# Patient Record
Sex: Male | Born: 1940 | Race: White | Hispanic: No | Marital: Married | Smoking: Former smoker
Health system: Southern US, Community
[De-identification: ages and names within clinical notes are randomized; demographics above are authoritative.]

## PROBLEM LIST (undated history)

## (undated) DIAGNOSIS — K219 Gastro-esophageal reflux disease without esophagitis: Secondary | ICD-10-CM

## (undated) DIAGNOSIS — R0989 Other specified symptoms and signs involving the circulatory and respiratory systems: Secondary | ICD-10-CM

## (undated) DIAGNOSIS — I2510 Atherosclerotic heart disease of native coronary artery without angina pectoris: Principal | ICD-10-CM

## (undated) DIAGNOSIS — Z9289 Personal history of other medical treatment: Secondary | ICD-10-CM

## (undated) DIAGNOSIS — I252 Old myocardial infarction: Secondary | ICD-10-CM

## (undated) DIAGNOSIS — H269 Unspecified cataract: Secondary | ICD-10-CM

## (undated) DIAGNOSIS — E785 Hyperlipidemia, unspecified: Secondary | ICD-10-CM

## (undated) DIAGNOSIS — M1990 Unspecified osteoarthritis, unspecified site: Secondary | ICD-10-CM

## (undated) DIAGNOSIS — C801 Malignant (primary) neoplasm, unspecified: Secondary | ICD-10-CM

## (undated) DIAGNOSIS — K635 Polyp of colon: Secondary | ICD-10-CM

## (undated) DIAGNOSIS — J45909 Unspecified asthma, uncomplicated: Secondary | ICD-10-CM

## (undated) DIAGNOSIS — K222 Esophageal obstruction: Secondary | ICD-10-CM

## (undated) DIAGNOSIS — K649 Unspecified hemorrhoids: Secondary | ICD-10-CM

## (undated) DIAGNOSIS — I10 Essential (primary) hypertension: Secondary | ICD-10-CM

## (undated) DIAGNOSIS — I219 Acute myocardial infarction, unspecified: Secondary | ICD-10-CM

## (undated) HISTORY — DX: Unspecified asthma, uncomplicated: J45.909

## (undated) HISTORY — PX: LUNG SURGERY: SHX703

## (undated) HISTORY — PX: POLYPECTOMY: SHX149

## (undated) HISTORY — DX: Acute myocardial infarction, unspecified: I21.9

## (undated) HISTORY — DX: Atherosclerotic heart disease of native coronary artery without angina pectoris: I25.10

## (undated) HISTORY — DX: Old myocardial infarction: I25.2

## (undated) HISTORY — DX: Esophageal obstruction: K22.2

## (undated) HISTORY — DX: Unspecified hemorrhoids: K64.9

## (undated) HISTORY — DX: Unspecified cataract: H26.9

## (undated) HISTORY — DX: Polyp of colon: K63.5

## (undated) HISTORY — PX: ANGIOPLASTY: SHX39

## (undated) HISTORY — DX: Hyperlipidemia, unspecified: E78.5

## (undated) HISTORY — PX: COLONOSCOPY: SHX174

## (undated) HISTORY — DX: Other specified symptoms and signs involving the circulatory and respiratory systems: R09.89

## (undated) HISTORY — DX: Gastro-esophageal reflux disease without esophagitis: K21.9

## (undated) HISTORY — DX: Personal history of other medical treatment: Z92.89

---

## 1997-07-07 DIAGNOSIS — I2510 Atherosclerotic heart disease of native coronary artery without angina pectoris: Secondary | ICD-10-CM

## 1997-07-07 HISTORY — DX: Atherosclerotic heart disease of native coronary artery without angina pectoris: I25.10

## 1998-02-18 ENCOUNTER — Inpatient Hospital Stay (HOSPITAL_COMMUNITY): Admission: EM | Admit: 1998-02-18 | Discharge: 1998-02-26 | Payer: Self-pay | Admitting: *Deleted

## 1998-02-18 DIAGNOSIS — I219 Acute myocardial infarction, unspecified: Secondary | ICD-10-CM

## 1998-02-18 DIAGNOSIS — I252 Old myocardial infarction: Secondary | ICD-10-CM

## 1998-02-18 HISTORY — PX: CORONARY ANGIOPLASTY WITH STENT PLACEMENT: SHX49

## 1998-02-18 HISTORY — DX: Acute myocardial infarction, unspecified: I21.9

## 1998-02-18 HISTORY — DX: Old myocardial infarction: I25.2

## 2004-05-01 ENCOUNTER — Encounter: Payer: Self-pay | Admitting: Internal Medicine

## 2005-06-18 ENCOUNTER — Ambulatory Visit: Payer: Self-pay | Admitting: Internal Medicine

## 2006-07-07 DIAGNOSIS — Z9289 Personal history of other medical treatment: Secondary | ICD-10-CM

## 2006-07-07 HISTORY — DX: Personal history of other medical treatment: Z92.89

## 2006-07-27 ENCOUNTER — Ambulatory Visit: Payer: Self-pay | Admitting: Internal Medicine

## 2007-03-16 ENCOUNTER — Emergency Department (HOSPITAL_COMMUNITY): Admission: EM | Admit: 2007-03-16 | Discharge: 2007-03-16 | Payer: Self-pay | Admitting: Emergency Medicine

## 2007-09-22 ENCOUNTER — Ambulatory Visit: Payer: Self-pay | Admitting: Internal Medicine

## 2008-08-04 ENCOUNTER — Encounter: Payer: Self-pay | Admitting: Pulmonary Disease

## 2008-09-04 DIAGNOSIS — K219 Gastro-esophageal reflux disease without esophagitis: Secondary | ICD-10-CM

## 2008-09-04 DIAGNOSIS — K222 Esophageal obstruction: Secondary | ICD-10-CM

## 2008-09-04 DIAGNOSIS — D126 Benign neoplasm of colon, unspecified: Secondary | ICD-10-CM

## 2008-09-04 DIAGNOSIS — K648 Other hemorrhoids: Secondary | ICD-10-CM | POA: Insufficient documentation

## 2008-09-05 ENCOUNTER — Ambulatory Visit: Payer: Self-pay | Admitting: Internal Medicine

## 2008-09-05 DIAGNOSIS — Z860100 Personal history of colon polyps, unspecified: Secondary | ICD-10-CM | POA: Insufficient documentation

## 2008-09-13 ENCOUNTER — Encounter: Payer: Self-pay | Admitting: Pulmonary Disease

## 2008-09-21 ENCOUNTER — Ambulatory Visit: Payer: Self-pay | Admitting: Internal Medicine

## 2008-09-21 ENCOUNTER — Encounter: Payer: Self-pay | Admitting: Internal Medicine

## 2008-09-25 ENCOUNTER — Encounter: Payer: Self-pay | Admitting: Internal Medicine

## 2008-10-10 ENCOUNTER — Ambulatory Visit: Payer: Self-pay | Admitting: Pulmonary Disease

## 2008-10-10 DIAGNOSIS — I219 Acute myocardial infarction, unspecified: Secondary | ICD-10-CM

## 2008-10-10 DIAGNOSIS — I10 Essential (primary) hypertension: Secondary | ICD-10-CM

## 2008-10-10 DIAGNOSIS — E785 Hyperlipidemia, unspecified: Secondary | ICD-10-CM | POA: Insufficient documentation

## 2008-10-16 ENCOUNTER — Ambulatory Visit (HOSPITAL_COMMUNITY): Admission: RE | Admit: 2008-10-16 | Discharge: 2008-10-16 | Payer: Self-pay | Admitting: Pulmonary Disease

## 2008-10-23 ENCOUNTER — Ambulatory Visit: Payer: Self-pay | Admitting: Pulmonary Disease

## 2008-10-23 ENCOUNTER — Telehealth: Payer: Self-pay | Admitting: Gastroenterology

## 2008-10-27 ENCOUNTER — Telehealth (INDEPENDENT_AMBULATORY_CARE_PROVIDER_SITE_OTHER): Payer: Self-pay | Admitting: *Deleted

## 2008-10-27 ENCOUNTER — Encounter: Payer: Self-pay | Admitting: Gastroenterology

## 2008-10-27 ENCOUNTER — Ambulatory Visit: Payer: Self-pay | Admitting: Pulmonary Disease

## 2008-10-27 DIAGNOSIS — J4489 Other specified chronic obstructive pulmonary disease: Secondary | ICD-10-CM | POA: Insufficient documentation

## 2008-10-27 DIAGNOSIS — J449 Chronic obstructive pulmonary disease, unspecified: Secondary | ICD-10-CM | POA: Insufficient documentation

## 2008-11-13 ENCOUNTER — Ambulatory Visit: Payer: Self-pay | Admitting: Gastroenterology

## 2008-11-16 ENCOUNTER — Ambulatory Visit (HOSPITAL_COMMUNITY): Admission: RE | Admit: 2008-11-16 | Discharge: 2008-11-16 | Payer: Self-pay | Admitting: Gastroenterology

## 2008-11-16 ENCOUNTER — Encounter: Payer: Self-pay | Admitting: Gastroenterology

## 2008-11-16 ENCOUNTER — Ambulatory Visit: Payer: Self-pay | Admitting: Gastroenterology

## 2008-11-20 ENCOUNTER — Ambulatory Visit: Payer: Self-pay | Admitting: Internal Medicine

## 2008-11-23 ENCOUNTER — Ambulatory Visit: Payer: Self-pay | Admitting: Internal Medicine

## 2008-11-23 ENCOUNTER — Encounter: Payer: Self-pay | Admitting: Pulmonary Disease

## 2008-11-24 LAB — COMPREHENSIVE METABOLIC PANEL
ALT: 18 U/L (ref 0–53)
CO2: 18 mEq/L — ABNORMAL LOW (ref 19–32)
Calcium: 9.2 mg/dL (ref 8.4–10.5)
Chloride: 107 mEq/L (ref 96–112)
Potassium: 3.5 mEq/L (ref 3.5–5.3)
Sodium: 140 mEq/L (ref 135–145)
Total Protein: 7.5 g/dL (ref 6.0–8.3)

## 2008-11-24 LAB — CBC WITH DIFFERENTIAL/PLATELET
BASO%: 0.1 % (ref 0.0–2.0)
HCT: 40.5 % (ref 38.4–49.9)
MCHC: 34.3 g/dL (ref 32.0–36.0)
MONO#: 0.4 10*3/uL (ref 0.1–0.9)
RBC: 4.75 10*6/uL (ref 4.20–5.82)
WBC: 4.2 10*3/uL (ref 4.0–10.3)
lymph#: 1.5 10*3/uL (ref 0.9–3.3)

## 2008-11-26 ENCOUNTER — Ambulatory Visit (HOSPITAL_COMMUNITY): Admission: RE | Admit: 2008-11-26 | Discharge: 2008-11-26 | Payer: Self-pay | Admitting: Internal Medicine

## 2008-12-05 ENCOUNTER — Ambulatory Visit: Payer: Self-pay | Admitting: Pulmonary Disease

## 2008-12-05 DIAGNOSIS — C342 Malignant neoplasm of middle lobe, bronchus or lung: Secondary | ICD-10-CM | POA: Insufficient documentation

## 2008-12-06 LAB — COMPREHENSIVE METABOLIC PANEL
Albumin: 4 g/dL (ref 3.5–5.2)
CO2: 25 mEq/L (ref 19–32)
Glucose, Bld: 107 mg/dL — ABNORMAL HIGH (ref 70–99)
Sodium: 135 mEq/L (ref 135–145)
Total Bilirubin: 0.9 mg/dL (ref 0.3–1.2)
Total Protein: 7 g/dL (ref 6.0–8.3)

## 2008-12-06 LAB — CBC WITH DIFFERENTIAL/PLATELET
Basophils Absolute: 0 10*3/uL (ref 0.0–0.1)
Eosinophils Absolute: 0.1 10*3/uL (ref 0.0–0.5)
HCT: 37.3 % — ABNORMAL LOW (ref 38.4–49.9)
HGB: 12.9 g/dL — ABNORMAL LOW (ref 13.0–17.1)
LYMPH%: 35.7 % (ref 14.0–49.0)
MONO#: 0.3 10*3/uL (ref 0.1–0.9)
NEUT#: 2.3 10*3/uL (ref 1.5–6.5)
Platelets: 169 10*3/uL (ref 140–400)
RBC: 4.42 10*6/uL (ref 4.20–5.82)
WBC: 4.4 10*3/uL (ref 4.0–10.3)

## 2008-12-13 LAB — CBC WITH DIFFERENTIAL/PLATELET
Eosinophils Absolute: 0 10*3/uL (ref 0.0–0.5)
MONO#: 0.4 10*3/uL (ref 0.1–0.9)
NEUT#: 0.9 10*3/uL — ABNORMAL LOW (ref 1.5–6.5)
RBC: 4.39 10*6/uL (ref 4.20–5.82)
RDW: 13.5 % (ref 11.0–14.6)
WBC: 2.2 10*3/uL — ABNORMAL LOW (ref 4.0–10.3)

## 2008-12-13 LAB — COMPREHENSIVE METABOLIC PANEL
Albumin: 4.2 g/dL (ref 3.5–5.2)
CO2: 27 mEq/L (ref 19–32)
Glucose, Bld: 108 mg/dL — ABNORMAL HIGH (ref 70–99)
Potassium: 4.4 mEq/L (ref 3.5–5.3)
Sodium: 140 mEq/L (ref 135–145)
Total Protein: 7.3 g/dL (ref 6.0–8.3)

## 2008-12-20 ENCOUNTER — Encounter: Payer: Self-pay | Admitting: Pulmonary Disease

## 2008-12-20 LAB — CBC WITH DIFFERENTIAL/PLATELET
BASO%: 0.1 % (ref 0.0–2.0)
Eosinophils Absolute: 0 10*3/uL (ref 0.0–0.5)
MCV: 83.8 fL (ref 79.3–98.0)
MONO#: 0.5 10*3/uL (ref 0.1–0.9)
MONO%: 6.3 % (ref 0.0–14.0)
NEUT#: 6.3 10*3/uL (ref 1.5–6.5)
RBC: 4.21 10*6/uL (ref 4.20–5.82)
RDW: 14.4 % (ref 11.0–14.6)
WBC: 7.9 10*3/uL (ref 4.0–10.3)

## 2008-12-20 LAB — COMPREHENSIVE METABOLIC PANEL
ALT: 19 U/L (ref 0–53)
AST: 15 U/L (ref 0–37)
CO2: 19 mEq/L (ref 19–32)
Chloride: 107 mEq/L (ref 96–112)
Creatinine, Ser: 0.89 mg/dL (ref 0.40–1.50)
Sodium: 138 mEq/L (ref 135–145)
Total Bilirubin: 0.4 mg/dL (ref 0.3–1.2)
Total Protein: 7.1 g/dL (ref 6.0–8.3)

## 2008-12-20 LAB — TECHNOLOGIST REVIEW

## 2008-12-27 LAB — COMPREHENSIVE METABOLIC PANEL
ALT: 26 U/L (ref 0–53)
AST: 30 U/L (ref 0–37)
Alkaline Phosphatase: 74 U/L (ref 39–117)
CO2: 19 mEq/L (ref 19–32)
Creatinine, Ser: 2.33 mg/dL — ABNORMAL HIGH (ref 0.40–1.50)
Sodium: 136 mEq/L (ref 135–145)
Total Bilirubin: 1.1 mg/dL (ref 0.3–1.2)
Total Protein: 7.2 g/dL (ref 6.0–8.3)

## 2008-12-27 LAB — CBC WITH DIFFERENTIAL/PLATELET
BASO%: 0.8 % (ref 0.0–2.0)
EOS%: 0.7 % (ref 0.0–7.0)
HCT: 34.1 % — ABNORMAL LOW (ref 38.4–49.9)
LYMPH%: 46.5 % (ref 14.0–49.0)
MCH: 30.8 pg (ref 27.2–33.4)
MCHC: 36.6 g/dL — ABNORMAL HIGH (ref 32.0–36.0)
MCV: 84.2 fL (ref 79.3–98.0)
MONO%: 6.2 % (ref 0.0–14.0)
NEUT%: 45.8 % (ref 39.0–75.0)
Platelets: 140 10*3/uL (ref 140–400)
RBC: 4.05 10*6/uL — ABNORMAL LOW (ref 4.20–5.82)
WBC: 2.9 10*3/uL — ABNORMAL LOW (ref 4.0–10.3)

## 2008-12-28 ENCOUNTER — Encounter: Payer: Self-pay | Admitting: Pulmonary Disease

## 2008-12-29 LAB — COMPREHENSIVE METABOLIC PANEL
BUN: 22 mg/dL (ref 6–23)
CO2: 23 mEq/L (ref 19–32)
Glucose, Bld: 121 mg/dL — ABNORMAL HIGH (ref 70–99)
Sodium: 137 mEq/L (ref 135–145)
Total Bilirubin: 1.1 mg/dL (ref 0.3–1.2)
Total Protein: 6.4 g/dL (ref 6.0–8.3)

## 2009-01-03 ENCOUNTER — Ambulatory Visit: Payer: Self-pay | Admitting: Internal Medicine

## 2009-01-03 LAB — CBC WITH DIFFERENTIAL/PLATELET
Basophils Absolute: 0 10*3/uL (ref 0.0–0.1)
Eosinophils Absolute: 0.1 10*3/uL (ref 0.0–0.5)
HGB: 10.5 g/dL — ABNORMAL LOW (ref 13.0–17.1)
LYMPH%: 60.2 % — ABNORMAL HIGH (ref 14.0–49.0)
MONO#: 0.3 10*3/uL (ref 0.1–0.9)
NEUT#: 0.4 10*3/uL — CL (ref 1.5–6.5)
Platelets: 79 10*3/uL — ABNORMAL LOW (ref 140–400)
RBC: 3.63 10*6/uL — ABNORMAL LOW (ref 4.20–5.82)
RDW: 14.3 % (ref 11.0–14.6)
WBC: 1.8 10*3/uL — ABNORMAL LOW (ref 4.0–10.3)
nRBC: 0 % (ref 0–0)

## 2009-01-03 LAB — COMPREHENSIVE METABOLIC PANEL
ALT: 27 U/L (ref 0–53)
CO2: 24 mEq/L (ref 19–32)
Calcium: 8.5 mg/dL (ref 8.4–10.5)
Chloride: 104 mEq/L (ref 96–112)
Creatinine, Ser: 1.04 mg/dL (ref 0.40–1.50)
Total Protein: 6.3 g/dL (ref 6.0–8.3)

## 2009-01-10 LAB — COMPREHENSIVE METABOLIC PANEL
ALT: 21 U/L (ref 0–53)
AST: 17 U/L (ref 0–37)
Albumin: 4.4 g/dL (ref 3.5–5.2)
Calcium: 9.5 mg/dL (ref 8.4–10.5)
Chloride: 105 mEq/L (ref 96–112)
Potassium: 3.9 mEq/L (ref 3.5–5.3)

## 2009-01-10 LAB — CBC WITH DIFFERENTIAL/PLATELET
BASO%: 0.1 % (ref 0.0–2.0)
MCHC: 34.5 g/dL (ref 32.0–36.0)
MONO#: 0.8 10*3/uL (ref 0.1–0.9)
RBC: 3.75 10*6/uL — ABNORMAL LOW (ref 4.20–5.82)
RDW: 15.5 % — ABNORMAL HIGH (ref 11.0–14.6)
WBC: 7.3 10*3/uL (ref 4.0–10.3)
lymph#: 0.9 10*3/uL (ref 0.9–3.3)

## 2009-01-10 LAB — TECHNOLOGIST REVIEW

## 2009-01-17 LAB — COMPREHENSIVE METABOLIC PANEL
ALT: 17 U/L (ref 0–53)
AST: 17 U/L (ref 0–37)
Alkaline Phosphatase: 73 U/L (ref 39–117)
Calcium: 9.5 mg/dL (ref 8.4–10.5)
Chloride: 101 mEq/L (ref 96–112)
Creatinine, Ser: 1.72 mg/dL — ABNORMAL HIGH (ref 0.40–1.50)
Total Bilirubin: 1 mg/dL (ref 0.3–1.2)

## 2009-01-17 LAB — CBC WITH DIFFERENTIAL/PLATELET
BASO%: 0.8 % (ref 0.0–2.0)
EOS%: 0.4 % (ref 0.0–7.0)
HCT: 32.5 % — ABNORMAL LOW (ref 38.4–49.9)
MCH: 30.4 pg (ref 27.2–33.4)
MCHC: 35.6 g/dL (ref 32.0–36.0)
MCV: 85.6 fL (ref 79.3–98.0)
MONO%: 6.6 % (ref 0.0–14.0)
NEUT%: 46.7 % (ref 39.0–75.0)
lymph#: 1.2 10*3/uL (ref 0.9–3.3)

## 2009-01-24 LAB — COMPREHENSIVE METABOLIC PANEL
ALT: 16 U/L (ref 0–53)
AST: 16 U/L (ref 0–37)
BUN: 17 mg/dL (ref 6–23)
Calcium: 9.1 mg/dL (ref 8.4–10.5)
Creatinine, Ser: 1.31 mg/dL (ref 0.40–1.50)
Total Bilirubin: 0.6 mg/dL (ref 0.3–1.2)

## 2009-01-24 LAB — CBC WITH DIFFERENTIAL/PLATELET
BASO%: 0.3 % (ref 0.0–2.0)
Basophils Absolute: 0 10*3/uL (ref 0.0–0.1)
EOS%: 1.7 % (ref 0.0–7.0)
HCT: 29.6 % — ABNORMAL LOW (ref 38.4–49.9)
HGB: 10.4 g/dL — ABNORMAL LOW (ref 13.0–17.1)
LYMPH%: 58.1 % — ABNORMAL HIGH (ref 14.0–49.0)
MCH: 30.7 pg (ref 27.2–33.4)
MCHC: 35.3 g/dL (ref 32.0–36.0)
MCV: 86.9 fL (ref 79.3–98.0)
MONO%: 10.4 % (ref 0.0–14.0)
NEUT%: 29.5 % — ABNORMAL LOW (ref 39.0–75.0)
Platelets: 66 10*3/uL — ABNORMAL LOW (ref 140–400)

## 2009-01-26 ENCOUNTER — Ambulatory Visit (HOSPITAL_COMMUNITY): Admission: RE | Admit: 2009-01-26 | Discharge: 2009-01-26 | Payer: Self-pay | Admitting: Internal Medicine

## 2009-01-26 ENCOUNTER — Ambulatory Visit: Payer: Self-pay | Admitting: Internal Medicine

## 2009-01-30 ENCOUNTER — Encounter: Payer: Self-pay | Admitting: Pulmonary Disease

## 2009-01-30 LAB — CBC WITH DIFFERENTIAL/PLATELET
BASO%: 0.5 % (ref 0.0–2.0)
Basophils Absolute: 0 10*3/uL (ref 0.0–0.1)
EOS%: 1 % (ref 0.0–7.0)
HCT: 30.4 % — ABNORMAL LOW (ref 38.4–49.9)
HGB: 10.6 g/dL — ABNORMAL LOW (ref 13.0–17.1)
LYMPH%: 28.9 % (ref 14.0–49.0)
MCH: 30.6 pg (ref 27.2–33.4)
MCHC: 34.8 g/dL (ref 32.0–36.0)
MONO#: 0.2 10*3/uL (ref 0.1–0.9)
NEUT%: 58.5 % (ref 39.0–75.0)
Platelets: 176 10*3/uL (ref 140–400)
lymph#: 0.6 10*3/uL — ABNORMAL LOW (ref 0.9–3.3)

## 2009-01-30 LAB — COMPREHENSIVE METABOLIC PANEL
BUN: 18 mg/dL (ref 6–23)
CO2: 22 mEq/L (ref 19–32)
Calcium: 9.5 mg/dL (ref 8.4–10.5)
Chloride: 105 mEq/L (ref 96–112)
Creatinine, Ser: 1.13 mg/dL (ref 0.40–1.50)
Total Bilirubin: 0.5 mg/dL (ref 0.3–1.2)

## 2009-02-02 ENCOUNTER — Ambulatory Visit (HOSPITAL_COMMUNITY): Admission: RE | Admit: 2009-02-02 | Discharge: 2009-02-02 | Payer: Self-pay | Admitting: Internal Medicine

## 2009-02-07 ENCOUNTER — Encounter: Payer: Self-pay | Admitting: Pulmonary Disease

## 2009-02-07 LAB — CBC WITH DIFFERENTIAL/PLATELET
BASO%: 0.7 % (ref 0.0–2.0)
HCT: 31.2 % — ABNORMAL LOW (ref 38.4–49.9)
MCHC: 34.9 g/dL (ref 32.0–36.0)
MONO#: 0.7 10*3/uL (ref 0.1–0.9)
NEUT%: 53.9 % (ref 39.0–75.0)
WBC: 4.1 10*3/uL (ref 4.0–10.3)
lymph#: 1.2 10*3/uL (ref 0.9–3.3)

## 2009-02-07 LAB — COMPREHENSIVE METABOLIC PANEL
ALT: 13 U/L (ref 0–53)
Albumin: 4.1 g/dL (ref 3.5–5.2)
CO2: 21 mEq/L (ref 19–32)
Calcium: 9.2 mg/dL (ref 8.4–10.5)
Chloride: 106 mEq/L (ref 96–112)
Creatinine, Ser: 1.11 mg/dL (ref 0.40–1.50)
Sodium: 138 mEq/L (ref 135–145)
Total Protein: 6.9 g/dL (ref 6.0–8.3)

## 2009-02-13 ENCOUNTER — Ambulatory Visit: Payer: Self-pay | Admitting: Thoracic Surgery

## 2009-03-05 ENCOUNTER — Encounter: Payer: Self-pay | Admitting: Thoracic Surgery

## 2009-03-05 ENCOUNTER — Inpatient Hospital Stay (HOSPITAL_COMMUNITY): Admission: RE | Admit: 2009-03-05 | Discharge: 2009-03-09 | Payer: Self-pay | Admitting: Thoracic Surgery

## 2009-03-05 ENCOUNTER — Ambulatory Visit: Payer: Self-pay | Admitting: Thoracic Surgery

## 2009-03-14 ENCOUNTER — Ambulatory Visit: Payer: Self-pay | Admitting: Thoracic Surgery

## 2009-03-14 ENCOUNTER — Encounter: Admission: RE | Admit: 2009-03-14 | Discharge: 2009-03-14 | Payer: Self-pay | Admitting: Thoracic Surgery

## 2009-04-02 ENCOUNTER — Ambulatory Visit: Payer: Self-pay | Admitting: Internal Medicine

## 2009-04-04 ENCOUNTER — Ambulatory Visit: Payer: Self-pay | Admitting: Thoracic Surgery

## 2009-04-04 ENCOUNTER — Encounter: Payer: Self-pay | Admitting: Pulmonary Disease

## 2009-04-04 ENCOUNTER — Encounter: Admission: RE | Admit: 2009-04-04 | Discharge: 2009-04-04 | Payer: Self-pay | Admitting: Thoracic Surgery

## 2009-04-04 LAB — COMPREHENSIVE METABOLIC PANEL
ALT: 11 U/L (ref 0–53)
BUN: 15 mg/dL (ref 6–23)
CO2: 27 mEq/L (ref 19–32)
Calcium: 9.4 mg/dL (ref 8.4–10.5)
Chloride: 103 mEq/L (ref 96–112)
Creatinine, Ser: 1.06 mg/dL (ref 0.40–1.50)
Glucose, Bld: 103 mg/dL — ABNORMAL HIGH (ref 70–99)

## 2009-04-04 LAB — CBC WITH DIFFERENTIAL/PLATELET
BASO%: 0.8 % (ref 0.0–2.0)
Basophils Absolute: 0 10e3/uL (ref 0.0–0.1)
EOS%: 5.4 % (ref 0.0–7.0)
Eosinophils Absolute: 0.2 10e3/uL (ref 0.0–0.5)
HCT: 31.9 % — ABNORMAL LOW (ref 38.4–49.9)
HGB: 10.9 g/dL — ABNORMAL LOW (ref 13.0–17.1)
LYMPH%: 31.1 % (ref 14.0–49.0)
MCH: 30.6 pg (ref 27.2–33.4)
MCHC: 34 g/dL (ref 32.0–36.0)
MCV: 89.9 fL (ref 79.3–98.0)
MONO#: 0.5 10e3/uL (ref 0.1–0.9)
MONO%: 15.1 % — ABNORMAL HIGH (ref 0.0–14.0)
NEUT#: 1.5 10e3/uL (ref 1.5–6.5)
NEUT%: 47.6 % (ref 39.0–75.0)
Platelets: 180 10e3/uL (ref 140–400)
RBC: 3.55 10e6/uL — ABNORMAL LOW (ref 4.20–5.82)
RDW: 15.2 % — ABNORMAL HIGH (ref 11.0–14.6)
WBC: 3.2 10e3/uL — ABNORMAL LOW (ref 4.0–10.3)
lymph#: 1 10e3/uL (ref 0.9–3.3)

## 2009-05-16 ENCOUNTER — Ambulatory Visit: Payer: Self-pay | Admitting: Thoracic Surgery

## 2009-05-16 ENCOUNTER — Encounter: Admission: RE | Admit: 2009-05-16 | Discharge: 2009-05-16 | Payer: Self-pay | Admitting: Thoracic Surgery

## 2009-06-27 ENCOUNTER — Ambulatory Visit: Payer: Self-pay | Admitting: Internal Medicine

## 2009-07-02 ENCOUNTER — Ambulatory Visit (HOSPITAL_COMMUNITY): Admission: RE | Admit: 2009-07-02 | Discharge: 2009-07-02 | Payer: Self-pay | Admitting: Internal Medicine

## 2009-07-02 LAB — CBC WITH DIFFERENTIAL/PLATELET
Basophils Absolute: 0 10*3/uL (ref 0.0–0.1)
EOS%: 3.7 % (ref 0.0–7.0)
HCT: 39.8 % (ref 38.4–49.9)
HGB: 13.4 g/dL (ref 13.0–17.1)
LYMPH%: 33.6 % (ref 14.0–49.0)
MCH: 28.6 pg (ref 27.2–33.4)
MONO#: 0.5 10*3/uL (ref 0.1–0.9)
NEUT%: 50.3 % (ref 39.0–75.0)
Platelets: 176 10*3/uL (ref 140–400)
lymph#: 1.4 10*3/uL (ref 0.9–3.3)

## 2009-07-02 LAB — COMPREHENSIVE METABOLIC PANEL
BUN: 14 mg/dL (ref 6–23)
CO2: 26 mEq/L (ref 19–32)
Calcium: 9.5 mg/dL (ref 8.4–10.5)
Chloride: 102 mEq/L (ref 96–112)
Creatinine, Ser: 1.19 mg/dL (ref 0.40–1.50)
Total Bilirubin: 0.6 mg/dL (ref 0.3–1.2)

## 2009-07-04 ENCOUNTER — Encounter: Payer: Self-pay | Admitting: Pulmonary Disease

## 2009-08-15 ENCOUNTER — Encounter: Admission: RE | Admit: 2009-08-15 | Discharge: 2009-08-15 | Payer: Self-pay | Admitting: Thoracic Surgery

## 2009-08-15 ENCOUNTER — Ambulatory Visit: Payer: Self-pay | Admitting: Thoracic Surgery

## 2009-09-05 ENCOUNTER — Ambulatory Visit: Payer: Self-pay | Admitting: Internal Medicine

## 2009-09-26 ENCOUNTER — Ambulatory Visit: Payer: Self-pay | Admitting: Internal Medicine

## 2009-09-28 ENCOUNTER — Ambulatory Visit (HOSPITAL_COMMUNITY): Admission: RE | Admit: 2009-09-28 | Discharge: 2009-09-28 | Payer: Self-pay | Admitting: Internal Medicine

## 2009-09-28 LAB — CBC WITH DIFFERENTIAL/PLATELET
BASO%: 0.9 % (ref 0.0–2.0)
EOS%: 3.5 % (ref 0.0–7.0)
MCH: 30.4 pg (ref 27.2–33.4)
MCHC: 34.7 g/dL (ref 32.0–36.0)
NEUT%: 53.6 % (ref 39.0–75.0)
RBC: 4.53 10*6/uL (ref 4.20–5.82)
RDW: 13.6 % (ref 11.0–14.6)
WBC: 4.4 10*3/uL (ref 4.0–10.3)
lymph#: 1.3 10*3/uL (ref 0.9–3.3)

## 2009-09-28 LAB — COMPREHENSIVE METABOLIC PANEL
ALT: 26 U/L (ref 0–53)
AST: 26 U/L (ref 0–37)
Calcium: 9.5 mg/dL (ref 8.4–10.5)
Chloride: 105 mEq/L (ref 96–112)
Creatinine, Ser: 1.05 mg/dL (ref 0.40–1.50)
Sodium: 140 mEq/L (ref 135–145)
Total Protein: 7.9 g/dL (ref 6.0–8.3)

## 2009-10-01 ENCOUNTER — Encounter: Payer: Self-pay | Admitting: Pulmonary Disease

## 2009-12-26 ENCOUNTER — Ambulatory Visit: Payer: Self-pay | Admitting: Internal Medicine

## 2009-12-28 ENCOUNTER — Ambulatory Visit (HOSPITAL_COMMUNITY): Admission: RE | Admit: 2009-12-28 | Discharge: 2009-12-28 | Payer: Self-pay | Admitting: Internal Medicine

## 2009-12-28 LAB — COMPREHENSIVE METABOLIC PANEL
ALT: 20 U/L (ref 0–53)
AST: 24 U/L (ref 0–37)
Albumin: 4.1 g/dL (ref 3.5–5.2)
CO2: 25 mEq/L (ref 19–32)
Calcium: 9.2 mg/dL (ref 8.4–10.5)
Chloride: 106 mEq/L (ref 96–112)
Potassium: 3.8 mEq/L (ref 3.5–5.3)
Total Protein: 7.7 g/dL (ref 6.0–8.3)

## 2009-12-28 LAB — CBC WITH DIFFERENTIAL/PLATELET
BASO%: 0.7 % (ref 0.0–2.0)
EOS%: 3.9 % (ref 0.0–7.0)
HCT: 37.9 % — ABNORMAL LOW (ref 38.4–49.9)
HGB: 13.1 g/dL (ref 13.0–17.1)
MCH: 29.8 pg (ref 27.2–33.4)
MCHC: 34.5 g/dL (ref 32.0–36.0)
MONO#: 0.5 10*3/uL (ref 0.1–0.9)
RDW: 13.9 % (ref 11.0–14.6)
WBC: 4.5 10*3/uL (ref 4.0–10.3)
lymph#: 1.3 10*3/uL (ref 0.9–3.3)

## 2010-01-15 ENCOUNTER — Ambulatory Visit: Payer: Self-pay | Admitting: Thoracic Surgery

## 2010-04-26 ENCOUNTER — Ambulatory Visit: Payer: Self-pay | Admitting: Internal Medicine

## 2010-04-30 ENCOUNTER — Ambulatory Visit (HOSPITAL_COMMUNITY): Admission: RE | Admit: 2010-04-30 | Discharge: 2010-04-30 | Payer: Self-pay | Admitting: Internal Medicine

## 2010-04-30 LAB — CBC WITH DIFFERENTIAL/PLATELET
Basophils Absolute: 0 10*3/uL (ref 0.0–0.1)
EOS%: 2.9 % (ref 0.0–7.0)
HCT: 42.3 % (ref 38.4–49.9)
HGB: 14.5 g/dL (ref 13.0–17.1)
MCH: 29.7 pg (ref 27.2–33.4)
MONO#: 0.6 10*3/uL (ref 0.1–0.9)
NEUT#: 2.7 10*3/uL (ref 1.5–6.5)
NEUT%: 51.9 % (ref 39.0–75.0)
RDW: 13.6 % (ref 11.0–14.6)
WBC: 5.3 10*3/uL (ref 4.0–10.3)
lymph#: 1.8 10*3/uL (ref 0.9–3.3)

## 2010-04-30 LAB — COMPREHENSIVE METABOLIC PANEL
ALT: 23 U/L (ref 0–53)
AST: 25 U/L (ref 0–37)
Albumin: 4.2 g/dL (ref 3.5–5.2)
BUN: 17 mg/dL (ref 6–23)
CO2: 27 mEq/L (ref 19–32)
Calcium: 9.5 mg/dL (ref 8.4–10.5)
Chloride: 104 mEq/L (ref 96–112)
Creatinine, Ser: 1.16 mg/dL (ref 0.40–1.50)
Potassium: 4.5 mEq/L (ref 3.5–5.3)

## 2010-07-26 ENCOUNTER — Other Ambulatory Visit: Payer: Self-pay | Admitting: Internal Medicine

## 2010-07-26 DIAGNOSIS — C3490 Malignant neoplasm of unspecified part of unspecified bronchus or lung: Secondary | ICD-10-CM

## 2010-07-28 ENCOUNTER — Encounter: Payer: Self-pay | Admitting: Internal Medicine

## 2010-08-06 NOTE — Letter (Signed)
[facility]   Imported By: Sherian Rein 10/23/2009 15:11:37  _____________________________________________________________________  External Attachment:    Type:   Image     Comment:   External Document

## 2010-08-06 NOTE — Assessment & Plan Note (Signed)
Summary: 1 yr. f/u--ch.    History of Present Illness Visit Type: Follow-up Visit Primary GI MD: Yancey Flemings MD Primary Provider: Rodrigo Ran, MD Requesting Provider: n/a Chief Complaint: Yearly f/u for GERD. Pt states that he is fine and denies any GI complaints  History of Present Illness:   70 year old white male with a history of hypertension, hyperlipidemia, myocardial infarction, GERD with peptic stricture, adenomatous colon polyps, and most recently, non-small cell lung cancer. The patient presents today at 4 annual followup regarding management of his GERD. He was last seen in the office March 2010. For his GERD he is maintained on Nexium 40 mg daily. On medication symptoms control. Off medication significant symptoms. No recurrent dysphagia. In terms of his lung cancer, he was diagnosed this past May. He was treated with chemotherapy followed by VATS procedure. At this point he is felt to be disease free. His last colonoscopy was in March of 2010. Diminutive hyperplastic polyp and internal hemorrhoids noted. Followup in 5 years recommended.   GI Review of Systems    Reports acid reflux.      Denies abdominal pain, belching, bloating, chest pain, dysphagia with liquids, dysphagia with solids, heartburn, loss of appetite, nausea, vomiting, vomiting blood, weight loss, and  weight gain.        Denies anal fissure, black tarry stools, change in bowel habit, constipation, diarrhea, diverticulosis, fecal incontinence, heme positive stool, hemorrhoids, irritable bowel syndrome, jaundice, light color stool, liver problems, rectal bleeding, and  rectal pain.    Current Medications (verified): 1)  Aspirin 325 Mg Tabs (Aspirin) .Marland Kitchen.. 1 Tablet By Mouth Once Daily 2)  Welchol 625 Mg Tabs (Colesevelam Hcl) .... Take 1 Tablet By Mouth Once Daily 3)  Zocor 80 Mg Tabs (Simvastatin) .Marland Kitchen.. 1 Tablet By Mouth Once Daily 4)  Nexium 40 Mg Cpdr (Esomeprazole Magnesium) .Marland Kitchen.. 1 Capsule By Mouth Once Daily 5)   Carvedilol 6.25 Mg Tabs (Carvedilol) .Marland Kitchen.. 1 Tablet By Mouth Two Times A Day 6)  Nitrostat 0.4 Mg Subl (Nitroglycerin) .... Use As Needed 7)  Sudafed Pe Maximum Strength 10 Mg Tabs (Phenylephrine Hcl) .... As Needed For Cold and Sinus  Allergies (verified): 1)  ! Lipitor (Atorvastatin)  Past History:  Past Medical History: Reviewed history from 10/10/2008 and no changes required. 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 No FH of Colon Cancer:  Social History: Reviewed history from 10/10/2008 and no changes required. Patient is a former smoker.   Quit smoking in 1999.  Smoked x 51yrs upto 2ppd.   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    Review of Systems       The patient complains of allergy/sinus.  The patient denies anemia, anxiety-new, arthritis/joint pain, back pain, blood in urine, breast changes/lumps, change in vision, confusion, cough, coughing up blood, depression-new, fainting, fatigue, fever, headaches-new, hearing problems, heart murmur, heart rhythm changes, itching, muscle pains/cramps, night sweats, nosebleeds, shortness of breath, skin rash, sleeping problems, sore throat, swelling of feet/legs, swollen lymph glands, thirst - excessive, urination - excessive, urination changes/pain, urine leakage, vision changes, and voice change.    Vital Signs:  Patient profile:   70 year old male Height:  70 inches Weight:      187 pounds BMI:     26.93 BSA:     2.03 Pulse rate:   64 / minute Pulse rhythm:   regular BP sitting:   122 / 60  (left arm) Cuff  size:   regular  Vitals Entered By: Ok Anis CMA (September 05, 2009 8:44 AM)  Physical Exam  General:  Well developed, well nourished, no acute distress. Head:  Normocephalic and atraumatic. Eyes:  PERRLA, no icterus. Nose:  No deformity, discharge,  or lesions. Mouth:  No deformity or lesions, dentition normal. Lungs:  Clear throughout to auscultation. Heart:  Regular rate and rhythm; no murmurs, rubs,  or bruits. Abdomen:  Soft, nontender and nondistended. No masses, hepatosplenomegaly or hernias noted. Normal bowel sounds. Msk:  Symmetrical with no gross deformities. Normal posture. Pulses:  Normal pulses noted. Extremities:  No clubbing, cyanosis, edema or deformities noted. Neurologic:  Alert and  oriented x4;   Skin:  Intact without significant lesions or rashes. Psych:  Alert and cooperative. Normal mood and affect.   Impression & Recommendations:  Problem # 1:  GERD (ICD-530.81) GERD complicated by peptic stricture. Patient's symptoms remained well controlled on medical therapy.  Plan: #1. Continue Nexium 40 mg daily. A prescription with multiple refills has been submitted #2. Reflux precautions #3. Upper endoscopy p.r.n. recurrent dysphagia #4. Routine office followup in one year  Problem # 2:  PERSONAL HX COLONIC POLYPS (ICD-V12.72) history of adenomatous colon polyps. Colonoscopy last year with hyperplastic polyp only. Followup 2015 as planned.  Patient Instructions: 1)  Nexium 40 mg 1 by mouth once daily #30 x 11 RFs. sent to pharmacy. 2)  The medication list was reviewed and reconciled.  All changed / newly prescribed medications were explained.  A complete medication list was provided to the patient / caregiver. 3)  Copy: Dr. Rodrigo Ran Prescriptions: NEXIUM 40 MG CPDR (ESOMEPRAZOLE MAGNESIUM) 1 capsule by mouth once daily  #30 x 11   Entered by:   Milford Cage NCMA   Authorized by:   Hilarie Fredrickson MD   Signed by:   Milford Cage NCMA on 09/05/2009   Method  used:   Electronically to        [facility]  [address] [facility] [address] [id number]       [address]       [place], [place]  [address]       Ph: [phone]       Fax: [phone]   RxID:   [id number]

## 2010-08-06 NOTE — Letter (Signed)
[facility]   Imported By: Lester  07/24/2009 07:20:21  _____________________________________________________________________  External Attachment:    Type:   Image     Comment:   External Document

## 2010-08-29 ENCOUNTER — Other Ambulatory Visit: Payer: Self-pay | Admitting: Internal Medicine

## 2010-08-29 ENCOUNTER — Ambulatory Visit (HOSPITAL_COMMUNITY)
Admission: RE | Admit: 2010-08-29 | Discharge: 2010-08-29 | Disposition: A | Discharge status: Home / Self Care | Payer: MEDICARE | Source: Ambulatory Visit | Attending: Internal Medicine | Admitting: Internal Medicine

## 2010-08-29 ENCOUNTER — Encounter (HOSPITAL_BASED_OUTPATIENT_CLINIC_OR_DEPARTMENT_OTHER): Payer: MEDICARE | Admitting: Internal Medicine

## 2010-08-29 DIAGNOSIS — C3490 Malignant neoplasm of unspecified part of unspecified bronchus or lung: Secondary | ICD-10-CM | POA: Insufficient documentation

## 2010-08-29 DIAGNOSIS — C342 Malignant neoplasm of middle lobe, bronchus or lung: Secondary | ICD-10-CM

## 2010-08-29 DIAGNOSIS — Z09 Encounter for follow-up examination after completed treatment for conditions other than malignant neoplasm: Secondary | ICD-10-CM | POA: Insufficient documentation

## 2010-08-29 DIAGNOSIS — I2510 Atherosclerotic heart disease of native coronary artery without angina pectoris: Secondary | ICD-10-CM | POA: Insufficient documentation

## 2010-08-29 LAB — CMP (CANCER CENTER ONLY)
ALT(SGPT): 26 U/L (ref 10–47)
AST: 22 U/L (ref 11–38)
Alkaline Phosphatase: 96 U/L — ABNORMAL HIGH (ref 26–84)
CO2: 29 mEq/L (ref 18–33)
Creat: 1.3 mg/dl — ABNORMAL HIGH (ref 0.6–1.2)
Sodium: 142 mEq/L (ref 128–145)
Total Bilirubin: 0.9 mg/dl (ref 0.20–1.60)
Total Protein: 7.6 g/dL (ref 6.4–8.1)

## 2010-08-29 LAB — CBC WITH DIFFERENTIAL/PLATELET
BASO%: 0.8 % (ref 0.0–2.0)
EOS%: 2.9 % (ref 0.0–7.0)
HCT: 41 % (ref 38.4–49.9)
LYMPH%: 31.4 % (ref 14.0–49.0)
MCH: 29.5 pg (ref 27.2–33.4)
MCHC: 34.4 g/dL (ref 32.0–36.0)
NEUT%: 54.6 % (ref 39.0–75.0)
Platelets: 179 10*3/uL (ref 140–400)
lymph#: 1.5 10*3/uL (ref 0.9–3.3)

## 2010-08-29 MED ORDER — IOHEXOL 300 MG/ML  SOLN
80.0000 mL | Freq: Once | INTRAMUSCULAR | Status: AC | PRN
  Administered 2010-08-29: 80 mL via INTRAVENOUS

## 2010-09-03 ENCOUNTER — Other Ambulatory Visit: Payer: Self-pay | Admitting: Internal Medicine

## 2010-09-03 ENCOUNTER — Encounter (HOSPITAL_BASED_OUTPATIENT_CLINIC_OR_DEPARTMENT_OTHER): Payer: MEDICARE | Admitting: Internal Medicine

## 2010-09-03 DIAGNOSIS — C342 Malignant neoplasm of middle lobe, bronchus or lung: Secondary | ICD-10-CM

## 2010-09-03 DIAGNOSIS — C3490 Malignant neoplasm of unspecified part of unspecified bronchus or lung: Secondary | ICD-10-CM

## 2010-10-11 LAB — COMPREHENSIVE METABOLIC PANEL
Albumin: 3.2 g/dL — ABNORMAL LOW (ref 3.5–5.2)
Alkaline Phosphatase: 60 U/L (ref 39–117)
BUN: 12 mg/dL (ref 6–23)
BUN: 17 mg/dL (ref 6–23)
Calcium: 8.8 mg/dL (ref 8.4–10.5)
Chloride: 100 mEq/L (ref 96–112)
Chloride: 103 mEq/L (ref 96–112)
Creatinine, Ser: 1.01 mg/dL (ref 0.4–1.5)
Creatinine, Ser: 1.05 mg/dL (ref 0.4–1.5)
GFR calc Af Amer: >60 mL/min (ref >60)
GFR calc non Af Amer: >60 mL/min (ref >60)
GFR calc non Af Amer: >60 mL/min (ref >60)
Glucose, Bld: 106 mg/dL — ABNORMAL HIGH (ref 70–99)
Potassium: 3.5 mEq/L (ref 3.5–5.1)
Total Bilirubin: 1.5 mg/dL — ABNORMAL HIGH (ref 0.3–1.2)
Total Bilirubin: 1.7 mg/dL — ABNORMAL HIGH (ref 0.3–1.2)

## 2010-10-11 LAB — CBC
HCT: 27.7 % — ABNORMAL LOW (ref 39.0–52.0)
MCHC: 34.4 g/dL (ref 30.0–36.0)
MCV: 93.9 fL (ref 78.0–100.0)
Platelets: 136 10*3/uL — ABNORMAL LOW (ref 150–400)
WBC: 8.7 10*3/uL (ref 4.0–10.5)

## 2010-10-11 LAB — GLUCOSE, CAPILLARY
Glucose-Capillary: 102 mg/dL — ABNORMAL HIGH (ref 70–99)
Glucose-Capillary: 105 mg/dL — ABNORMAL HIGH (ref 70–99)
Glucose-Capillary: 132 mg/dL — ABNORMAL HIGH (ref 70–99)

## 2010-10-12 LAB — CBC
HCT: 30.5 % — ABNORMAL LOW (ref 39.0–52.0)
Hemoglobin: 10.7 g/dL — ABNORMAL LOW (ref 13.0–17.0)
Hemoglobin: 9.4 g/dL — ABNORMAL LOW (ref 13.0–17.0)
MCHC: 35.2 g/dL (ref 30.0–36.0)
MCHC: 35.6 g/dL (ref 30.0–36.0)
MCV: 91.1 fL (ref 78.0–100.0)
MCV: 92.5 fL (ref 78.0–100.0)
RBC: 2.86 MIL/uL — ABNORMAL LOW (ref 4.22–5.81)
RDW: 17.5 % — ABNORMAL HIGH (ref 11.5–15.5)

## 2010-10-12 LAB — POCT I-STAT 3, ART BLOOD GAS (G3+)
O2 Saturation: 93 %
TCO2: 25 mmol/L (ref 0–100)
pCO2 arterial: 36 mmHg (ref 35.0–45.0)

## 2010-10-12 LAB — COMPREHENSIVE METABOLIC PANEL
Alkaline Phosphatase: 72 U/L (ref 39–117)
BUN: 16 mg/dL (ref 6–23)
Glucose, Bld: 98 mg/dL (ref 70–99)
Potassium: 4.1 mEq/L (ref 3.5–5.1)
Total Protein: 7.3 g/dL (ref 6.0–8.3)

## 2010-10-12 LAB — URINALYSIS, ROUTINE W REFLEX MICROSCOPIC
Bilirubin Urine: NEGATIVE
Hgb urine dipstick: NEGATIVE
Protein, ur: NEGATIVE mg/dL
Urobilinogen, UA: 0.2 mg/dL (ref 0.0–1.0)

## 2010-10-12 LAB — TYPE AND SCREEN: ABO/RH(D): O POS

## 2010-10-12 LAB — BASIC METABOLIC PANEL
CO2: 24 mEq/L (ref 19–32)
Chloride: 100 mEq/L (ref 96–112)
GFR calc Af Amer: >60 mL/min (ref >60)
Sodium: 131 mEq/L — ABNORMAL LOW (ref 135–145)

## 2010-10-12 LAB — BLOOD GAS, ARTERIAL
Acid-base deficit: 0.5 mmol/L (ref 0.0–2.0)
Bicarbonate: 23.2 mEq/L (ref 20.0–24.0)
O2 Saturation: 98.5 %
Patient temperature: 98.6
TCO2: 24.3 mmol/L (ref 0–100)

## 2010-10-12 LAB — GLUCOSE, CAPILLARY: Glucose-Capillary: 97 mg/dL (ref 70–99)

## 2010-10-12 LAB — PROTIME-INR
INR: 1 (ref 0.00–1.49)
Prothrombin Time: 13.3 seconds (ref 11.6–15.2)

## 2010-10-16 LAB — GLUCOSE, CAPILLARY: Glucose-Capillary: 119 mg/dL — ABNORMAL HIGH (ref 70–99)

## 2010-11-05 ENCOUNTER — Other Ambulatory Visit: Payer: Self-pay | Admitting: Internal Medicine

## 2010-11-19 NOTE — Discharge Summary (Signed)
Mathew Hall, Mathew Hall NO.:  [id number]   MEDICAL RECORD NO.:  [id number]          PATIENT TYPE:  INP   LOCATION:  2037                         FACILITY:  [facility]   PHYSICIAN:  Mathew Hall, M.D. DATE OF BIRTH:  10/11/1940   DATE OF ADMISSION:  03/05/2009  DATE OF DISCHARGE:                               DISCHARGE SUMMARY   HISTORY:  The patient is a 70 year old ex-smoker referred to Dr. Edwyna Shell  in thoracic surgical consultation.  He has a history of left lower lobe  lesion and right middle lobe lung lesion with possible mediastinal  adenopathy in the subcarinal area.  Diagnosis was made by endoscopy by  Dr. 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  He has subsequently undergone chemotherapy and radiation with  complete resolution of the findings on the left lower lobe as well as  resolution of his subcarinal adenopathy.  He continued to have a  residual lesion in the right middle lobe and was admitted this  hospitalization for right middle lobe lobectomy.   MEDICATIONS:  On admission included the following:  1. Aspirin 325 mg daily.  2. Benicar 20 mg daily.  3. Carvedilol 6.25 mg 1/2 tablet twice daily.  4. Docusate 100 mg daily.  5. Nexium 40 mg daily.  6. Nitroglycerin sublingual p.r.n.  7. WelChol 625 mg twice daily.  8. Zocor 80 mg daily.   ALLERGIES:  He has an intolerance to LIPITOR which causes muscle aches.   PAST MEDICAL HISTORY:  Coronary artery disease, hypertension,  dyslipidemia.   FAMILY HISTORY:  Noncontributory.   SOCIAL HISTORY:  Quit smoking in 1999, pack years not listed per the H  and P.  He does not drink alcohol on a regular basis.   Review of systems and physical exam, please see the history and physical  done at the time of admission.   HOSPITAL COURSE:  The patient was admitted electively and on March 05, 2009 underwent the following procedure:  1. Fiberoptic bronchoscopy.  2.  Mediastinoscopy.  3. Right middle lobe lobectomy.   The patient tolerated the procedures well, was taken to the Post-  Anesthesia Care Unit in stable condition.   POSTOPERATIVE HOSPITAL COURSE:  The patient has progressed nicely.  He  has remained hemodynamically stable.  All routine lines, monitors,  drainage devices have been discontinued in the standard fashion.  He is  tolerating a gradual increase in activity using standard protocols.  The  final pathology report is pending.  The laboratory values revealed  normal BUN and creatinine, 17 and 1.05 on March 08, 2009.  Most  recent hematocrit on postoperative day #1 is listed as 26%.  The patient  has been weaned from oxygen, maintained good saturations on room air.  He is afebrile.  Incision is healing well without evidence of infection.  He is having some difficulties with constipation but this is slowly  improving.  Overall, his status is tentatively felt stable for discharge  on the morning of March 09, 2009 pending morning round reevaluation.   INSTRUCTIONS:  The patient received  written instructions regarding  medications, activity, diet, wound care, and followup.  Followup will  include Dr. Edwyna Shell on March 14, 2009 of 3:15 with a repeat chest x-  ray.   MEDICATIONS:  On discharge are as follows:  1. For pain oxycodone/APAP 5/325 mg 1-2 every 4-6 hours as needed.  He is to continue with his following other medications:  1. Aspirin 325 mg daily.  2. Benicar 20 mg daily.  3. Carvedilol 6.25 mg 1/2 tablet twice daily.  4. Docusate 100 mg daily.  5. Nexium 40 mg daily.  6. Nitroglycerin p.r.n.  7. Welchol 625 mg twice daily.  8. Zocor 80 mg daily.   INSTRUCTIONS:  The patient received written instructions in regard to  medications, activity, diet, wound care, and followup.   CONDITION ON DISCHARGE:  Stable, improving.   FINAL DIAGNOSES:  1. Status post right middle lobe lobectomy, final pathology pending.  2.  History of adenocarcinoma status post previous chemo/radiation      treatments.  3. Other diagnoses include coronary artery disease, hypertension,      dyslipidemia, intolerance to Lipitor, postoperative acute blood      loss anemia.      Rowe Clack, P.A.-C.      Mathew Hall, M.D.  Electronically Signed    WEG/MEDQ  D:  03/08/2009  T:  03/09/2009  Job:  [id number]

## 2010-11-19 NOTE — Assessment & Plan Note (Signed)
[facility]                         GASTROENTEROLOGY OFFICE NOTE   NAME:Mathew Hall, Mathew Hall                      MRN:          [id number]  DATE:09/22/2007                            DOB:          09/02/1940    HISTORY:  [name] presents today for followup.  He is a 70 year old  with a history of coronary artery disease, hyperlipidemia,  gastroesophageal reflux disease, and adenomatous colon polyps.  He was  last evaluated in the office July 27, 2006.  See that dictation for  details.  For his reflux disease, he continues on Nexium 40 mg daily.  On Nexium, he has no problems with indigestion, heartburn or dysphagia.  He is tolerating the medication well, without side effects.  He denies  lower GI complaints, such as bleeding or change in bowel habits.  He  reports that his recent physical examination with Dr. Eloise Hall was  unremarkable.   CURRENT MEDICATIONS INCLUDE:  1. Aspirin 325 mg daily.  2. Welchol 6 tablets daily.  3. Zocor 80 mg daily.  4. Nexium 40 mg daily.  5. Carvedilol 6.25 mg b.i.d.  6. Benicar 20 mg daily.   PHYSICAL EXAM:  Finds a well-appearing male, in no acute distress.  He  is alert and oriented.  Blood pressure is 130/76, heart rate is 72 and regular, weight is 193.8  pounds (no change).  HEENT:  Sclerae are anicteric, conjunctivae are pink.  LUNGS:  Clear.  HEART:  Regular.  ABDOMEN:  Soft without tenderness, mass or hernia.   IMPRESSION:  1. Gastroesophageal reflux disease.  The patient remains asymptomatic      on Nexium 40 mg daily.  2. History of adenomatous colon polyps.  Due for followup in October      of 2010.  3. Multiple general medical problems, under the care of Dr. Jarome Hall.   RECOMMENDATIONS:  1. Continue Nexium 40 mg daily.  A prescription with multiple refills      has been electronically submitted.  2. Surveillance colonoscopy in October 2010 is planned.  3. Ongoing general medical  care with Dr. Eloise Hall.    Wilhemina Bonito. Marina Goodell, MD  Electronically Signed   JNP/MedQ  DD: 09/22/2007  DT: 09/22/2007  Job #: [id number]   cc:   Mathew Hall. Mathew Hall, M.D.

## 2010-11-19 NOTE — Letter (Signed)
January 15, 2010   Lajuana Matte, MD  [phone] N. [address]  [place], [place] [address]   Re:  Mathew Hall, Mathew Hall               DOB:  [birth year]   Dear Dr. Arbutus Ped:   I saw the patient back today and reviewed his CT scan.  He has no  evidence of recurrence.  His next one scheduled in October.  He is doing  well overall.  His blood pressure is 142/84, pulse 60, respirations were  16, saturations were 98%.  I will plan to see him back again.  I will  let you follow him from now on.  I will be happy to see him again if he  develops any future problems.   Ines Bloomer, M.D.  Electronically Signed   DPB/MEDQ  D:  01/15/2010  T:  01/16/2010  Job:  [id number]

## 2010-11-19 NOTE — H&P (Signed)
Mathew Hall, Mathew Hall               ACCOUNT NO.:  [id number]   MEDICAL RECORD NO.:  [id number]          PATIENT TYPE:  INP   LOCATION:  NA                           FACILITY:  [facility]   PHYSICIAN:  Ines Bloomer, M.D. DATE OF BIRTH:  Nov 01, 1940   DATE OF ADMISSION:  DATE OF DISCHARGE:                              HISTORY & PHYSICAL   CHIEF COMPLAINT:  Lung mass.   HISTORY OF PRESENT ILLNESS:  This 70 year old ex-smoker was found to  have a left lower lobe lesion and right middle lobe lesion, question  mediastinal adenopathy in the subcarinal area.  Diagnosis was made of  adenocarcinoma by endoscopy by Dr. Christella Hartigan of subcarinal area.  He  underwent chemotherapy and radiation chemotherapy with complete  resolution of a left lower lobe lesion and resolution of his subcarinal  adenopathy, which turned from positive to negative.  He still has a  residual lesion in his right middle lobe.  He has had no hemoptysis,  fever, chills, blood-tinged sputum.  He was treated with carboplatin and  Alimta.  He now is being admitted for a right middle lobectomy.   MEDICATIONS:  1. Aspirin.  2. WelChol 625 mg a day.  3. Zocor 80 mg a day.  4. Nexium 40 mg a day.  5. Carvedilol 6.25 mg b.i.d.  6. Pravachol 20 mg a day.  7. Nitrostat.  8. Amlodipine 5 mg daily.   ALLERGIES:  Lipitor cause him to have muscle aches.   MEDICAL HISTORY:  Positive for cardiac disease, hypertension, and  dyslipidemia.   FAMILY HISTORY:  Noncontributory.   SOCIAL HISTORY:  He is married, has 2 children.  Quit smoking in 1999.  Does not drink alcohol on a regular basis.   REVIEW OF SYSTEMS:  GENERAL:  He is 190 pounds.  He is 5 feet 7 inches.  CARDIAC:  No recent angina or atrial fibrillation.  PULMONARY:  No  hemoptysis. See history of present illness.  GASTROINTESTINAL:  Esophageal stricture with reflux esophagitis.  Did have also adenomas,  colonic polyps, GERD.  GENITOURINARY:  No kidney disease, dysuria,  frequent urination.  VASCULAR:  No claudication, DVT, TIAs.  NEUROLOGIC:  No dizziness, headaches, blackout, or seizure.  MUSCULOSKELETAL:  No  arthritis.  PSYCHIATRIC:  No depression or nervous.  EYE AND ENT:  No  changes in eyesight or hearing.  HEMATOLOGIC:  No problems with  bleeding, clotting disorders, or anemia.  It is mentioned that he did  have in March 2010, he had an angioplasty with 2 stents have replaced.   PHYSICAL EXAMINATION:  VITAL SIGNS:  Blood pressure is 100/62, pulse 78,  respirations 18, and sats are 95%.  GENERAL:  He is a well-developed Caucasian male, in no acute distress.  HEENT:  Head is atraumatic.  Eyes, pupils equal and reactive to light  and accommodation.  Extraocular movements are normal.  Ears: Tympanic  membranes are intact.  Nose:  There is no septal deviation.  Throat  without lesion.  NECK:  Supple without thyromegaly.  There is no supraclavicular or  axillary adenopathy.  CHEST:  Clear to  auscultation and percussion.  HEART:  Regular sinus rhythm.  No murmurs.  ABDOMEN:  Soft.  There is no hepatosplenomegaly.  EXTREMITIES:  Pulses 2+.  There is no clubbing or edema.  NEUROLOGIC:  He is oriented x3.  SKIN:  Without lesion.   IMPRESSION:  1. 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  2. Gastroesophageal reflux disease.  3. Esophageal stricture.  4. History of coronary artery disease.  5. Hypertension.  6. Dyslipidemia.   PLAN:  VATS, right middle lobectomy.      Ines Bloomer, M.D.  Electronically Signed     DPB/MEDQ  D:  03/01/2009  T:  03/02/2009  Job:  [id number]

## 2010-11-19 NOTE — Letter (Signed)
February 13, 2009   Lajuana Matte, MD  [phone] N. [address]  [place], [place] [address]   Re:  Mathew Hall, Mathew Hall               DOB:  [birth year]   Dear Arbutus Ped,   I saw the patient back today and we discussed his options.  I told him  that I was being aggressive to go with surgery after starting with three  lesions, but since there is only one, the residual one now is in the  right middle lobe, I will do a right middle lobectomy on him.  His  pulmonary function tests were more than adequate to tolerate that and  then after that I will also do a subcarinal and node dissection as well  as probably a mediastinal node dissection.  His blood pressure 100/62,  pulse 78, respirations 18, and saturations were 95%.  We will plan to do  this on March 05, 2009, at [facility].   Ines Bloomer, M.D.  Electronically Signed   DPB/MEDQ  D:  02/13/2009  T:  02/14/2009  Job:  [id number]

## 2010-11-19 NOTE — Op Note (Signed)
Mathew Hall, Mathew Hall NO.:  [id number]   MEDICAL RECORD NO.:  [id number]          PATIENT TYPE:  INP   LOCATION:  2310                         FACILITY:  [facility]   PHYSICIAN:  Ines Bloomer, M.D. DATE OF BIRTH:  Jun 15, 1941   DATE OF PROCEDURE:  DATE OF DISCHARGE:                               OPERATIVE REPORT   PREOPERATIVE DIAGNOSIS:  Right middle lobe mass status post  chemotherapy.   POSTOPERATIVE DIAGNOSIS:  Adenocarcinoma of the right middle lobe.   OPERATION:  Right video-assisted thoracoscopic surgery, right  thoracotomy, right middle lobectomy with node dissection.   SURGEON:  Ines Bloomer, MD   FIRST ASSISTANT:  Maxie Better, RNFA   ANESTHESIA:  General anesthesia.   DESCRIPTION OF PROCEDURE:  After percutaneous insertion of all  monitoring lines, the patient underwent general anesthesia was prepped  and draped in the usual sterile manner.  He was turned to the right  lateral thoracotomy position.  Two trocar sites were made in the  anterior and posterior axillary line at the seventh intercostal spaces.  A 0-degree scope was inserted.  The lesion was seen in the lateral  segment of the right middle lobe.  Because the patient had had a left  lower lobe lesion, as well as subcarinal adenopathy which had responded  to chemotherapy and completely resolved it was thought to do a complete  node dissection on the right side, as well as the right middle lobe.  A  posterolateral thoracotomy was made over the fifth intercostal space and  latissimus was partially divided, the serratus reflected anterior, the  fifth intercostal space was entered and a t-PA was placed in the  intercostal space.  The inferior pulmonary leg vein was taken down and  dissected out the 9R node then dissected superiorly, we dissected out 3  out of 7 nodes, one of which appeared to be possibly having cancer and  doing a complete dissection of the subcarinal space.  All  bleeding was  electrocoagulated, then we went superiorly and dissected out the 4R  nodes.  Attention was then turned to the right middle lobe and in the  fissure.  We dissected out both the 11R and 2R nodes, dividing the  inferior portion of the major fissure with a Covidien 45 degree 3.2  stapler.  This exposed the right middle lobe vein drainage which was  divided with the 3.2 stapler and then using also a 3.2 30-degree stapler  we divided the bronchus.  There was just one artery to the right middle  lobe and that was divided with Auto Suture 2.0 grade stapler.  Finally,  minor fissure was divided with three applications of the Covidien purple  stapler 60 mm in length.  The right middle lobe was removed.  Bronchial  margin was negative.  The cancer was a non-small cell lung cancer.  All  nodes were removed.  CoSeal was applied to the staple line.  Two chest  tubes were placed to the trocar sites and tied into place with 0 silk.  An intercostal nerve block was done in the usual  fashion and an On-Q catheter was inserted in the usual fashion.  Chest  was closed with four pericostal drilled through the sixth rib and passed  around the fifth rib, #1 Vicryl in the muscle layer, 2-0 Vicryl in the  subcutaneous tissue and Dermabond for the skin.  The patient was turned  to the recovery room in stable condition.      Ines Bloomer, M.D.  Electronically Signed     DPB/MEDQ  D:  03/05/2009  T:  03/06/2009  Job:  [id number]

## 2010-11-19 NOTE — Assessment & Plan Note (Signed)
OFFICE VISIT   ATHEN, Mathew Hall  DOB:  [birth year]                                        May 16, 2009  CHART #:  [id number]   The patient came today.  He is doing well.  He has some minimal post  thoracotomy pain.  His blood pressure is 142/84, pulse 70, respirations  16, sats were 97%.  Lungs clear to auscultation and percussion.  Incision is well healed.  We plan to see him back again in 3 months with  a chest x-ray.  He will get a CT scan from [facility] in about 2 months.   Ines Bloomer, M.D.  Electronically Signed   DPB/MEDQ  D:  05/16/2009  T:  05/17/2009  Job:  [id number]

## 2010-11-19 NOTE — Letter (Signed)
March 14, 2009   Lajuana Matte, MD  [phone] [address]  [place], [place] [address]   Re:  LEDFORD, GOODSON               DOB:  10-23-1940   Dear Arbutus Ped:   I saw the patient in the office today.  He has done well after his  surgery.  His incision is well healed.  We removed his chest tube  sutures.  His blood pressure was 105/68, pulse 82, respirations 18, and  sats were 95%.  I have told him I will see him back again in 3 weeks  with a chest x-ray.  I told him to gradually increase his   DICTATION ENDED AT THIS POINT   Ines Bloomer, M.D.  Electronically Signed   DPB/MEDQ  D:  03/14/2009  T:  03/14/2009  Job:  [id number]

## 2010-11-19 NOTE — Assessment & Plan Note (Signed)
OFFICE VISIT   Mathew Hall, Mathew Hall  DOB:  1940/09/30                                        April 04, 2009  CHART #:  [id number]   The patient returns a day.  His incisions are well healed.  His chest x-  ray looks good.  He saw Dr. Arbutus Ped today and put on a schedule.  His  blood pressure is 129/81, pulse 80, respirations 18, sats were 96%.  He  is doing well overall, and I will see me him again in 6 months with a  chest x-ray.   Ines Bloomer, M.D.  Electronically Signed   DPB/MEDQ  D:  04/04/2009  T:  04/05/2009  Job:  [id number]

## 2010-11-19 NOTE — Assessment & Plan Note (Signed)
OFFICE VISIT   Mathew Hall, Mathew Hall  DOB:  [birth year]                                        August 15, 2009  CHART #:  [id number]   The patient came today.  His chest x-ray is stable.  A CT scan done 2  months ago, which showed no evidence of recurrence of his cancer.  He  has another one scheduled in March.  His blood pressure was 147/84,  pulse 65, respirations 18, and sats were 95%.  I plan to see him back  again in 4 months after he gets his next 2 CT scans.   Ines Bloomer, M.D.  Electronically Signed   DPB/MEDQ  D:  08/15/2009  T:  08/16/2009  Job:  [id number]

## 2010-11-22 NOTE — Assessment & Plan Note (Signed)
[facility]                         GASTROENTEROLOGY OFFICE NOTE   NAME:Yo, NAVI ERBER                      MRN:          [id number]  DATE:07/27/2006                            DOB:          [birth year]    HISTORY:  Mr. Kimball presents today for followup.  He has a history of  coronary artery disease with prior myocardial infarction,  hyperlipidemia, reflux disease, and colon polyps.  He underwent  colonoscopy and upper endoscopy in October 2005.  He was last seen in  the office June 18, 2005; see that dictation for details.  Since  that time he has continued on Nexium 40 mg daily.  On medication, he has  no symptoms of heartburn, indigestion, regurgitation, or dysphagia.  No  other GI complaints.  He does inquire regarding possible cost-effective  alternatives to his Nexium, as he is anticipating moving to Medicare  upon turning 66 later this year.   CURRENT MEDICATIONS:  1. Aspirin 325 mg daily.  2. Welchol six tablets daily.  3. Zocor 80 mg daily.  4. Nexium 40 mg daily.  5. Coreg 6.25 mg b.i.d.  6. Maxzide 37.5/25 mg daily.  7. Norvasc 5 mg daily.  8. Sublingual nitroglycerin p.r.n.   PHYSICAL EXAMINATION:  GENERAL:  Well-appearing male in no acute  distress.  VITAL SIGNS:  Blood pressure is 138/80, heart rate 74 and regular,  weight is 193.6 pounds.  HEENT:  Sclerae are anicteric.  LUNGS:  Clear.  HEART:  Regular.  ABDOMEN:  Soft without tenderness, mass or hernia.  Good bowel sounds  heard.   IMPRESSION:  1. Gastroesophageal reflux disease.  The patient is asymptomatic on      Nexium 40 mg daily.  2. History of adenomatous colon polyps.  Due for followup in October      2010.  3. Multiple general medical problems under the care of Dr. Jarome Matin.   RECOMMENDATIONS:  1. Continue Nexium 40 mg daily.  A prescription with multiple refills      has been provided.  2. The patient could consider Prilosec OTC 20 mg or 40  mg daily      (lowest dose to control symptoms) if he finds this more cost      effective.  3. Surveillance colonoscopy October 2010.  4. Ongoing general medical care with Dr. Eloise Harman.     Wilhemina Bonito. Marina Goodell, MD  Electronically Signed    JNP/MedQ  DD: 07/27/2006  DT: 07/27/2006  Job #: [id number]   cc:   Barry Dienes. Eloise Harman, M.D.

## 2010-12-30 ENCOUNTER — Encounter (HOSPITAL_COMMUNITY): Payer: Self-pay

## 2010-12-30 ENCOUNTER — Other Ambulatory Visit: Payer: Self-pay | Admitting: Internal Medicine

## 2010-12-30 ENCOUNTER — Ambulatory Visit (HOSPITAL_COMMUNITY)
Admission: RE | Admit: 2010-12-30 | Discharge: 2010-12-30 | Disposition: A | Discharge status: Home / Self Care | Payer: Medicare Other | Source: Ambulatory Visit | Attending: Internal Medicine | Admitting: Internal Medicine

## 2010-12-30 ENCOUNTER — Encounter (HOSPITAL_BASED_OUTPATIENT_CLINIC_OR_DEPARTMENT_OTHER): Payer: Medicare Other | Admitting: Internal Medicine

## 2010-12-30 DIAGNOSIS — C342 Malignant neoplasm of middle lobe, bronchus or lung: Secondary | ICD-10-CM

## 2010-12-30 DIAGNOSIS — C3490 Malignant neoplasm of unspecified part of unspecified bronchus or lung: Secondary | ICD-10-CM

## 2010-12-30 DIAGNOSIS — M4000 Postural kyphosis, site unspecified: Secondary | ICD-10-CM | POA: Insufficient documentation

## 2010-12-30 DIAGNOSIS — J438 Other emphysema: Secondary | ICD-10-CM | POA: Insufficient documentation

## 2010-12-30 DIAGNOSIS — E041 Nontoxic single thyroid nodule: Secondary | ICD-10-CM | POA: Insufficient documentation

## 2010-12-30 DIAGNOSIS — M47814 Spondylosis without myelopathy or radiculopathy, thoracic region: Secondary | ICD-10-CM | POA: Insufficient documentation

## 2010-12-30 HISTORY — DX: Essential (primary) hypertension: I10

## 2010-12-30 HISTORY — DX: Malignant (primary) neoplasm, unspecified: C80.1

## 2010-12-30 LAB — CBC WITH DIFFERENTIAL/PLATELET
BASO%: 0.5 % (ref 0.0–2.0)
Basophils Absolute: 0 10*3/uL (ref 0.0–0.1)
EOS%: 2.3 % (ref 0.0–7.0)
HCT: 38.7 % (ref 38.4–49.9)
HGB: 13.3 g/dL (ref 13.0–17.1)
LYMPH%: 33 % (ref 14.0–49.0)
MCH: 30 pg (ref 27.2–33.4)
MCHC: 34.4 g/dL (ref 32.0–36.0)
MCV: 87.1 fL (ref 79.3–98.0)
MONO%: 11.7 % (ref 0.0–14.0)
NEUT%: 52.5 % (ref 39.0–75.0)
Platelets: 169 10*3/uL (ref 140–400)
lymph#: 1.5 10*3/uL (ref 0.9–3.3)

## 2010-12-30 LAB — CMP (CANCER CENTER ONLY)
ALT(SGPT): 25 U/L (ref 10–47)
AST: 24 U/L (ref 11–38)
BUN, Bld: 15 mg/dL (ref 7–22)
Calcium: 9.1 mg/dL (ref 8.0–10.3)
Chloride: 101 mEq/L (ref 98–108)
Creat: 1 mg/dl (ref 0.6–1.2)
Total Bilirubin: 0.7 mg/dl (ref 0.20–1.60)

## 2010-12-30 MED ORDER — IOHEXOL 300 MG/ML  SOLN
80.0000 mL | Freq: Once | INTRAMUSCULAR | Status: AC | PRN
  Administered 2010-12-30: 80 mL via INTRAVENOUS

## 2011-01-01 ENCOUNTER — Other Ambulatory Visit: Payer: Self-pay | Admitting: Internal Medicine

## 2011-01-01 ENCOUNTER — Encounter (HOSPITAL_BASED_OUTPATIENT_CLINIC_OR_DEPARTMENT_OTHER): Payer: Medicare Other | Admitting: Internal Medicine

## 2011-01-01 DIAGNOSIS — C3490 Malignant neoplasm of unspecified part of unspecified bronchus or lung: Secondary | ICD-10-CM

## 2011-01-01 DIAGNOSIS — C342 Malignant neoplasm of middle lobe, bronchus or lung: Secondary | ICD-10-CM

## 2011-04-18 LAB — POCT CARDIAC MARKERS
CKMB, poc: 11.1
CKMB, poc: 13.4
CKMB, poc: 13.6
Myoglobin, poc: 236
Myoglobin, poc: 239
Myoglobin, poc: 268
Operator id: 272551
Troponin i, poc: <0.05
Troponin i, poc: <0.05

## 2011-04-18 LAB — I-STAT 8, (EC8 V) (CONVERTED LAB)
Acid-base deficit: 3 — ABNORMAL HIGH
BUN: 19
Bicarbonate: 23.8
HCT: 41
Operator id: 272551
pCO2, Ven: 46.4

## 2011-04-18 LAB — POCT I-STAT CREATININE: Creatinine, Ser: 1.6 — ABNORMAL HIGH

## 2011-06-26 ENCOUNTER — Other Ambulatory Visit: Payer: Medicare Other | Admitting: Lab

## 2011-06-26 ENCOUNTER — Ambulatory Visit (HOSPITAL_COMMUNITY)
Admission: RE | Admit: 2011-06-26 | Discharge: 2011-06-26 | Disposition: A | Discharge status: Home / Self Care | Payer: Medicare Other | Source: Ambulatory Visit | Attending: Internal Medicine | Admitting: Internal Medicine

## 2011-06-26 ENCOUNTER — Other Ambulatory Visit: Payer: Self-pay | Admitting: Internal Medicine

## 2011-06-26 DIAGNOSIS — K449 Diaphragmatic hernia without obstruction or gangrene: Secondary | ICD-10-CM | POA: Insufficient documentation

## 2011-06-26 DIAGNOSIS — C3490 Malignant neoplasm of unspecified part of unspecified bronchus or lung: Secondary | ICD-10-CM | POA: Insufficient documentation

## 2011-06-26 DIAGNOSIS — K7689 Other specified diseases of liver: Secondary | ICD-10-CM | POA: Insufficient documentation

## 2011-06-26 LAB — CBC WITH DIFFERENTIAL/PLATELET
BASO%: 0.6 % (ref 0.0–2.0)
EOS%: 2.7 % (ref 0.0–7.0)
MCH: 29.6 pg (ref 27.2–33.4)
MCV: 85.9 fL (ref 79.3–98.0)
MONO%: 13.6 % (ref 0.0–14.0)
NEUT#: 1.3 10*3/uL — ABNORMAL LOW (ref 1.5–6.5)
RBC: 4.84 10*6/uL (ref 4.20–5.82)
RDW: 13.9 % (ref 11.0–14.6)

## 2011-06-26 LAB — CMP (CANCER CENTER ONLY)
ALT(SGPT): 19 U/L (ref 10–47)
AST: 30 U/L (ref 11–38)
Albumin: 3.7 g/dL (ref 3.3–5.5)
Alkaline Phosphatase: 98 U/L — ABNORMAL HIGH (ref 26–84)
Potassium: 3.9 mEq/L (ref 3.3–4.7)
Sodium: 142 mEq/L (ref 128–145)
Total Protein: 8.5 g/dL — ABNORMAL HIGH (ref 6.4–8.1)

## 2011-06-26 MED ORDER — IOHEXOL 300 MG/ML  SOLN
100.0000 mL | Freq: Once | INTRAMUSCULAR | Status: AC | PRN
  Administered 2011-06-26: 100 mL via INTRAVENOUS

## 2011-07-03 ENCOUNTER — Telehealth: Payer: Self-pay | Admitting: Internal Medicine

## 2011-07-03 ENCOUNTER — Ambulatory Visit (HOSPITAL_BASED_OUTPATIENT_CLINIC_OR_DEPARTMENT_OTHER): Payer: Medicare Other | Admitting: Internal Medicine

## 2011-07-03 VITALS — BP 117/66 | HR 59 | Temp 97.0°F | Ht 70.0 in | Wt 188.5 lb

## 2011-07-03 DIAGNOSIS — C3490 Malignant neoplasm of unspecified part of unspecified bronchus or lung: Secondary | ICD-10-CM

## 2011-07-03 NOTE — Telephone Encounter (Signed)
appts made and printed for lab ct and md for 12/2011    aom

## 2011-07-03 NOTE — Progress Notes (Signed)
[facility] OFFICE PROGRESS NOTE   DIAGNOSIS: Metastatic nonsmall cell lung cancer, adenocarcinoma, diagnosed in May 2010.  PRIOR THERAPY: :   1. Status post 3 cycles of neoadjuvant chemotherapy with carboplatin and Alimta; last dose was given January 10, 2009, with complete resolution of the left upper lobe mass as well as the subcarinal lymphadenopathy and partial response in the right middle lobe lung lesion. 2. Status post right middle lobectomy with lymph node dissection under the care of Dr. 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  CURRENT THERAPY: Observation.  INTERVAL HISTORY: Mathew Hall 70 y.o. male returns to the clinic today for six-month followup visit. The patient is feeling fine today and he denied having any significant complaints. He has no weight loss or night sweats, no chest pain or shortness of breath, no cough or hemoptysis. He has repeat CT scan of the chest performed recently and he is here today for evaluation and discussion of his scan results.  MEDICAL HISTORY: Past Medical History  Diagnosis Date  . lung ca dx'd 10/2008    chemo comp 01/2009  . Hypertension     ALLERGIES:  is allergic to atorvastatin and lipitor.  MEDICATIONS:  Current Outpatient Prescriptions  Medication Sig Dispense Refill  . aspirin 325 MG EC tablet Take 325 mg by mouth daily.        . colesevelam (WELCHOL) 625 MG tablet Take 625 mg by mouth 2 (two) times daily with a meal.        . metoprolol succinate (TOPROL-XL) 25 MG 24 hr tablet Take 25 mg by mouth daily.        . simvastatin (ZOCOR) 80 MG tablet Take 80 mg by mouth at bedtime.        Marland Kitchen NEXIUM 40 MG capsule TAKE 1 CAPSULE EVERY DAY  30 capsule  11  . nitroGLYCERIN (NITROSTAT) 0.4 MG SL tablet Place 0.4 mg under the tongue every 5 (five) minutes as needed.          REVIEW OF  SYSTEMS:  A comprehensive review of systems was negative.   PHYSICAL EXAMINATION: General appearance: alert, cooperative and no distress Head: Normocephalic, without obvious abnormality, atraumatic Neck: no adenopathy Lymph nodes: Cervical, supraclavicular, and axillary nodes normal. Resp: clear to auscultation bilaterally Cardio: regular rate and rhythm, S1, S2 normal, no murmur, click, rub or gallop GI: soft, non-tender; bowel sounds normal; no masses,  no organomegaly Extremities: extremities normal, atraumatic, no cyanosis or edema Neurologic: Alert and oriented X 3, normal strength and tone. Normal symmetric reflexes. Normal coordination and gait  ECOG PERFORMANCE STATUS: 0 - Asymptomatic  Blood pressure 117/66, pulse 59, temperature 97 F (36.1 C), temperature source Oral, height 5\' 10"  (1.778 m), weight 188 lb 8 oz (85.503 kg).  LABORATORY DATA: Lab Results  Component Value Date   WBC 2.9* 06/26/2011   HGB 14.3 06/26/2011   HCT 41.6 06/26/2011   MCV 85.9 06/26/2011   PLT 154 06/26/2011      Chemistry      Component Value Date/Time   NA 142 06/26/2011 0817   NA 138 04/30/2010 1122   K 3.9 06/26/2011 0817   K 4.5 04/30/2010 1122   CL 99 06/26/2011 0817   CL 104 04/30/2010 1122   CO2 30 06/26/2011 0817   CO2 27 04/30/2010 1122   BUN 16 06/26/2011 0817   BUN 17 04/30/2010 1122  CREATININE 1.2 06/26/2011 0817   CREATININE 1.16 04/30/2010 1122      Component Value Date/Time   CALCIUM 8.5 06/26/2011 0817   CALCIUM 9.5 04/30/2010 1122   ALKPHOS 98* 06/26/2011 0817   ALKPHOS 90 04/30/2010 1122   AST 30 06/26/2011 0817   AST 25 04/30/2010 1122   ALT 23 04/30/2010 1122   BILITOT 0.70 06/26/2011 0817   BILITOT 0.8 04/30/2010 1122       RADIOGRAPHIC STUDIES: Ct Chest W Contrast  06/26/2011  *RADIOLOGY REPORT*  Clinical Data: Follow-up lung carcinoma.  Recently complete chemotherapy.  CT CHEST WITH CONTRAST  Technique:  Multidetector CT imaging of the chest was  performed following the standard protocol during bolus administration of intravenous contrast.  Contrast: OMNIPAQUE IOHEXOL 300 MG/ML IV SOLN  Comparison: 12/30/2010  Findings: Post-treatment changes in the right lung are stable.  A tiny calcified granuloma in the peripheral right upper lobe is also unchanged.  No suspicious pulmonary nodules or masses are identified.  There is no evidence of pulmonary infiltrate.  No central endobronchial lesion identified.  There is no evidence of pleural or pericardial effusion.  No evidence of hilar or mediastinal masses.  No adenopathy seen elsewhere within the thorax.  A tiny hiatal hernia is again noted. Both adrenal glands remain normal in appearance.  Mild hepatic steatosis again noted.  No suspicious bone lesions are identified.  IMPRESSION:  1.  No evidence of recurrent or metastatic disease within the thorax. 2.  Stable tiny hiatal hernia and hepatic steatosis.  Original Report Authenticated By: Danae Orleans, M.D.    ASSESSMENT: This is a very pleasant 70 years old white male with metastatic non-small cell lung cancer diagnosed in May of 2010, status post neoadjuvant chemotherapy followed by a right middle lobectomy with lymph node dissection. The patient is doing fine today and he has no evidence for disease progression on his recent scan. I discussed the scan results with the patient.  PLAN: I recommend for him continuous observation for now. I would see him back for followup visit in 6 months with repeat CT scan of the chest.   All questions were answered. The patient knows to call the clinic with any problems, questions or concerns. We can certainly see the patient much sooner if necessary.

## 2011-12-24 ENCOUNTER — Telehealth: Payer: Self-pay | Admitting: Internal Medicine

## 2011-12-24 NOTE — Telephone Encounter (Signed)
l/m that appt was changed from 6/27 to 7/9   aom

## 2011-12-29 ENCOUNTER — Other Ambulatory Visit (HOSPITAL_BASED_OUTPATIENT_CLINIC_OR_DEPARTMENT_OTHER): Payer: Medicare Other | Admitting: Lab

## 2011-12-29 ENCOUNTER — Ambulatory Visit (HOSPITAL_COMMUNITY)
Admission: RE | Admit: 2011-12-29 | Discharge: 2011-12-29 | Disposition: A | Discharge status: Home / Self Care | Payer: Medicare Other | Source: Ambulatory Visit | Attending: Internal Medicine | Admitting: Internal Medicine

## 2011-12-29 DIAGNOSIS — N289 Disorder of kidney and ureter, unspecified: Secondary | ICD-10-CM

## 2011-12-29 DIAGNOSIS — C3490 Malignant neoplasm of unspecified part of unspecified bronchus or lung: Secondary | ICD-10-CM | POA: Insufficient documentation

## 2011-12-29 DIAGNOSIS — J438 Other emphysema: Secondary | ICD-10-CM | POA: Insufficient documentation

## 2011-12-29 LAB — CMP (CANCER CENTER ONLY)
AST: 25 U/L (ref 11–38)
Albumin: 3.7 g/dL (ref 3.3–5.5)
Alkaline Phosphatase: 87 U/L — ABNORMAL HIGH (ref 26–84)
BUN, Bld: 14 mg/dL (ref 7–22)
Potassium: 3.9 mEq/L (ref 3.3–4.7)

## 2011-12-29 LAB — CBC WITH DIFFERENTIAL/PLATELET
Basophils Absolute: 0.1 10*3/uL (ref 0.0–0.1)
EOS%: 2.7 % (ref 0.0–7.0)
HGB: 13.1 g/dL (ref 13.0–17.1)
MCH: 29 pg (ref 27.2–33.4)
MCV: 86.1 fL (ref 79.3–98.0)
MONO%: 12.7 % (ref 0.0–14.0)
RBC: 4.5 10*6/uL (ref 4.20–5.82)
RDW: 13.8 % (ref 11.0–14.6)

## 2011-12-29 MED ORDER — IOHEXOL 300 MG/ML  SOLN
80.0000 mL | Freq: Once | INTRAMUSCULAR | Status: AC | PRN
  Administered 2011-12-29: 80 mL via INTRAVENOUS

## 2011-12-30 ENCOUNTER — Other Ambulatory Visit: Payer: Medicare Other | Admitting: Lab

## 2012-01-01 ENCOUNTER — Ambulatory Visit: Payer: Medicare Other | Admitting: Internal Medicine

## 2012-01-13 ENCOUNTER — Ambulatory Visit (HOSPITAL_BASED_OUTPATIENT_CLINIC_OR_DEPARTMENT_OTHER): Payer: Medicare Other | Admitting: Internal Medicine

## 2012-01-13 ENCOUNTER — Telehealth: Payer: Self-pay | Admitting: Internal Medicine

## 2012-01-13 VITALS — BP 139/76 | HR 56 | Temp 97.4°F | Ht 70.0 in | Wt 190.8 lb

## 2012-01-13 DIAGNOSIS — C342 Malignant neoplasm of middle lobe, bronchus or lung: Secondary | ICD-10-CM

## 2012-01-13 DIAGNOSIS — C3490 Malignant neoplasm of unspecified part of unspecified bronchus or lung: Secondary | ICD-10-CM

## 2012-01-13 NOTE — Telephone Encounter (Signed)
appts made and printed for pt aom °

## 2012-01-13 NOTE — Progress Notes (Signed)
[facility] [phone]   Fax:(336) [phone]  OFFICE PROGRESS NOTE  DIAGNOSIS: Metastatic nonsmall cell lung cancer, adenocarcinoma, diagnosed in May 2010.   PRIOR THERAPY:  1. Status post 3 cycles of neoadjuvant chemotherapy with carboplatin and Alimta; last dose was given January 10, 2009, with complete resolution of the left upper lobe mass as well as the subcarinal lymphadenopathy and partial response in the right middle lobe lung lesion. 2. Status post right middle lobectomy with lymph node dissection under the care of Dr. 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  CURRENT THERAPY: Observation.   INTERVAL HISTORY: Mathew Hall 71 y.o. male returns to the clinic today for routine six-month followup visit. The patient has no complaints today. He denied having any significant chest pain or shortness breath, no cough or hemoptysis. He has no weight loss or night sweats. He has repeat CT scan of the chest with contrast performed recently and he is here today for evaluation and discussion of his scan results.  MEDICAL HISTORY: Past Medical History  Diagnosis Date  . lung ca dx'd 10/2008    chemo comp 01/2009  . Hypertension     ALLERGIES:  is allergic to atorvastatin and lipitor.  MEDICATIONS:  Current Outpatient Prescriptions  Medication Sig Dispense Refill  . aspirin 325 MG EC tablet Take 325 mg by mouth daily.        . colesevelam (WELCHOL) 625 MG tablet Take 625 mg by mouth 2 (two) times daily with a meal.        . metoprolol succinate (TOPROL-XL) 25 MG 24 hr tablet Take 25 mg by mouth daily.        Marland Kitchen NEXIUM 40 MG capsule TAKE 1 CAPSULE EVERY DAY  30 capsule  11  . simvastatin (ZOCOR) 80 MG tablet Take 80 mg by mouth at bedtime.        . nitroGLYCERIN (NITROSTAT) 0.4 MG SL tablet Place 0.4 mg under the tongue  every 5 (five) minutes as needed.          REVIEW OF SYSTEMS:  A comprehensive review of systems was negative.   PHYSICAL EXAMINATION: General appearance: alert, cooperative and no distress Neck: no adenopathy Lymph nodes: Cervical, supraclavicular, and axillary nodes normal. Resp: clear to auscultation bilaterally Cardio: regular rate and rhythm, S1, S2 normal, no murmur, click, rub or gallop GI: soft, non-tender; bowel sounds normal; no masses,  no organomegaly Extremities: extremities normal, atraumatic, no cyanosis or edema Neurologic: Alert and oriented X 3, normal strength and tone. Normal symmetric reflexes. Normal coordination and gait  ECOG PERFORMANCE STATUS: 0 - Asymptomatic  Blood pressure 139/76, pulse 56, temperature 97.4 F (36.3 C), temperature source Oral, height 5\' 10"  (1.778 m), weight 190 lb 12.8 oz (86.546 kg).  LABORATORY DATA: Lab Results  Component Value Date   WBC 4.6 12/29/2011   HGB 13.1 12/29/2011   HCT 38.7 12/29/2011   MCV 86.1 12/29/2011   PLT 180 12/29/2011      Chemistry      Component Value Date/Time   NA 138 12/29/2011 0802   NA 138 04/30/2010 1122   K 3.9 12/29/2011 0802   K 4.5 04/30/2010 1122   CL 99 12/29/2011 0802   CL 104 04/30/2010 1122   CO2 28 12/29/2011 0802   CO2 27 04/30/2010 1122   BUN 14 12/29/2011 0802   BUN  17 04/30/2010 1122   CREATININE 1.1 12/29/2011 0802   CREATININE 1.16 04/30/2010 1122      Component Value Date/Time   CALCIUM 8.8 12/29/2011 0802   CALCIUM 9.5 04/30/2010 1122   ALKPHOS 87* 12/29/2011 0802   ALKPHOS 90 04/30/2010 1122   AST 25 12/29/2011 0802   AST 25 04/30/2010 1122   ALT 23 04/30/2010 1122   BILITOT 1.00 12/29/2011 0802   BILITOT 0.8 04/30/2010 1122       RADIOGRAPHIC STUDIES: Ct Chest W Contrast  12/29/2011  *RADIOLOGY REPORT*  Clinical Data: Restaging lung cancer  CT CHEST WITH CONTRAST  Technique:  Multidetector CT imaging of the chest was performed following the standard protocol during bolus  administration of intravenous contrast.  Contrast: 80mL OMNIPAQUE IOHEXOL 300 MG/ML  SOLN  Comparison: 06/26/2011  Findings:  No axillary or supraclavicular adenopathy.  Thyroid gland appears normal.  No mediastinal or hilar adenopathy.  No pericardial or pleural effusion.  Mild emphysema.  Postoperative change in volume loss within the right midlung is again identified.  Calcified granuloma within the right upper lobe is stable from previous exam.  There is no suspicious pulmonary parenchymal nodule or mass identified.  Review of the visualized osseous structures is significant for mild multilevel degenerative disc disease.  No aggressive lytic or sclerotic bone lesions identified.  Limited imaging through the upper abdomen shows normal appearing adrenal glands.  IMPRESSION:  1.  No acute findings and no evidence for recurrent disease within the chest.  Original Report Authenticated By: Rosealee Albee, M.D.    ASSESSMENT: This is a very pleasant 71 years old white male with metastatic non-small cell lung cancer diagnosed in May of 2000 and status post neoadjuvant chemotherapy followed by right middle lobectomy with lymph node dissection and he has been observation since August of 2010 with no evidence for disease recurrence.  PLAN: I discussed the scan results with the patient and recommended for him continuous observation for now with repeat CT scan of the chest in 6 months. He was advised to call me immediately if he has any concerning symptoms in the interval.  All questions were answered. The patient knows to call the clinic with any problems, questions or concerns. We can certainly see the patient much sooner if necessary.

## 2012-03-17 ENCOUNTER — Encounter: Payer: Self-pay | Admitting: Internal Medicine

## 2012-04-01 DIAGNOSIS — R0989 Other specified symptoms and signs involving the circulatory and respiratory systems: Secondary | ICD-10-CM

## 2012-04-01 HISTORY — DX: Other specified symptoms and signs involving the circulatory and respiratory systems: R09.89

## 2012-04-16 ENCOUNTER — Ambulatory Visit (INDEPENDENT_AMBULATORY_CARE_PROVIDER_SITE_OTHER): Payer: Medicare Other | Admitting: Internal Medicine

## 2012-04-16 ENCOUNTER — Encounter: Payer: Self-pay | Admitting: Internal Medicine

## 2012-04-16 ENCOUNTER — Other Ambulatory Visit: Payer: Self-pay | Admitting: *Deleted

## 2012-04-16 VITALS — BP 134/72 | HR 64 | Ht 68.25 in | Wt 193.1 lb

## 2012-04-16 DIAGNOSIS — Z860100 Personal history of colon polyps, unspecified: Secondary | ICD-10-CM

## 2012-04-16 DIAGNOSIS — K222 Esophageal obstruction: Secondary | ICD-10-CM

## 2012-04-16 DIAGNOSIS — K219 Gastro-esophageal reflux disease without esophagitis: Secondary | ICD-10-CM

## 2012-04-16 DIAGNOSIS — C3490 Malignant neoplasm of unspecified part of unspecified bronchus or lung: Secondary | ICD-10-CM

## 2012-04-16 MED ORDER — ESOMEPRAZOLE MAGNESIUM 40 MG PO CPDR: 40.0000 mg | DELAYED_RELEASE_CAPSULE | Freq: Every day | ORAL | Status: DC

## 2012-04-16 NOTE — Progress Notes (Signed)
HISTORY OF PRESENT ILLNESS:  Mathew Hall is a 71 y.o. male with the below listed medical history who is followed in this office for GERD and adenomatous colon polyps. The patient has not been seen since March of 2011. He presents today for routine followup. He is overdue. For his GERD he takes Nexium 40 mg daily. On medication, no reflux symptoms unless he eats peanut butter prior to going to bed. He denies dysphagia. His last upper endoscopy in October 2005 revealed esophagitis and distal esophageal stricture. His GI review of systems is negative. His last colonoscopy was performed March of 2010. Diminutive adenoma removed. Followup in 5 years recommended. He tells me that his chronic medical problems are stable. Review of outside laboratories from June 2013 reveal normal comprehensive metabolic panel and CBC with differential. He is accompanied by his wife  REVIEW OF SYSTEMS:  All non-GI ROS negative   Past Medical History  Diagnosis Date  . lung ca dx'd 10/2008    chemo comp 01/2009  . Hypertension   . GERD (gastroesophageal reflux disease)   . Hyperlipemia   . Heart attack   . Peptic stricture of esophagus   . Colon polyps   . Hemorrhoids   . Asthma     Past Surgical History  Procedure Date  . Angioplasty     with 2 stent placement  . Lung surgery     right    Social History Mathew Hall  reports that he quit smoking about 14 years ago. He has never used smokeless tobacco. He reports that he drinks alcohol. He reports that he does not use illicit drugs.  family history is not on file.  Allergies  Allergen Reactions  . Atorvastatin   . Lipitor (Atorvastatin Calcium)        PHYSICAL EXAMINATION: Vital signs: BP 134/72  Pulse 64  Ht 5' 8.25" (1.734 m)  Wt 193 lb 2 oz (87.601 kg)  BMI 29.15 kg/m2 General: Well-developed, well-nourished, no acute distress HEENT: Sclerae are anicteric, conjunctiva pink. Oral mucosa intact Lungs: Clear Heart: Regular Abdomen:  soft, nontender, nondistended, no obvious ascites, no peritoneal signs, normal bowel sounds. No organomegaly. Extremities: No edema Psychiatric: alert and oriented x3. Cooperative    ASSESSMENT:  #1. GERD. Prior endoscopy revealing esophagitis and stricture. Currently asymptomatic on PPI #2. History of adenomatous colon polyps. Last colonoscopy March 2010   PLAN:  #1. Reflux precautions #2. Refill Nexium prescription 40 mg daily #3. Surveillance colonoscopy around March 2015. Interval GI followup as needed

## 2012-04-16 NOTE — Patient Instructions (Addendum)
We have sent the following medications to your pharmacy for you to pick up at your convenience:  Nexium  

## 2012-07-13 ENCOUNTER — Ambulatory Visit (HOSPITAL_COMMUNITY): Payer: Medicare Other

## 2012-07-13 ENCOUNTER — Ambulatory Visit (HOSPITAL_COMMUNITY)
Admission: RE | Admit: 2012-07-13 | Discharge: 2012-07-13 | Disposition: A | Discharge status: Home / Self Care | Payer: Medicare Other | Source: Ambulatory Visit | Attending: Internal Medicine | Admitting: Internal Medicine

## 2012-07-13 ENCOUNTER — Other Ambulatory Visit (HOSPITAL_BASED_OUTPATIENT_CLINIC_OR_DEPARTMENT_OTHER): Payer: Medicare Other | Admitting: Lab

## 2012-07-13 DIAGNOSIS — C342 Malignant neoplasm of middle lobe, bronchus or lung: Secondary | ICD-10-CM

## 2012-07-13 DIAGNOSIS — C3490 Malignant neoplasm of unspecified part of unspecified bronchus or lung: Secondary | ICD-10-CM

## 2012-07-13 DIAGNOSIS — Z9221 Personal history of antineoplastic chemotherapy: Secondary | ICD-10-CM | POA: Insufficient documentation

## 2012-07-13 DIAGNOSIS — E041 Nontoxic single thyroid nodule: Secondary | ICD-10-CM | POA: Insufficient documentation

## 2012-07-13 DIAGNOSIS — J984 Other disorders of lung: Secondary | ICD-10-CM | POA: Insufficient documentation

## 2012-07-13 LAB — CBC WITH DIFFERENTIAL/PLATELET
Basophils Absolute: 0.1 10*3/uL (ref 0.0–0.1)
HCT: 42.6 % (ref 38.4–49.9)
HGB: 14.3 g/dL (ref 13.0–17.1)
MONO#: 0.4 10*3/uL (ref 0.1–0.9)
NEUT#: 2.8 10*3/uL (ref 1.5–6.5)
NEUT%: 59.5 % (ref 39.0–75.0)
WBC: 4.7 10*3/uL (ref 4.0–10.3)
lymph#: 1.4 10*3/uL (ref 0.9–3.3)

## 2012-07-13 LAB — COMPREHENSIVE METABOLIC PANEL (CC13)
AST: 20 U/L (ref 5–34)
BUN: 13 mg/dL (ref 7.0–26.0)
CO2: 26 mEq/L (ref 22–29)
Calcium: 9.8 mg/dL (ref 8.4–10.4)
Chloride: 105 mEq/L (ref 98–107)
Creatinine: 1.2 mg/dL (ref 0.7–1.3)
Glucose: 133 mg/dl — ABNORMAL HIGH (ref 70–99)

## 2012-07-13 MED ORDER — IOHEXOL 300 MG/ML  SOLN
80.0000 mL | Freq: Once | INTRAMUSCULAR | Status: AC | PRN
  Administered 2012-07-13: 80 mL via INTRAVENOUS

## 2012-07-15 ENCOUNTER — Encounter: Payer: Self-pay | Admitting: Internal Medicine

## 2012-07-15 ENCOUNTER — Telehealth: Payer: Self-pay | Admitting: Internal Medicine

## 2012-07-15 ENCOUNTER — Ambulatory Visit (HOSPITAL_BASED_OUTPATIENT_CLINIC_OR_DEPARTMENT_OTHER): Payer: Medicare Other | Admitting: Internal Medicine

## 2012-07-15 VITALS — BP 158/86 | HR 58 | Temp 98.0°F | Resp 22 | Ht 68.25 in | Wt 192.1 lb

## 2012-07-15 DIAGNOSIS — C342 Malignant neoplasm of middle lobe, bronchus or lung: Secondary | ICD-10-CM

## 2012-07-15 DIAGNOSIS — C3490 Malignant neoplasm of unspecified part of unspecified bronchus or lung: Secondary | ICD-10-CM

## 2012-07-15 NOTE — Progress Notes (Signed)
[facility] [phone]   Fax:(336) [phone]  OFFICE PROGRESS NOTE  Garlan Fillers, MD [address] [facility], [place]. [place] [address]  DIAGNOSIS: Metastatic nonsmall cell lung cancer, adenocarcinoma, diagnosed in May 2010.   PRIOR THERAPY:  1. Status post 3 cycles of neoadjuvant chemotherapy with carboplatin and Alimta; last dose was given January 10, 2009, with complete resolution of the left upper lobe mass as well as the subcarinal lymphadenopathy and partial response in the right middle lobe lung lesion. 2. Status post right middle lobectomy with lymph node dissection under the care of Dr. 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  CURRENT THERAPY: Observation.  INTERVAL HISTORY: Mathew Hall 72 y.o. male returns to the clinic today for routine six-month followup visit. The patient is feeling fine today with no specific complaints. He denied having any significant weight loss or night sweats. He denied having any chest pain, shortness breath, cough or hemoptysis. The patient has no nausea or vomiting, no fatigue or weakness. He had repeat CT scan of the chest performed recently and he is here today for evaluation and discussion of his scan results.  MEDICAL HISTORY: Past Medical History  Diagnosis Date  . lung ca dx'd 10/2008    chemo comp 01/2009  . Hypertension   . GERD (gastroesophageal reflux disease)   . Hyperlipemia   . Heart attack   . Peptic stricture of esophagus   . Colon polyps   . Hemorrhoids   . Asthma     ALLERGIES:  is allergic to atorvastatin and lipitor.  MEDICATIONS:  Current Outpatient Prescriptions  Medication Sig Dispense Refill  . aspirin 325 MG EC tablet Take 325 mg by mouth daily.        . colesevelam (WELCHOL) 625 MG tablet Take 625 mg by mouth 2 (two)  times daily with a meal.        . esomeprazole (NEXIUM) 40 MG capsule Take 1 capsule (40 mg total) by mouth daily before breakfast.  30 capsule  11  . NEXIUM 40 MG capsule TAKE 1 CAPSULE EVERY DAY  30 capsule  11  . simvastatin (ZOCOR) 40 MG tablet Take 40 mg by mouth every evening.      . nitroGLYCERIN (NITROSTAT) 0.4 MG SL tablet Place 0.4 mg under the tongue every 5 (five) minutes as needed.          SURGICAL HISTORY:  Past Surgical History  Procedure Date  . Angioplasty     with 2 stent placement  . Lung surgery     right    REVIEW OF SYSTEMS:  A comprehensive review of systems was negative.   PHYSICAL EXAMINATION: General appearance: alert, cooperative and no distress Head: Normocephalic, without obvious abnormality, atraumatic Neck: no adenopathy Lymph nodes: Cervical, supraclavicular, and axillary nodes normal. Resp: clear to auscultation bilaterally Cardio: regular rate and rhythm, S1, S2 normal, no murmur, click, rub or gallop GI: soft, non-tender; bowel sounds normal; no masses,  no organomegaly Extremities: extremities normal, atraumatic, no cyanosis or edema  ECOG PERFORMANCE STATUS: 0 - Asymptomatic  Blood pressure 158/86, pulse 58, temperature 98 F (36.7 C), temperature source Oral, resp. rate 22, height 5' 8.25" (1.734 m), weight 192 lb 1.6 oz (87.136 kg).  LABORATORY DATA: Lab Results  Component Value Date   WBC 4.7 07/13/2012   HGB 14.3 07/13/2012  HCT 42.6 07/13/2012   MCV 86.4 07/13/2012   PLT 185 07/13/2012      Chemistry      Component Value Date/Time   NA 139 07/13/2012 0848   NA 138 12/29/2011 0802   NA 138 04/30/2010 1122   K 3.6 07/13/2012 0848   K 3.9 12/29/2011 0802   K 4.5 04/30/2010 1122   CL 105 07/13/2012 0848   CL 99 12/29/2011 0802   CL 104 04/30/2010 1122   CO2 26 07/13/2012 0848   CO2 28 12/29/2011 0802   CO2 27 04/30/2010 1122   BUN 13.0 07/13/2012 0848   BUN 14 12/29/2011 0802   BUN 17 04/30/2010 1122   CREATININE 1.2 07/13/2012 0848    CREATININE 1.1 12/29/2011 0802   CREATININE 1.16 04/30/2010 1122      Component Value Date/Time   CALCIUM 9.8 07/13/2012 0848   CALCIUM 8.8 12/29/2011 0802   CALCIUM 9.5 04/30/2010 1122   ALKPHOS 111 07/13/2012 0848   ALKPHOS 87* 12/29/2011 0802   ALKPHOS 90 04/30/2010 1122   AST 20 07/13/2012 0848   AST 25 12/29/2011 0802   AST 25 04/30/2010 1122   ALT 19 07/13/2012 0848   ALT 23 04/30/2010 1122   BILITOT 0.90 07/13/2012 0848   BILITOT 1.00 12/29/2011 0802   BILITOT 0.8 04/30/2010 1122       RADIOGRAPHIC STUDIES: Ct Chest W Contrast  07/13/2012  *RADIOLOGY REPORT*  Clinical Data: Lung cancer diagnosed in April 2010 status post wedge resection and completion of chemotherapy.  No chest complaints.  CT CHEST WITH CONTRAST  Technique:  Multidetector CT imaging of the chest was performed following the standard protocol during bolus administration of intravenous contrast.  Contrast: 80mL OMNIPAQUE IOHEXOL 300 MG/ML  SOLN  Comparison: Prior examinations 12/29/2011 and 06/26/2011.  Findings: There is a stable enhancing left thyroid nodule on image #7 (visible on prior studies dating back to 01/26/2009).  No enlarged mediastinal or hilar lymph nodes are present.  There is stable atherosclerosis of the aorta, great vessels and coronary arteries.  There is no pleural or pericardial effusion.  There are stable postsurgical changes status post right middle lobe resection. Linear scarring in the right mid lung and a small calcified right upper lobe granuloma are stable.  There is no suspicious pulmonary nodule or endobronchial finding.  The visualized upper abdomen appears unchanged.  There are no worrisome osseous findings.  Degenerative changes in the spine are stable.  IMPRESSION:  1.  Stable chest CT status post right middle lobe resection. 2.  No evidence of local recurrence or metastatic disease. 3.  Stable left thyroid nodule.   Original Report Authenticated By: Carey Bullocks, M.D.     ASSESSMENT: This is a  very pleasant 72 years old white male with metastatic non-small cell lung cancer, adenocarcinoma status post neoadjuvant chemotherapy followed by right middle lobectomy with lymph node dissection. The patient has been observation since August of 2010 with no evidence for disease recurrence.  PLAN: I discussed the scan results with the patient today. I recommended for him to continue on observation with repeat CT scan of the chest in 6 months.  He was advised to call me immediately if he has any concerning symptoms in the interval.  All questions were answered. The patient knows to call the clinic with any problems, questions or concerns. We can certainly see the patient much sooner if necessary.

## 2012-07-15 NOTE — Telephone Encounter (Signed)
appts made and printed for pt Pt aware that he will get a call/etter with the scan appt     anne

## 2012-07-15 NOTE — Patient Instructions (Signed)
No evidence for disease recurrence on the recent scan. Followup in 6 months with repeat CT scan of the chest.  

## 2012-11-03 ENCOUNTER — Other Ambulatory Visit: Payer: Self-pay | Admitting: Dermatology

## 2013-01-12 ENCOUNTER — Encounter (HOSPITAL_COMMUNITY): Payer: Self-pay

## 2013-01-12 ENCOUNTER — Ambulatory Visit (HOSPITAL_COMMUNITY)
Admission: RE | Admit: 2013-01-12 | Discharge: 2013-01-12 | Disposition: A | Discharge status: Home / Self Care | Payer: Medicare Other | Source: Ambulatory Visit | Attending: Internal Medicine | Admitting: Internal Medicine

## 2013-01-12 ENCOUNTER — Other Ambulatory Visit (HOSPITAL_BASED_OUTPATIENT_CLINIC_OR_DEPARTMENT_OTHER): Payer: Medicare Other | Admitting: Lab

## 2013-01-12 DIAGNOSIS — C3490 Malignant neoplasm of unspecified part of unspecified bronchus or lung: Secondary | ICD-10-CM

## 2013-01-12 LAB — CBC WITH DIFFERENTIAL/PLATELET
Basophils Absolute: 0.1 10*3/uL (ref 0.0–0.1)
EOS%: 2.3 % (ref 0.0–7.0)
HGB: 13.6 g/dL (ref 13.0–17.1)
MCH: 28.9 pg (ref 27.2–33.4)
MONO%: 10.3 % (ref 0.0–14.0)
NEUT#: 2.9 10*3/uL (ref 1.5–6.5)
RBC: 4.71 10*6/uL (ref 4.20–5.82)
RDW: 14.3 % (ref 11.0–14.6)
lymph#: 1.5 10*3/uL (ref 0.9–3.3)

## 2013-01-12 LAB — COMPREHENSIVE METABOLIC PANEL (CC13)
AST: 14 U/L (ref 5–34)
Albumin: 3.6 g/dL (ref 3.5–5.0)
BUN: 15.8 mg/dL (ref 7.0–26.0)
Calcium: 9.5 mg/dL (ref 8.4–10.4)
Chloride: 105 mEq/L (ref 98–109)
Glucose: 123 mg/dl (ref 70–140)
Potassium: 3.8 mEq/L (ref 3.5–5.1)
Sodium: 139 mEq/L (ref 136–145)
Total Protein: 7.5 g/dL (ref 6.4–8.3)

## 2013-01-12 MED ORDER — IOHEXOL 300 MG/ML  SOLN
80.0000 mL | Freq: Once | INTRAMUSCULAR | Status: AC | PRN
  Administered 2013-01-12: 80 mL via INTRAVENOUS

## 2013-01-13 ENCOUNTER — Telehealth: Payer: Self-pay | Admitting: Internal Medicine

## 2013-01-13 ENCOUNTER — Ambulatory Visit (HOSPITAL_BASED_OUTPATIENT_CLINIC_OR_DEPARTMENT_OTHER): Payer: Medicare Other | Admitting: Internal Medicine

## 2013-01-13 ENCOUNTER — Encounter: Payer: Self-pay | Admitting: Internal Medicine

## 2013-01-13 VITALS — BP 143/79 | HR 52 | Temp 97.0°F | Resp 18 | Ht 68.0 in | Wt 194.1 lb

## 2013-01-13 DIAGNOSIS — C3490 Malignant neoplasm of unspecified part of unspecified bronchus or lung: Secondary | ICD-10-CM

## 2013-01-13 NOTE — Patient Instructions (Addendum)
No evidence for disease recurrence on the recent scan.  Followup visit in 6 months with repeat CT scan of the chest. 

## 2013-01-13 NOTE — Progress Notes (Signed)
[facility] [phone]   Fax:([phone]  OFFICE PROGRESS NOTE  Garlan Fillers, MD [address] [facility], [place]. [place] [address]  DIAGNOSIS: Metastatic nonsmall cell lung cancer, adenocarcinoma, diagnosed in May 2010.   PRIOR THERAPY:  1. Status post 3 cycles of neoadjuvant chemotherapy with carboplatin and Alimta; last dose was given January 10, 2009, with complete resolution of the left upper lobe mass as well as the subcarinal lymphadenopathy and partial response in the right middle lobe lung lesion. 2. Status post right middle lobectomy with lymph node dissection under the care of Dr. 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  CURRENT THERAPY: Observation.  INTERVAL HISTORY: Mathew Hall 72 y.o. male returns to the clinic today for routine six-month followup visit accompanied by his wife. The patient is feeling fine today with no specific complaints. He denied having any significant chest pain, shortness of breath, cough or hemoptysis. He has no weight loss or night sweats. The patient had repeat CT scan of the chest performed recently and he is here for evaluation and discussion of his scan results.  MEDICAL HISTORY: Past Medical History  Diagnosis Date  . lung ca dx'd 10/2008    chemo comp 01/2009  . Hypertension   . GERD (gastroesophageal reflux disease)   . Hyperlipemia   . Heart attack   . Peptic stricture of esophagus   . Colon polyps   . Hemorrhoids   . Asthma     ALLERGIES:  is allergic to atorvastatin and lipitor.  MEDICATIONS:  Current Outpatient Prescriptions  Medication Sig Dispense Refill  . aspirin 325 MG EC tablet Take 325 mg by mouth daily.        . colesevelam (WELCHOL) 625 MG tablet Take 625 mg by mouth 2 (two) times daily with a meal.        . doxazosin  (CARDURA) 2 MG tablet Take 2 mg by mouth at bedtime.      . metoprolol tartrate (LOPRESSOR) 25 MG tablet Take 25 mg by mouth 2 (two) times daily.      Marland Kitchen NEXIUM 40 MG capsule TAKE 1 CAPSULE EVERY DAY  30 capsule  11  . simvastatin (ZOCOR) 40 MG tablet Take 40 mg by mouth every evening.      . nitroGLYCERIN (NITROSTAT) 0.4 MG SL tablet Place 0.4 mg under the tongue every 5 (five) minutes as needed.         No current facility-administered medications for this visit.    SURGICAL HISTORY:  Past Surgical History  Procedure Laterality Date  . Angioplasty      with 2 stent placement  . Lung surgery      right    REVIEW OF SYSTEMS:  A comprehensive review of systems was negative.   PHYSICAL EXAMINATION: General appearance: alert, cooperative and no distress Head: Normocephalic, without obvious abnormality, atraumatic Neck: no adenopathy Lymph nodes: Cervical, supraclavicular, and axillary nodes normal. Resp: clear to auscultation bilaterally Cardio: regular rate and rhythm, S1, S2 normal, no murmur, click, rub or gallop GI: soft, non-tender; bowel sounds normal; no masses,  no organomegaly Extremities: extremities normal, atraumatic, no cyanosis or edema  ECOG PERFORMANCE STATUS: 0 - Asymptomatic  Blood pressure 143/79, pulse 52, temperature 97 F (36.1 C), temperature source Oral, resp. rate 18, height 5\' 8"  (1.727 m), weight 194 lb 1.6 oz (88.043 kg).  LABORATORY DATA: Lab Results  Component Value Date   WBC 5.1 01/12/2013   HGB 13.6 01/12/2013   HCT 40.3 01/12/2013   MCV 85.6 01/12/2013   PLT 151 01/12/2013      Chemistry      Component Value Date/Time   NA 139 01/12/2013 0800   NA 138 12/29/2011 0802   NA 138 04/30/2010 1122   K 3.8 01/12/2013 0800   K 3.9 12/29/2011 0802   K 4.5 04/30/2010 1122   CL 105 07/13/2012 0848   CL 99 12/29/2011 0802   CL 104 04/30/2010 1122   CO2 26 01/12/2013 0800   CO2 28 12/29/2011 0802   CO2 27 04/30/2010 1122   BUN 15.8 01/12/2013 0800   BUN 14  12/29/2011 0802   BUN 17 04/30/2010 1122   CREATININE 1.1 01/12/2013 0800   CREATININE 1.1 12/29/2011 0802   CREATININE 1.16 04/30/2010 1122      Component Value Date/Time   CALCIUM 9.5 01/12/2013 0800   CALCIUM 8.8 12/29/2011 0802   CALCIUM 9.5 04/30/2010 1122   ALKPHOS 93 01/12/2013 0800   ALKPHOS 87* 12/29/2011 0802   ALKPHOS 90 04/30/2010 1122   AST 14 01/12/2013 0800   AST 25 12/29/2011 0802   AST 25 04/30/2010 1122   ALT 15 01/12/2013 0800   ALT 23 04/30/2010 1122   BILITOT 0.55 01/12/2013 0800   BILITOT 1.00 12/29/2011 0802   BILITOT 0.8 04/30/2010 1122       RADIOGRAPHIC STUDIES: Ct Chest W Contrast  01/12/2013   *RADIOLOGY REPORT*  Clinical Data: Staging lung cancer  CT CHEST WITH CONTRAST  Technique:  Multidetector CT imaging of the chest was performed following the standard protocol during bolus administration of intravenous contrast.  Contrast: 80mL OMNIPAQUE IOHEXOL 300 MG/ML  SOLN  Comparison: 07/13/2012  Findings: There is no pleural effusion.  No airspace consolidation identified.  Mild changes of centrilobular emphysema noted.  Postoperative changes within the right middle lobe identified.  No specific features identified to suggest residual or recurrent local tumor.  Heart size is normal.  No pericardial effusion.  There is no enlarged mediastinal or hilar adenopathy.  No axillary or supraclavicular adenopathy.  The visualized osseous structures are remarkable for a thoracic spondylosis.  There is no aggressive lytic or sclerotic bone lesion noted.  Limited imaging through the upper abdomen shows no acute findings. The adrenal glands appear within normal limits.  IMPRESSION:  1.  Stable exam.  There are no specific features identified to suggest residual or recurrence of tumor.   Original Report Authenticated By: Signa Kell, M.D.    ASSESSMENT AND PLAN: This is a very pleasant 73 years old white male with history of metastatic non-small cell lung cancer status post neoadjuvant  chemotherapy followed by right middle lobectomy with lymph node dissection has been observation since August of 2010 with no evidence for disease recurrence. I discussed the scan results with the patient and his wife. I recommended for him to continue on observation with repeat CT scan of the chest in 6 months. He was advised to call immediately if he has any concerning symptoms in the interval.  All questions were answered. The patient knows to call the clinic with any problems, questions or concerns. We can certainly see the patient much sooner if necessary.

## 2013-01-13 NOTE — Telephone Encounter (Signed)
gv and printed appt sched and avs forl pt. °

## 2013-02-23 ENCOUNTER — Encounter: Payer: Self-pay | Admitting: Cardiology

## 2013-02-23 DIAGNOSIS — I2510 Atherosclerotic heart disease of native coronary artery without angina pectoris: Secondary | ICD-10-CM

## 2013-02-24 ENCOUNTER — Encounter: Payer: Self-pay | Admitting: Cardiovascular Disease

## 2013-02-25 ENCOUNTER — Ambulatory Visit: Payer: Medicare Other | Admitting: Cardiovascular Disease

## 2013-03-15 ENCOUNTER — Telehealth: Payer: Self-pay | Admitting: Cardiovascular Disease

## 2013-03-15 NOTE — Telephone Encounter (Signed)
Had episode of vomiting,diarrhea,clammy and BP went real low-called EMS and they told him to contact Dr Allyson Sabal to see if he needs to be seen today.

## 2013-03-15 NOTE — Telephone Encounter (Signed)
Returned call to Bentley, pt's wife.  Stated pt does this about once per month and pt did this about 4 nights ago.  Stated pt gets cold and shakes and usually gets better.  Stated pt went to the restroom and wasn't able to make it back to the room.  Stated he made it to the couch, laid his head back and his eyes started rolling in the back of his head.  Stated he was fine by the time EMT got there and has been fine since.  Stated they just told them that pt's BP was low and they needed to call Dr. Allyson Sabal to see if he needs to be seen today.  Informed Dr. Allyson Sabal doesn't have any appts prior to his scheduled appt on Thursday and NP will be notified to review chart and advise further.  Also asked wife to try to locate BP cuff as she was unable to locate it during call.  Agreed w/ plan  Message forwarded to L. Annie Paras, NP for further instructions.

## 2013-03-15 NOTE — Telephone Encounter (Signed)
Returned call and informed pt per instructions by NP.  Pt also given orthostatic precautions.  Pt verbalized understanding and agreed w/ plan.  Message forwarded to Dr. Allyson Sabal [facility]).

## 2013-03-15 NOTE — Telephone Encounter (Signed)
Hold the doxazosin until he sees Dr. Allyson Sabal.  If this happens again this week go to ER.  Increase fluids today even with gatorade.  Why vomiting and diarrhea?  Is this new?  For diarrhea, vomiting see primary MD.  He may have had vaso-vagel symptoms after GI issues.  Hold the doxazosin for now though.

## 2013-03-17 ENCOUNTER — Encounter: Payer: Self-pay | Admitting: Cardiovascular Disease

## 2013-03-17 ENCOUNTER — Ambulatory Visit (INDEPENDENT_AMBULATORY_CARE_PROVIDER_SITE_OTHER): Payer: Medicare Other | Admitting: Cardiovascular Disease

## 2013-03-17 VITALS — BP 150/86 | HR 65 | Ht 70.0 in | Wt 192.0 lb

## 2013-03-17 DIAGNOSIS — I10 Essential (primary) hypertension: Secondary | ICD-10-CM

## 2013-03-17 DIAGNOSIS — C3490 Malignant neoplasm of unspecified part of unspecified bronchus or lung: Secondary | ICD-10-CM

## 2013-03-17 DIAGNOSIS — I219 Acute myocardial infarction, unspecified: Secondary | ICD-10-CM

## 2013-03-17 DIAGNOSIS — E785 Hyperlipidemia, unspecified: Secondary | ICD-10-CM

## 2013-03-17 MED ORDER — SIMVASTATIN 40 MG PO TABS: 40.0000 mg | ORAL_TABLET | Freq: Every evening | ORAL | Status: DC

## 2013-03-17 MED ORDER — NITROGLYCERIN 0.4 MG SL SUBL: 0.4000 mg | SUBLINGUAL_TABLET | SUBLINGUAL | Status: DC | PRN

## 2013-03-17 NOTE — Assessment & Plan Note (Signed)
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 He did have a 40% ostial left main stenosis with an EF of 40% at that time with inferior akinesia. A followup echo performed in 2008 showed normal LV systolic function and a Myoview stress test performed 11/21/09 was normal as well. He denies chest pain or shortness of breath.

## 2013-03-17 NOTE — Assessment & Plan Note (Signed)
Under good control on current medications 

## 2013-03-17 NOTE — Patient Instructions (Addendum)
Your physician wants you to follow-up in: 1 year with Dr Berry. You will receive a reminder letter in the mail two months in advance. If you don't receive a letter, please call our office to schedule the follow-up appointment.  

## 2013-03-17 NOTE — Assessment & Plan Note (Signed)
On statin therapy followed by his PCP 

## 2013-03-17 NOTE — Progress Notes (Signed)
03/17/2013 Mathew Hall   May 16, 1941  [id number]  Primary Physician Garlan Fillers, MD Primary Cardiologist: Runell Gess MD Mathew Hall   HPI:  The patient is a 72 year old mildly overweight married Caucasian male father of 2, grandfather of 6 grandchildren who I saw 15 months ago. He has a history of CAD status post acute inferior wall myocardial infarction treated percutaneously with stenting February 18, 1998 with intervention of his ostium and mid dominant RCA. He did have a 40% ostial left main stenosis and an EF of 40%, inferior akinesis at that time. He had a normal abdominal aorta, renal arteries and iliac arteries. Followup echo performed in 2008 showed normal LV systolic function, and his most recent Myoview performed Nov 21, 2009 was normal as well. He denies chest pain or shortness of breath. His other problems include hypertension, hyperlipidemia and GERD. Dr. Eloise Harman follows his lipid profile.     Current Outpatient Prescriptions  Medication Sig Dispense Refill  . aspirin 325 MG EC tablet Take 325 mg by mouth daily.        . colesevelam (WELCHOL) 625 MG tablet Take 625 mg by mouth. 2 tablets twice a day      . doxazosin (CARDURA) 2 MG tablet Take 2 mg by mouth at bedtime.      . metoprolol tartrate (LOPRESSOR) 25 MG tablet Take 25 mg by mouth 2 (two) times daily.      Marland Kitchen NEXIUM 40 MG capsule TAKE 1 CAPSULE EVERY DAY  30 capsule  11  . nitroGLYCERIN (NITROSTAT) 0.4 MG SL tablet Place 0.4 mg under the tongue every 5 (five) minutes as needed.        . simvastatin (ZOCOR) 40 MG tablet Take 40 mg by mouth every evening.       No current facility-administered medications for this visit.    Allergies  Allergen Reactions  . Atorvastatin   . Lipitor [Atorvastatin Calcium]     History   Social History  . Marital Status: Married    Spouse Name: N/A    Number of Children: 2  . Years of Education: N/A   Occupational History  . retired    Social  History Main Topics  . Smoking status: Former Smoker    Quit date: 07/16/1997  . Smokeless tobacco: Never Used  . Alcohol Use: Yes     Comment: occasionally  . Drug Use: No  . Sexual Activity: Not on file   Other Topics Concern  . Not on file   Social History Narrative  . No narrative on file     Review of Systems: General: negative for chills, fever, night sweats or weight changes.  Cardiovascular: negative for chest pain, dyspnea on exertion, edema, orthopnea, palpitations, paroxysmal nocturnal dyspnea or shortness of breath Dermatological: negative for rash Respiratory: negative for cough or wheezing Urologic: negative for hematuria Abdominal: negative for nausea, vomiting, diarrhea, bright red blood per rectum, melena, or hematemesis Neurologic: negative for visual changes, syncope, or dizziness All other systems reviewed and are otherwise negative except as noted above.    Blood pressure 150/86, pulse 65, height 5\' 10"  (1.778 m), weight 192 lb (87.091 kg).  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 He did have a 40% ostial left main stenosis with an EF of 40% at that time with inferior akinesia. A followup echo performed in 2008 showed normal LV systolic function and a Myoview stress test performed 11/21/09 was normal as well. He denies chest pain or shortness of breath.  HYPERTENSION Under good control on current  medications  HYPERLIPIDEMIA On statin therapy followed by his PCP      Runell Gess MD [facility], [facility] 03/17/2013 9:55 AM

## 2013-04-05 ENCOUNTER — Other Ambulatory Visit: Payer: Self-pay | Admitting: Cardiovascular Disease

## 2013-07-13 ENCOUNTER — Other Ambulatory Visit (HOSPITAL_BASED_OUTPATIENT_CLINIC_OR_DEPARTMENT_OTHER): Payer: Medicare Other

## 2013-07-13 ENCOUNTER — Encounter (HOSPITAL_COMMUNITY): Payer: Self-pay

## 2013-07-13 ENCOUNTER — Ambulatory Visit (HOSPITAL_COMMUNITY)
Admission: RE | Admit: 2013-07-13 | Discharge: 2013-07-13 | Disposition: A | Discharge status: Home / Self Care | Payer: Medicare Other | Source: Ambulatory Visit | Attending: Internal Medicine | Admitting: Internal Medicine

## 2013-07-13 DIAGNOSIS — J438 Other emphysema: Secondary | ICD-10-CM | POA: Insufficient documentation

## 2013-07-13 DIAGNOSIS — C3490 Malignant neoplasm of unspecified part of unspecified bronchus or lung: Secondary | ICD-10-CM | POA: Insufficient documentation

## 2013-07-13 DIAGNOSIS — C342 Malignant neoplasm of middle lobe, bronchus or lung: Secondary | ICD-10-CM

## 2013-07-13 DIAGNOSIS — K449 Diaphragmatic hernia without obstruction or gangrene: Secondary | ICD-10-CM | POA: Insufficient documentation

## 2013-07-13 LAB — COMPREHENSIVE METABOLIC PANEL (CC13)
ALBUMIN: 4.1 g/dL (ref 3.5–5.0)
ALT: 16 U/L (ref 0–55)
ANION GAP: 9 meq/L (ref 3–11)
AST: 17 U/L (ref 5–34)
Alkaline Phosphatase: 103 U/L (ref 40–150)
BUN: 16.7 mg/dL (ref 7.0–26.0)
CALCIUM: 9.9 mg/dL (ref 8.4–10.4)
CHLORIDE: 104 meq/L (ref 98–109)
CO2: 26 meq/L (ref 22–29)
Creatinine: 1.1 mg/dL (ref 0.7–1.3)
Glucose: 115 mg/dl (ref 70–140)
POTASSIUM: 4 meq/L (ref 3.5–5.1)
SODIUM: 140 meq/L (ref 136–145)
TOTAL PROTEIN: 8.3 g/dL (ref 6.4–8.3)
Total Bilirubin: 0.79 mg/dL (ref 0.20–1.20)

## 2013-07-13 LAB — CBC WITH DIFFERENTIAL/PLATELET
BASO%: 1.4 % (ref 0.0–2.0)
Basophils Absolute: 0.1 10*3/uL (ref 0.0–0.1)
EOS%: 2.1 % (ref 0.0–7.0)
Eosinophils Absolute: 0.1 10*3/uL (ref 0.0–0.5)
HEMATOCRIT: 43.1 % (ref 38.4–49.9)
HGB: 14.7 g/dL (ref 13.0–17.1)
LYMPH#: 1.8 10*3/uL (ref 0.9–3.3)
LYMPH%: 30.1 % (ref 14.0–49.0)
MCH: 29.1 pg (ref 27.2–33.4)
MCHC: 34 g/dL (ref 32.0–36.0)
MCV: 85.6 fL (ref 79.3–98.0)
MONO#: 0.5 10*3/uL (ref 0.1–0.9)
MONO%: 9 % (ref 0.0–14.0)
NEUT#: 3.4 10*3/uL (ref 1.5–6.5)
NEUT%: 57.4 % (ref 39.0–75.0)
Platelets: 190 10*3/uL (ref 140–400)
RBC: 5.04 10*6/uL (ref 4.20–5.82)
RDW: 13.9 % (ref 11.0–14.6)
WBC: 5.8 10*3/uL (ref 4.0–10.3)

## 2013-07-13 MED ORDER — IOHEXOL 300 MG/ML  SOLN
80.0000 mL | Freq: Once | INTRAMUSCULAR | Status: AC | PRN
  Administered 2013-07-13: 80 mL via INTRAVENOUS

## 2013-07-14 ENCOUNTER — Ambulatory Visit (HOSPITAL_BASED_OUTPATIENT_CLINIC_OR_DEPARTMENT_OTHER): Payer: Medicare Other | Admitting: Internal Medicine

## 2013-07-14 ENCOUNTER — Encounter (INDEPENDENT_AMBULATORY_CARE_PROVIDER_SITE_OTHER): Payer: Self-pay

## 2013-07-14 ENCOUNTER — Encounter: Payer: Self-pay | Admitting: Internal Medicine

## 2013-07-14 VITALS — BP 149/81 | HR 52 | Temp 97.8°F | Resp 18 | Ht 70.0 in | Wt 189.3 lb

## 2013-07-14 DIAGNOSIS — C3490 Malignant neoplasm of unspecified part of unspecified bronchus or lung: Secondary | ICD-10-CM

## 2013-07-14 DIAGNOSIS — I10 Essential (primary) hypertension: Secondary | ICD-10-CM

## 2013-07-14 DIAGNOSIS — C342 Malignant neoplasm of middle lobe, bronchus or lung: Secondary | ICD-10-CM

## 2013-07-14 NOTE — Patient Instructions (Signed)
Followup visit in 6 months with repeat CT scan of the chest.

## 2013-07-14 NOTE — Progress Notes (Signed)
[place] [phone]   Fax:([phone]) [phone]  OFFICE PROGRESS NOTE  Donnajean Lopes, MD [place] [address]  DIAGNOSIS: Metastatic nonsmall cell lung cancer, adenocarcinoma, diagnosed in May 2010.   PRIOR THERAPY:  1. Status post 3 cycles of neoadjuvant chemotherapy with carboplatin and Alimta; last dose was given January 10, 2009, with complete resolution of the left upper lobe mass as well as the subcarinal lymphadenopathy and partial response in the right middle lobe lung lesion. 2. Status post right middle lobectomy with lymph node dissection under the care of Dr. 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  CURRENT THERAPY: Observation.  INTERVAL HISTORY: Mathew Hall 73 y.o. male returns to the clinic today for routine six-month followup visit accompanied by his wife. The patient is feeling fine today with no specific complaints. He denied having any significant chest pain, but has some shortness of breath with exertion, no cough or hemoptysis. He has no weight loss or night sweats. The patient had repeat CT scan of the chest performed recently and he is here for evaluation and discussion of his scan results.  MEDICAL HISTORY: Past Medical History  Diagnosis Date  . lung ca dx'd 10/2008    chemo comp 01/2009  . Hypertension   . GERD (gastroesophageal reflux disease)   . Hyperlipemia   . Peptic stricture of esophagus   . Colon polyps   . Hemorrhoids   . Asthma   . 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 CAD (coronary artery disease) 1999    stents to RCA, residual 40% Lt main, decreased EF40%   . H/O cardiovascular stress test 1999/2005/2008/2011    last with inf scar/infarct, EF 54%  . H/O echocardiogram 2008    EF 50-55%, Mild MR, Mild TR,    . Right carotid bruit  04/01/12    minimal carotid stenosis on doppler    ALLERGIES:  is allergic to atorvastatin and lipitor.  MEDICATIONS:  Current Outpatient Prescriptions  Medication Sig Dispense Refill  . aspirin 325 MG EC tablet Take 325 mg by mouth daily.        . colesevelam (WELCHOL) 625 MG tablet Take 625 mg by mouth. 2 tablets twice a day      . metoprolol tartrate (LOPRESSOR) 25 MG tablet Take 25 mg by mouth 2 (two) times daily.      Marland Kitchen NEXIUM 40 MG capsule TAKE 1 CAPSULE EVERY DAY  30 capsule  11  . nitroGLYCERIN (NITROSTAT) 0.4 MG SL tablet Place 1 tablet (0.4 mg total) under the tongue every 5 (five) minutes as needed.  25 tablet  6  . simvastatin (ZOCOR) 40 MG tablet Take 1 tablet (40 mg total) by mouth every evening.  30 tablet  11   No current facility-administered medications for this visit.    SURGICAL HISTORY:  Past Surgical History  Procedure Laterality Date  . Angioplasty      with 2 stent placement  . Lung surgery      right  . Coronary angioplasty with stent placement  02/18/1998    PCI/stents to ostial and mid RCA, emergently, residual Lt main disease    REVIEW OF SYSTEMS:  A comprehensive review of systems was negative.   PHYSICAL EXAMINATION: General appearance: alert, cooperative and no distress Head: Normocephalic, without obvious abnormality, atraumatic Neck: no adenopathy Lymph  nodes: Cervical, supraclavicular, and axillary nodes normal. Resp: clear to auscultation bilaterally Cardio: regular rate and rhythm, S1, S2 normal, no murmur, click, rub or gallop GI: soft, non-tender; bowel sounds normal; no masses,  no organomegaly Extremities: extremities normal, atraumatic, no cyanosis or edema  ECOG PERFORMANCE STATUS: 0 - Asymptomatic  Blood pressure 149/81, pulse 52, temperature 97.8 F (36.6 C), temperature source Oral, resp. rate 18, height 5\' 10"  (1.778 m), weight 189 lb 4.8 oz (85.866 kg), SpO2 99.00%.  LABORATORY DATA: Lab Results  Component Value Date   WBC  5.8 07/13/2013   HGB 14.7 07/13/2013   HCT 43.1 07/13/2013   MCV 85.6 07/13/2013   PLT 190 07/13/2013      Chemistry      Component Value Date/Time   NA 140 07/13/2013 0752   NA 138 12/29/2011 0802   NA 138 04/30/2010 1122   K 4.0 07/13/2013 0752   K 3.9 12/29/2011 0802   K 4.5 04/30/2010 1122   CL 105 07/13/2012 0848   CL 99 12/29/2011 0802   CL 104 04/30/2010 1122   CO2 26 07/13/2013 0752   CO2 28 12/29/2011 0802   CO2 27 04/30/2010 1122   BUN 16.7 07/13/2013 0752   BUN 14 12/29/2011 0802   BUN 17 04/30/2010 1122   CREATININE 1.1 07/13/2013 0752   CREATININE 1.1 12/29/2011 0802   CREATININE 1.16 04/30/2010 1122      Component Value Date/Time   CALCIUM 9.9 07/13/2013 0752   CALCIUM 8.8 12/29/2011 0802   CALCIUM 9.5 04/30/2010 1122   ALKPHOS 103 07/13/2013 0752   ALKPHOS 87* 12/29/2011 0802   ALKPHOS 90 04/30/2010 1122   AST 17 07/13/2013 0752   AST 25 12/29/2011 0802   AST 25 04/30/2010 1122   ALT 16 07/13/2013 0752   ALT 21 12/29/2011 0802   ALT 23 04/30/2010 1122   BILITOT 0.79 07/13/2013 0752   BILITOT 1.00 12/29/2011 0802   BILITOT 0.8 04/30/2010 1122       RADIOGRAPHIC STUDIES:  Ct Chest W Contrast  07/13/2013   CLINICAL DATA:  History of lung cancer, 2010.  EXAM: CT CHEST WITH CONTRAST  TECHNIQUE: Multidetector CT imaging of the chest was performed during intravenous contrast administration.  CONTRAST:  17mL OMNIPAQUE IOHEXOL 300 MG/ML  SOLN  COMPARISON:  Chest CT 01/12/2013; 12/29/2011.  FINDINGS: Visualized thyroid is unremarkable. No enlarged axillary, mediastinal or hilar lymphadenopathy. Normal heart size. No pericardial effusion. Normal caliber aorta and main pulmonary artery.  Stable postoperative changes involving the right middle lobe. No CT evidence for recurrent disease. Centrilobular emphysema.  Visualization of the upper abdomen demonstrates normal bilateral adrenal glands. Small hiatal hernia. No aggressive or acute appearing osseous lesions.  IMPRESSION: Stable examination.  No CT  evidence for recurrent disease.   Electronically Signed   By: Lovey Newcomer M.D.   On: 07/13/2013 10:49   ASSESSMENT AND PLAN: This is a very pleasant 72 years old white male with history of metastatic non-small cell lung cancer status post neoadjuvant chemotherapy followed by right middle lobectomy with lymph node dissection has been observation since August of 2010 with no evidence for disease recurrence. I discussed the scan results with the patient and his wife. I recommended for him to continue on observation with repeat CT scan of the chest in 6 months. He was advised to call immediately if he has any concerning symptoms in the interval.  All questions were answered. The patient knows to call the clinic with any problems,  questions or concerns. We can certainly see the patient much sooner if necessary.

## 2013-08-15 ENCOUNTER — Encounter: Payer: Self-pay | Admitting: Internal Medicine

## 2013-10-10 ENCOUNTER — Other Ambulatory Visit: Payer: Self-pay | Admitting: *Deleted

## 2013-10-10 DIAGNOSIS — C3490 Malignant neoplasm of unspecified part of unspecified bronchus or lung: Secondary | ICD-10-CM

## 2013-10-10 MED ORDER — COLESEVELAM HCL 625 MG PO TABS: 1250.0000 mg | ORAL_TABLET | Freq: Two times a day (BID) | ORAL | Status: DC

## 2013-10-10 NOTE — Telephone Encounter (Signed)
Rx refill sent to patients pharmacy  

## 2013-10-26 ENCOUNTER — Other Ambulatory Visit: Payer: Self-pay

## 2013-10-26 DIAGNOSIS — C3490 Malignant neoplasm of unspecified part of unspecified bronchus or lung: Secondary | ICD-10-CM

## 2013-10-26 MED ORDER — COLESEVELAM HCL 625 MG PO TABS: 1250.0000 mg | ORAL_TABLET | Freq: Two times a day (BID) | ORAL | Status: DC

## 2013-10-26 NOTE — Telephone Encounter (Signed)
Rx was sent to pharmacy electronically. 

## 2014-01-03 ENCOUNTER — Telehealth: Payer: Self-pay | Admitting: Internal Medicine

## 2014-01-03 NOTE — Telephone Encounter (Signed)
RETUNRED PT CALL AND SCHED PT APPT FOR LAB AND EST....NO POF SENT...PT OK AND AWARE OF APPTS

## 2014-01-11 ENCOUNTER — Ambulatory Visit (HOSPITAL_COMMUNITY)
Admission: RE | Admit: 2014-01-11 | Discharge: 2014-01-11 | Disposition: A | Discharge status: Home / Self Care | Payer: Medicare Other | Source: Ambulatory Visit | Attending: Internal Medicine | Admitting: Internal Medicine

## 2014-01-11 ENCOUNTER — Other Ambulatory Visit (HOSPITAL_BASED_OUTPATIENT_CLINIC_OR_DEPARTMENT_OTHER): Payer: Medicare Other

## 2014-01-11 DIAGNOSIS — C3490 Malignant neoplasm of unspecified part of unspecified bronchus or lung: Secondary | ICD-10-CM

## 2014-01-11 DIAGNOSIS — C342 Malignant neoplasm of middle lobe, bronchus or lung: Secondary | ICD-10-CM

## 2014-01-11 DIAGNOSIS — Z902 Acquired absence of lung [part of]: Secondary | ICD-10-CM | POA: Insufficient documentation

## 2014-01-11 DIAGNOSIS — I2510 Atherosclerotic heart disease of native coronary artery without angina pectoris: Secondary | ICD-10-CM | POA: Insufficient documentation

## 2014-01-11 DIAGNOSIS — I700 Atherosclerosis of aorta: Secondary | ICD-10-CM | POA: Insufficient documentation

## 2014-01-11 DIAGNOSIS — Z9221 Personal history of antineoplastic chemotherapy: Secondary | ICD-10-CM | POA: Insufficient documentation

## 2014-01-11 DIAGNOSIS — J438 Other emphysema: Secondary | ICD-10-CM | POA: Insufficient documentation

## 2014-01-11 DIAGNOSIS — K449 Diaphragmatic hernia without obstruction or gangrene: Secondary | ICD-10-CM | POA: Insufficient documentation

## 2014-01-11 LAB — COMPREHENSIVE METABOLIC PANEL (CC13)
ALBUMIN: 4 g/dL (ref 3.5–5.0)
ALT: 18 U/L (ref 0–55)
ANION GAP: 8 meq/L (ref 3–11)
AST: 17 U/L (ref 5–34)
Alkaline Phosphatase: 89 U/L (ref 40–150)
BUN: 15.6 mg/dL (ref 7.0–26.0)
CHLORIDE: 104 meq/L (ref 98–109)
CO2: 25 meq/L (ref 22–29)
Calcium: 9.9 mg/dL (ref 8.4–10.4)
Creatinine: 1.3 mg/dL (ref 0.7–1.3)
GLUCOSE: 119 mg/dL (ref 70–140)
POTASSIUM: 4.1 meq/L (ref 3.5–5.1)
Sodium: 138 mEq/L (ref 136–145)
Total Bilirubin: 0.91 mg/dL (ref 0.20–1.20)
Total Protein: 7.8 g/dL (ref 6.4–8.3)

## 2014-01-11 LAB — CBC WITH DIFFERENTIAL/PLATELET
BASO%: 1.1 % (ref 0.0–2.0)
BASOS ABS: 0.1 10*3/uL (ref 0.0–0.1)
EOS%: 2.4 % (ref 0.0–7.0)
Eosinophils Absolute: 0.1 10*3/uL (ref 0.0–0.5)
HCT: 42.7 % (ref 38.4–49.9)
HEMOGLOBIN: 14.1 g/dL (ref 13.0–17.1)
LYMPH#: 1.7 10*3/uL (ref 0.9–3.3)
LYMPH%: 31.7 % (ref 14.0–49.0)
MCH: 28.7 pg (ref 27.2–33.4)
MCHC: 33 g/dL (ref 32.0–36.0)
MCV: 87.1 fL (ref 79.3–98.0)
MONO#: 0.6 10*3/uL (ref 0.1–0.9)
MONO%: 11.4 % (ref 0.0–14.0)
NEUT#: 2.9 10*3/uL (ref 1.5–6.5)
NEUT%: 53.4 % (ref 39.0–75.0)
PLATELETS: 178 10*3/uL (ref 140–400)
RBC: 4.9 10*6/uL (ref 4.20–5.82)
RDW: 13.8 % (ref 11.0–14.6)
WBC: 5.4 10*3/uL (ref 4.0–10.3)

## 2014-01-16 ENCOUNTER — Ambulatory Visit (HOSPITAL_BASED_OUTPATIENT_CLINIC_OR_DEPARTMENT_OTHER): Payer: Medicare Other | Admitting: Physician Assistant

## 2014-01-16 VITALS — BP 145/76 | HR 53 | Temp 97.5°F | Resp 19 | Ht 70.0 in | Wt 188.1 lb

## 2014-01-16 DIAGNOSIS — C342 Malignant neoplasm of middle lobe, bronchus or lung: Secondary | ICD-10-CM

## 2014-01-16 DIAGNOSIS — C3490 Malignant neoplasm of unspecified part of unspecified bronchus or lung: Secondary | ICD-10-CM

## 2014-01-17 ENCOUNTER — Encounter: Payer: Self-pay | Admitting: Physician Assistant

## 2014-01-17 NOTE — Patient Instructions (Signed)
Your CT scan revealed stable disease without any evidence of disease recurrence or disease progression He will remain on observation and return in one year with a restaging CT scan of your chest to reevaluate your disease

## 2014-01-17 NOTE — Progress Notes (Addendum)
[facility] [phone]   Fax:([phone]) [phone]  SHARED VISIT PROGRESS NOTE  Donnajean Lopes, MD [place] [address]  DIAGNOSIS: Metastatic nonsmall cell lung cancer, adenocarcinoma, diagnosed in May 2010.   PRIOR THERAPY:  1. Status post 3 cycles of neoadjuvant chemotherapy with carboplatin and Alimta; last dose was given January 10, 2009, with complete resolution of the left upper lobe mass as well as the subcarinal lymphadenopathy and partial response in the right middle lobe lung lesion. 2. Status post right middle lobectomy with lymph node dissection under the care of Dr. 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  CURRENT THERAPY: Observation.  INTERVAL HISTORY: Mathew Hall 73 y.o. male returns to the clinic today for routine six-month followup visit accompanied by his wife. The patient is feeling fine today with no specific complaints. He denied having any significant chest pain, but continues to have some shortness of breath with exertion. He is able to perform his activities of daily living including keep grams mood without difficulty. He does not to take breaks but is able to do this by himself. He denies cough or hemoptysis. He has no weight loss or night sweats. The patient had repeat CT scan of the chest performed recently and he is here for evaluation and discussion of his scan results.  MEDICAL HISTORY: Past Medical History  Diagnosis Date  . lung ca dx'd 10/2008    chemo comp 01/2009  . Hypertension   . GERD (gastroesophageal reflux disease)   . Hyperlipemia   . Peptic stricture of esophagus   . Colon polyps   . Hemorrhoids   . Asthma   . 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 CAD (coronary artery disease) 1999    stents to RCA, residual 40% Lt main, decreased  EF40%   . H/O cardiovascular stress test 1999/2005/2008/2011    last with inf scar/infarct, EF 54%  . H/O echocardiogram 2008    EF 50-55%, Mild MR, Mild TR,    . Right carotid bruit 04/01/12    minimal carotid stenosis on doppler    ALLERGIES:  is allergic to atorvastatin and lipitor.  MEDICATIONS:  Current Outpatient Prescriptions  Medication Sig Dispense Refill  . aspirin 325 MG EC tablet Take 325 mg by mouth daily.        . colesevelam (WELCHOL) 625 MG tablet Take 2 tablets (1,250 mg total) by mouth 2 (two) times daily with a meal.  120 tablet  6  . metoprolol tartrate (LOPRESSOR) 25 MG tablet Take 25 mg by mouth 2 (two) times daily.      Marland Kitchen NEXIUM 40 MG capsule TAKE 1 CAPSULE EVERY DAY  30 capsule  11  . simvastatin (ZOCOR) 40 MG tablet Take 1 tablet (40 mg total) by mouth every evening.  30 tablet  11  . nitroGLYCERIN (NITROSTAT) 0.4 MG SL tablet Place 1 tablet (0.4 mg total) under the tongue every 5 (five) minutes as needed.  25 tablet  6   No current facility-administered medications for this visit.    SURGICAL HISTORY:  Past Surgical History  Procedure Laterality Date  . Angioplasty      with 2 stent placement  . Lung surgery      right  . Coronary angioplasty with stent placement  02/18/1998    PCI/stents to ostial and mid RCA,  emergently, residual Lt main disease    REVIEW OF SYSTEMS:  A comprehensive review of systems was negative.   PHYSICAL EXAMINATION: General appearance: alert, cooperative and no distress Head: Normocephalic, without obvious abnormality, atraumatic Neck: no adenopathy Lymph nodes: Cervical, supraclavicular, and axillary nodes normal. Resp: clear to auscultation bilaterally Cardio: regular rate and rhythm, S1, S2 normal, no murmur, click, rub or gallop GI: soft, non-tender; bowel sounds normal; no masses,  no organomegaly Extremities: extremities normal, atraumatic, no cyanosis or edema  ECOG PERFORMANCE STATUS: 0 - Asymptomatic  Blood  pressure 145/76, pulse 53, temperature 97.5 F (36.4 C), temperature source Oral, resp. rate 19, height 5' 10"  (1.778 m), weight 188 lb 1.6 oz (85.322 kg), SpO2 100.00%.  LABORATORY DATA: Lab Results  Component Value Date   WBC 5.4 01/11/2014   HGB 14.1 01/11/2014   HCT 42.7 01/11/2014   MCV 87.1 01/11/2014   PLT 178 01/11/2014      Chemistry      Component Value Date/Time   NA 138 01/11/2014 0752   NA 138 12/29/2011 0802   NA 138 04/30/2010 1122   K 4.1 01/11/2014 0752   K 3.9 12/29/2011 0802   K 4.5 04/30/2010 1122   CL 105 07/13/2012 0848   CL 99 12/29/2011 0802   CL 104 04/30/2010 1122   CO2 25 01/11/2014 0752   CO2 28 12/29/2011 0802   CO2 27 04/30/2010 1122   BUN 15.6 01/11/2014 0752   BUN 14 12/29/2011 0802   BUN 17 04/30/2010 1122   CREATININE 1.3 01/11/2014 0752   CREATININE 1.1 12/29/2011 0802   CREATININE 1.16 04/30/2010 1122      Component Value Date/Time   CALCIUM 9.9 01/11/2014 0752   CALCIUM 8.8 12/29/2011 0802   CALCIUM 9.5 04/30/2010 1122   ALKPHOS 89 01/11/2014 0752   ALKPHOS 87* 12/29/2011 0802   ALKPHOS 90 04/30/2010 1122   AST 17 01/11/2014 0752   AST 25 12/29/2011 0802   AST 25 04/30/2010 1122   ALT 18 01/11/2014 0752   ALT 21 12/29/2011 0802   ALT 23 04/30/2010 1122   BILITOT 0.91 01/11/2014 0752   BILITOT 1.00 12/29/2011 0802   BILITOT 0.8 04/30/2010 1122       RADIOGRAPHIC STUDIES: Ct Chest Wo Contrast  01/11/2014   CLINICAL DATA:  Restaging of lung cancer, diagnosed 2010, chemotherapy complete, status post right lung surgery  EXAM: CT CHEST WITHOUT CONTRAST  TECHNIQUE: Multidetector CT imaging of the chest was performed following the standard protocol without IV contrast.  COMPARISON:  07/13/2013  FINDINGS: Status post right middle lobectomy.  No suspicious pulmonary nodules. Mild emphysematous changes. No pleural effusion or pneumothorax.  Visualized left thyroid is mildly nodular.  The heart is normal in size. No pericardial effusion. Coronary atherosclerosis. Atherosclerotic  calcifications of the aortic arch.  No suspicious mediastinal or axillary lymphadenopathy.  Visualized upper abdomen is notable for a small hiatal hernia and vascular calcifications.  Degenerative changes of the visualized thoracolumbar spine.  IMPRESSION: Status post right middle lobectomy.  No evidence of recurrent or metastatic disease.   Electronically Signed   By: Julian Hy M.D.   On: 01/11/2014 09:22    ASSESSMENT AND PLAN: This is a very pleasant 73 years old white male with history of metastatic non-small cell lung cancer status post neoadjuvant chemotherapy followed by right middle lobectomy with lymph node dissection has been observation since August of 2010 with no evidence for disease recurrence. The patient was discussed with and also  seen by Dr. Julien Nordmann. His restaging CT scan revealed stable disease without evidence of recurrence or progression. He is now a little over 5 years out from diagnosis. We will plan to see him in one year with repeat CBC differential C. met and CT of the chest without contrast to reevaluate his disease.   He was advised to call immediately if he has any concerning symptoms in the interval.  All questions were answered. The patient knows to call the clinic with any problems, questions or concerns. We can certainly see the patient much sooner if necessary.   Carlton Adam PA-C  ADDENDUM: Hematology/Oncology Attending: I had a face to face encounter with the patient. I recommended his care plan. This is a very pleasant 73 years old white male with history of metastatic non-small cell lung cancer, adenocarcinoma status post 3 cycles of neoadjuvant chemotherapy followed by surgical resection and has been observation since August of 2010. His recent scan showed no evidence for disease recurrence. I discussed the scan results with the patient and his wife. I recommended for him to continue on annual observation with repeat CT scan of the chest. He was  advised to call immediately if he has any concerning symptoms in the interval.  Disclaimer: This note was dictated with voice recognition software. Similar sounding words can inadvertently be transcribed and may not be corrected upon review. Eilleen Kempf., MD 01/21/2014

## 2014-01-18 ENCOUNTER — Telehealth: Payer: Self-pay | Admitting: Internal Medicine

## 2014-01-18 NOTE — Telephone Encounter (Signed)
Called to give pt appts ordered for 1 year. Gave appts for lab on 01/12/15 @ 8am and advised pt radiology would call him with inctructions for ct scan same day, and gave pt aoot for md on 01/15/15 @ 8.45am. Pt verbalized understanding. Also mailed pt appt calendar.

## 2014-03-08 ENCOUNTER — Encounter: Payer: Self-pay | Admitting: Internal Medicine

## 2014-03-09 ENCOUNTER — Other Ambulatory Visit (HOSPITAL_COMMUNITY): Payer: Self-pay | Admitting: Cardiovascular Disease

## 2014-03-09 ENCOUNTER — Telehealth (HOSPITAL_COMMUNITY): Payer: Self-pay | Admitting: *Deleted

## 2014-03-09 DIAGNOSIS — R0989 Other specified symptoms and signs involving the circulatory and respiratory systems: Secondary | ICD-10-CM

## 2014-03-10 ENCOUNTER — Encounter: Payer: Self-pay | Admitting: Internal Medicine

## 2014-04-06 ENCOUNTER — Other Ambulatory Visit: Payer: Self-pay | Admitting: Cardiovascular Disease

## 2014-04-07 NOTE — Telephone Encounter (Signed)
Rx was sent to pharmacy electronically. 

## 2014-04-11 ENCOUNTER — Ambulatory Visit (HOSPITAL_COMMUNITY)
Admission: RE | Admit: 2014-04-11 | Discharge: 2014-04-11 | Disposition: A | Discharge status: Home / Self Care | Payer: Medicare Other | Source: Ambulatory Visit | Attending: Cardiovascular Disease | Admitting: Cardiovascular Disease

## 2014-04-11 ENCOUNTER — Encounter: Payer: Self-pay | Admitting: Cardiovascular Disease

## 2014-04-11 ENCOUNTER — Ambulatory Visit (INDEPENDENT_AMBULATORY_CARE_PROVIDER_SITE_OTHER): Payer: Medicare Other | Admitting: Cardiovascular Disease

## 2014-04-11 VITALS — BP 142/88 | HR 51 | Ht 70.0 in | Wt 186.0 lb

## 2014-04-11 DIAGNOSIS — I2510 Atherosclerotic heart disease of native coronary artery without angina pectoris: Secondary | ICD-10-CM

## 2014-04-11 DIAGNOSIS — R0989 Other specified symptoms and signs involving the circulatory and respiratory systems: Secondary | ICD-10-CM | POA: Diagnosis not present

## 2014-04-11 DIAGNOSIS — I10 Essential (primary) hypertension: Secondary | ICD-10-CM

## 2014-04-11 MED ORDER — NITROGLYCERIN 0.4 MG SL SUBL: 0.4000 mg | SUBLINGUAL_TABLET | SUBLINGUAL | Status: DC | PRN

## 2014-04-11 MED ORDER — SIMVASTATIN 40 MG PO TABS: 40.0000 mg | ORAL_TABLET | Freq: Every evening | ORAL | Status: DC

## 2014-04-11 NOTE — Progress Notes (Signed)
Carotid Duplex Completed. Vaibhav Fogleman, BS, RDMS, RVT  

## 2014-04-11 NOTE — Assessment & Plan Note (Signed)
On statin therapy followed by his PCP 

## 2014-04-11 NOTE — Patient Instructions (Signed)
Your physician wants you to follow-up in: 1 year with Dr Berry. You will receive a reminder letter in the mail two months in advance. If you don't receive a letter, please call our office to schedule the follow-up appointment.  

## 2014-04-11 NOTE — Progress Notes (Signed)
04/11/2014 Fayne Mediate   11-07-1940  [id number]  Primary Physician Donnajean Lopes, MD Primary Cardiologist: Lorretta Harp MD [name]   HPI:  The patient is a 73 year old mildly overweight married Caucasian male father of 2, grandfather of 6 grandchildren who I saw 13 months ago. He has a history of CAD status post acute inferior wall myocardial infarction treated percutaneously with stenting February 18, 1998 with intervention of his ostium and mid dominant RCA. He did have a 40% ostial left main stenosis and an EF of 40%, inferior akinesis at that time. He had a normal abdominal aorta, renal arteries and iliac arteries. Followup echo performed in 2008 showed normal LV systolic function, and his most recent Myoview performed Nov 21, 2009 was normal as well. He denies chest pain or shortness of breath. His other problems include hypertension, hyperlipidemia and GERD. Dr. Philip Aspen follows his lipid profile.   Current Outpatient Prescriptions  Medication Sig Dispense Refill  . aspirin 325 MG EC tablet Take 325 mg by mouth daily.        . colesevelam (WELCHOL) 625 MG tablet Take 2 tablets (1,250 mg total) by mouth 2 (two) times daily with a meal.  120 tablet  6  . metoprolol tartrate (LOPRESSOR) 25 MG tablet Take 25 mg by mouth 2 (two) times daily.      Marland Kitchen NEXIUM 40 MG capsule TAKE 1 CAPSULE EVERY DAY  30 capsule  11  . simvastatin (ZOCOR) 40 MG tablet Take 1 tablet (40 mg total) by mouth every evening.  30 tablet  11  . nitroGLYCERIN (NITROSTAT) 0.4 MG SL tablet Place 1 tablet (0.4 mg total) under the tongue every 5 (five) minutes as needed for chest pain.  90 tablet  3   No current facility-administered medications for this visit.    Allergies  Allergen Reactions  . Atorvastatin   . Lipitor [Atorvastatin Calcium]     History   Social History  . Marital Status: Married    Spouse Name: N/A    Number of Children: 2  . Years of Education: N/A   Occupational  History  . retired    Social History Main Topics  . Smoking status: Former Smoker    Quit date: 07/16/1997  . Smokeless tobacco: Never Used  . Alcohol Use: Yes     Comment: occasionally  . Drug Use: No  . Sexual Activity: Not on file   Other Topics Concern  . Not on file   Social History Narrative  . No narrative on file     Review of Systems: General: negative for chills, fever, night sweats or weight changes.  Cardiovascular: negative for chest pain, dyspnea on exertion, edema, orthopnea, palpitations, paroxysmal nocturnal dyspnea or shortness of breath Dermatological: negative for rash Respiratory: negative for cough or wheezing Urologic: negative for hematuria Abdominal: negative for nausea, vomiting, diarrhea, bright red blood per rectum, melena, or hematemesis Neurologic: negative for visual changes, syncope, or dizziness All other systems reviewed and are otherwise negative except as noted above.    Blood pressure 142/88, pulse 51, height 5\' 10"  (1.778 m), weight 186 lb (84.369 kg).  General appearance: alert and no distress Neck: no adenopathy, no carotid bruit, no JVD, supple, symmetrical, trachea midline and thyroid not enlarged, symmetric, no tenderness/mass/nodules Lungs: clear to auscultation bilaterally Heart: regular rate and rhythm, S1, S2 normal, no murmur, click, rub or gallop Extremities: extremities normal, atraumatic, no cyanosis or edema  EKG sinus bradycardia at 51 with  no ST or T wave changes  ASSESSMENT AND PLAN:   HYPERLIPIDEMIA On statin therapy followed by his PCP  Essential hypertension Controlled on current medications  CAD (coronary artery disease), emergent stent to ostial RCA and mid RCA with an acute inferior MI in 1999, residual left main disease 40% at that time, last week's study 2011 with inferior scar no isc History of CAD status post inferior STEMI with PCI and stenting 02/18/98 with intervention on the ostium and midportion of  his dominant RCA. He did have 40% ostial left main stenosis with an EF of 40% and inferior akinesia. Subsequent echo performed in 2008 showed normal LV systolic function and mildly performed 11/21/09 was normal as well. He denies chest pain or shortness of breath.      Lorretta Harp MD FACP,FACC,FAHA, [facility] 04/11/2014 7:54 AM

## 2014-04-11 NOTE — Assessment & Plan Note (Signed)
Controlled on current medications 

## 2014-04-11 NOTE — Assessment & Plan Note (Signed)
History of CAD status post inferior STEMI with PCI and stenting 02/18/98 with intervention on the ostium and midportion of his dominant RCA. He did have 40% ostial left main stenosis with an EF of 40% and inferior akinesia. Subsequent echo performed in 2008 showed normal LV systolic function and mildly performed 11/21/09 was normal as well. He denies chest pain or shortness of breath.

## 2014-04-14 ENCOUNTER — Encounter: Payer: Self-pay | Admitting: *Deleted

## 2014-04-27 ENCOUNTER — Ambulatory Visit (AMBULATORY_SURGERY_CENTER): Payer: Self-pay | Admitting: *Deleted

## 2014-04-27 VITALS — Ht 70.0 in | Wt 188.6 lb

## 2014-04-27 DIAGNOSIS — Z86010 Personal history of colonic polyps: Secondary | ICD-10-CM

## 2014-04-27 MED ORDER — MOVIPREP 100 G PO SOLR: 1.0000 | Freq: Once | ORAL | Status: DC

## 2014-04-27 NOTE — Progress Notes (Signed)
No egg or soy allergy. ewm No home 02 use. ewm No diet pills. ewm No blood thinners. ewm No e mail. ewm No problems or issues with past sedation. ewm Pt states no new issues cardiac wise, just saw cardio with good report. ewm

## 2014-05-03 ENCOUNTER — Encounter: Payer: Self-pay | Admitting: Internal Medicine

## 2014-05-10 ENCOUNTER — Encounter: Payer: Self-pay | Admitting: Internal Medicine

## 2014-05-10 ENCOUNTER — Ambulatory Visit (AMBULATORY_SURGERY_CENTER): Payer: Medicare Other | Admitting: Internal Medicine

## 2014-05-10 VITALS — BP 117/67 | HR 57 | Temp 97.8°F | Resp 19 | Ht 70.0 in | Wt 188.0 lb

## 2014-05-10 DIAGNOSIS — D122 Benign neoplasm of ascending colon: Secondary | ICD-10-CM

## 2014-05-10 DIAGNOSIS — Z86010 Personal history of colonic polyps: Secondary | ICD-10-CM

## 2014-05-10 DIAGNOSIS — D123 Benign neoplasm of transverse colon: Secondary | ICD-10-CM

## 2014-05-10 MED ORDER — SODIUM CHLORIDE 0.9 % IV SOLN: 500.0000 mL | INTRAVENOUS | Status: DC

## 2014-05-10 NOTE — Progress Notes (Signed)
Patient awakening,vss,report to rn 

## 2014-05-10 NOTE — Patient Instructions (Signed)
YOU HAD AN ENDOSCOPIC PROCEDURE TODAY AT THE [facility]: Refer to the procedure report that was given to you for any specific questions about what was found during the examination.  If the procedure report does not answer your questions, please call your gastroenterologist to clarify.  If you requested that your care partner not be given the details of your procedure findings, then the procedure report has been included in a sealed envelope for you to review at your convenience later.  YOU SHOULD EXPECT: Some feelings of bloating in the abdomen. Passage of more gas than usual.  Walking can help get rid of the air that was put into your GI tract during the procedure and reduce the bloating. If you had a lower endoscopy (such as a colonoscopy or flexible sigmoidoscopy) you may notice spotting of blood in your stool or on the toilet paper. If you underwent a bowel prep for your procedure, then you may not have a normal bowel movement for a few days.  DIET: Your first meal following the procedure should be a light meal and then it is ok to progress to your normal diet.  A half-sandwich or bowl of soup is an example of a good first meal.  Heavy or fried foods are harder to digest and may make you feel nauseous or bloated.  Likewise meals heavy in dairy and vegetables can cause extra gas to form and this can also increase the bloating.  Drink plenty of fluids but you should avoid alcoholic beverages for 24 hours.  ACTIVITY: Your care partner should take you home directly after the procedure.  You should plan to take it easy, moving slowly for the rest of the day.  You can resume normal activity the day after the procedure however you should NOT DRIVE or use heavy machinery for 24 hours (because of the sedation medicines used during the test).    SYMPTOMS TO REPORT IMMEDIATELY: A gastroenterologist can be reached at any hour.  During normal business hours, 8:30 AM to 5:00 PM Monday through Friday,  call [phone].  After hours and on weekends, please call the GI answering service at [phone] who will take a message and have the physician on call contact you.   Following lower endoscopy (colonoscopy or flexible sigmoidoscopy):  Excessive amounts of blood in the stool  Significant tenderness or worsening of abdominal pains  Swelling of the abdomen that is new, acute  Fever of 100F or higher  FOLLOW UP: If any biopsies were taken you will be contacted by phone or by letter within the next 1-3 weeks.  Call your gastroenterologist if you have not heard about the biopsies in 3 weeks.  Our staff will call the home number listed on your records the next business day following your procedure to check on you and address any questions or concerns that you may have at that time regarding the information given to you following your procedure. This is a courtesy call and so if there is no answer at the home number and we have not heard from you through the emergency physician on call, we will assume that you have returned to your regular daily activities without incident.  SIGNATURES/CONFIDENTIALITY: You and/or your care partner have signed paperwork which will be entered into your electronic medical record.  These signatures attest to the fact that that the information above on your After Visit Summary has been reviewed and is understood.  Full responsibility of the confidentiality of this   discharge information lies with you and/or your care-partner.  Resume medications. Information given on polyps with discharge instructions.

## 2014-05-10 NOTE — Progress Notes (Signed)
Called to room to assist during endoscopic procedure.  Patient ID and intended procedure confirmed with present staff. Received instructions for my participation in the procedure from the performing physician.  

## 2014-05-10 NOTE — Op Note (Signed)
[place]  [address] [place], [address]   COLONOSCOPY PROCEDURE REPORT  PATIENT: Mathew, Hall  MR#: [id number] BIRTHDATE: 05-04-1941 , 73  yrs. old GENDER: male ENDOSCOPIST: Eustace Quail, MD REFERRED [id number] Program Recall PROCEDURE DATE:  05/10/2014 PROCEDURE:   Colonoscopy with snare polypectomy x 4 First Screening Colonoscopy - Avg.  risk and is 50 yrs.  old or older - No.  Prior Negative Screening - Now for repeat screening. N/A  History of Adenoma - Now for follow-up colonoscopy & has been > or = to 3 yrs.  Yes hx of adenoma.  Has been 3 or more years since last colonoscopy.  Polyps Removed Today? Yes. ASA CLASS:   Class II INDICATIONS:surveillance colonoscopy based on a history of adenomatous colonic polyp(s).Index 2005 (62mm TA); 2010 (hp) MEDICATIONS: Monitored anesthesia care and Propofol 250 mg IV DESCRIPTION OF PROCEDURE:   After the risks benefits and alternatives of the procedure were thoroughly explained, informed consent was obtained.  The digital rectal exam revealed no abnormalities of the rectum.   The LB [id number]  endoscope was introduced through the anus and advanced to the cecum, which was identified by both the appendix and ileocecal valve. No adverse events experienced.   The quality of the prep was excellent, using MoviPrep  The instrument was then slowly withdrawn as the colon was fully examined.  COLON FINDINGS: Four polyps were found; 3 in the transverse colon (2-39mm) and ascending colon (80mm)   A polypectomy was performed with a cold snare.  The resection was complete, the polyp tissue was completely retrieved and sent to histology.   The examination was otherwise normal.  Retroflexed views revealed internal hemorrhoids. The time to cecum=2 minutes 14 seconds.  Withdrawal time=14 minutes 34 seconds.  The scope was withdrawn and the procedure completed. COMPLICATIONS: There were no immediate  complications.  ENDOSCOPIC IMPRESSION: 1.   Four polyps were found in the transverse colon and ascending colon; polypectomy was performed with a cold snare 2.   The examination was otherwise normal  RECOMMENDATIONS: 1. Repeat Colonoscopy in 3 years.  eSigned:  Eustace Quail, MD 05/10/2014 10:24 AM   cc: [name], MD and The Patient   PATIENT NAME:  Mathew, Hall MR#: [id number]

## 2014-05-11 ENCOUNTER — Telehealth: Payer: Self-pay | Admitting: *Deleted

## 2014-05-11 NOTE — Telephone Encounter (Signed)
  Follow up Call-  Call back number 05/10/2014  Post procedure Call Back phone  # [phone]  Permission to leave phone message Yes     Patient questions:  Do you have a fever, pain , or abdominal swelling? No. Pain Score  0 *  Have you tolerated food without any problems? Yes.    Have you been able to return to your normal activities? Yes.    Do you have any questions about your discharge instructions: Diet   No. Medications  No. Follow up visit  No.  Do you have questions or concerns about your Care? No.  Actions: * If pain score is 4 or above: No action needed, pain <4.

## 2014-05-16 ENCOUNTER — Encounter: Payer: Self-pay | Admitting: Internal Medicine

## 2014-12-22 ENCOUNTER — Encounter: Payer: Self-pay | Admitting: Internal Medicine

## 2015-01-12 ENCOUNTER — Other Ambulatory Visit (HOSPITAL_BASED_OUTPATIENT_CLINIC_OR_DEPARTMENT_OTHER): Payer: Medicare Other

## 2015-01-12 ENCOUNTER — Ambulatory Visit (HOSPITAL_COMMUNITY)
Admission: RE | Admit: 2015-01-12 | Discharge: 2015-01-12 | Disposition: A | Discharge status: Home / Self Care | Payer: Medicare Other | Source: Ambulatory Visit | Attending: Physician Assistant | Admitting: Physician Assistant

## 2015-01-12 ENCOUNTER — Encounter (HOSPITAL_COMMUNITY): Payer: Self-pay

## 2015-01-12 DIAGNOSIS — C3490 Malignant neoplasm of unspecified part of unspecified bronchus or lung: Secondary | ICD-10-CM

## 2015-01-12 DIAGNOSIS — R911 Solitary pulmonary nodule: Secondary | ICD-10-CM | POA: Insufficient documentation

## 2015-01-12 DIAGNOSIS — Z85118 Personal history of other malignant neoplasm of bronchus and lung: Secondary | ICD-10-CM | POA: Diagnosis not present

## 2015-01-12 DIAGNOSIS — C3491 Malignant neoplasm of unspecified part of right bronchus or lung: Secondary | ICD-10-CM | POA: Diagnosis present

## 2015-01-12 LAB — COMPREHENSIVE METABOLIC PANEL (CC13)
ALK PHOS: 86 U/L (ref 40–150)
ALT: 13 U/L (ref 0–55)
AST: 16 U/L (ref 5–34)
Albumin: 3.8 g/dL (ref 3.5–5.0)
Anion Gap: 8 mEq/L (ref 3–11)
BILIRUBIN TOTAL: 0.98 mg/dL (ref 0.20–1.20)
BUN: 16.7 mg/dL (ref 7.0–26.0)
CO2: 25 mEq/L (ref 22–29)
CREATININE: 1.2 mg/dL (ref 0.7–1.3)
Calcium: 9.8 mg/dL (ref 8.4–10.4)
Chloride: 107 mEq/L (ref 98–109)
EGFR: 60 mL/min/{1.73_m2} — ABNORMAL LOW (ref >90)
GLUCOSE: 102 mg/dL (ref 70–140)
Potassium: 4 mEq/L (ref 3.5–5.1)
Sodium: 140 mEq/L (ref 136–145)
Total Protein: 7.3 g/dL (ref 6.4–8.3)

## 2015-01-12 LAB — CBC WITH DIFFERENTIAL/PLATELET
BASO%: 0.8 % (ref 0.0–2.0)
BASOS ABS: 0 10*3/uL (ref 0.0–0.1)
EOS ABS: 0.1 10*3/uL (ref 0.0–0.5)
EOS%: 3 % (ref 0.0–7.0)
HCT: 40 % (ref 38.4–49.9)
HGB: 13.5 g/dL (ref 13.0–17.1)
LYMPH#: 1.5 10*3/uL (ref 0.9–3.3)
LYMPH%: 31.7 % (ref 14.0–49.0)
MCH: 29.5 pg (ref 27.2–33.4)
MCHC: 33.8 g/dL (ref 32.0–36.0)
MCV: 87.5 fL (ref 79.3–98.0)
MONO#: 0.6 10*3/uL (ref 0.1–0.9)
MONO%: 12.9 % (ref 0.0–14.0)
NEUT%: 51.6 % (ref 39.0–75.0)
NEUTROS ABS: 2.4 10*3/uL (ref 1.5–6.5)
PLATELETS: 161 10*3/uL (ref 140–400)
RBC: 4.57 10*6/uL (ref 4.20–5.82)
RDW: 13.5 % (ref 11.0–14.6)
WBC: 4.7 10*3/uL (ref 4.0–10.3)

## 2015-01-12 MED ORDER — IOHEXOL 300 MG/ML  SOLN
100.0000 mL | Freq: Once | INTRAMUSCULAR | Status: AC | PRN
  Administered 2015-01-12: 80 mL via INTRAVENOUS

## 2015-01-15 ENCOUNTER — Encounter: Payer: Self-pay | Admitting: Internal Medicine

## 2015-01-15 ENCOUNTER — Ambulatory Visit (HOSPITAL_BASED_OUTPATIENT_CLINIC_OR_DEPARTMENT_OTHER): Payer: Medicare Other | Admitting: Internal Medicine

## 2015-01-15 VITALS — BP 141/74 | HR 46 | Temp 97.8°F | Resp 18 | Ht 70.0 in | Wt 182.0 lb

## 2015-01-15 DIAGNOSIS — R911 Solitary pulmonary nodule: Secondary | ICD-10-CM | POA: Diagnosis not present

## 2015-01-15 DIAGNOSIS — Z85118 Personal history of other malignant neoplasm of bronchus and lung: Secondary | ICD-10-CM

## 2015-01-15 DIAGNOSIS — C342 Malignant neoplasm of middle lobe, bronchus or lung: Secondary | ICD-10-CM

## 2015-01-15 NOTE — Progress Notes (Signed)
[place] [phone]   Fax:(336) [phone]  OFFICE PROGRESS NOTE  Donnajean Lopes, MD [place] [address]  DIAGNOSIS: Metastatic nonsmall cell lung cancer, adenocarcinoma, diagnosed in May 2010.   PRIOR THERAPY:  1. Status post 3 cycles of neoadjuvant chemotherapy with carboplatin and Alimta; last dose was given January 10, 2009, with complete resolution of the left upper lobe mass as well as the subcarinal lymphadenopathy and partial response in the right middle lobe lung lesion. 2. Status post right middle lobectomy with lymph node dissection under the care of Dr. 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  CURRENT THERAPY: Observation.  INTERVAL HISTORY: Mathew Hall 74 y.o. male returns to the clinic today for routine annual followup visit. The patient is feeling fine today with no specific complaints. He denied having any significant chest pain, no shortness of breath with exertion, no cough or hemoptysis. He has no weight loss or night sweats. The patient had repeat CT scan of the chest performed recently and he is here for evaluation and discussion of his scan results.  MEDICAL HISTORY: Past Medical History  Diagnosis Date  . Hypertension   . GERD (gastroesophageal reflux disease)   . Hyperlipemia   . Peptic stricture of esophagus   . Colon polyps   . Hemorrhoids   . Asthma   . 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 CAD (coronary artery disease) 1999    stents to RCA, residual 40% Lt main, decreased EF40%   . H/O cardiovascular stress test 1999/2005/2008/2011    last with inf scar/infarct, EF 54%  . H/O echocardiogram 2008    EF 50-55%, Mild MR, Mild TR,    . Right carotid bruit 04/01/12    minimal carotid stenosis on doppler  . lung ca dx'd 10/2008    chemo  comp 01/2009-stage 4 lung cancer  . Myocardial infarction 02-18-1998  . Cataract     bilateral-beginning stages    ALLERGIES:  is allergic to lipitor and atorvastatin.  MEDICATIONS:  Current Outpatient Prescriptions  Medication Sig Dispense Refill  . aspirin 325 MG EC tablet Take 325 mg by mouth daily.      . colesevelam (WELCHOL) 625 MG tablet Take 2 tablets (1,250 mg total) by mouth 2 (two) times daily with a meal. 120 tablet 6  . metoprolol tartrate (LOPRESSOR) 25 MG tablet Take 25 mg by mouth 2 (two) times daily.    Marland Kitchen NEXIUM 40 MG capsule TAKE 1 CAPSULE EVERY DAY 30 capsule 11  . nitroGLYCERIN (NITROSTAT) 0.4 MG SL tablet Place 1 tablet (0.4 mg total) under the tongue every 5 (five) minutes as needed for chest pain. 90 tablet 3  . oxybutynin (DITROPAN) 5 MG tablet TAKE 1 TABLET BY MOUTH EVERY 8 HOURS AS NEEDED FOR BLADDER SPASMS  2  . simvastatin (ZOCOR) 40 MG tablet Take 1 tablet (40 mg total) by mouth every evening. 30 tablet 11   No current facility-administered medications for this visit.    SURGICAL HISTORY:  Past Surgical History  Procedure Laterality Date  . Angioplasty      with 2 stent placement  . Lung surgery      right  . Coronary angioplasty with stent placement  02/18/1998    PCI/stents to ostial and mid RCA, emergently, residual Lt main disease  . Colonoscopy    .  Polypectomy      HPP 09-21-08    REVIEW OF SYSTEMS:  A comprehensive review of systems was negative.   PHYSICAL EXAMINATION: General appearance: alert, cooperative and no distress Head: Normocephalic, without obvious abnormality, atraumatic Neck: no adenopathy Lymph nodes: Cervical, supraclavicular, and axillary nodes normal. Resp: clear to auscultation bilaterally Cardio: regular rate and rhythm, S1, S2 normal, no murmur, click, rub or gallop GI: soft, non-tender; bowel sounds normal; no masses,  no organomegaly Extremities: extremities normal, atraumatic, no cyanosis or edema  ECOG PERFORMANCE  STATUS: 0 - Asymptomatic  Blood pressure 141/74, pulse 46, temperature 97.8 F (36.6 C), temperature source Oral, resp. rate 18, height '5\' 10"'$  (1.778 m), weight 182 lb (82.555 kg), SpO2 99 %.  LABORATORY DATA: Lab Results  Component Value Date   WBC 4.7 01/12/2015   HGB 13.5 01/12/2015   HCT 40.0 01/12/2015   MCV 87.5 01/12/2015   PLT 161 01/12/2015      Chemistry      Component Value Date/Time   NA 140 01/12/2015 0822   NA 138 12/29/2011 0802   NA 138 04/30/2010 1122   K 4.0 01/12/2015 0822   K 3.9 12/29/2011 0802   K 4.5 04/30/2010 1122   CL 105 07/13/2012 0848   CL 99 12/29/2011 0802   CL 104 04/30/2010 1122   CO2 25 01/12/2015 0822   CO2 28 12/29/2011 0802   CO2 27 04/30/2010 1122   BUN 16.7 01/12/2015 0822   BUN 14 12/29/2011 0802   BUN 17 04/30/2010 1122   CREATININE 1.2 01/12/2015 0822   CREATININE 1.1 12/29/2011 0802   CREATININE 1.16 04/30/2010 1122      Component Value Date/Time   CALCIUM 9.8 01/12/2015 0822   CALCIUM 8.8 12/29/2011 0802   CALCIUM 9.5 04/30/2010 1122   ALKPHOS 86 01/12/2015 0822   ALKPHOS 87* 12/29/2011 0802   ALKPHOS 90 04/30/2010 1122   AST 16 01/12/2015 0822   AST 25 12/29/2011 0802   AST 25 04/30/2010 1122   ALT 13 01/12/2015 0822   ALT 21 12/29/2011 0802   ALT 23 04/30/2010 1122   BILITOT 0.98 01/12/2015 0822   BILITOT 1.00 12/29/2011 0802   BILITOT 0.8 04/30/2010 1122       RADIOGRAPHIC STUDIES: Ct Chest W Contrast  01/12/2015   CLINICAL DATA:  Right lung cancer.  EXAM: CT CHEST WITH CONTRAST  TECHNIQUE: Multidetector CT imaging of the chest was performed during intravenous contrast administration.  CONTRAST:  73m OMNIPAQUE IOHEXOL 300 MG/ML  SOLN  COMPARISON:  01/11/2014  FINDINGS: Mediastinum: Normal heart size. No pericardial effusion. Calcified atherosclerotic disease involves the thoracic aorta. Calcifications within the RCA Coronary artery noted. There is no mediastinal or hilar adenopathy noted. The trachea is patent  and appears midline. Normal appearance of the esophagus.  Lungs/Pleura: No pleural effusion identified. Mild changes of centrilobular emphysema. Postoperative change within the right lung again noted. There is a small nodular density in the right lower lobe which appears new from previous exam measuring 4 mm, image 30 of series 5. The appearance is nonspecific but warrants attention on follow-up imaging.  Upper Abdomen: No suspicious liver abnormality. Small stone within the dependent portion of the gallbladder. The adrenal glands are unremarkable. The visualized portions of the spleen are normal.  Musculoskeletal: Spondylosis noted within the thoracic spine. There are no aggressive lytic or sclerotic bone lesions identified.  IMPRESSION: 1. No specific features identified to suggest residual or recurrence of tumor within the chest. No evidence  for metastatic disease. 2. Nonspecific pulmonary nodule in the right lung measures 4 mm. Attention on followup imaging is recommended.   Electronically Signed   By: Kerby Moors M.D.   On: 01/12/2015 09:21   ASSESSMENT AND PLAN: This is a very pleasant 74 years old white male with history of metastatic non-small cell lung cancer status post neoadjuvant chemotherapy followed by right middle lobectomy with lymph node dissection has been observation since August of 2010 with no evidence for disease recurrence. We will continue to monitor the nonspecific 4 mm nodule closely on the upcoming scan. I discussed the scan results with the patient and his wife. I recommended for him to continue on observation with repeat CT scan of the chest in one year. He was advised to call immediately if he has any concerning symptoms in the interval.  All questions were answered. The patient knows to call the clinic with any problems, questions or concerns. We can certainly see the patient much sooner if necessary.  Disclaimer: This note was dictated with voice recognition software.  Similar sounding words can inadvertently be transcribed and may be missed upon review.

## 2015-04-17 ENCOUNTER — Telehealth: Payer: Self-pay | Admitting: Internal Medicine

## 2015-04-17 NOTE — Telephone Encounter (Signed)
returned call and s.w. pt and gv all appts for JULY 2017.Marland KitchenMarland KitchenMarland Kitchenpt ok and aware

## 2015-05-04 ENCOUNTER — Other Ambulatory Visit: Payer: Self-pay | Admitting: Cardiovascular Disease

## 2015-06-02 ENCOUNTER — Other Ambulatory Visit: Payer: Self-pay | Admitting: Cardiovascular Disease

## 2015-06-05 ENCOUNTER — Other Ambulatory Visit: Payer: Self-pay | Admitting: Cardiovascular Disease

## 2015-06-05 NOTE — Telephone Encounter (Signed)
°*  STAT* If patient is at the pharmacy, call can be transferred to refill team.   1. Which medications need to be refilled? (please list name of each medication and dose if known) Simvastatin '40mg'$   2. Which pharmacy/location (including street and city if local pharmacy) is medication to be sent to? [facility] on [address]  3. Do they need a 30 day or 90 day supply? [place]

## 2015-06-06 MED ORDER — SIMVASTATIN 40 MG PO TABS: 40.0000 mg | ORAL_TABLET | Freq: Every evening | ORAL | Status: DC

## 2015-06-06 NOTE — Telephone Encounter (Signed)
Rx(s) sent to pharmacy electronically.  

## 2015-07-13 ENCOUNTER — Encounter: Payer: Self-pay | Admitting: Cardiovascular Disease

## 2015-07-13 ENCOUNTER — Ambulatory Visit (INDEPENDENT_AMBULATORY_CARE_PROVIDER_SITE_OTHER): Payer: Medicare Other | Admitting: Cardiovascular Disease

## 2015-07-13 ENCOUNTER — Ambulatory Visit: Payer: Medicare Other | Admitting: Cardiovascular Disease

## 2015-07-13 VITALS — BP 118/70 | HR 53 | Ht 70.0 in | Wt 184.5 lb

## 2015-07-13 DIAGNOSIS — I10 Essential (primary) hypertension: Secondary | ICD-10-CM

## 2015-07-13 DIAGNOSIS — I2510 Atherosclerotic heart disease of native coronary artery without angina pectoris: Secondary | ICD-10-CM | POA: Diagnosis not present

## 2015-07-13 DIAGNOSIS — I158 Other secondary hypertension: Secondary | ICD-10-CM | POA: Diagnosis not present

## 2015-07-13 MED ORDER — NITROGLYCERIN 0.4 MG SL SUBL: 0.4000 mg | SUBLINGUAL_TABLET | SUBLINGUAL | Status: DC | PRN

## 2015-07-13 MED ORDER — SIMVASTATIN 40 MG PO TABS: 40.0000 mg | ORAL_TABLET | Freq: Every evening | ORAL | Status: DC

## 2015-07-13 MED ORDER — COLESEVELAM HCL 625 MG PO TABS: 1250.0000 mg | ORAL_TABLET | Freq: Two times a day (BID) | ORAL | Status: DC

## 2015-07-13 NOTE — Progress Notes (Signed)
07/13/2015 Fayne Mediate   04-05-1941  [id number]  Primary Physician Donnajean Lopes, MD Primary Cardiologist: Lorretta Harp MD [name]   HPI:   The patient is a 75 year old mildly overweight married Caucasian male father of 2, grandfather of 6 grandchildren who I saw 04/11/14. He has a history of CAD status post acute inferior wall myocardial infarction treated percutaneously with stenting February 18, 1998 with intervention of his ostium and mid dominant RCA. He did have a 40% ostial left main stenosis and an EF of 40%, inferior akinesis at that time. He had a normal abdominal aorta, renal arteries and iliac arteries. Followup echo performed in 2008 showed normal LV systolic function, and his most recent Myoview performed Nov 21, 2009 was normal as well. He denies chest pain or shortness of breath. His other problems include hypertension, hyperlipidemia and GERD. Dr. Philip Aspen follows his lipid profile.   Current Outpatient Prescriptions  Medication Sig Dispense Refill  . aspirin 325 MG EC tablet Take 325 mg by mouth daily.      . colesevelam (WELCHOL) 625 MG tablet Take 2 tablets (1,250 mg total) by mouth 2 (two) times daily with a meal. 120 tablet 6  . esomeprazole (NEXIUM) 20 MG capsule Take 20 mg by mouth daily at 12 noon.    . metoprolol tartrate (LOPRESSOR) 25 MG tablet Take 25 mg by mouth 2 (two) times daily.    . nitroGLYCERIN (NITROSTAT) 0.4 MG SL tablet Place 1 tablet (0.4 mg total) under the tongue every 5 (five) minutes as needed for chest pain. 90 tablet 3  . oxybutynin (DITROPAN) 5 MG tablet TAKE 1 TABLET BY MOUTH EVERY 8 HOURS AS NEEDED FOR BLADDER SPASMS  2  . simvastatin (ZOCOR) 40 MG tablet Take 1 tablet (40 mg total) by mouth every evening. 90 tablet 3   No current facility-administered medications for this visit.    Allergies  Allergen Reactions  . Lipitor [Atorvastatin Calcium]     Knee pain   . Atorvastatin Other (See Comments)   Joint/knee pain    Social History   Social History  . Marital Status: Married    Spouse Name: N/A  . Number of Children: 2  . Years of Education: N/A   Occupational History  . retired    Social History Main Topics  . Smoking status: Former Smoker    Quit date: 07/16/1997  . Smokeless tobacco: Never Used  . Alcohol Use: Yes     Comment: occasionally  . Drug Use: No  . Sexual Activity: Not on file   Other Topics Concern  . Not on file   Social History Narrative     Review of Systems: General: negative for chills, fever, night sweats or weight changes.  Cardiovascular: negative for chest pain, dyspnea on exertion, edema, orthopnea, palpitations, paroxysmal nocturnal dyspnea or shortness of breath Dermatological: negative for rash Respiratory: negative for cough or wheezing Urologic: negative for hematuria Abdominal: negative for nausea, vomiting, diarrhea, bright red blood per rectum, melena, or hematemesis Neurologic: negative for visual changes, syncope, or dizziness All other systems reviewed and are otherwise negative except as noted above.    Blood pressure 118/70, pulse 53, height '5\' 10"'$  (1.778 m), weight 184 lb 8 oz (83.689 kg).  General appearance: alert and no distress Neck: no adenopathy, no carotid bruit, no JVD, supple, symmetrical, trachea midline and thyroid not enlarged, symmetric, no tenderness/mass/nodules Lungs: clear to auscultation bilaterally Heart: regular rate and rhythm, S1, S2 normal, no  murmur, click, rub or gallop Extremities: extremities normal, atraumatic, no cyanosis or edema  EKG sinus bradycardia 53 without ST or T-wave changes. I personally reviewed this EKG  ASSESSMENT AND PLAN:   Acute myocardial infarction [facility]) History of CAD status post acute inferior wall myocardial infarction treated percutaneously stenting 02/18/98  With intervention of the ostium and mid dominant RCA. He did have 40% ostial left main stenosis with an EF of 40%  and inferior akinesia at that time. Follow-up echo performed 2008 revealed normal LV systolic function and his most recent Myoview performed 11/21/09 was normal as well. He denies chest pain or shortness of breath.  HYPERLIPIDEMIA History of hyperlipidemia on statin therapy followed by his PCP  Essential hypertension History of hypertension blood pressure measures 118/70. He is on metoprolol. Continue current meds at current dosing      Lorretta Harp MD [facility], [facility] 07/13/2015 2:22 PM

## 2015-07-13 NOTE — Assessment & Plan Note (Signed)
History of hypertension blood pressure measures 118/70. He is on metoprolol. Continue current meds at current dosing

## 2015-07-13 NOTE — Assessment & Plan Note (Signed)
History of hyperlipidemia on statin therapy followed by his PCP 

## 2015-07-13 NOTE — Assessment & Plan Note (Signed)
History of CAD status post acute inferior wall myocardial infarction treated percutaneously stenting 02/18/98  With intervention of the ostium and mid dominant RCA. He did have 40% ostial left main stenosis with an EF of 40% and inferior akinesia at that time. Follow-up echo performed 2008 revealed normal LV systolic function and his most recent Myoview performed 11/21/09 was normal as well. He denies chest pain or shortness of breath.

## 2015-07-13 NOTE — Patient Instructions (Signed)
Medication Instructions:  Your physician recommends that you continue on your current medications as directed. Please refer to the Current Medication list given to you today.   Labwork: I will get your lab work from your Primary Care Physician.   Testing/Procedures: none  Follow-Up: Your physician wants you to follow-up in: 12 months with Dr. Gwenlyn Found. You will receive a reminder letter in the mail two months in advance. If you don't receive a letter, please call our office to schedule the follow-up appointment.   Any Other Special Instructions Will Be Listed Below (If Applicable).     If you need a refill on your cardiac medications before your next appointment, please call your pharmacy.

## 2016-01-17 ENCOUNTER — Other Ambulatory Visit: Payer: Self-pay | Admitting: Medical Oncology

## 2016-01-17 DIAGNOSIS — C342 Malignant neoplasm of middle lobe, bronchus or lung: Secondary | ICD-10-CM

## 2016-01-18 ENCOUNTER — Other Ambulatory Visit (HOSPITAL_BASED_OUTPATIENT_CLINIC_OR_DEPARTMENT_OTHER): Payer: Medicare Other

## 2016-01-18 ENCOUNTER — Encounter (HOSPITAL_COMMUNITY): Payer: Self-pay

## 2016-01-18 ENCOUNTER — Ambulatory Visit (HOSPITAL_COMMUNITY)
Admission: RE | Admit: 2016-01-18 | Discharge: 2016-01-18 | Disposition: A | Discharge status: Home / Self Care | Payer: Medicare Other | Source: Ambulatory Visit | Attending: Internal Medicine | Admitting: Internal Medicine

## 2016-01-18 DIAGNOSIS — Z902 Acquired absence of lung [part of]: Secondary | ICD-10-CM | POA: Insufficient documentation

## 2016-01-18 DIAGNOSIS — C342 Malignant neoplasm of middle lobe, bronchus or lung: Secondary | ICD-10-CM | POA: Diagnosis present

## 2016-01-18 LAB — COMPREHENSIVE METABOLIC PANEL
ALT: 12 U/L (ref 0–55)
ANION GAP: 11 meq/L (ref 3–11)
AST: 14 U/L (ref 5–34)
Albumin: 4 g/dL (ref 3.5–5.0)
Alkaline Phosphatase: 89 U/L (ref 40–150)
BILIRUBIN TOTAL: 0.66 mg/dL (ref 0.20–1.20)
BUN: 21.1 mg/dL (ref 7.0–26.0)
CALCIUM: 9.8 mg/dL (ref 8.4–10.4)
CO2: 25 meq/L (ref 22–29)
CREATININE: 1.1 mg/dL (ref 0.7–1.3)
Chloride: 104 mEq/L (ref 98–109)
EGFR: 67 mL/min/{1.73_m2} — ABNORMAL LOW (ref >90)
Glucose: 98 mg/dl (ref 70–140)
Potassium: 4.2 mEq/L (ref 3.5–5.1)
Sodium: 139 mEq/L (ref 136–145)
TOTAL PROTEIN: 8 g/dL (ref 6.4–8.3)

## 2016-01-18 LAB — CBC WITH DIFFERENTIAL/PLATELET
BASO%: 0.5 % (ref 0.0–2.0)
Basophils Absolute: 0 10*3/uL (ref 0.0–0.1)
EOS%: 0.6 % (ref 0.0–7.0)
Eosinophils Absolute: 0.1 10*3/uL (ref 0.0–0.5)
HEMATOCRIT: 41.1 % (ref 38.4–49.9)
HGB: 13.9 g/dL (ref 13.0–17.1)
LYMPH#: 2.5 10*3/uL (ref 0.9–3.3)
LYMPH%: 29.5 % (ref 14.0–49.0)
MCH: 29.3 pg (ref 27.2–33.4)
MCHC: 33.8 g/dL (ref 32.0–36.0)
MCV: 86.5 fL (ref 79.3–98.0)
MONO#: 0.8 10*3/uL (ref 0.1–0.9)
MONO%: 9.7 % (ref 0.0–14.0)
NEUT%: 59.7 % (ref 39.0–75.0)
NEUTROS ABS: 5 10*3/uL (ref 1.5–6.5)
PLATELETS: 230 10*3/uL (ref 140–400)
RBC: 4.75 10*6/uL (ref 4.20–5.82)
RDW: 13.8 % (ref 11.0–14.6)
WBC: 8.4 10*3/uL (ref 4.0–10.3)

## 2016-01-18 MED ORDER — IOPAMIDOL (ISOVUE-300) INJECTION 61%
75.0000 mL | Freq: Once | INTRAVENOUS | Status: AC | PRN
  Administered 2016-01-18: 75 mL via INTRAVENOUS

## 2016-01-22 ENCOUNTER — Telehealth: Payer: Self-pay | Admitting: Internal Medicine

## 2016-01-22 ENCOUNTER — Ambulatory Visit (HOSPITAL_BASED_OUTPATIENT_CLINIC_OR_DEPARTMENT_OTHER): Payer: Medicare Other | Admitting: Internal Medicine

## 2016-01-22 ENCOUNTER — Encounter: Payer: Self-pay | Admitting: Internal Medicine

## 2016-01-22 VITALS — BP 137/82 | HR 55 | Temp 97.8°F | Resp 18 | Ht 70.0 in | Wt 178.7 lb

## 2016-01-22 DIAGNOSIS — Z85118 Personal history of other malignant neoplasm of bronchus and lung: Secondary | ICD-10-CM | POA: Diagnosis not present

## 2016-01-22 DIAGNOSIS — C342 Malignant neoplasm of middle lobe, bronchus or lung: Secondary | ICD-10-CM

## 2016-01-22 NOTE — Telephone Encounter (Signed)
Gave pt cal & avs °

## 2016-01-22 NOTE — Progress Notes (Signed)
[name] Telephone:(336) [phone]   Fax:(336) [phone]  OFFICE PROGRESS NOTE  Donnajean Lopes, MD [place] [address]  DIAGNOSIS: Metastatic nonsmall cell lung cancer, adenocarcinoma, diagnosed in May 2010.   PRIOR THERAPY:  1. Status post 3 cycles of neoadjuvant chemotherapy with carboplatin and Alimta; last dose was given January 10, 2009, with complete resolution of the left upper lobe mass as well as the subcarinal lymphadenopathy and partial response in the right middle lobe lung lesion. 2. Status post right middle lobectomy with lymph node dissection under the care of Dr. 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  CURRENT THERAPY: Observation.  INTERVAL HISTORY: Mathew Hall 75 y.o. male returns to the clinic today for routine annual followup visit. The patient is feeling fine today with no specific complaints. He fell recently after his left leg gave out. He is using a cane today. He denied having any significant chest pain, no shortness of breath with exertion, no cough or hemoptysis. He has no weight loss or night sweats. The patient had repeat CT scan of the chest performed recently and he is here for evaluation and discussion of his scan results.  MEDICAL HISTORY: Past Medical History  Diagnosis Date  . Hypertension   . GERD (gastroesophageal reflux disease)   . Hyperlipemia   . Peptic stricture of esophagus   . Colon polyps   . Hemorrhoids   . Asthma   . 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 CAD (coronary artery disease) 1999    stents to RCA, residual 40% Lt main, decreased EF40%   . H/O cardiovascular stress test 1999/2005/2008/2011    last with inf scar/infarct, EF 54%  . H/O echocardiogram 2008    EF 50-55%, Mild MR, Mild TR,    . Right carotid bruit 04/01/12   minimal carotid stenosis on doppler  . lung ca dx'd 10/2008    chemo comp 01/2009-stage 4 lung cancer  . Myocardial infarction ([place]) 02-18-1998  . Cataract     bilateral-beginning stages    ALLERGIES:  is allergic to lipitor and atorvastatin.  MEDICATIONS:  Current Outpatient Prescriptions  Medication Sig Dispense Refill  . aspirin 325 MG EC tablet Take 325 mg by mouth daily.      . colesevelam (WELCHOL) 625 MG tablet Take 2 tablets (1,250 mg total) by mouth 2 (two) times daily with a meal. 120 tablet 6  . esomeprazole (NEXIUM) 20 MG capsule Take 20 mg by mouth daily at 12 noon.    . metoprolol tartrate (LOPRESSOR) 25 MG tablet Take 25 mg by mouth 2 (two) times daily.    Marland Kitchen oxybutynin (DITROPAN) 5 MG tablet TAKE 1 TABLET BY MOUTH EVERY 8 HOURS AS NEEDED FOR BLADDER SPASMS  2  . simvastatin (ZOCOR) 40 MG tablet Take 1 tablet (40 mg total) by mouth every evening. 90 tablet 3  . nitroGLYCERIN (NITROSTAT) 0.4 MG SL tablet Place 1 tablet (0.4 mg total) under the tongue every 5 (five) minutes as needed for chest pain. (Patient not taking: Reported on 01/22/2016) 90 tablet 3   No current facility-administered medications for this visit.    SURGICAL HISTORY:  Past Surgical History  Procedure Laterality Date  . Angioplasty      with 2 stent placement  . Lung surgery      right  . Coronary angioplasty  with stent placement  02/18/1998    PCI/stents to ostial and mid RCA, emergently, residual Lt main disease  . Colonoscopy    . Polypectomy      HPP 09-21-08    REVIEW OF SYSTEMS:  A comprehensive review of systems was negative except for: Musculoskeletal: positive for arthralgias   PHYSICAL EXAMINATION: General appearance: alert, cooperative and no distress Head: Normocephalic, without obvious abnormality, atraumatic Neck: no adenopathy Lymph nodes: Cervical, supraclavicular, and axillary nodes normal. Resp: clear to auscultation bilaterally Cardio: regular rate and rhythm, S1, S2 normal, no  murmur, click, rub or gallop GI: soft, non-tender; bowel sounds normal; no masses,  no organomegaly Extremities: extremities normal, atraumatic, no cyanosis or edema  ECOG PERFORMANCE STATUS: 0 - Asymptomatic  Blood pressure 137/82, pulse 55, temperature 97.8 F (36.6 C), temperature source Oral, resp. rate 18, height '5\' 10"'$  (1.778 m), weight 178 lb 11.2 oz (81.058 kg), SpO2 99 %.  LABORATORY DATA: Lab Results  Component Value Date   WBC 8.4 01/18/2016   HGB 13.9 01/18/2016   HCT 41.1 01/18/2016   MCV 86.5 01/18/2016   PLT 230 01/18/2016      Chemistry      Component Value Date/Time   NA 139 01/18/2016 0836   NA 138 12/29/2011 0802   NA 138 04/30/2010 1122   K 4.2 01/18/2016 0836   K 3.9 12/29/2011 0802   K 4.5 04/30/2010 1122   CL 105 07/13/2012 0848   CL 99 12/29/2011 0802   CL 104 04/30/2010 1122   CO2 25 01/18/2016 0836   CO2 28 12/29/2011 0802   CO2 27 04/30/2010 1122   BUN 21.1 01/18/2016 0836   BUN 14 12/29/2011 0802   BUN 17 04/30/2010 1122   CREATININE 1.1 01/18/2016 0836   CREATININE 1.1 12/29/2011 0802   CREATININE 1.16 04/30/2010 1122      Component Value Date/Time   CALCIUM 9.8 01/18/2016 0836   CALCIUM 8.8 12/29/2011 0802   CALCIUM 9.5 04/30/2010 1122   ALKPHOS 89 01/18/2016 0836   ALKPHOS 87* 12/29/2011 0802   ALKPHOS 90 04/30/2010 1122   AST 14 01/18/2016 0836   AST 25 12/29/2011 0802   AST 25 04/30/2010 1122   ALT 12 01/18/2016 0836   ALT 21 12/29/2011 0802   ALT 23 04/30/2010 1122   BILITOT 0.66 01/18/2016 0836   BILITOT 1.00 12/29/2011 0802   BILITOT 0.8 04/30/2010 1122       RADIOGRAPHIC STUDIES: Ct Chest W Contrast  01/18/2016  CLINICAL DATA:  Follow-up lung cancer, status post resection, chemotherapy complete EXAM: CT CHEST WITH CONTRAST TECHNIQUE: Multidetector CT imaging of the chest was performed during intravenous contrast administration. CONTRAST:  65m ISOVUE-300 IOPAMIDOL (ISOVUE-300) INJECTION 61% COMPARISON:  01/12/2015  FINDINGS: Cardiovascular: Heart is normal in size.  No pericardial effusion. Three vessel coronary atherosclerosis. Atherosclerotic calcifications of the aortic arch. Mediastinum/Nodes: No suspicious mediastinal lymphadenopathy. Left thyroid is notable for a 15 mm left thyroid nodule. Lungs/Pleura: Status post right middle lobectomy. No suspicious pulmonary nodules. 2 mm nodule in the right upper lobe (series 6/image 37). Underlying mild to moderate emphysematous changes. Faint ground-glass opacity in the right lung base may reflect subtle infection or aspiration (series 6/ image 118). No focal consolidation. No pleural effusion or pneumothorax. Upper Abdomen: Visualized upper abdomen is within normal limits. Musculoskeletal: Degenerative changes of the visualized thoracolumbar spine. IMPRESSION: Status post right middle lobectomy. No findings to suggest recurrent or metastatic disease. Electronically Signed   By: SHenderson NewcomerD.  On: 01/18/2016 11:03   ASSESSMENT AND PLAN: This is a very pleasant 75 years old white male with history of metastatic non-small cell lung cancer status post neoadjuvant chemotherapy followed by right middle lobectomy with lymph node dissection has been observation since August of 2010 with no evidence for disease recurrence.  I discussed the scan results with the patient. I recommended for him to continue on observation with repeat CT scan of the chest in one year. He was advised to call immediately if he has any concerning symptoms in the interval.  All questions were answered. The patient knows to call the clinic with any problems, questions or concerns. We can certainly see the patient much sooner if necessary.  Disclaimer: This note was dictated with voice recognition software. Similar sounding words can inadvertently be transcribed and may be missed upon review.

## 2016-08-02 ENCOUNTER — Other Ambulatory Visit: Payer: Self-pay | Admitting: Cardiovascular Disease

## 2016-08-13 ENCOUNTER — Ambulatory Visit (INDEPENDENT_AMBULATORY_CARE_PROVIDER_SITE_OTHER): Payer: Medicare Other | Admitting: Cardiovascular Disease

## 2016-08-13 ENCOUNTER — Encounter: Payer: Self-pay | Admitting: Cardiovascular Disease

## 2016-08-13 VITALS — BP 154/66 | HR 50 | Ht 70.0 in | Wt 175.4 lb

## 2016-08-13 DIAGNOSIS — I2510 Atherosclerotic heart disease of native coronary artery without angina pectoris: Secondary | ICD-10-CM | POA: Diagnosis not present

## 2016-08-13 DIAGNOSIS — I10 Essential (primary) hypertension: Secondary | ICD-10-CM | POA: Diagnosis not present

## 2016-08-13 NOTE — Assessment & Plan Note (Signed)
History of hypertension blood pressure measures 154/66. He is on metoprolol . Continue current meds at current dosing

## 2016-08-13 NOTE — Assessment & Plan Note (Signed)
History of hyperlipidemia on statin therapy followed by his PCP 

## 2016-08-13 NOTE — Patient Instructions (Signed)

## 2016-08-13 NOTE — Progress Notes (Signed)
08/13/2016 Mathew Hall   [birth year]  [id number]  Primary Physician Mathew Lopes, MD Primary Cardiologist: Mathew Harp MD Mathew Hall  HPI:  The patient is a 76 year old mildly overweight married Caucasian male father of 2, grandfather of 6 grandchildren who I saw 07/13/15. He has a history of CAD status post acute inferior wall myocardial infarction treated percutaneously with stenting February 18, 1998 with intervention of his ostium and mid dominant RCA. He did have a 40% ostial left main stenosis and an EF of 40%, inferior akinesis at that time. He had a normal abdominal aorta, renal arteries and iliac arteries. Followup echo performed in 2008 showed normal LV systolic function, and his most recent Myoview performed Nov 21, 2009 was normal as well. He denies chest pain or shortness of breath. His other problems include hypertension, hyperlipidemia and GERD. Dr. Philip Hall follows his lipid profile.   Current Outpatient Prescriptions  Medication Sig Dispense Refill  . aspirin 325 MG EC tablet Take 325 mg by mouth daily.      . colesevelam (WELCHOL) 625 MG tablet Take 2 tablets (1,250 mg total) by mouth 2 (two) times daily with a meal. 120 tablet 6  . esomeprazole (NEXIUM) 20 MG capsule Take 20 mg by mouth daily at 12 noon.    . metoprolol tartrate (LOPRESSOR) 25 MG tablet Take 25 mg by mouth 2 (two) times daily.    . nitroGLYCERIN (NITROSTAT) 0.4 MG SL tablet Place 1 tablet (0.4 mg total) under the tongue every 5 (five) minutes as needed for chest pain. 90 tablet 3  . oxybutynin (DITROPAN) 5 MG tablet TAKE 1 TABLET BY MOUTH EVERY 8 HOURS AS NEEDED FOR BLADDER SPASMS  2  . simvastatin (ZOCOR) 40 MG tablet TAKE 1 TABLET (40 MG TOTAL) BY MOUTH EVERY EVENING. 90 tablet 3   No current facility-administered medications for this visit.     Allergies  Allergen Reactions  . Lipitor [Atorvastatin Calcium]     Knee pain   . Atorvastatin Other (See Comments)    Joint/knee  pain    Social History   Social History  . Marital status: Married    Spouse name: N/A  . Number of children: 2  . Years of education: N/A   Occupational History  . retired    Social History Main Topics  . Smoking status: Former Smoker    Quit date: 07/16/1997  . Smokeless tobacco: Never Used  . Alcohol use Yes     Comment: occasionally  . Drug use: No  . Sexual activity: Not on file   Other Topics Concern  . Not on file   Social History Narrative  . No narrative on file     Review of Systems: General: negative for chills, fever, night sweats or weight changes.  Cardiovascular: negative for chest pain, dyspnea on exertion, edema, orthopnea, palpitations, paroxysmal nocturnal dyspnea or shortness of breath Dermatological: negative for rash Respiratory: negative for cough or wheezing Urologic: negative for hematuria Abdominal: negative for nausea, vomiting, diarrhea, bright red blood per rectum, melena, or hematemesis Neurologic: negative for visual changes, syncope, or dizziness All other systems reviewed and are otherwise negative except as noted above.    Blood pressure (!) 154/66, pulse (!) 50, height '5\' 10"'$  (1.778 m), weight 175 lb 6.4 oz (79.6 kg).  General appearance: alert and no distress Neck: no adenopathy, no carotid bruit, no JVD, supple, symmetrical, trachea midline and thyroid not enlarged, symmetric, no tenderness/mass/nodules Lungs: clear to  auscultation bilaterally Heart: regular rate and rhythm, S1, S2 normal, no murmur, click, rub or gallop Extremities: extremities normal, atraumatic, no cyanosis or edema  EKG sinus bradycardia at 55 bundle-branch block. I personally reviewed this EKG.  ASSESSMENT AND PLAN:   HYPERLIPIDEMIA History of hyperlipidemia on statin therapy followed by his PCP  Essential hypertension History of hypertension blood pressure measures 154/66. He is on metoprolol . Continue current meds at current dosing  CAD (coronary  artery disease), emergent stent to ostial RCA and mid RCA with an acute inferior MI in 1999, residual left main disease 40% at that time, last week's study 2011 with inferior scar no isc History of CAD status post inferior wall myocardial infarction treated percutaneously and stenting 02/18/98 with the dimensions of the ostium of this dominant RCA as well as mid RCA. He did have 40% ostial left main with an EF of 40%. Akinesia at that time. Follow up echo performed 2008 showed normal LV systolic function and mildly performed 11/21/09 was normal. He denies chest pain or shortness of breath.      Mathew Harp MD FACP,FACC,FAHA, [facility] 08/13/2016 10:19 AM

## 2016-08-13 NOTE — Assessment & Plan Note (Signed)
History of CAD status post inferior wall myocardial infarction treated percutaneously and stenting 02/18/98 with the dimensions of the ostium of this dominant RCA as well as mid RCA. He did have 40% ostial left main with an EF of 40%. Akinesia at that time. Follow up echo performed 2008 showed normal LV systolic function and mildly performed 11/21/09 was normal. He denies chest pain or shortness of breath.

## 2016-08-15 DIAGNOSIS — E785 Hyperlipidemia, unspecified: Secondary | ICD-10-CM

## 2016-08-29 NOTE — Addendum Note (Signed)
Addended by: Waylan Rocher on: 08/29/2016 10:35 AM   Modules accepted: Orders

## 2017-01-08 ENCOUNTER — Telehealth: Payer: Self-pay | Admitting: Internal Medicine

## 2017-01-08 NOTE — Telephone Encounter (Signed)
Confirmed appointment change with patient

## 2017-01-16 ENCOUNTER — Other Ambulatory Visit (HOSPITAL_BASED_OUTPATIENT_CLINIC_OR_DEPARTMENT_OTHER): Payer: Medicare Other

## 2017-01-16 ENCOUNTER — Ambulatory Visit (HOSPITAL_COMMUNITY)
Admission: RE | Admit: 2017-01-16 | Discharge: 2017-01-16 | Disposition: A | Discharge status: Home / Self Care | Payer: Medicare Other | Source: Ambulatory Visit | Attending: Internal Medicine | Admitting: Internal Medicine

## 2017-01-16 DIAGNOSIS — I700 Atherosclerosis of aorta: Secondary | ICD-10-CM | POA: Insufficient documentation

## 2017-01-16 DIAGNOSIS — E041 Nontoxic single thyroid nodule: Secondary | ICD-10-CM | POA: Diagnosis not present

## 2017-01-16 DIAGNOSIS — Z85118 Personal history of other malignant neoplasm of bronchus and lung: Secondary | ICD-10-CM | POA: Diagnosis not present

## 2017-01-16 DIAGNOSIS — C342 Malignant neoplasm of middle lobe, bronchus or lung: Secondary | ICD-10-CM | POA: Diagnosis present

## 2017-01-16 DIAGNOSIS — J439 Emphysema, unspecified: Secondary | ICD-10-CM | POA: Insufficient documentation

## 2017-01-16 DIAGNOSIS — Z902 Acquired absence of lung [part of]: Secondary | ICD-10-CM | POA: Diagnosis not present

## 2017-01-16 DIAGNOSIS — K8020 Calculus of gallbladder without cholecystitis without obstruction: Secondary | ICD-10-CM | POA: Diagnosis not present

## 2017-01-16 LAB — CBC WITH DIFFERENTIAL/PLATELET
BASO%: 0.7 % (ref 0.0–2.0)
BASOS ABS: 0 10*3/uL (ref 0.0–0.1)
EOS ABS: 0.1 10*3/uL (ref 0.0–0.5)
EOS%: 1.7 % (ref 0.0–7.0)
HEMATOCRIT: 42.7 % (ref 38.4–49.9)
HGB: 14.1 g/dL (ref 13.0–17.1)
LYMPH#: 1.5 10*3/uL (ref 0.9–3.3)
LYMPH%: 25.3 % (ref 14.0–49.0)
MCH: 29 pg (ref 27.2–33.4)
MCHC: 33 g/dL (ref 32.0–36.0)
MCV: 87.9 fL (ref 79.3–98.0)
MONO#: 0.7 10*3/uL (ref 0.1–0.9)
MONO%: 11.7 % (ref 0.0–14.0)
NEUT#: 3.7 10*3/uL (ref 1.5–6.5)
NEUT%: 60.6 % (ref 39.0–75.0)
PLATELETS: 168 10*3/uL (ref 140–400)
RBC: 4.86 10*6/uL (ref 4.20–5.82)
RDW: 13.7 % (ref 11.0–14.6)
WBC: 6.1 10*3/uL (ref 4.0–10.3)

## 2017-01-16 LAB — COMPREHENSIVE METABOLIC PANEL
ALT: 11 U/L (ref 0–55)
AST: 15 U/L (ref 5–34)
Albumin: 4 g/dL (ref 3.5–5.0)
Alkaline Phosphatase: 95 U/L (ref 40–150)
Anion Gap: 10 mEq/L (ref 3–11)
BILIRUBIN TOTAL: 0.75 mg/dL (ref 0.20–1.20)
BUN: 16.6 mg/dL (ref 7.0–26.0)
CALCIUM: 9.9 mg/dL (ref 8.4–10.4)
CHLORIDE: 105 meq/L (ref 98–109)
CO2: 27 meq/L (ref 22–29)
CREATININE: 1.1 mg/dL (ref 0.7–1.3)
EGFR: 65 mL/min/{1.73_m2} — AB (ref >90)
Glucose: 110 mg/dl (ref 70–140)
Potassium: 4.2 mEq/L (ref 3.5–5.1)
Sodium: 141 mEq/L (ref 136–145)
Total Protein: 7.9 g/dL (ref 6.4–8.3)

## 2017-01-20 ENCOUNTER — Ambulatory Visit: Payer: Medicare Other | Admitting: Internal Medicine

## 2017-01-27 ENCOUNTER — Telehealth: Payer: Self-pay | Admitting: Internal Medicine

## 2017-01-27 ENCOUNTER — Encounter: Payer: Self-pay | Admitting: Internal Medicine

## 2017-01-27 ENCOUNTER — Ambulatory Visit (HOSPITAL_BASED_OUTPATIENT_CLINIC_OR_DEPARTMENT_OTHER): Payer: Medicare Other | Admitting: Internal Medicine

## 2017-01-27 VITALS — BP 136/70 | HR 49 | Temp 97.8°F | Resp 18 | Ht 70.0 in | Wt 174.8 lb

## 2017-01-27 DIAGNOSIS — I10 Essential (primary) hypertension: Secondary | ICD-10-CM

## 2017-01-27 DIAGNOSIS — C342 Malignant neoplasm of middle lobe, bronchus or lung: Secondary | ICD-10-CM

## 2017-01-27 DIAGNOSIS — Z85118 Personal history of other malignant neoplasm of bronchus and lung: Secondary | ICD-10-CM | POA: Diagnosis not present

## 2017-01-27 NOTE — Progress Notes (Signed)
[facility] [phone]   [phone]  OFFICE PROGRESS NOTE  [name], MD [place] [address]  DIAGNOSIS: Metastatic nonsmall cell lung cancer, adenocarcinoma, diagnosed in May 2010.   PRIOR THERAPY:  1. Status post 3 cycles of neoadjuvant chemotherapy with carboplatin and Alimta; last dose was given January 10, 2009, with complete resolution of the left upper lobe mass as well as the subcarinal lymphadenopathy and partial response in the right middle lobe lung lesion. 2. Status post right middle lobectomy with lymph node dissection under the care of Dr. 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  CURRENT THERAPY: Observation.  INTERVAL HISTORY: Mathew Hall 76 y.o. male returns to the clinic today for annual follow-up visit. The patient has no complaints today. He has been observation for the last 8 years and feeling well. He denied having any chest pain, shortness of breath, cough or hemoptysis. He denied having any fever or chills. He has no nausea, vomiting, diarrhea or constipation. He denied having any significant weight loss or night sweats. He had repeat CT scan of the chest performed recently and he is here for evaluation and discussion of his scan results.   MEDICAL HISTORY: Past Medical History:  Diagnosis Date  . Asthma   . CAD (coronary artery disease) 1999   stents to RCA, residual 40% Lt main, decreased EF40%   . Cataract    bilateral-beginning stages  . Colon polyps   . GERD (gastroesophageal reflux disease)   . H/O cardiovascular stress test 1999/2005/2008/2011   last with inf scar/infarct, EF 54%  . H/O echocardiogram 2008   EF 50-55%, Mild MR, Mild TR,    . Hemorrhoids   . 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 Hyperlipemia   .  Hypertension   . lung ca dx'd 10/2008   chemo comp 01/2009-stage 4 lung cancer  . Myocardial infarction 02-18-1998  . Peptic stricture of esophagus   . Right carotid bruit 04/01/12   minimal carotid stenosis on doppler    ALLERGIES:  is allergic to lipitor [atorvastatin calcium] and atorvastatin.  MEDICATIONS:  Current Outpatient Prescriptions  Medication Sig Dispense Refill  . aspirin 325 MG EC tablet Take 325 mg by mouth daily.      . colesevelam (WELCHOL) 625 MG tablet Take 2 tablets (1,250 mg total) by mouth 2 (two) times daily with a meal. 120 tablet 6  . esomeprazole (NEXIUM) 20 MG capsule Take 20 mg by mouth daily at 12 noon.    . metoprolol tartrate (LOPRESSOR) 25 MG tablet Take 25 mg by mouth 2 (two) times daily.    . nitroGLYCERIN (NITROSTAT) 0.4 MG SL tablet Place 1 tablet (0.4 mg total) under the tongue every 5 (five) minutes as needed for chest pain. 90 tablet 3  . oxybutynin (DITROPAN) 5 MG tablet TAKE 1 TABLET BY MOUTH EVERY 8 HOURS AS NEEDED FOR BLADDER SPASMS  2  . simvastatin (ZOCOR) 40 MG tablet TAKE 1 TABLET (40 MG TOTAL) BY MOUTH EVERY EVENING. 90 tablet 3   No current facility-administered medications for this visit.     SURGICAL HISTORY:  Past Surgical History:  Procedure Laterality Date  . ANGIOPLASTY     with 2 stent placement  . COLONOSCOPY    . CORONARY ANGIOPLASTY WITH STENT PLACEMENT  02/18/1998   PCI/stents to ostial  and mid RCA, emergently, residual Lt main disease  . LUNG SURGERY     right  . POLYPECTOMY     HPP 09-21-08    REVIEW OF SYSTEMS:  A comprehensive review of systems was negative.   PHYSICAL EXAMINATION: General appearance: alert, cooperative and no distress Head: Normocephalic, without obvious abnormality, atraumatic Neck: no adenopathy Lymph nodes: Cervical, supraclavicular, and axillary nodes normal. Resp: clear to auscultation bilaterally Back: symmetric, no curvature. ROM normal. No CVA tenderness. Cardio: regular rate and  rhythm, S1, S2 normal, no murmur, click, rub or gallop GI: soft, non-tender; bowel sounds normal; no masses,  no organomegaly Extremities: extremities normal, atraumatic, no cyanosis or edema  ECOG PERFORMANCE STATUS: 0 - Asymptomatic  Blood pressure 136/70, pulse (!) 49, temperature 97.8 F (36.6 C), temperature source Oral, resp. rate 18, height 5\' 10"  (1.778 m), weight 174 lb 12.8 oz (79.3 kg), SpO2 100 %.  LABORATORY DATA: Lab Results  Component Value Date   WBC 6.1 01/16/2017   HGB 14.1 01/16/2017   HCT 42.7 01/16/2017   MCV 87.9 01/16/2017   PLT 168 01/16/2017      Chemistry      Component Value Date/Time   NA 141 01/16/2017 0753   K 4.2 01/16/2017 0753   CL 105 07/13/2012 0848   CO2 27 01/16/2017 0753   BUN 16.6 01/16/2017 0753   CREATININE 1.1 01/16/2017 0753      Component Value Date/Time   CALCIUM 9.9 01/16/2017 0753   ALKPHOS 95 01/16/2017 0753   AST 15 01/16/2017 0753   ALT 11 01/16/2017 0753   BILITOT 0.75 01/16/2017 0753       RADIOGRAPHIC STUDIES: Ct Chest Wo Contrast  Result Date: 01/16/2017 CLINICAL DATA:  Lung cancer restaging. History of primary malignancy right middle lobe. EXAM: CT CHEST WITHOUT CONTRAST TECHNIQUE: Multidetector CT imaging of the chest was performed following the standard protocol without IV contrast. COMPARISON:  01/18/2016 FINDINGS: Cardiovascular: The heart size is normal. No pericardial effusion. Coronary artery calcification is noted. Atherosclerotic calcification is noted in the wall of the thoracic aorta. Mediastinum/Nodes: 14 mm left thyroid nodule is stable. No mediastinal lymphadenopathy. No evidence for gross hilar lymphadenopathy although assessment is limited by the lack of intravenous contrast on today's study. The esophagus has normal imaging features. There is no axillary lymphadenopathy. Lungs/Pleura: Centrilobular and paraseptal emphysema again noted. Surgical changes right mid lung are similar to prior. Tiny peripheral  nodule in the right upper lung (image 40 series 4) is unchanged. Areas of bronchial wall thickening noted in the right lower lobe. No focal airspace consolidation. No pulmonary edema or pleural effusion. Upper Abdomen: 5 mm calcified gallstone. Upper abdomen otherwise unremarkable. Musculoskeletal: Bone windows reveal no worrisome lytic or sclerotic osseous lesions. IMPRESSION: 1. Stable exam. Postsurgical changes right lung compatible with reported clinical history of right middle lobectomy. No new or progressive findings on today's study to raise concern for recurrent or metastatic disease. 2.  Emphysema. (ICD10-J43.9) 3.  Aortic Atherosclerois (ICD10-170.0) 4. Stable small left thyroid nodule. 5. Cholelithiasis. Electronically Signed   By: Misty Stanley M.D.   On: 01/16/2017 10:22   ASSESSMENT AND PLAN: This is a very pleasant 76 years old white male with history of metastatic non-small cell lung cancer status post neoadjuvant chemotherapy followed by right middle lobectomy with lymph node dissection has been observation since August of 2010 with no evidence for disease recurrence.  He has a recent CT scan of the chest that showed no evidence for disease  recurrence. I discussed the scan results with the patient today and recommended for him to continue on observation with repeat CT scan of the chest in one year. The patient was advised to call immediately if he has any concerning symptoms in the interval. All questions were answered. The patient knows to call the clinic with any problems, questions or concerns. We can certainly see the patient much sooner if necessary.  Disclaimer: This note was dictated with voice recognition software. Similar sounding words can inadvertently be transcribed and may be missed upon review.

## 2017-01-27 NOTE — Telephone Encounter (Signed)
Scheduled appt per 7/24 los - Gave patient AVS and calender per [facility]iology to contact patient with ct

## 2017-07-27 ENCOUNTER — Other Ambulatory Visit: Payer: Self-pay

## 2017-07-27 MED ORDER — SIMVASTATIN 40 MG PO TABS: 40.0000 mg | ORAL_TABLET | Freq: Every evening | ORAL | 3 refills | Status: DC

## 2017-09-01 ENCOUNTER — Ambulatory Visit: Payer: Medicare Other | Admitting: Cardiovascular Disease

## 2017-09-01 ENCOUNTER — Encounter: Payer: Self-pay | Admitting: Cardiovascular Disease

## 2017-09-01 VITALS — BP 154/76 | HR 50 | Ht 70.0 in | Wt 176.0 lb

## 2017-09-01 DIAGNOSIS — I2101 ST elevation (STEMI) myocardial infarction involving left main coronary artery: Secondary | ICD-10-CM | POA: Diagnosis not present

## 2017-09-01 DIAGNOSIS — I10 Essential (primary) hypertension: Secondary | ICD-10-CM

## 2017-09-01 MED ORDER — NITROGLYCERIN 0.4 MG SL SUBL: 0.4000 mg | SUBLINGUAL_TABLET | SUBLINGUAL | 3 refills | Status: DC | PRN

## 2017-09-01 NOTE — Assessment & Plan Note (Signed)
History of essential hypertension blood pressure measured 154/76. He is on amlodipine, metoprolol. Continue current meds. Doesn't

## 2017-09-01 NOTE — Assessment & Plan Note (Signed)
History of CAD status post acute inferior wall myocardial infarction treated with PCI stenting 02/18/98 with intervention of the ostium and midportion of his dominant RCA. He did have a 40% ostial left main stenosis with EF of 40% and inferior akinesia at that time. Follow-up echo performed in 2008 showed normal LV systolic function and Myoview performed 11/21/09 was normal as well. He denies chest pain or shortness of breath.

## 2017-09-01 NOTE — Progress Notes (Signed)
09/01/2017 Mathew Hall   24-Jan-1941  [id number]  Primary Physician [name], MD Primary Cardiologist: Lorretta Harp MD FACP, [place], [place], [place]  HPI:  Mathew Hall is a 77 y.o.  mildly overweight married Caucasian male father of 2, grandfather of 6 grandchildren who I saw  08/13/16. He has a history of CAD status post acute inferior wall myocardial infarction treated percutaneously with stenting February 18, 1998 with intervention of his ostium and mid dominant RCA. He did have a 40% ostial left main stenosis and an EF of 40%, inferior akinesis at that time. He had a normal abdominal aorta, renal arteries and iliac arteries. Followup echo performed in 2008 showed normal LV systolic function, and his most recent Myoview performed Nov 21, 2009 was normal as well. He denies chest pain or shortness of breath. His other problems include hypertension, hyperlipidemia and GERD. Dr. Philip Aspen follows his lipid profile which most recently performed 08/21/17 revealed a total cholesterol 128, LDL 83 and HDL of 63.     Current Meds  Medication Sig  . amLODipine (NORVASC) 2.5 MG tablet Take 2.5 mg by mouth daily.  Marland Kitchen aspirin 325 MG EC tablet Take 325 mg by mouth daily.    . colesevelam (WELCHOL) 625 MG tablet Take 2 tablets (1,250 mg total) by mouth 2 (two) times daily with a meal.  . esomeprazole (NEXIUM) 20 MG capsule Take 20 mg by mouth daily at 12 noon.  . metoprolol tartrate (LOPRESSOR) 25 MG tablet Take 25 mg by mouth 2 (two) times daily.  . nitroGLYCERIN (NITROSTAT) 0.4 MG SL tablet Place 1 tablet (0.4 mg total) under the tongue every 5 (five) minutes as needed for chest pain.  Marland Kitchen oxybutynin (DITROPAN) 5 MG tablet TAKE 1 TABLET BY MOUTH EVERY 8 HOURS AS NEEDED FOR BLADDER SPASMS  . simvastatin (ZOCOR) 40 MG tablet Take 1 tablet (40 mg total) by mouth every evening.  . Vitamin D, Ergocalciferol, (DRISDOL) 50000 units CAPS capsule Take 1 capsule by mouth once a week.  . [DISCONTINUED]  nitroGLYCERIN (NITROSTAT) 0.4 MG SL tablet Place 1 tablet (0.4 mg total) under the tongue every 5 (five) minutes as needed for chest pain.     Allergies  Allergen Reactions  . Lipitor [Atorvastatin Calcium]     Knee pain   . Atorvastatin Other (See Comments)    Joint/knee pain    Social History   Socioeconomic History  . Marital status: Married    Spouse name: Not on file  . Number of children: 2  . Years of education: Not on file  . Highest education level: Not on file  Social Needs  . Financial resource strain: Not on file  . Food insecurity - worry: Not on file  . Food insecurity - inability: Not on file  . Transportation needs - medical: Not on file  . Transportation needs - non-medical: Not on file  Occupational History  . Occupation: retired  Tobacco Use  . Smoking status: Former Smoker    Last attempt to quit: 07/16/1997    Years since quitting: 20.1  . Smokeless tobacco: Never Used  Substance and Sexual Activity  . Alcohol use: Yes    Comment: occasionally  . Drug use: No  . Sexual activity: Not on file  Other Topics Concern  . Not on file  Social History Narrative  . Not on file     Review of Systems: General: negative for chills, fever, night sweats or weight changes.  Cardiovascular:  negative for chest pain, dyspnea on exertion, edema, orthopnea, palpitations, paroxysmal nocturnal dyspnea or shortness of breath Dermatological: negative for rash Respiratory: negative for cough or wheezing Urologic: negative for hematuria Abdominal: negative for nausea, vomiting, diarrhea, bright red blood per rectum, melena, or hematemesis Neurologic: negative for visual changes, syncope, or dizziness All other systems reviewed and are otherwise negative except as noted above.    Blood pressure (!) 154/76, pulse (!) 50, height 5\' 10"  (1.778 m), weight 176 lb (79.8 kg).  General appearance: alert and no distress Neck: no adenopathy, no carotid bruit, no JVD, supple,  symmetrical, trachea midline and thyroid not enlarged, symmetric, no tenderness/mass/nodules Lungs: clear to auscultation bilaterally Heart: regular rate and rhythm, S1, S2 normal, no murmur, click, rub or gallop Extremities: extremities normal, atraumatic, no cyanosis or edema Pulses: 2+ and symmetric Skin: Skin color, texture, turgor normal. No rashes or lesions Neurologic: Alert and oriented X 3, normal strength and tone. Normal symmetric reflexes. Normal coordination and gait  EKG sinus bradycardia 50 with right bundle branch block. He did have inferior Q waves as well. I personally reviewed this EKG.  ASSESSMENT AND PLAN:   HYPERLIPIDEMIA History of hyperlipidemia on statin therapy with recent lipid profile performed by his PCP 08/21/17 revealed a total cholesterol 128, LDL 83 and HDL of 63.  Essential hypertension History of essential hypertension blood pressure measured 154/76. He is on amlodipine, metoprolol. Continue current meds. Doesn't  Acute myocardial infarction History of CAD status post acute inferior wall myocardial infarction treated with PCI stenting 02/18/98 with intervention of the ostium and midportion of his dominant RCA. He did have a 40% ostial left main stenosis with EF of 40% and inferior akinesia at that time. Follow-up echo performed in 2008 showed normal LV systolic function and Myoview performed 11/21/09 was normal as well. He denies chest pain or shortness of breath.      Lorretta Harp MD FACP,FACC,FAHA, [facility] 09/01/2017 10:44 AM

## 2017-09-01 NOTE — Assessment & Plan Note (Signed)
History of hyperlipidemia on statin therapy with recent lipid profile performed by his PCP 08/21/17 revealed a total cholesterol 128, LDL 83 and HDL of 63.

## 2017-09-01 NOTE — Patient Instructions (Signed)

## 2017-11-11 ENCOUNTER — Telehealth: Payer: Self-pay | Admitting: Internal Medicine

## 2017-11-11 NOTE — Telephone Encounter (Signed)
Pt states he can drink something and it feels like it is getting stuck and then he throws up. Discussed with pt that he might try some mylanta or maalox to see if that helped. Pt wanted to know if he could try ice cream, let him know that was fine. Discussed with him that he should try to drink liquids to keep from becoming dehydrated. Pt verbalized understanding.

## 2017-11-13 ENCOUNTER — Encounter: Payer: Self-pay | Admitting: Internal Medicine

## 2017-11-13 ENCOUNTER — Ambulatory Visit: Payer: Medicare Other | Admitting: Internal Medicine

## 2017-11-13 ENCOUNTER — Encounter (INDEPENDENT_AMBULATORY_CARE_PROVIDER_SITE_OTHER): Payer: Self-pay

## 2017-11-13 VITALS — BP 130/82 | HR 52 | Ht 70.0 in | Wt 173.2 lb

## 2017-11-13 DIAGNOSIS — Z86010 Personal history of colonic polyps: Secondary | ICD-10-CM

## 2017-11-13 DIAGNOSIS — K219 Gastro-esophageal reflux disease without esophagitis: Secondary | ICD-10-CM | POA: Diagnosis not present

## 2017-11-13 DIAGNOSIS — R1310 Dysphagia, unspecified: Secondary | ICD-10-CM | POA: Diagnosis not present

## 2017-11-13 MED ORDER — NA SULFATE-K SULFATE-MG SULF 17.5-3.13-1.6 GM/177ML PO SOLN: 1.0000 | Freq: Once | ORAL | 0 refills | Status: AC

## 2017-11-13 NOTE — Patient Instructions (Signed)

## 2017-11-13 NOTE — Progress Notes (Signed)
HISTORY OF PRESENT ILLNESS:  Mathew Hall is a 77 y.o. male with past medical history as listed below who presents today with new complaints of significant solid food dysphagia with transient food impaction. He has a history of GERD for which she has been maintained on Nexium over-the-counter. He tells me that he ran out of his Nexium for approximately 5 days and then experienced acute food impaction with steak which lasted about one day. He has since resumed Nexium. He is accompanied today by [name]. Patient also has a history of adenomatous colon polyps. His last examination was performed November 2015 at which time he was found to have 4 polyps 3 of which were adenomatous. Follow-up in 3 years recommended. He has not scheduled that examination. GI review of systems is otherwise negative.  REVIEW OF SYSTEMS:  All non-GI ROS negative unless otherwise stated in the history of present illness  Past Medical History:  Diagnosis Date  . Asthma   . CAD (coronary artery disease) 1999   stents to RCA, residual 40% Lt main, decreased EF40%   . Cataract    bilateral-beginning stages  . Colon polyps   . GERD (gastroesophageal reflux disease)   . H/O cardiovascular stress test 1999/2005/2008/2011   last with inf scar/infarct, EF 54%  . H/O echocardiogram 2008   EF 50-55%, Mild MR, Mild TR,    . Hemorrhoids   . 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 Hyperlipemia   . Hypertension   . lung ca dx'd 10/2008   chemo comp 01/2009-stage 4 lung cancer  . Myocardial infarction ([place]) 02-18-1998  . Peptic stricture of esophagus   . Right carotid bruit 04/01/12   minimal carotid stenosis on doppler    Past Surgical History:  Procedure Laterality Date  . ANGIOPLASTY     with 2 stent placement  . COLONOSCOPY    . CORONARY ANGIOPLASTY WITH STENT PLACEMENT  02/18/1998   PCI/stents to ostial and mid RCA, emergently, residual Lt main disease  . LUNG SURGERY     right  .  POLYPECTOMY     HPP 09-21-08    Social History [name]  reports that he quit smoking about 20 years ago. He has never used smokeless tobacco. He reports that he drinks alcohol. He reports that he does not use drugs.  family history includes Healthy in his sister and sister; Stroke in his mother.  Allergies  Allergen Reactions  . Lipitor [Atorvastatin Calcium]     Knee pain   . Atorvastatin Other (See Comments)    Joint/knee pain       PHYSICAL EXAMINATION: Vital signs: BP 130/82   Pulse (!) 52   Ht 5\' 10"  (1.778 m)   Wt 173 lb 3.2 oz (78.6 kg)   SpO2 98%   BMI 24.85 kg/m   Constitutional: generally well-appearing, no acute distress Psychiatric: alert and oriented x3, cooperative Eyes: extraocular movements intact, anicteric, conjunctiva pink Mouth: oral pharynx moist, no lesions Neck: supple no lymphadenopathy Cardiovascular: heart regular rate and rhythm, no murmur Lungs: clear to auscultation bilaterally Abdomen: soft, nontender, nondistended, no obvious ascites, no peritoneal signs, normal bowel sounds, no organomegaly Rectal:deferred until colonoscopy Extremities: no clubbing, cyanosis, or lower extremity edema bilaterally Skin: no lesions on visible extremities Neuro: No focal deficits. Cranial nerves intact  ASSESSMENT:  #1. Solid food dysphagia with transient food impaction likely secondary to peptic stricture. #2. Chronic GERD. Requiring PPI daily for symptom relief #3. History  of multiple adenomatous colon polyps. Overdue for follow-up   PLAN:  #1. Reflux precautions #2. Continue Nexium OTC 20 mg daily #3. Schedule upper endoscopy with esophageal dilation.The nature of the procedure, as well as the risks, benefits, and alternatives were carefully and thoroughly reviewed with the patient. Ample time for discussion and questions allowed. The patient understood, was satisfied, and agreed to proceed. #4. Schedule surveillance colonoscopy with  polypectomy if necessary.The nature of the procedure, as well as the risks, benefits, and alternatives were carefully and thoroughly reviewed with the patient. Ample time for discussion and questions allowed. The patient understood, was satisfied, and agreed to proceed.

## 2017-12-23 ENCOUNTER — Encounter: Payer: Self-pay | Admitting: Internal Medicine

## 2018-01-06 ENCOUNTER — Encounter: Payer: Self-pay | Admitting: Internal Medicine

## 2018-01-06 ENCOUNTER — Ambulatory Visit (AMBULATORY_SURGERY_CENTER): Payer: Medicare Other | Admitting: Internal Medicine

## 2018-01-06 ENCOUNTER — Other Ambulatory Visit: Payer: Self-pay

## 2018-01-06 VITALS — BP 125/54 | HR 46 | Temp 97.1°F | Resp 24 | Ht 70.0 in | Wt 173.0 lb

## 2018-01-06 DIAGNOSIS — Z860100 Personal history of colon polyps, unspecified: Secondary | ICD-10-CM

## 2018-01-06 DIAGNOSIS — K222 Esophageal obstruction: Secondary | ICD-10-CM

## 2018-01-06 DIAGNOSIS — D122 Benign neoplasm of ascending colon: Secondary | ICD-10-CM | POA: Diagnosis not present

## 2018-01-06 DIAGNOSIS — R1319 Other dysphagia: Secondary | ICD-10-CM

## 2018-01-06 DIAGNOSIS — D123 Benign neoplasm of transverse colon: Secondary | ICD-10-CM

## 2018-01-06 DIAGNOSIS — R1310 Dysphagia, unspecified: Secondary | ICD-10-CM | POA: Diagnosis not present

## 2018-01-06 DIAGNOSIS — K219 Gastro-esophageal reflux disease without esophagitis: Secondary | ICD-10-CM | POA: Diagnosis not present

## 2018-01-06 MED ORDER — SODIUM CHLORIDE 0.9 % IV SOLN: 500.0000 mL | Freq: Once | INTRAVENOUS | Status: DC

## 2018-01-06 NOTE — Patient Instructions (Addendum)
*polyp and post dlitation diet handout given*  YOU HAD AN ENDOSCOPIC PROCEDURE TODAY AT [facility]:   Refer to the procedure report that was given to you for any specific questions about what was found during the examination.  If the procedure report does not answer your questions, please call your gastroenterologist to clarify.  If you requested that your care partner not be given the details of your procedure findings, then the procedure report has been included in a sealed envelope for you to review at your convenience later.  YOU SHOULD EXPECT: Some feelings of bloating in the abdomen. Passage of more gas than usual.  Walking can help get rid of the air that was put into your GI tract during the procedure and reduce the bloating. If you had a lower endoscopy (such as a colonoscopy or flexible sigmoidoscopy) you may notice spotting of blood in your stool or on the toilet paper. If you underwent a bowel prep for your procedure, you may not have a normal bowel movement for a few days.  Please Note:  You might notice some irritation and congestion in your nose or some drainage.  This is from the oxygen used during your procedure.  There is no need for concern and it should clear up in a day or so.  SYMPTOMS TO REPORT IMMEDIATELY:   Following lower endoscopy (colonoscopy or flexible sigmoidoscopy):  Excessive amounts of blood in the stool  Significant tenderness or worsening of abdominal pains  Swelling of the abdomen that is new, acute  Fever of 100F or higher   Following upper endoscopy (EGD)  Vomiting of blood or coffee ground material  New chest pain or pain under the shoulder blades  Painful or persistently difficult swallowing  New shortness of breath  Fever of 100F or higher  Black, tarry-looking stools  For urgent or emergent issues, a gastroenterologist can be reached at any hour by calling [phone].   DIET:  Nothing to eat or drink until 3:20, clear  liquids for the next hour, soft foods for the rest of the day.  Tommorrow  you may proceed to your regular diet.  Drink plenty of fluids but you should avoid alcoholic beverages for 24 hours.  ACTIVITY:  You should plan to take it easy for the rest of today and you should NOT DRIVE or use heavy machinery until tomorrow (because of the sedation medicines used during the test).    FOLLOW UP: Our staff will call the number listed on your records the next business day following your procedure to check on you and address any questions or concerns that you may have regarding the information given to you following your procedure. If we do not reach you, we will leave a message.  However, if you are feeling well and you are not experiencing any problems, there is no need to return our call.  We will assume that you have returned to your regular daily activities without incident.  If any biopsies were taken you will be contacted by phone or by letter within the next 1-3 weeks.  Please call us at [phone] if you have not heard about the biopsies in 3 weeks.    SIGNATURES/CONFIDENTIALITY: You and/or your care partner have signed paperwork which will be entered into your electronic medical record.  These signatures attest to the fact that that the information above on your After Visit Summary has been reviewed and is understood.  Full responsibility of the confidentiality  of this discharge information lies with you and/or your care-partner.

## 2018-01-06 NOTE — Progress Notes (Signed)
A/ox3 pleased with MAC, report to RN 

## 2018-01-06 NOTE — Op Note (Signed)
Mathew Hall: Mathew Hall Procedure Date: 01/06/2018 1:22 PM MRN: [id number] Endoscopist: Docia Chuck. Henrene Pastor , MD Age: 77 Referring MD:  Date of Birth: Jan 10, 1941 Gender: Male Account #: [id number] Procedure:                Upper GI endoscopy, With [facility] dilation of the                            esophagus-67f Indications:              Dysphagia, Esophageal reflux Medicines:                Monitored Anesthesia Care Procedure:                Pre-Anesthesia Assessment:                           - Prior to the procedure, a History and Physical                            was performed, and patient medications and                            allergies were reviewed. The patient's tolerance of                            previous anesthesia was also reviewed. The risks                            and benefits of the procedure and the sedation                            options and risks were discussed with the patient.                            All questions were answered, and informed consent                            was obtained. Prior Anticoagulants: The patient has                            taken no previous anticoagulant or antiplatelet                            agents. ASA Grade Assessment: II - A patient with                            mild systemic disease. After reviewing the risks                            and benefits, the patient was deemed in                            satisfactory condition to undergo the procedure.  After obtaining informed consent, the endoscope was                            passed under direct vision. Throughout the                            procedure, the patient's blood pressure, pulse, and                            oxygen saturations were monitored continuously. The                            Endoscope was introduced through the mouth, and                            advanced to the second part of  duodenum. The upper                            GI endoscopy was accomplished without difficulty.                            The patient tolerated the procedure well. Scope In: Scope Out: Findings:                 One benign-appearing, intrinsic mild stenosis was                            found 37 cm from the incisors. This stenosis                            measured 1.5 cm (inner diameter). The scope was                            withdrawn. Dilation was performed with a Maloney                            dilator with no resistance at 67 Fr.                           The exam of the esophagus was otherwise normal.                           The stomach Revealed multiple fundic gland type                            polyps but was otherwise normal. Small sliding                            hiatal hernia present.                           The examined duodenum was normal.                           The cardia and  gastric fundus were normal on                            retroflexion. Complications:            No immediate complications. Estimated Blood Loss:     Estimated blood loss: none. Impression:               1. GERD                           2. Benign esophageal stricture status post dilation                           3. Incidental benign fundic gland polyps and small                            hiatal hernia. Otherwise normal exam. Recommendation:           - Patient has a contact number available for                            emergencies. The signs and symptoms of potential                            delayed complications were discussed with the                            patient. Return to normal activities tomorrow.                            Written discharge instructions were provided to the                            patient.                           - Post dilation diet.                           - Continue present medications. Continue Nexium                             daily.                           - Routine GI follow-up with Dr. Henrene Pastor in 1 year.                            Please contact the office in the interim if you                            have any questions or problems [name], MD 01/06/2018 2:21:58 PM This report has been signed electronically.

## 2018-01-06 NOTE — Progress Notes (Signed)
Called to room to assist during endoscopic procedure.  Patient ID and intended procedure confirmed with present staff. Received instructions for my participation in the procedure from the performing physician.  

## 2018-01-06 NOTE — Op Note (Signed)
[facility] Patient Name: Mathew Hall Procedure Date: 01/06/2018 1:22 PM MRN: [id number] Endoscopist: Docia Chuck. Henrene Pastor , MD Age: 77 Referring MD:  Date of Birth: October 11, 1940 Gender: Male Account #: [id number] Procedure:                Colonoscopy, With cold snare polypectomy x 4 Indications:              High risk colon cancer surveillance: Personal                            history of multiple (3 or more) adenomas Last                            examination 2015 Medicines:                Monitored Anesthesia Care Procedure:                Pre-Anesthesia Assessment:                           - Prior to the procedure, a History and Physical                            was performed, and patient medications and                            allergies were reviewed. The patient's tolerance of                            previous anesthesia was also reviewed. The risks                            and benefits of the procedure and the sedation                            options and risks were discussed with the patient.                            All questions were answered, and informed consent                            was obtained. Prior Anticoagulants: The patient has                            taken no previous anticoagulant or antiplatelet                            agents. ASA Grade Assessment: II - A patient with                            mild systemic disease. After reviewing the risks                            and benefits, the patient was deemed in  satisfactory condition to undergo the procedure.                           After obtaining informed consent, the colonoscope                            was passed under direct vision. Throughout the                            procedure, the patient's blood pressure, pulse, and                            oxygen saturations were monitored continuously. The                            Model CF-HQ190L  [id number]) scope was introduced                            through the anus and advanced to the the cecum,                            identified by appendiceal orifice and ileocecal                            valve. The ileocecal valve, appendiceal orifice,                            and rectum were photographed. The quality of the                            bowel preparation was excellent. The colonoscopy                            was performed without difficulty. The patient                            tolerated the procedure well. The bowel preparation                            used was SUPREP. Scope In: 1:47:10 PM Scope Out: 2:05:41 PM Scope Withdrawal Time: 0 hours 16 minutes 31 seconds  Total Procedure Duration: 0 hours 18 minutes 31 seconds  Findings:                 Four polyps were found in the transverse colon and                            ascending colon. The polyps were 3 to 5 mm in size.                            These polyps were removed with a cold snare.                            Resection and retrieval were complete.  A diffuse area of mild melanosis was found in the                            entire colon.                           The exam was otherwise without abnormality on                            direct and retroflexion views Internal hemorrhoids                            present. Complications:            No immediate complications. Estimated blood loss:                            None. Estimated Blood Loss:     Estimated blood loss: none. Impression:               - Four 3 to 5 mm polyps in the transverse colon and                            in the ascending colon, removed with a cold snare.                            Resected and retrieved.                           - Melanosis in the colon.                           - The examination was otherwise normal on direct                            and retroflexion  views. Recommendation:           - Repeat colonoscopy in 3 years for surveillance.                           - Patient has a contact number available for                            emergencies. The signs and symptoms of potential                            delayed complications were discussed with the                            patient. Return to normal activities tomorrow.                            Written discharge instructions were provided to the                            patient.                           -  Resume previous diet.                           - Continue present medications.                           - Await pathology results. Docia Chuck. Henrene Pastor, MD 01/06/2018 2:18:29 PM This report has been signed electronically.

## 2018-01-11 ENCOUNTER — Telehealth: Payer: Self-pay

## 2018-01-11 ENCOUNTER — Telehealth: Payer: Self-pay | Admitting: *Deleted

## 2018-01-11 ENCOUNTER — Encounter: Payer: Self-pay | Admitting: Internal Medicine

## 2018-01-11 NOTE — Telephone Encounter (Signed)
  Follow up Call-  Call back number 01/06/2018  Post procedure Call Back phone  # [phone]  Permission to leave phone message Yes  Some recent data might be hidden     Patient questions:  Do you have a fever, pain , or abdominal swelling? No. Pain Score  0 *  Have you tolerated food without any problems? Yes.    Have you been able to return to your normal activities? Yes.    Do you have any questions about your discharge instructions: Diet   No. Medications  No. Follow up visit  No.  Do you have questions or concerns about your Care? No.  Actions: * If pain score is 4 or above: No action needed, pain <4.

## 2018-01-11 NOTE — Telephone Encounter (Signed)
  Follow up Call-  Call back number 01/06/2018  Post procedure Call Back phone  # [phone]  Permission to leave phone message Yes  Some recent data might be hidden    No answer.  No ID on voicemail. Aniela Caniglia/Call-back [facility]

## 2018-01-21 ENCOUNTER — Inpatient Hospital Stay: Payer: Medicare Other | Attending: Internal Medicine

## 2018-01-21 ENCOUNTER — Ambulatory Visit (HOSPITAL_COMMUNITY)
Admission: RE | Admit: 2018-01-21 | Discharge: 2018-01-21 | Disposition: A | Discharge status: Home / Self Care | Payer: Medicare Other | Source: Ambulatory Visit | Attending: Internal Medicine | Admitting: Internal Medicine

## 2018-01-21 DIAGNOSIS — Z85118 Personal history of other malignant neoplasm of bronchus and lung: Secondary | ICD-10-CM | POA: Diagnosis present

## 2018-01-21 DIAGNOSIS — C342 Malignant neoplasm of middle lobe, bronchus or lung: Secondary | ICD-10-CM | POA: Insufficient documentation

## 2018-01-21 DIAGNOSIS — R911 Solitary pulmonary nodule: Secondary | ICD-10-CM | POA: Diagnosis not present

## 2018-01-21 LAB — COMPREHENSIVE METABOLIC PANEL
ALBUMIN: 3.9 g/dL (ref 3.5–5.0)
ALT: 14 U/L (ref 0–44)
AST: 16 U/L (ref 15–41)
Alkaline Phosphatase: 84 U/L (ref 38–126)
Anion gap: 8 (ref 5–15)
BUN: 9 mg/dL (ref 8–23)
CALCIUM: 9.7 mg/dL (ref 8.9–10.3)
CO2: 26 mmol/L (ref 22–32)
Chloride: 106 mmol/L (ref 98–111)
Creatinine, Ser: 1.11 mg/dL (ref 0.61–1.24)
GFR calc Af Amer: >60 mL/min (ref >60)
GFR calc non Af Amer: >60 mL/min (ref >60)
GLUCOSE: 102 mg/dL — AB (ref 70–99)
Potassium: 4 mmol/L (ref 3.5–5.1)
Sodium: 140 mmol/L (ref 135–145)
TOTAL PROTEIN: 7.7 g/dL (ref 6.5–8.1)
Total Bilirubin: 0.8 mg/dL (ref 0.3–1.2)

## 2018-01-21 LAB — CBC WITH DIFFERENTIAL/PLATELET
BASOS ABS: 0 10*3/uL (ref 0.0–0.1)
BASOS PCT: 1 %
EOS ABS: 0.1 10*3/uL (ref 0.0–0.5)
Eosinophils Relative: 2 %
HEMATOCRIT: 40.4 % (ref 38.4–49.9)
Hemoglobin: 13.5 g/dL (ref 13.0–17.1)
Lymphocytes Relative: 38 %
Lymphs Abs: 1.5 10*3/uL (ref 0.9–3.3)
MCH: 29.2 pg (ref 27.2–33.4)
MCHC: 33.4 g/dL (ref 32.0–36.0)
MCV: 87.3 fL (ref 79.3–98.0)
MONO ABS: 0.5 10*3/uL (ref 0.1–0.9)
MONOS PCT: 12 %
NEUTROS ABS: 1.9 10*3/uL (ref 1.5–6.5)
NEUTROS PCT: 47 %
Platelets: 186 10*3/uL (ref 140–400)
RBC: 4.63 MIL/uL (ref 4.20–5.82)
RDW: 13.8 % (ref 11.0–14.6)
WBC: 4 10*3/uL (ref 4.0–10.3)

## 2018-01-21 MED ORDER — IOHEXOL 300 MG/ML  SOLN
75.0000 mL | Freq: Once | INTRAMUSCULAR | Status: AC | PRN
  Administered 2018-01-21: 75 mL via INTRAVENOUS

## 2018-01-28 ENCOUNTER — Inpatient Hospital Stay: Payer: Medicare Other | Admitting: Internal Medicine

## 2018-01-28 ENCOUNTER — Encounter: Payer: Self-pay | Admitting: Internal Medicine

## 2018-01-28 DIAGNOSIS — Z85118 Personal history of other malignant neoplasm of bronchus and lung: Secondary | ICD-10-CM

## 2018-01-28 DIAGNOSIS — R911 Solitary pulmonary nodule: Secondary | ICD-10-CM | POA: Diagnosis not present

## 2018-01-28 DIAGNOSIS — C3490 Malignant neoplasm of unspecified part of unspecified bronchus or lung: Secondary | ICD-10-CM

## 2018-01-28 NOTE — Progress Notes (Signed)
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   PRIOR THERAPY:  1. Status post 3 cycles of neoadjuvant chemotherapy with carboplatin and Alimta; last dose was given January 10, 2009, with complete resolution of the left upper lobe mass as well as the subcarinal lymphadenopathy and partial response in the right middle lobe lung lesion. 2. Status post right middle lobectomy with lymph node dissection under the care of Dr. 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  CURRENT THERAPY: Observation.  INTERVAL HISTORY: Mathew Hall 77 y.o. male returns to the clinic today for follow-up visit.  The patient is feeling fine today with no concerning complaints.  He denied having any chest pain, shortness of breath, cough or hemoptysis.  He denied having any weight loss or night sweats.  He has no nausea, vomiting, diarrhea or constipation.  He has no fever or chills.  The patient had repeat CT scan of the chest performed recently and he is here for evaluation and discussion of his discuss results.   MEDICAL HISTORY: Past Medical History:  Diagnosis Date  . Asthma   . CAD (coronary artery disease) 1999   stents to RCA, residual 40% Lt main, decreased EF40%   . Cataract    bilateral-beginning stages  . Colon polyps   . GERD (gastroesophageal reflux disease)   . H/O cardiovascular stress test 1999/2005/2008/2011   last with inf scar/infarct, EF 54%  . H/O echocardiogram 2008   EF 50-55%, Mild MR, Mild TR,    . Hemorrhoids   . 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 Hyperlipemia   . Hypertension   . lung ca dx'd 10/2008   chemo comp  01/2009-stage 4 lung cancer  . Myocardial infarction ([place]) 02-18-1998  . Peptic stricture of esophagus   . Right carotid bruit 04/01/12   minimal carotid stenosis on doppler    ALLERGIES:  is allergic to lipitor [atorvastatin calcium] and atorvastatin.  MEDICATIONS:  Current Outpatient Medications  Medication Sig Dispense Refill  . amLODipine (NORVASC) 2.5 MG tablet Take 2.5 mg by mouth daily.  2  . aspirin 325 MG EC tablet Take 325 mg by mouth daily.      . colesevelam (WELCHOL) 625 MG tablet Take 2 tablets (1,250 mg total) by mouth 2 (two) times daily with a meal. (Patient not taking: Reported on 01/06/2018) 120 tablet 6  . esomeprazole (NEXIUM) 20 MG capsule Take 20 mg by mouth daily at 12 noon.    . metoprolol tartrate (LOPRESSOR) 25 MG tablet Take 25 mg by mouth 2 (two) times daily.    . nitroGLYCERIN (NITROSTAT) 0.4 MG SL tablet Place 1 tablet (0.4 mg total) under the tongue every 5 (five) minutes as needed for chest pain. (Patient not taking: Reported on 01/06/2018) 90 tablet 3  . oxybutynin (DITROPAN) 5 MG tablet TAKE 1 TABLET BY MOUTH EVERY 8 HOURS AS NEEDED FOR BLADDER SPASMS  2  . simvastatin (ZOCOR) 40 MG tablet Take 1 tablet (40 mg total) by mouth every evening. 90 tablet 3   Current Facility-Administered Medications  Medication Dose Route Frequency Provider Last Rate Last Dose  . 0.9 %  sodium chloride infusion  500 mL Intravenous Once Henrene Pastor,  Docia Chuck, MD        SURGICAL HISTORY:  Past Surgical History:  Procedure Laterality Date  . ANGIOPLASTY     with 2 stent placement  . COLONOSCOPY    . CORONARY ANGIOPLASTY WITH STENT PLACEMENT  02/18/1998   PCI/stents to ostial and mid RCA, emergently, residual Lt main disease  . LUNG SURGERY     right  . POLYPECTOMY     [facility] 09-21-08    REVIEW OF SYSTEMS:  A comprehensive review of systems was negative.   PHYSICAL EXAMINATION: General appearance: alert, cooperative and no distress Head: Normocephalic, without obvious abnormality,  atraumatic Neck: no adenopathy Lymph nodes: Cervical, supraclavicular, and axillary nodes normal. Resp: clear to auscultation bilaterally Back: symmetric, no curvature. ROM normal. No CVA tenderness. Cardio: regular rate and rhythm, S1, S2 normal, no murmur, click, rub or gallop GI: soft, non-tender; bowel sounds normal; no masses,  no organomegaly Extremities: extremities normal, atraumatic, no cyanosis or edema  ECOG PERFORMANCE STATUS: 0 - Asymptomatic  Blood pressure (!) 157/81, pulse (!) 51, temperature 97.7 F (36.5 C), temperature source Oral, resp. rate 17, height 5\' 10"  (1.778 m), weight 173 lb 4.8 oz (78.6 kg), SpO2 100 %.  LABORATORY DATA: Lab Results  Component Value Date   WBC 4.0 01/21/2018   HGB 13.5 01/21/2018   HCT 40.4 01/21/2018   MCV 87.3 01/21/2018   PLT 186 01/21/2018      Chemistry      Component Value Date/Time   NA 140 01/21/2018 0855   NA 141 01/16/2017 0753   K 4.0 01/21/2018 0855   K 4.2 01/16/2017 0753   CL 106 01/21/2018 0855   CL 105 07/13/2012 0848   CO2 26 01/21/2018 0855   CO2 27 01/16/2017 0753   BUN 9 01/21/2018 0855   BUN 16.6 01/16/2017 0753   CREATININE 1.11 01/21/2018 0855   CREATININE 1.1 01/16/2017 0753      Component Value Date/Time   CALCIUM 9.7 01/21/2018 0855   CALCIUM 9.9 01/16/2017 0753   ALKPHOS 84 01/21/2018 0855   ALKPHOS 95 01/16/2017 0753   AST 16 01/21/2018 0855   AST 15 01/16/2017 0753   ALT 14 01/21/2018 0855   ALT 11 01/16/2017 0753   BILITOT 0.8 01/21/2018 0855   BILITOT 0.75 01/16/2017 0753       RADIOGRAPHIC STUDIES: Ct Chest W Contrast  Result Date: 01/21/2018 CLINICAL DATA:  Chemotherapy completed 2010. lung carcinoma. Wedge resection EXAM: CT CHEST WITH CONTRAST TECHNIQUE: Multidetector CT imaging of the chest was performed during intravenous contrast administration. CONTRAST:  11mL OMNIPAQUE IOHEXOL 300 MG/ML  SOLN COMPARISON:  CT 01/16/2017 FINDINGS: Cardiovascular: Coronary artery calcification  and aortic atherosclerotic calcification. Mediastinum/Nodes: No axillary or supraclavicular adenopathy. No mediastinal hilar adenopathy. No pericardial effusion. Esophagus normal. Lungs/Pleura: 4 mm nodule in the RIGHT upper lobe is new from comparison exam (image 43/5). This nodule has thin linear extension to the pleural surface. Wedge resection margin in the RIGHT upper lobe unchanged. New branching nodular pattern in the superior segment of the RIGHT lower lobe (image 61 through 76 series 5). The LEFT lung is hyperexpanded.  No nodularity. Upper Abdomen: Limited view of the liver, kidneys, pancreas are unremarkable. Normal adrenal glands. Musculoskeletal: No aggressive osseous lesion. IMPRESSION: 1. New nodule in the RIGHT upper lobe. Favor inflammatory nodule; however recommend short-term (3-6 months) follow-up. Nodule superior to the wedge resection. 2. Branching nodular pattern in the superior segment of the RIGHT lower lobe is favored an infectious or  inflammatory process. 3. LEFT lung clear. 4. No lymphadenopathy Electronically Signed   By: Suzy Bouchard M.D.   On: 01/21/2018 14:51   ASSESSMENT AND PLAN: This is a very pleasant 77 years old white male with history of metastatic non-small cell lung cancer status post neoadjuvant chemotherapy followed by right middle lobectomy with lymph node dissection has been observation since August of 2010 with no evidence for disease recurrence.  The patient is current on observation and he is feeling fine.  Repeat CT scan of the chest showed no concerning findings for disease recurrence except for new 4 mm nodule in the right upper lobe suspicious for inflammatory nodule but short-term follow-up was recommended. I personally and independently reviewed the scan images and discussed the results with the patient today.  I recommended for him to continue in observation with repeat CT scan of the chest in 6 months for restaging of his disease. The patient was  advised to call immediately if he has any concerning symptoms in the interval. All questions were answered. The patient knows to call the clinic with any problems, questions or concerns. We can certainly see the patient much sooner if necessary.  Disclaimer: This note was dictated with voice recognition software. Similar sounding words can inadvertently be transcribed and may be missed upon review.

## 2018-07-30 ENCOUNTER — Inpatient Hospital Stay: Payer: Medicare Other | Attending: Internal Medicine

## 2018-07-30 ENCOUNTER — Ambulatory Visit (HOSPITAL_COMMUNITY)
Admission: RE | Admit: 2018-07-30 | Discharge: 2018-07-30 | Disposition: A | Discharge status: Home / Self Care | Payer: Medicare Other | Source: Ambulatory Visit | Attending: Internal Medicine | Admitting: Internal Medicine

## 2018-07-30 DIAGNOSIS — C3490 Malignant neoplasm of unspecified part of unspecified bronchus or lung: Secondary | ICD-10-CM

## 2018-07-30 DIAGNOSIS — Z85118 Personal history of other malignant neoplasm of bronchus and lung: Secondary | ICD-10-CM | POA: Insufficient documentation

## 2018-07-30 DIAGNOSIS — R911 Solitary pulmonary nodule: Secondary | ICD-10-CM | POA: Insufficient documentation

## 2018-07-30 LAB — CMP (CANCER CENTER ONLY)
ALT: 12 U/L (ref 0–44)
AST: 14 U/L — ABNORMAL LOW (ref 15–41)
Albumin: 3.9 g/dL (ref 3.5–5.0)
Alkaline Phosphatase: 74 U/L (ref 38–126)
Anion gap: 9 (ref 5–15)
BUN: 15 mg/dL (ref 8–23)
CO2: 26 mmol/L (ref 22–32)
Calcium: 9.6 mg/dL (ref 8.9–10.3)
Chloride: 105 mmol/L (ref 98–111)
Creatinine: 1.07 mg/dL (ref 0.61–1.24)
GFR, Est AFR Am: >60 mL/min (ref >60)
GFR, Estimated: >60 mL/min (ref >60)
Glucose, Bld: 104 mg/dL — ABNORMAL HIGH (ref 70–99)
Potassium: 4 mmol/L (ref 3.5–5.1)
Sodium: 140 mmol/L (ref 135–145)
TOTAL PROTEIN: 7.8 g/dL (ref 6.5–8.1)
Total Bilirubin: 1 mg/dL (ref 0.3–1.2)

## 2018-07-30 LAB — CBC WITH DIFFERENTIAL (CANCER CENTER ONLY)
Abs Immature Granulocytes: 0.01 10*3/uL (ref 0.00–0.07)
BASOS PCT: 1 %
Basophils Absolute: 0.1 10*3/uL (ref 0.0–0.1)
Eosinophils Absolute: 0.1 10*3/uL (ref 0.0–0.5)
Eosinophils Relative: 2 %
HCT: 39.3 % (ref 39.0–52.0)
Hemoglobin: 12.9 g/dL — ABNORMAL LOW (ref 13.0–17.0)
Immature Granulocytes: 0 %
Lymphocytes Relative: 26 %
Lymphs Abs: 1.2 10*3/uL (ref 0.7–4.0)
MCH: 29.1 pg (ref 26.0–34.0)
MCHC: 32.8 g/dL (ref 30.0–36.0)
MCV: 88.7 fL (ref 80.0–100.0)
Monocytes Absolute: 0.5 10*3/uL (ref 0.1–1.0)
Monocytes Relative: 10 %
Neutro Abs: 2.9 10*3/uL (ref 1.7–7.7)
Neutrophils Relative %: 61 %
PLATELETS: 203 10*3/uL (ref 150–400)
RBC: 4.43 MIL/uL (ref 4.22–5.81)
RDW: 13.1 % (ref 11.5–15.5)
WBC Count: 4.7 10*3/uL (ref 4.0–10.5)
nRBC: 0 % (ref 0.0–0.2)

## 2018-07-30 MED ORDER — SODIUM CHLORIDE (PF) 0.9 % IJ SOLN
INTRAMUSCULAR | Status: AC
  Filled 2018-07-30: qty 50

## 2018-07-30 MED ORDER — IOHEXOL 300 MG/ML  SOLN
75.0000 mL | Freq: Once | INTRAMUSCULAR | Status: AC | PRN
  Administered 2018-07-30: 75 mL via INTRAVENOUS

## 2018-08-02 ENCOUNTER — Other Ambulatory Visit: Payer: Self-pay | Admitting: Cardiovascular Disease

## 2018-08-02 ENCOUNTER — Telehealth: Payer: Self-pay

## 2018-08-02 ENCOUNTER — Encounter: Payer: Self-pay | Admitting: Internal Medicine

## 2018-08-02 ENCOUNTER — Inpatient Hospital Stay: Payer: Medicare Other | Admitting: Internal Medicine

## 2018-08-02 VITALS — BP 153/74 | HR 50 | Temp 97.7°F | Resp 18 | Ht 70.0 in | Wt 175.8 lb

## 2018-08-02 DIAGNOSIS — C342 Malignant neoplasm of middle lobe, bronchus or lung: Secondary | ICD-10-CM

## 2018-08-02 DIAGNOSIS — R0982 Postnasal drip: Secondary | ICD-10-CM

## 2018-08-02 DIAGNOSIS — Z85118 Personal history of other malignant neoplasm of bronchus and lung: Secondary | ICD-10-CM | POA: Diagnosis not present

## 2018-08-02 DIAGNOSIS — R911 Solitary pulmonary nodule: Secondary | ICD-10-CM | POA: Diagnosis not present

## 2018-08-02 DIAGNOSIS — C3490 Malignant neoplasm of unspecified part of unspecified bronchus or lung: Secondary | ICD-10-CM

## 2018-08-02 NOTE — Telephone Encounter (Signed)
Printed avs and calender of upcoming appointment. Per 1/7 los 

## 2018-08-02 NOTE — Progress Notes (Signed)
[place] [phone]   [phone]  OFFICE PROGRESS NOTE  [name], MD [place] [address]  DIAGNOSIS: Metastatic nonsmall cell lung cancer, adenocarcinoma, diagnosed in May 2010.   PRIOR THERAPY:  1. Status post 3 cycles of neoadjuvant chemotherapy with carboplatin and Alimta; last dose was given January 10, 2009, with complete resolution of the left upper lobe mass as well as the subcarinal lymphadenopathy and partial response in the right middle lobe lung lesion. 2. Status post right middle lobectomy with lymph node dissection under the care of Dr. 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  CURRENT THERAPY: Observation.  INTERVAL HISTORY: Mathew Hall 78 y.o. male returns to the clinic today for follow up visit.  The patient is feeling fine today with no concerning complaints.  He denied having any recent chest pain, shortness of breath, cough or hemoptysis.  He continues to have some postnasal drainage.  He denied having any fever or chills.  He has no weight loss or night sweats.  He has no nausea, vomiting, diarrhea or constipation.  The patient had repeat CT scan of the chest performed recently and he is here for evaluation and discussion of his scan results.   MEDICAL HISTORY: Past Medical History:  Diagnosis Date  . Asthma   . CAD (coronary artery disease) 1999   stents to RCA, residual 40% Lt main, decreased EF40%   . Cataract    bilateral-beginning stages  . Colon polyps   . GERD (gastroesophageal reflux disease)   . H/O cardiovascular stress test 1999/2005/2008/2011   last with inf scar/infarct, EF 54%  . H/O echocardiogram 2008   EF 50-55%, Mild MR, Mild TR,    . Hemorrhoids   . 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 Hyperlipemia   .  Hypertension   . lung ca dx'd 10/2008   chemo comp 01/2009-stage 4 lung cancer  . Myocardial infarction ([place]) 02-18-1998  . Peptic stricture of esophagus   . Right carotid bruit 04/01/12   minimal carotid stenosis on doppler    ALLERGIES:  is allergic to lipitor [atorvastatin calcium] and atorvastatin.  MEDICATIONS:  Current Outpatient Medications  Medication Sig Dispense Refill  . amLODipine (NORVASC) 2.5 MG tablet Take 2.5 mg by mouth daily.  2  . aspirin 325 MG EC tablet Take 325 mg by mouth daily.      Marland Kitchen esomeprazole (NEXIUM) 20 MG capsule Take 20 mg by mouth daily at 12 noon.    . metoprolol tartrate (LOPRESSOR) 25 MG tablet Take 25 mg by mouth 2 (two) times daily.    . nitroGLYCERIN (NITROSTAT) 0.4 MG SL tablet Place 1 tablet (0.4 mg total) under the tongue every 5 (five) minutes as needed for chest pain. (Patient not taking: Reported on 01/06/2018) 90 tablet 3  . oxybutynin (DITROPAN) 5 MG tablet TAKE 1 TABLET BY MOUTH EVERY 8 HOURS AS NEEDED FOR BLADDER SPASMS  2  . simvastatin (ZOCOR) 40 MG tablet Take 1 tablet (40 mg total) by mouth every evening. 90 tablet 3   Current Facility-Administered Medications  Medication Dose Route Frequency Provider Last Rate Last Dose  . 0.9 %  sodium chloride infusion  500 mL Intravenous Once [name], MD        SURGICAL HISTORY:  Past Surgical History:  Procedure Laterality Date  .  ANGIOPLASTY     with 2 stent placement  . COLONOSCOPY    . CORONARY ANGIOPLASTY WITH STENT PLACEMENT  02/18/1998   PCI/stents to ostial and mid RCA, emergently, residual Lt main disease  . LUNG SURGERY     right  . POLYPECTOMY     HPP 09-21-08    REVIEW OF SYSTEMS:  A comprehensive review of systems was negative.   PHYSICAL EXAMINATION: General appearance: alert, cooperative and no distress Head: Normocephalic, without obvious abnormality, atraumatic Neck: no adenopathy Lymph nodes: Cervical, supraclavicular, and axillary nodes normal. Resp: clear to  auscultation bilaterally Back: symmetric, no curvature. ROM normal. No CVA tenderness. Cardio: regular rate and rhythm, S1, S2 normal, no murmur, click, rub or gallop GI: soft, non-tender; bowel sounds normal; no masses,  no organomegaly Extremities: extremities normal, atraumatic, no cyanosis or edema  ECOG PERFORMANCE STATUS: 0 - Asymptomatic  Blood pressure (!) 153/74, pulse (!) 50, temperature 97.7 F (36.5 C), temperature source Oral, resp. rate 18, height 5\' 10"  (1.778 m), weight 175 lb 12.8 oz (79.7 kg), SpO2 98 %.  LABORATORY DATA: Lab Results  Component Value Date   WBC 4.7 07/30/2018   HGB 12.9 (L) 07/30/2018   HCT 39.3 07/30/2018   MCV 88.7 07/30/2018   PLT 203 07/30/2018      Chemistry      Component Value Date/Time   NA 140 07/30/2018 0809   NA 141 01/16/2017 0753   K 4.0 07/30/2018 0809   K 4.2 01/16/2017 0753   CL 105 07/30/2018 0809   CL 105 07/13/2012 0848   CO2 26 07/30/2018 0809   CO2 27 01/16/2017 0753   BUN 15 07/30/2018 0809   BUN 16.6 01/16/2017 0753   CREATININE 1.07 07/30/2018 0809   CREATININE 1.1 01/16/2017 0753      Component Value Date/Time   CALCIUM 9.6 07/30/2018 0809   CALCIUM 9.9 01/16/2017 0753   ALKPHOS 74 07/30/2018 0809   ALKPHOS 95 01/16/2017 0753   AST 14 (L) 07/30/2018 0809   AST 15 01/16/2017 0753   ALT 12 07/30/2018 0809   ALT 11 01/16/2017 0753   BILITOT 1.0 07/30/2018 0809   BILITOT 0.75 01/16/2017 0753       RADIOGRAPHIC STUDIES: Ct Chest W Contrast  Result Date: 07/30/2018 CLINICAL DATA:  Bilateral lung cancer with metastases. Status post right middle lobe wedge resection. EXAM: CT CHEST WITH CONTRAST TECHNIQUE: Multidetector CT imaging of the chest was performed during intravenous contrast administration. CONTRAST:  76mL OMNIPAQUE IOHEXOL 300 MG/ML  SOLN COMPARISON:  01/21/2018 FINDINGS: Cardiovascular: The heart size is normal. No substantial pericardial effusion. Coronary artery calcification is evident.  Atherosclerotic calcification is noted in the wall of the thoracic aorta. Mediastinum/Nodes: No mediastinal lymphadenopathy. There is no hilar lymphadenopathy. The esophagus has normal imaging features. There is no axillary lymphadenopathy. Lungs/Pleura: The central tracheobronchial airways are patent. The new 4 mm nodule identified in the peripheral right upper lobe on the previous study is stable at 4 mm. Since the prior exam, the patient has developed additional clustered ill-defined and ground-glass nodules in the upper and posterior right upper lobe, clustered near the original nodule seen on the prior study. Tree-in-bud nodularity posterior right lower lobe (94/5 and 116/5) is also progressive in the interval. Underlying changes of centrilobular emphysema noted. No suspicious nodule or mass evident in the left lung. No pleural effusion. Stable appearance of the right lung staple line. Upper Abdomen: Unremarkable. Musculoskeletal: No worrisome lytic or sclerotic osseous abnormality. IMPRESSION: 1. Interval  development of clustered ill-defined and ground-glass nodules in the upper and posterior right upper lobe, clustered near the new nodule seen on the prior study. Imaging features are most suggestive of an infectious/inflammatory etiology. Atypical infection would be a consideration. 2. Progressive tree-in-bud nodularity in the right lower lobe, also suspicious or atypical infection. 3.  Aortic Atherosclerois (ICD10-170.0) 4.  Emphysema. (KHV74-B34.9) Electronically Signed   By: Misty Stanley M.D.   On: 07/30/2018 09:51   ASSESSMENT AND PLAN: This is a very pleasant 78 years old white male with history of metastatic non-small cell lung cancer status post neoadjuvant chemotherapy followed by right middle lobectomy with lymph node dissection has been observation since August of 2010 with no evidence for disease recurrence.  The patient has been in observation for close to 10 years now.  His recent CT scan of  the chest showed no concerning findings except for ill-defined and groundglass nodules in the upper and posterior right upper lobe suspicious for inflammatory process.  The patient is currently asymptomatic. I recommended for him to continue on observation with repeat CT scan of the chest in 1 year.  He was also advised to call immediately if he has any concerning symptoms in the interval.  He will continue his routine follow-up visit and evaluation by his primary care physician in the interval. All questions were answered. The patient knows to call the clinic with any problems, questions or concerns. We can certainly see the patient much sooner if necessary.  Disclaimer: This note was dictated with voice recognition software. Similar sounding words can inadvertently be transcribed and may be missed upon review.

## 2018-09-01 ENCOUNTER — Ambulatory Visit: Payer: Medicare Other | Admitting: Cardiovascular Disease

## 2018-09-01 ENCOUNTER — Encounter: Payer: Self-pay | Admitting: Cardiovascular Disease

## 2018-09-01 DIAGNOSIS — I2101 ST elevation (STEMI) myocardial infarction involving left main coronary artery: Secondary | ICD-10-CM | POA: Diagnosis not present

## 2018-09-01 DIAGNOSIS — I10 Essential (primary) hypertension: Secondary | ICD-10-CM

## 2018-09-01 NOTE — Progress Notes (Signed)
09/01/2018 JOSTEN WARMUTH   1941-06-28  [id number]  Primary Physician [name], MD Primary Cardiologist: Lorretta Harp MD [name], [place]  HPI:  Mathew Hall is a 78 y.o.  mildly overweight married Caucasian male father of 2, grandfather of 31 grandchildren who I saw2/26/2019 he has a history of CAD status post acute inferior wall myocardial infarction treated percutaneously with stenting February 18, 1998 with intervention of his ostium and mid dominant RCA. He did have a 40% ostial left main stenosis and an EF of 40%, inferior akinesis at that time. He had a normal abdominal aorta, renal arteries and iliac arteries. Followup echo performed in 2008 showed normal LV systolic function, and his most recent Myoview performed Nov 21, 2009 was normal as well. His other problems include hypertension, hyperlipidemia and GERD. Dr. Philip Aspen follows his lipid profile which most recently performed 08/23/2018 revealed total cholesterol 129, LDL of 80 and HDL of 34.  Since I saw him a year ago he is remained asymptomatic denying chest pain or shortness of breath.  Current Meds  Medication Sig  . amLODipine (NORVASC) 5 MG tablet Take 1 tablet by mouth daily.  Marland Kitchen aspirin 325 MG EC tablet Take 325 mg by mouth daily.    . ergocalciferol (VITAMIN D2) 1.25 MG (50000 UT) capsule Take 50,000 Units by mouth once a week.  . esomeprazole (NEXIUM) 20 MG capsule Take 20 mg by mouth daily at 12 noon.  . metoprolol tartrate (LOPRESSOR) 25 MG tablet Take 25 mg by mouth 2 (two) times daily.  . nitroGLYCERIN (NITROSTAT) 0.4 MG SL tablet Place 1 tablet (0.4 mg total) under the tongue every 5 (five) minutes as needed for chest pain.  Marland Kitchen oxybutynin (DITROPAN) 5 MG tablet TAKE 1 TABLET BY MOUTH EVERY 8 HOURS AS NEEDED FOR BLADDER SPASMS  . simvastatin (ZOCOR) 40 MG tablet TAKE 1 TABLET BY MOUTH EVERY DAY IN THE EVENING   Current Facility-Administered Medications for the 09/01/18 encounter (Office Visit)  with Lorretta Harp, MD  Medication  . 0.9 %  sodium chloride infusion     Allergies  Allergen Reactions  . Lipitor [Atorvastatin Calcium]     Knee pain   . Atorvastatin Other (See Comments)    Joint/knee pain    Social History   Socioeconomic History  . Marital status: Married    Spouse name: Not on file  . Number of children: 2  . Years of education: Not on file  . Highest education level: Not on file  Occupational History  . Occupation: retired  Scientific laboratory technician  . Financial resource strain: Not on file  . Food insecurity:    Worry: Not on file    Inability: Not on file  . Transportation needs:    Medical: Not on file    Non-medical: Not on file  Tobacco Use  . Smoking status: Former Smoker    Last attempt to quit: 07/16/1997    Years since quitting: 21.1  . Smokeless tobacco: Never Used  Substance and Sexual Activity  . Alcohol use: Yes    Comment: occasionally  . Drug use: No  . Sexual activity: Not on file  Lifestyle  . Physical activity:    Days per week: Not on file    Minutes per session: Not on file  . Stress: Not on file  Relationships  . Social connections:    Talks on phone: Not on file    Gets together: Not on file  Attends religious service: Not on file    Active member of club or organization: Not on file    Attends meetings of clubs or organizations: Not on file    Relationship status: Not on file  . Intimate partner violence:    Fear of current or ex partner: Not on file    Emotionally abused: Not on file    Physically abused: Not on file    Forced sexual activity: Not on file  Other Topics Concern  . Not on file  Social History Narrative  . Not on file     Review of Systems: General: negative for chills, fever, night sweats or weight changes.  Cardiovascular: negative for chest pain, dyspnea on exertion, edema, orthopnea, palpitations, paroxysmal nocturnal dyspnea or shortness of breath Dermatological: negative for  rash Respiratory: negative for cough or wheezing Urologic: negative for hematuria Abdominal: negative for nausea, vomiting, diarrhea, bright red blood per rectum, melena, or hematemesis Neurologic: negative for visual changes, syncope, or dizziness All other systems reviewed and are otherwise negative except as noted above.    Blood pressure (!) 166/78, pulse (!) 44, height 5\' 10"  (1.778 m), weight 174 lb 12.8 oz (79.3 kg).  General appearance: alert and no distress Neck: no adenopathy, no carotid bruit, no JVD, supple, symmetrical, trachea midline and thyroid not enlarged, symmetric, no tenderness/mass/nodules Lungs: clear to auscultation bilaterally Heart: regular rate and rhythm, S1, S2 normal, no murmur, click, rub or gallop Extremities: extremities normal, atraumatic, no cyanosis or edema Pulses: 2+ and symmetric Skin: Skin color, texture, turgor normal. No rashes or lesions Neurologic: Alert and oriented X 3, normal strength and tone. Normal symmetric reflexes. Normal coordination and gait  EKG sinus bradycardia at 44 with right bundle branch block.  I personally reviewed this EKG.  ASSESSMENT AND PLAN:   HYPERLIPIDEMIA History of hyperlipidemia on simvastatin with lipid profile performed 08/23/2018 revealing total cholesterol 129, LDL of 80 and HDL 34.  Essential hypertension History of essential hypertension blood pressure measured today 166/78.  His amlodipine was recently increased by his PCP from 2.5 to 5 mg a day.  He is on metoprolol as well.  He admits to dietary indiscretion with regards to salt.  Of asked him to keep a blood pressure log daily for a month and he will see Cyril Mourning back for review and titration.  Acute myocardial infarction History of CAD status post inferior STEMI 02/18/1998 treated with intervention of the ostium and mid dominant RCA by myself.  He did have a 40% ostial left main stenosis and an EF of 40% with inferior akinesis at that time.  His most  recent Myoview performed 11/21/2009 was nonischemic.  He denies chest pain or shortness of breath.      Lorretta Harp MD FACP,FACC,FAHA, [facility] 09/01/2018 10:58 AM

## 2018-09-01 NOTE — Patient Instructions (Addendum)
Medication Instructions:  Your physician recommends that you continue on your current medications as directed. Please refer to the Current Medication list given to you today.  If you need a refill on your cardiac medications before your next appointment, please call your pharmacy.   Lab work: NONE If you have labs (blood work) drawn today and your tests are completely normal, you will receive your results only by: Marland Kitchen MyChart Message (if you have MyChart) OR . A paper copy in the mail If you have any lab test that is abnormal or we need to change your treatment, we will call you to review the results.  Testing/Procedures: NONE  Follow-Up: At [facility], you and your health needs are our priority.  As part of our continuing mission to provide you with exceptional heart care, we have created designated Provider Care Teams.  These Care Teams include your primary Cardiologist (physician) and Advanced Practice Providers (APPs -  Physician Assistants and Nurse Practitioners) who all work together to provide you with the care you need, when you need it. . You will need a follow up appointment in 12 months.  Please call our office 2 months in advance to schedule this appointment.  You may see Dr. Gwenlyn Found or one of the following Advanced Practice Providers on your designated Care Team:   . Kerin Ransom, [place] . Almyra Deforest, PA-C . Fabian Sharp, PA-C . Jory Sims, DNP . Rosaria Ferries, PA-C . Roby Lofts, PA-C . Sande Rives, PA-C  Any Other Special Instructions Will Be Listed Below (If Applicable). PLEASE KEEP A DAILY BLOOD PRESSURE LOG FOR 30 DAYS THEN FOLLOW UP WITH A CLINICAL PHARMACIST IN THE HYPERTENSION CLINIC

## 2018-09-01 NOTE — Assessment & Plan Note (Signed)
History of hyperlipidemia on simvastatin with lipid profile performed 08/23/2018 revealing total cholesterol 129, LDL of 80 and HDL 34.

## 2018-09-01 NOTE — Assessment & Plan Note (Signed)
History of essential hypertension blood pressure measured today 166/78.  His amlodipine was recently increased by his PCP from 2.5 to 5 mg a day.  He is on metoprolol as well.  He admits to dietary indiscretion with regards to salt.  Of asked him to keep a blood pressure log daily for a month and he will see Cyril Mourning back for review and titration.

## 2018-09-01 NOTE — Assessment & Plan Note (Signed)
History of CAD status post inferior STEMI 02/18/1998 treated with intervention of the ostium and mid dominant RCA by myself.  He did have a 40% ostial left main stenosis and an EF of 40% with inferior akinesis at that time.  His most recent Myoview performed 11/21/2009 was nonischemic.  He denies chest pain or shortness of breath.

## 2018-10-01 ENCOUNTER — Ambulatory Visit: Payer: Medicare Other

## 2018-11-01 ENCOUNTER — Other Ambulatory Visit: Payer: Self-pay | Admitting: Cardiovascular Disease

## 2018-11-02 NOTE — Telephone Encounter (Signed)
Simvastatin refilled. 

## 2018-11-15 ENCOUNTER — Telehealth: Payer: Self-pay | Admitting: Pharmacist Clinician (PhC)/ Clinical Pharmacy Specialist

## 2018-11-15 NOTE — Telephone Encounter (Signed)
Patient was scheduled for PharmD follow up in late March/early April after having seen Dr. Gwenlyn Found with an elevated BP.  Appointment cancelled due to COVID-19 outbreak.  Patient reported doing well and was able to drop off home BP readings last Friday (via wife at [facility]).  Spoke with patient via phone this am.  Approximately 7 minutes.  He reports doing well, states at the time he saw Dr. Gwenlyn Found, his amlodipine had been increased only the day before, from 2.5 mg to 5 mg daily).  He had been eating more sodium than he should and some family members were causing he and his wife extra stress.    Since then, he has cut back on salt.  His wife does not cook with it and he only adds to 3-4 specific foods.  He has been very cautious about what he eats.  He also notes family stressors have decreased in the past month.  Patient notes he does no formal exercise, but has an acre of property and does all yardwork on his own.  He also helps with housework.    Home BP reading for the past 2 months (22 morning readings), show an average BP of 140/77 with a range of 134-147/66-77.  He had 3 evening readings in that time, averaging 139/72.    His BP appears quite stable and patient feels well with current medications.  He is not quite to guideline goal of 130/80, however is doing quite well with a very tight range of BP readings.  Will not make any changes to his medications at this time, but did advise that he continue with regular home checks 2-3 times per week and let us know should he have any further concerns.

## 2019-02-03 ENCOUNTER — Other Ambulatory Visit: Payer: Self-pay | Admitting: Cardiovascular Disease

## 2019-02-04 NOTE — Telephone Encounter (Signed)
Rx request sent to pharmacy.  

## 2019-02-09 ENCOUNTER — Encounter: Payer: Self-pay | Admitting: Internal Medicine

## 2019-07-29 ENCOUNTER — Ambulatory Visit (HOSPITAL_COMMUNITY)
Admission: RE | Admit: 2019-07-29 | Discharge: 2019-07-29 | Disposition: A | Discharge status: Home / Self Care | Payer: Medicare Other | Source: Ambulatory Visit | Attending: Internal Medicine | Admitting: Internal Medicine

## 2019-07-29 ENCOUNTER — Inpatient Hospital Stay: Payer: Medicare Other | Attending: Internal Medicine

## 2019-07-29 ENCOUNTER — Other Ambulatory Visit: Payer: Self-pay

## 2019-07-29 DIAGNOSIS — Z85118 Personal history of other malignant neoplasm of bronchus and lung: Secondary | ICD-10-CM | POA: Diagnosis present

## 2019-07-29 DIAGNOSIS — Z79899 Other long term (current) drug therapy: Secondary | ICD-10-CM | POA: Diagnosis not present

## 2019-07-29 DIAGNOSIS — C3490 Malignant neoplasm of unspecified part of unspecified bronchus or lung: Secondary | ICD-10-CM | POA: Insufficient documentation

## 2019-07-29 LAB — CBC WITH DIFFERENTIAL (CANCER CENTER ONLY)
Abs Immature Granulocytes: 0.03 10*3/uL (ref 0.00–0.07)
Basophils Absolute: 0.1 10*3/uL (ref 0.0–0.1)
Basophils Relative: 1 %
Eosinophils Absolute: 0.1 10*3/uL (ref 0.0–0.5)
Eosinophils Relative: 3 %
HCT: 42.4 % (ref 39.0–52.0)
Hemoglobin: 13.9 g/dL (ref 13.0–17.0)
Immature Granulocytes: 1 %
Lymphocytes Relative: 37 %
Lymphs Abs: 1.7 10*3/uL (ref 0.7–4.0)
MCH: 29 pg (ref 26.0–34.0)
MCHC: 32.8 g/dL (ref 30.0–36.0)
MCV: 88.3 fL (ref 80.0–100.0)
Monocytes Absolute: 0.5 10*3/uL (ref 0.1–1.0)
Monocytes Relative: 10 %
Neutro Abs: 2.2 10*3/uL (ref 1.7–7.7)
Neutrophils Relative %: 48 %
Platelet Count: 198 10*3/uL (ref 150–400)
RBC: 4.8 MIL/uL (ref 4.22–5.81)
RDW: 13.8 % (ref 11.5–15.5)
WBC Count: 4.6 10*3/uL (ref 4.0–10.5)
nRBC: 0 % (ref 0.0–0.2)

## 2019-07-29 LAB — CMP (CANCER CENTER ONLY)
ALT: 8 U/L (ref 0–44)
AST: 14 U/L — ABNORMAL LOW (ref 15–41)
Albumin: 4.2 g/dL (ref 3.5–5.0)
Alkaline Phosphatase: 87 U/L (ref 38–126)
Anion gap: 10 (ref 5–15)
BUN: 15 mg/dL (ref 8–23)
CO2: 26 mmol/L (ref 22–32)
Calcium: 9.5 mg/dL (ref 8.9–10.3)
Chloride: 106 mmol/L (ref 98–111)
Creatinine: 1.15 mg/dL (ref 0.61–1.24)
GFR, Est AFR Am: >60 mL/min (ref >60)
GFR, Estimated: >60 mL/min (ref >60)
Glucose, Bld: 96 mg/dL (ref 70–99)
Potassium: 4 mmol/L (ref 3.5–5.1)
Sodium: 142 mmol/L (ref 135–145)
Total Bilirubin: 0.9 mg/dL (ref 0.3–1.2)
Total Protein: 8.1 g/dL (ref 6.5–8.1)

## 2019-07-29 MED ORDER — IOHEXOL 300 MG/ML  SOLN
75.0000 mL | Freq: Once | INTRAMUSCULAR | Status: AC | PRN
  Administered 2019-07-29: 13:00:00 75 mL via INTRAVENOUS

## 2019-08-01 ENCOUNTER — Other Ambulatory Visit: Payer: Self-pay

## 2019-08-01 ENCOUNTER — Inpatient Hospital Stay: Payer: Medicare Other | Admitting: Internal Medicine

## 2019-08-01 ENCOUNTER — Encounter: Payer: Self-pay | Admitting: Internal Medicine

## 2019-08-01 VITALS — BP 152/79 | HR 51 | Temp 98.0°F | Resp 17 | Ht 70.0 in | Wt 174.7 lb

## 2019-08-01 DIAGNOSIS — Z85118 Personal history of other malignant neoplasm of bronchus and lung: Secondary | ICD-10-CM | POA: Diagnosis not present

## 2019-08-01 DIAGNOSIS — C3490 Malignant neoplasm of unspecified part of unspecified bronchus or lung: Secondary | ICD-10-CM | POA: Diagnosis not present

## 2019-08-01 DIAGNOSIS — C342 Malignant neoplasm of middle lobe, bronchus or lung: Secondary | ICD-10-CM

## 2019-08-01 NOTE — Progress Notes (Signed)
[facility] [phone]   [phone]  OFFICE PROGRESS NOTE  [name], MD [place] [address]  DIAGNOSIS: Metastatic nonsmall cell lung cancer, adenocarcinoma, diagnosed in May 2010.   PRIOR THERAPY:  1. Status post 3 cycles of neoadjuvant chemotherapy with carboplatin and Alimta; last dose was given January 10, 2009, with complete resolution of the left upper lobe mass as well as the subcarinal lymphadenopathy and partial response in the right middle lobe lung lesion. 2. Status post right middle lobectomy with lymph node dissection under the care of Dr. 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  CURRENT THERAPY: Observation.  INTERVAL HISTORY: Mathew Hall 79 y.o. male returns to the clinic today for annual follow-up visit.  The patient is feeling fine today with no concerning complaints.  He denied having any chest pain, shortness of breath, cough or hemoptysis.  He has no nausea, vomiting, diarrhea or constipation.  He has no headache or visual changes.  He has no recent weight loss or night sweats.  The patient is here today for evaluation and repeat CT scan of the chest.  MEDICAL HISTORY: Past Medical History:  Diagnosis Date  . Asthma   . CAD (coronary artery disease) 1999   stents to RCA, residual 40% Lt main, decreased EF40%   . Cataract    bilateral-beginning stages  . Colon polyps   . GERD (gastroesophageal reflux disease)   . H/O cardiovascular stress test 1999/2005/2008/2011   last with inf scar/infarct, EF 54%  . H/O echocardiogram 2008   EF 50-55%, Mild MR, Mild TR,    . Hemorrhoids   . 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 Hyperlipemia   . Hypertension   . lung ca dx'd 10/2008   chemo comp 01/2009-stage 4 lung cancer  . Myocardial  infarction ([place]) 02-18-1998  . Peptic stricture of esophagus   . Right carotid bruit 04/01/12   minimal carotid stenosis on doppler    ALLERGIES:  is allergic to lipitor [atorvastatin calcium] and atorvastatin.  MEDICATIONS:  Current Outpatient Medications  Medication Sig Dispense Refill  . amLODipine (NORVASC) 5 MG tablet Take 1 tablet by mouth daily.    Marland Kitchen aspirin 325 MG EC tablet Take 325 mg by mouth daily.      . ergocalciferol (VITAMIN D2) 1.25 MG (50000 UT) capsule Take 50,000 Units by mouth once a week.    . esomeprazole (NEXIUM) 20 MG capsule Take 20 mg by mouth daily at 12 noon.    . metoprolol tartrate (LOPRESSOR) 25 MG tablet Take 25 mg by mouth 2 (two) times daily.    . nitroGLYCERIN (NITROSTAT) 0.4 MG SL tablet Place 1 tablet (0.4 mg total) under the tongue every 5 (five) minutes as needed for chest pain. 90 tablet 3  . oxybutynin (DITROPAN) 5 MG tablet TAKE 1 TABLET BY MOUTH EVERY 8 HOURS AS NEEDED FOR BLADDER SPASMS  2  . simvastatin (ZOCOR) 40 MG tablet TAKE 1 TABLET BY MOUTH EVERY DAY IN THE EVENING 90 tablet 1   Current Facility-Administered Medications  Medication Dose Route Frequency Provider Last Rate Last Admin  . 0.9 %  sodium chloride infusion  500 mL Intravenous Once [name], MD        SURGICAL HISTORY:  Past Surgical History:  Procedure Laterality Date  . ANGIOPLASTY  with 2 stent placement  . COLONOSCOPY    . CORONARY ANGIOPLASTY WITH STENT PLACEMENT  02/18/1998   PCI/stents to ostial and mid RCA, emergently, residual Lt main disease  . LUNG SURGERY     right  . POLYPECTOMY     HPP 09-21-08    REVIEW OF SYSTEMS:  A comprehensive review of systems was negative.   PHYSICAL EXAMINATION: General appearance: alert, cooperative and no distress Head: Normocephalic, without obvious abnormality, atraumatic Neck: no adenopathy Lymph nodes: Cervical, supraclavicular, and axillary nodes normal. Resp: clear to auscultation bilaterally Back: symmetric,  no curvature. ROM normal. No CVA tenderness. Cardio: regular rate and rhythm, S1, S2 normal, no murmur, click, rub or gallop GI: soft, non-tender; bowel sounds normal; no masses,  no organomegaly Extremities: extremities normal, atraumatic, no cyanosis or edema  ECOG PERFORMANCE STATUS: 0 - Asymptomatic  Blood pressure (!) 152/79, pulse (!) 51, temperature 98 F (36.7 C), temperature source Temporal, resp. rate 17, height 5\' 10"  (1.778 m), weight 174 lb 11.2 oz (79.2 kg), SpO2 99 %.  LABORATORY DATA: Lab Results  Component Value Date   WBC 4.6 07/29/2019   HGB 13.9 07/29/2019   HCT 42.4 07/29/2019   MCV 88.3 07/29/2019   PLT 198 07/29/2019      Chemistry      Component Value Date/Time   NA 142 07/29/2019 1139   NA 141 01/16/2017 0753   K 4.0 07/29/2019 1139   K 4.2 01/16/2017 0753   CL 106 07/29/2019 1139   CL 105 07/13/2012 0848   CO2 26 07/29/2019 1139   CO2 27 01/16/2017 0753   BUN 15 07/29/2019 1139   BUN 16.6 01/16/2017 0753   CREATININE 1.15 07/29/2019 1139   CREATININE 1.1 01/16/2017 0753      Component Value Date/Time   CALCIUM 9.5 07/29/2019 1139   CALCIUM 9.9 01/16/2017 0753   ALKPHOS 87 07/29/2019 1139   ALKPHOS 95 01/16/2017 0753   AST 14 (L) 07/29/2019 1139   AST 15 01/16/2017 0753   ALT 8 07/29/2019 1139   ALT 11 01/16/2017 0753   BILITOT 0.9 07/29/2019 1139   BILITOT 0.75 01/16/2017 0753       RADIOGRAPHIC STUDIES: CT Chest W Contrast  Result Date: 07/29/2019 CLINICAL DATA:  Bilateral lung cancer. Status post right middle lobectomy EXAM: CT CHEST WITH CONTRAST TECHNIQUE: Multidetector CT imaging of the chest was performed during intravenous contrast administration. CONTRAST:  53mL OMNIPAQUE IOHEXOL 300 MG/ML  SOLN COMPARISON:  07/30/2018 FINDINGS: Cardiovascular: The heart size is normal. No substantial pericardial effusion. Coronary artery calcification is evident. Atherosclerotic calcification is noted in the wall of the thoracic aorta.  Mediastinum/Nodes: No mediastinal lymphadenopathy. There is no hilar lymphadenopathy. The esophagus has normal imaging features. Tiny hiatal hernia. There is no axillary lymphadenopathy. Lungs/Pleura: Volume loss right hemithorax compatible with reported history of right middle lobectomy. Centrilobular emphsyema noted. 4 mm right upper lobe nodule on 34/5 is stable. The ill-defined clustered ground-glass nodularity noted posterior right upper lobe on the previous study has nearly resolved. The peripheral tree-in-bud opacity seen posterior right lower lobe previously has also essentially resolved in the interval. Interstitial prominence at the right base is similar. Additional scattered very tiny calcified and noncalcified pulmonary nodules are similar to prior. No new suspicious pulmonary nodule or mass in either lung. No pleural effusion. Upper Abdomen: Small calcified gallstone again noted. Musculoskeletal: No worrisome lytic or sclerotic osseous abnormality. IMPRESSION: 1. Interval resolution of the clustered ill-defined and ground-glass nodule seen previously in  the right upper and lower lobes. 2. Stable scattered tiny bilateral pulmonary nodules without new or suspicious pulmonary nodule or mass on today's study. 3. Cholelithiasis. 4.  Emphysema (ICD10-J43.9) and Aortic Atherosclerosis (ICD10-170.0) Electronically Signed   By: Misty Stanley M.D.   On: 07/29/2019 13:49   ASSESSMENT AND PLAN: This is a very pleasant 79 years old white male with history of metastatic non-small cell lung cancer status post neoadjuvant chemotherapy followed by right middle lobectomy with lymph node dissection has been observation since August of 2010 with no evidence for disease recurrence.  The patient is currently on observation and he is feeling fine today with no concerning complaints. He had repeat CT scan of the chest performed recently.  I personally and independently reviewed the scans and discussed the results with the  patient today. His scan showed no concerning findings for disease recurrence or metastasis. I recommended for him to continue on observation with repeat CT scan of the chest in 1 year. He was advised to call immediately if he has any concerning symptoms in the interval. All questions were answered. The patient knows to call the clinic with any problems, questions or concerns. We can certainly see the patient much sooner if necessary.  Disclaimer: This note was dictated with voice recognition software. Similar sounding words can inadvertently be transcribed and may be missed upon review.

## 2019-08-07 ENCOUNTER — Other Ambulatory Visit: Payer: Self-pay | Admitting: Cardiovascular Disease

## 2019-09-02 ENCOUNTER — Ambulatory Visit: Payer: Medicare Other | Admitting: Cardiovascular Disease

## 2019-09-02 ENCOUNTER — Encounter: Payer: Self-pay | Admitting: Cardiovascular Disease

## 2019-09-02 ENCOUNTER — Other Ambulatory Visit: Payer: Self-pay

## 2019-09-02 DIAGNOSIS — I2111 ST elevation (STEMI) myocardial infarction involving right coronary artery: Secondary | ICD-10-CM | POA: Diagnosis not present

## 2019-09-02 DIAGNOSIS — I10 Essential (primary) hypertension: Secondary | ICD-10-CM | POA: Diagnosis not present

## 2019-09-02 NOTE — Assessment & Plan Note (Signed)
History of CAD status post inferior STEMI treated with PCI and stenting of the ostium the mid RCA by myself 02/18/1998.  He did have a 40% ostial left main and EF of 40% at that time.  Follow-up echo in 2008 showed normal LV systolic function and Myoview performed 11/21/2009 was nonischemic.  He denies chest pain or shortness of breath.

## 2019-09-02 NOTE — Progress Notes (Signed)
09/02/2019 Mathew Hall   1940/10/28  [id number]  Primary Physician Mathew Battles, MD Primary Cardiologist: Mathew Harp MD FACP, [place], [place], [place]  HPI:  Mathew Hall is a 79 y.o.  mildly overweight married Caucasian male father of 2, grandfather of 6 grandchildren who I 09/01/2018.  He is accompanied by [name] who is also a patient of mine.  He has a history of CAD status post acute inferior wall myocardial infarction treated percutaneously with stenting February 18, 1998 with intervention of his ostium and mid dominant RCA. He did have a 40% ostial left main stenosis and an EF of 40%, inferior akinesis at that time. He had a normal abdominal aorta, renal arteries and iliac arteries. Followup echo performed in 2008 showed normal LV systolic function, and his most recent Myoview performed Nov 21, 2009 was normal as well. His other problems include hypertension, hyperlipidemia and GERD. Dr. Philip Hall follows his lipid profilewhich most recently performed 08/23/2018 revealed a total cholesterol of 129, LDL 80 and HDL 34.  Since I saw him a year ago he continues to do well.  He is followed by Dr. Earlie Hall for his lung cancer which he has been free from for many years.  He denies chest pain or shortness of breath.    Current Meds  Medication Sig  . amLODipine (NORVASC) 5 MG tablet Take 1 tablet by mouth daily.  Marland Kitchen aspirin 325 MG EC tablet Take 325 mg by mouth daily.    . ergocalciferol (VITAMIN D2) 1.25 MG (50000 UT) capsule Take 50,000 Units by mouth once a week.  . esomeprazole (NEXIUM) 20 MG capsule Take 20 mg by mouth daily at 12 noon.  . metoprolol tartrate (LOPRESSOR) 25 MG tablet Take 25 mg by mouth 2 (two) times daily.  . nitroGLYCERIN (NITROSTAT) 0.4 MG SL tablet Place 1 tablet (0.4 mg total) under the tongue Mathew Hall 5 (five) minutes as needed for chest pain.  Marland Kitchen oxybutynin (DITROPAN) 5 MG tablet TAKE 1 TABLET BY MOUTH Mathew Hall 8 HOURS AS NEEDED FOR BLADDER SPASMS  .  simvastatin (ZOCOR) 40 MG tablet Take 1 tablet (40 mg total) by mouth daily at 6 PM. Please keep upcoming appt for refills. Thank you   Current Facility-Administered Medications for the 09/02/19 encounter (Office Visit) with Mathew Harp, MD  Medication  . 0.9 %  sodium chloride infusion     Allergies  Allergen Reactions  . Lipitor [Atorvastatin Calcium]     Knee pain   . Atorvastatin Other (See Comments)    Joint/knee pain    Social History   Socioeconomic History  . Marital status: Married    Spouse name: Not on file  . Number of children: 2  . Years of education: Not on file  . Highest education level: Not on file  Occupational History  . Occupation: retired  Tobacco Use  . Smoking status: Former Smoker    Quit date: 07/16/1997    Years since quitting: 22.1  . Smokeless tobacco: Never Used  Substance and Sexual Activity  . Alcohol use: Yes    Comment: occasionally  . Drug use: No  . Sexual activity: Not on file  Other Topics Concern  . Not on file  Social History Narrative  . Not on file   Social Determinants of Health   Financial Resource Strain:   . Difficulty of Paying Living Expenses: Not on file  Food Insecurity:   . Worried About Charity fundraiser in  the Last Year: Not on file  . Ran Out of Food in the Last Year: Not on file  Transportation Needs:   . Lack of Transportation (Medical): Not on file  . Lack of Transportation (Non-Medical): Not on file  Physical Activity:   . Days of Exercise per Week: Not on file  . Minutes of Exercise per Session: Not on file  Stress:   . Feeling of Stress : Not on file  Social Connections:   . Frequency of Communication with Friends and Family: Not on file  . Frequency of Social Gatherings with Friends and Family: Not on file  . Attends Religious Services: Not on file  . Active Member of Clubs or Organizations: Not on file  . Attends Archivist Meetings: Not on file  . Marital Status: Not on file    Intimate Partner Violence:   . Fear of Current or Ex-Partner: Not on file  . Emotionally Abused: Not on file  . Physically Abused: Not on file  . Sexually Abused: Not on file     Review of Systems: General: negative for chills, fever, night sweats or weight changes.  Cardiovascular: negative for chest pain, dyspnea on exertion, edema, orthopnea, palpitations, paroxysmal nocturnal dyspnea or shortness of breath Dermatological: negative for rash Respiratory: negative for cough or wheezing Urologic: negative for hematuria Abdominal: negative for nausea, vomiting, diarrhea, bright red blood per rectum, melena, or hematemesis Neurologic: negative for visual changes, syncope, or dizziness All other systems reviewed and are otherwise negative except as noted above.    Blood pressure (!) 148/69, pulse (!) 51, height 5\' 10"  (1.778 m), weight 174 lb (78.9 kg), SpO2 98 %.  General appearance: alert and no distress Neck: no adenopathy, no carotid bruit, no JVD, supple, symmetrical, trachea midline and thyroid not enlarged, symmetric, no tenderness/mass/nodules Lungs: clear to auscultation bilaterally Heart: regular rate and rhythm, S1, S2 normal, no murmur, click, rub or gallop Extremities: extremities normal, atraumatic, no cyanosis or edema Pulses: 2+ and symmetric Skin: Skin color, texture, turgor normal. No rashes or lesions Neurologic: Alert and oriented X 3, normal strength and tone. Normal symmetric reflexes. Normal coordination and gait  EKG sinus bradycardia 51 with right bundle branch block.  I personally reviewed this EKG.  ASSESSMENT AND PLAN:   HYPERLIPIDEMIA History of hyperlipidemia on statin therapy with lipid profile performed 08/23/2018 revealing total cholesterol 129, LDL of 80 and HDL of 34.  Essential hypertension History of essential hypertension with blood pressure measured today at 148/69.  He is on amlodipine and metoprolol.  Acute myocardial infarction History  of CAD status post inferior STEMI treated with PCI and stenting of the ostium the mid RCA by myself 02/18/1998.  He did have a 40% ostial left main and EF of 40% at that time.  Follow-up echo in 2008 showed normal LV systolic function and Myoview performed 11/21/2009 was nonischemic.  He denies chest pain or shortness of breath.      Mathew Harp MD FACP,FACC,FAHA, [facility] 09/02/2019 10:21 AM

## 2019-09-02 NOTE — Assessment & Plan Note (Signed)
History of essential hypertension with blood pressure measured today at 148/69.  He is on amlodipine and metoprolol.

## 2019-09-02 NOTE — Assessment & Plan Note (Signed)
History of hyperlipidemia on statin therapy with lipid profile performed 08/23/2018 revealing total cholesterol 129, LDL of 80 and HDL of 34.

## 2019-09-02 NOTE — Patient Instructions (Signed)
Medication Instructions:  Your physician recommends that you continue on your current medications as directed. Please refer to the Current Medication list given to you today.  If you need a refill on your cardiac medications before your next appointment, please call your pharmacy.   Lab work: NONE  Testing/Procedures: NONE  Follow-Up: At [facility], you and your health needs are our priority.  As part of our continuing mission to provide you with exceptional heart care, we have created designated Provider Care Teams.  These Care Teams include your primary Cardiologist (physician) and Advanced Practice Providers (APPs -  Physician Assistants and Nurse Practitioners) who all work together to provide you with the care you need, when you need it. You may see Dr. Berry or one of the following Advanced Practice Providers on your designated Care Team:    Luke Kilroy, PA-C  Callie Goodrich, PA-C  Jesse Cleaver, FNP  Your physician wants you to follow-up in: 1 year with Dr. Berry. You will receive a reminder letter in the mail two months in advance. If you don't receive a letter, please call our office to schedule the follow-up appointment.      

## 2019-10-30 ENCOUNTER — Other Ambulatory Visit: Payer: Self-pay | Admitting: Cardiovascular Disease

## 2019-11-01 NOTE — Telephone Encounter (Signed)
Rx(s) sent to pharmacy electronically.  

## 2020-03-21 ENCOUNTER — Emergency Department (HOSPITAL_COMMUNITY): Payer: Medicare Other

## 2020-03-21 ENCOUNTER — Inpatient Hospital Stay (HOSPITAL_COMMUNITY)
Admission: EM | Admit: 2020-03-21 | Discharge: 2020-03-31 | DRG: 177 | Disposition: A | Discharge status: Home / Self Care | Payer: Medicare Other | Attending: Internal Medicine | Admitting: Internal Medicine

## 2020-03-21 ENCOUNTER — Encounter (HOSPITAL_COMMUNITY): Payer: Self-pay

## 2020-03-21 DIAGNOSIS — Z902 Acquired absence of lung [part of]: Secondary | ICD-10-CM

## 2020-03-21 DIAGNOSIS — H269 Unspecified cataract: Secondary | ICD-10-CM | POA: Diagnosis present

## 2020-03-21 DIAGNOSIS — Z9689 Presence of other specified functional implants: Secondary | ICD-10-CM

## 2020-03-21 DIAGNOSIS — Z79899 Other long term (current) drug therapy: Secondary | ICD-10-CM

## 2020-03-21 DIAGNOSIS — Z8719 Personal history of other diseases of the digestive system: Secondary | ICD-10-CM

## 2020-03-21 DIAGNOSIS — J869 Pyothorax without fistula: Secondary | ICD-10-CM

## 2020-03-21 DIAGNOSIS — J918 Pleural effusion in other conditions classified elsewhere: Secondary | ICD-10-CM | POA: Diagnosis present

## 2020-03-21 DIAGNOSIS — Z85118 Personal history of other malignant neoplasm of bronchus and lung: Secondary | ICD-10-CM

## 2020-03-21 DIAGNOSIS — K4020 Bilateral inguinal hernia, without obstruction or gangrene, not specified as recurrent: Secondary | ICD-10-CM | POA: Diagnosis present

## 2020-03-21 DIAGNOSIS — E860 Dehydration: Secondary | ICD-10-CM | POA: Diagnosis present

## 2020-03-21 DIAGNOSIS — J90 Pleural effusion, not elsewhere classified: Secondary | ICD-10-CM

## 2020-03-21 DIAGNOSIS — Z955 Presence of coronary angioplasty implant and graft: Secondary | ICD-10-CM

## 2020-03-21 DIAGNOSIS — I10 Essential (primary) hypertension: Secondary | ICD-10-CM | POA: Diagnosis present

## 2020-03-21 DIAGNOSIS — Z823 Family history of stroke: Secondary | ICD-10-CM

## 2020-03-21 DIAGNOSIS — K219 Gastro-esophageal reflux disease without esophagitis: Secondary | ICD-10-CM | POA: Diagnosis present

## 2020-03-21 DIAGNOSIS — C3490 Malignant neoplasm of unspecified part of unspecified bronchus or lung: Secondary | ICD-10-CM

## 2020-03-21 DIAGNOSIS — Z9221 Personal history of antineoplastic chemotherapy: Secondary | ICD-10-CM

## 2020-03-21 DIAGNOSIS — R609 Edema, unspecified: Secondary | ICD-10-CM | POA: Diagnosis present

## 2020-03-21 DIAGNOSIS — E876 Hypokalemia: Secondary | ICD-10-CM | POA: Diagnosis present

## 2020-03-21 DIAGNOSIS — R000 Tachycardia, unspecified: Secondary | ICD-10-CM | POA: Diagnosis present

## 2020-03-21 DIAGNOSIS — J690 Pneumonitis due to inhalation of food and vomit: Secondary | ICD-10-CM | POA: Diagnosis present

## 2020-03-21 DIAGNOSIS — Z888 Allergy status to other drugs, medicaments and biological substances status: Secondary | ICD-10-CM

## 2020-03-21 DIAGNOSIS — Z0184 Encounter for antibody response examination: Secondary | ICD-10-CM

## 2020-03-21 DIAGNOSIS — C7801 Secondary malignant neoplasm of right lung: Secondary | ICD-10-CM | POA: Diagnosis present

## 2020-03-21 DIAGNOSIS — J432 Centrilobular emphysema: Secondary | ICD-10-CM | POA: Diagnosis present

## 2020-03-21 DIAGNOSIS — Z9889 Other specified postprocedural states: Secondary | ICD-10-CM

## 2020-03-21 DIAGNOSIS — Z8616 Personal history of COVID-19: Secondary | ICD-10-CM | POA: Diagnosis present

## 2020-03-21 DIAGNOSIS — Z87891 Personal history of nicotine dependence: Secondary | ICD-10-CM

## 2020-03-21 DIAGNOSIS — U071 COVID-19: Secondary | ICD-10-CM | POA: Diagnosis not present

## 2020-03-21 DIAGNOSIS — I2510 Atherosclerotic heart disease of native coronary artery without angina pectoris: Secondary | ICD-10-CM | POA: Diagnosis present

## 2020-03-21 DIAGNOSIS — Z7982 Long term (current) use of aspirin: Secondary | ICD-10-CM

## 2020-03-21 DIAGNOSIS — E785 Hyperlipidemia, unspecified: Secondary | ICD-10-CM | POA: Diagnosis present

## 2020-03-21 DIAGNOSIS — I252 Old myocardial infarction: Secondary | ICD-10-CM

## 2020-03-21 DIAGNOSIS — J9811 Atelectasis: Secondary | ICD-10-CM | POA: Diagnosis present

## 2020-03-21 DIAGNOSIS — M549 Dorsalgia, unspecified: Secondary | ICD-10-CM | POA: Diagnosis present

## 2020-03-21 DIAGNOSIS — E861 Hypovolemia: Secondary | ICD-10-CM | POA: Diagnosis present

## 2020-03-21 DIAGNOSIS — K649 Unspecified hemorrhoids: Secondary | ICD-10-CM | POA: Diagnosis present

## 2020-03-21 DIAGNOSIS — R05 Cough: Secondary | ICD-10-CM | POA: Diagnosis present

## 2020-03-21 DIAGNOSIS — N400 Enlarged prostate without lower urinary tract symptoms: Secondary | ICD-10-CM | POA: Diagnosis present

## 2020-03-21 DIAGNOSIS — Z23 Encounter for immunization: Secondary | ICD-10-CM

## 2020-03-21 DIAGNOSIS — B954 Other streptococcus as the cause of diseases classified elsewhere: Secondary | ICD-10-CM | POA: Diagnosis present

## 2020-03-21 LAB — COMPREHENSIVE METABOLIC PANEL
ALT: 16 U/L (ref 0–44)
AST: 19 U/L (ref 15–41)
Albumin: 3.4 g/dL — ABNORMAL LOW (ref 3.5–5.0)
Alkaline Phosphatase: 63 U/L (ref 38–126)
Anion gap: 12 (ref 5–15)
BUN: 21 mg/dL (ref 8–23)
CO2: 19 mmol/L — ABNORMAL LOW (ref 22–32)
Calcium: 9.2 mg/dL (ref 8.9–10.3)
Chloride: 103 mmol/L (ref 98–111)
Creatinine, Ser: 1.24 mg/dL (ref 0.61–1.24)
GFR calc Af Amer: >60 mL/min (ref >60)
GFR calc non Af Amer: 55 mL/min — ABNORMAL LOW (ref >60)
Glucose, Bld: 134 mg/dL — ABNORMAL HIGH (ref 70–99)
Potassium: 3 mmol/L — ABNORMAL LOW (ref 3.5–5.1)
Sodium: 134 mmol/L — ABNORMAL LOW (ref 135–145)
Total Bilirubin: 1.3 mg/dL — ABNORMAL HIGH (ref 0.3–1.2)
Total Protein: 7.9 g/dL (ref 6.5–8.1)

## 2020-03-21 LAB — CBC
HCT: 37.6 % — ABNORMAL LOW (ref 39.0–52.0)
Hemoglobin: 12 g/dL — ABNORMAL LOW (ref 13.0–17.0)
MCH: 28.1 pg (ref 26.0–34.0)
MCHC: 31.9 g/dL (ref 30.0–36.0)
MCV: 88.1 fL (ref 80.0–100.0)
Platelets: 251 10*3/uL (ref 150–400)
RBC: 4.27 MIL/uL (ref 4.22–5.81)
RDW: 13.4 % (ref 11.5–15.5)
WBC: 7.6 10*3/uL (ref 4.0–10.5)
nRBC: 0 % (ref 0.0–0.2)

## 2020-03-21 LAB — LIPASE, BLOOD: Lipase: 27 U/L (ref 11–51)

## 2020-03-21 NOTE — ED Triage Notes (Signed)
Pt comes via [facility] for lower back pain, pt reports that this happens after he mows his lawn. Pain radaites to his abdomen, denies n/v/d. PTA given tylenol with some relief of pain. Pt has also had a dry cough

## 2020-03-22 ENCOUNTER — Emergency Department (HOSPITAL_COMMUNITY): Payer: Medicare Other

## 2020-03-22 ENCOUNTER — Other Ambulatory Visit: Payer: Self-pay

## 2020-03-22 DIAGNOSIS — M549 Dorsalgia, unspecified: Secondary | ICD-10-CM | POA: Diagnosis present

## 2020-03-22 DIAGNOSIS — K649 Unspecified hemorrhoids: Secondary | ICD-10-CM | POA: Diagnosis present

## 2020-03-22 DIAGNOSIS — R000 Tachycardia, unspecified: Secondary | ICD-10-CM | POA: Diagnosis present

## 2020-03-22 DIAGNOSIS — J9811 Atelectasis: Secondary | ICD-10-CM | POA: Diagnosis present

## 2020-03-22 DIAGNOSIS — Z9689 Presence of other specified functional implants: Secondary | ICD-10-CM | POA: Diagnosis not present

## 2020-03-22 DIAGNOSIS — R05 Cough: Secondary | ICD-10-CM | POA: Diagnosis present

## 2020-03-22 DIAGNOSIS — J90 Pleural effusion, not elsewhere classified: Secondary | ICD-10-CM | POA: Diagnosis not present

## 2020-03-22 DIAGNOSIS — I252 Old myocardial infarction: Secondary | ICD-10-CM | POA: Diagnosis not present

## 2020-03-22 DIAGNOSIS — K219 Gastro-esophageal reflux disease without esophagitis: Secondary | ICD-10-CM | POA: Diagnosis present

## 2020-03-22 DIAGNOSIS — Z9221 Personal history of antineoplastic chemotherapy: Secondary | ICD-10-CM | POA: Diagnosis not present

## 2020-03-22 DIAGNOSIS — Z823 Family history of stroke: Secondary | ICD-10-CM | POA: Diagnosis not present

## 2020-03-22 DIAGNOSIS — Z23 Encounter for immunization: Secondary | ICD-10-CM | POA: Diagnosis present

## 2020-03-22 DIAGNOSIS — Z8719 Personal history of other diseases of the digestive system: Secondary | ICD-10-CM | POA: Diagnosis not present

## 2020-03-22 DIAGNOSIS — Z8616 Personal history of COVID-19: Secondary | ICD-10-CM | POA: Diagnosis present

## 2020-03-22 DIAGNOSIS — K4020 Bilateral inguinal hernia, without obstruction or gangrene, not specified as recurrent: Secondary | ICD-10-CM | POA: Diagnosis not present

## 2020-03-22 DIAGNOSIS — I10 Essential (primary) hypertension: Secondary | ICD-10-CM | POA: Diagnosis present

## 2020-03-22 DIAGNOSIS — C7801 Secondary malignant neoplasm of right lung: Secondary | ICD-10-CM | POA: Diagnosis present

## 2020-03-22 DIAGNOSIS — U071 COVID-19: Secondary | ICD-10-CM | POA: Diagnosis present

## 2020-03-22 DIAGNOSIS — Z955 Presence of coronary angioplasty implant and graft: Secondary | ICD-10-CM | POA: Diagnosis not present

## 2020-03-22 DIAGNOSIS — Z902 Acquired absence of lung [part of]: Secondary | ICD-10-CM | POA: Diagnosis not present

## 2020-03-22 DIAGNOSIS — J869 Pyothorax without fistula: Secondary | ICD-10-CM | POA: Diagnosis not present

## 2020-03-22 DIAGNOSIS — R7989 Other specified abnormal findings of blood chemistry: Secondary | ICD-10-CM | POA: Diagnosis not present

## 2020-03-22 DIAGNOSIS — I2510 Atherosclerotic heart disease of native coronary artery without angina pectoris: Secondary | ICD-10-CM | POA: Diagnosis present

## 2020-03-22 DIAGNOSIS — M7989 Other specified soft tissue disorders: Secondary | ICD-10-CM | POA: Diagnosis not present

## 2020-03-22 DIAGNOSIS — E785 Hyperlipidemia, unspecified: Secondary | ICD-10-CM | POA: Diagnosis present

## 2020-03-22 DIAGNOSIS — Z888 Allergy status to other drugs, medicaments and biological substances status: Secondary | ICD-10-CM | POA: Diagnosis not present

## 2020-03-22 DIAGNOSIS — Z85118 Personal history of other malignant neoplasm of bronchus and lung: Secondary | ICD-10-CM | POA: Diagnosis not present

## 2020-03-22 DIAGNOSIS — J690 Pneumonitis due to inhalation of food and vomit: Secondary | ICD-10-CM | POA: Diagnosis present

## 2020-03-22 DIAGNOSIS — Z7982 Long term (current) use of aspirin: Secondary | ICD-10-CM | POA: Diagnosis not present

## 2020-03-22 DIAGNOSIS — J918 Pleural effusion in other conditions classified elsewhere: Secondary | ICD-10-CM | POA: Diagnosis present

## 2020-03-22 DIAGNOSIS — Z87891 Personal history of nicotine dependence: Secondary | ICD-10-CM | POA: Diagnosis not present

## 2020-03-22 LAB — D-DIMER, QUANTITATIVE: D-Dimer, Quant: 2.76 ug/mL-FEU — ABNORMAL HIGH (ref 0.00–0.50)

## 2020-03-22 LAB — URINALYSIS, ROUTINE W REFLEX MICROSCOPIC
Bacteria, UA: NONE SEEN
Bilirubin Urine: NEGATIVE
Glucose, UA: NEGATIVE mg/dL
Ketones, ur: NEGATIVE mg/dL
Leukocytes,Ua: NEGATIVE
Nitrite: NEGATIVE
Protein, ur: 30 mg/dL — AB
Specific Gravity, Urine: >1.046 — ABNORMAL HIGH (ref 1.005–1.030)
pH: 5 (ref 5.0–8.0)

## 2020-03-22 LAB — TROPONIN I (HIGH SENSITIVITY): Troponin I (High Sensitivity): 13 ng/L (ref <18)

## 2020-03-22 LAB — SARS CORONAVIRUS 2 BY RT PCR (HOSPITAL ORDER, PERFORMED IN ~~LOC~~ HOSPITAL LAB): SARS Coronavirus 2: POSITIVE — AB

## 2020-03-22 LAB — FERRITIN: Ferritin: 293 ng/mL (ref 24–336)

## 2020-03-22 LAB — LACTIC ACID, PLASMA: Lactic Acid, Venous: 1.2 mmol/L (ref 0.5–1.9)

## 2020-03-22 MED ORDER — ENOXAPARIN SODIUM 40 MG/0.4ML ~~LOC~~ SOLN
40.0000 mg | SUBCUTANEOUS | Status: DC
  Administered 2020-03-22: 40 mg via SUBCUTANEOUS
  Filled 2020-03-22: qty 0.4

## 2020-03-22 MED ORDER — SODIUM CHLORIDE 0.9 % IV SOLN
100.0000 mg | Freq: Every day | INTRAVENOUS | Status: AC
  Administered 2020-03-23 – 2020-03-26 (×4): 100 mg via INTRAVENOUS
  Filled 2020-03-22 (×5): qty 20

## 2020-03-22 MED ORDER — PANTOPRAZOLE SODIUM 40 MG PO TBEC
40.0000 mg | DELAYED_RELEASE_TABLET | Freq: Every day | ORAL | Status: DC
  Administered 2020-03-23 – 2020-03-31 (×9): 40 mg via ORAL
  Filled 2020-03-22 (×9): qty 1

## 2020-03-22 MED ORDER — OXYBUTYNIN CHLORIDE 5 MG PO TABS
5.0000 mg | ORAL_TABLET | Freq: Three times a day (TID) | ORAL | Status: DC | PRN
  Administered 2020-03-23 – 2020-03-26 (×3): 5 mg via ORAL
  Filled 2020-03-22 (×4): qty 1

## 2020-03-22 MED ORDER — POTASSIUM CHLORIDE CRYS ER 20 MEQ PO TBCR
40.0000 meq | EXTENDED_RELEASE_TABLET | Freq: Once | ORAL | Status: AC
  Administered 2020-03-22: 40 meq via ORAL
  Filled 2020-03-22: qty 2

## 2020-03-22 MED ORDER — ONDANSETRON HCL 4 MG/2ML IJ SOLN: 4.0000 mg | Freq: Four times a day (QID) | INTRAMUSCULAR | Status: DC | PRN

## 2020-03-22 MED ORDER — IOHEXOL 350 MG/ML SOLN
100.0000 mL | Freq: Once | INTRAVENOUS | Status: AC | PRN
  Administered 2020-03-22: 100 mL via INTRAVENOUS

## 2020-03-22 MED ORDER — SIMVASTATIN 20 MG PO TABS
40.0000 mg | ORAL_TABLET | Freq: Every day | ORAL | Status: DC
  Administered 2020-03-23 – 2020-03-31 (×9): 40 mg via ORAL
  Filled 2020-03-22 (×9): qty 2

## 2020-03-22 MED ORDER — SODIUM CHLORIDE 0.9 % IV SOLN
200.0000 mg | Freq: Once | INTRAVENOUS | Status: AC
  Administered 2020-03-22: 200 mg via INTRAVENOUS
  Filled 2020-03-22: qty 40

## 2020-03-22 MED ORDER — SODIUM CHLORIDE 0.9 % IV SOLN: INTRAVENOUS | Status: AC

## 2020-03-22 MED ORDER — IPRATROPIUM BROMIDE HFA 17 MCG/ACT IN AERS
2.0000 | INHALATION_SPRAY | Freq: Four times a day (QID) | RESPIRATORY_TRACT | Status: DC
  Administered 2020-03-22 – 2020-03-31 (×31): 2 via RESPIRATORY_TRACT
  Filled 2020-03-22 (×2): qty 12.9

## 2020-03-22 MED ORDER — AMLODIPINE BESYLATE 5 MG PO TABS
5.0000 mg | ORAL_TABLET | Freq: Every day | ORAL | Status: DC
  Administered 2020-03-23 – 2020-03-31 (×9): 5 mg via ORAL
  Filled 2020-03-22 (×9): qty 1

## 2020-03-22 MED ORDER — ASPIRIN EC 325 MG PO TBEC
325.0000 mg | DELAYED_RELEASE_TABLET | Freq: Every day | ORAL | Status: DC
  Administered 2020-03-23 – 2020-03-28 (×6): 325 mg via ORAL
  Filled 2020-03-22 (×6): qty 1

## 2020-03-22 MED ORDER — GUAIFENESIN-DM 100-10 MG/5ML PO SYRP: 10.0000 mL | ORAL_SOLUTION | ORAL | Status: DC | PRN

## 2020-03-22 MED ORDER — METOPROLOL TARTRATE 25 MG PO TABS
25.0000 mg | ORAL_TABLET | Freq: Two times a day (BID) | ORAL | Status: DC
  Administered 2020-03-22: 25 mg via ORAL
  Filled 2020-03-22 (×2): qty 1

## 2020-03-22 MED ORDER — ONDANSETRON HCL 4 MG PO TABS: 4.0000 mg | ORAL_TABLET | Freq: Four times a day (QID) | ORAL | Status: DC | PRN

## 2020-03-22 MED ORDER — DEXAMETHASONE 4 MG PO TABS
6.0000 mg | ORAL_TABLET | ORAL | Status: DC
  Administered 2020-03-22: 6 mg via ORAL
  Filled 2020-03-22: qty 2

## 2020-03-22 MED ORDER — ACETAMINOPHEN 325 MG PO TABS: 650.0000 mg | ORAL_TABLET | Freq: Four times a day (QID) | ORAL | Status: DC | PRN

## 2020-03-22 NOTE — ED Notes (Signed)
Mathew Hall- [phone] would like an update

## 2020-03-22 NOTE — ED Notes (Addendum)
Pt ambulated in room on room air. Pt maintained O2 saturations above 95% but became short of breath and had some labored breathing while ambulating. RR mid 30s.

## 2020-03-22 NOTE — ED Provider Notes (Signed)
[facility] EMERGENCY DEPARTMENT Provider Note   CSN: [id number] Arrival date & time: 03/21/20  2156     History Chief Complaint  Patient presents with  . Back Pain    Mathew Hall is a 79 y.o. male.  The history is provided by medical records and the patient. No language interpreter was used.  Abdominal Pain Pain location:  R flank, RUQ and RLQ Pain quality: aching and cramping   Pain radiates to:  Groin, R flank and chest (right chest and back) Pain severity:  Moderate Onset quality:  Gradual Timing:  Intermittent Progression:  Waxing and waning Chronicity:  Recurrent Context: not trauma   Relieved by:  Nothing Worsened by:  Deep breathing Ineffective treatments:  None tried Associated symptoms: chest pain (right lateral chest wall pain) and cough   Associated symptoms: no chills, no constipation, no diarrhea, no fatigue, no fever, no nausea, no shortness of breath and no vomiting        Past Medical History:  Diagnosis Date  . Asthma   . CAD (coronary artery disease) 1999   stents to RCA, residual 40% Lt main, decreased EF40%   . Cataract    bilateral-beginning stages  . Colon polyps   . GERD (gastroesophageal reflux disease)   . H/O cardiovascular stress test 1999/2005/2008/2011   last with inf scar/infarct, EF 54%  . H/O echocardiogram 2008   EF 50-55%, Mild MR, Mild TR,    . Hemorrhoids   . 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 Hyperlipemia   . Hypertension   . lung ca dx'd 10/2008   chemo comp 01/2009-stage 4 lung cancer  . Myocardial infarction ([place]) 02-18-1998  . Peptic stricture of esophagus   . Right carotid bruit 04/01/12   minimal carotid stenosis on doppler    Patient Active Problem List   Diagnosis Date Noted  . CAD (coronary artery disease), emergent stent to ostial RCA and mid RCA with an acute inferior MI in 1999, residual left main disease 40% at that time, last week's study 2011 with inferior  scar no isc   . 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 C O P D 10/27/2008  . HYPERLIPIDEMIA 10/10/2008  . Essential hypertension 10/10/2008  . Acute myocardial infarction ([place]) 10/10/2008  . PERSONAL HX COLONIC POLYPS 09/05/2008  . 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 HEMORRHOIDS, INTERNAL 09/04/2008  . ESOPHAGEAL STRICTURE 09/04/2008  . GERD 09/04/2008    Past Surgical History:  Procedure Laterality Date  . ANGIOPLASTY     with 2 stent placement  . COLONOSCOPY    . CORONARY ANGIOPLASTY WITH STENT PLACEMENT  02/18/1998   PCI/stents to ostial and mid RCA, emergently, residual Lt main disease  . LUNG SURGERY     right  . POLYPECTOMY     HPP 09-21-08       Family History  Problem Relation Age of Onset  . Healthy Sister   . Healthy Sister   . Stroke Mother   . Colon cancer Neg Hx   . Rectal cancer Neg Hx   . Stomach cancer Neg Hx     Social History   Tobacco Use  . Smoking status: Former Smoker    Quit date: 07/16/1997    Years since quitting: 22.6  . Smokeless tobacco: Never Used  Substance Use Topics  . Alcohol use: Yes    Comment: occasionally  . Drug use: No    Home  Medications Prior to Admission medications   Medication Sig Start Date End Date Taking? Authorizing Provider  amLODipine (NORVASC) 5 MG tablet Take 1 tablet by mouth daily. 08/30/18   [provider]  aspirin 325 MG EC tablet Take 325 mg by mouth daily.      [provider]  ergocalciferol (VITAMIN D2) 1.25 MG (50000 UT) capsule Take 50,000 Units by mouth once a week.    [provider]  esomeprazole (NEXIUM) 20 MG capsule Take 20 mg by mouth daily at 12 noon.    [provider]  metoprolol tartrate (LOPRESSOR) 25 MG tablet Take 25 mg by mouth 2 (two) times daily.    [provider]  nitroGLYCERIN (NITROSTAT) 0.4 MG SL tablet Place 1 tablet (0.4 mg total) under the tongue every 5 (five) minutes as needed for chest pain. 09/01/17    Lorretta Harp, MD  oxybutynin (DITROPAN) 5 MG tablet TAKE 1 TABLET BY MOUTH EVERY 8 HOURS AS NEEDED FOR BLADDER SPASMS 01/02/15   [provider]  simvastatin (ZOCOR) 40 MG tablet Take 1 tablet (40 mg total) by mouth daily at 6 PM. 11/01/19   Lorretta Harp, MD    Allergies    Lipitor [atorvastatin calcium] and Atorvastatin  Review of Systems   Review of Systems  Constitutional: Negative for chills, diaphoresis, fatigue and fever.  HENT: Negative for congestion.   Respiratory: Positive for cough. Negative for chest tightness, shortness of breath, wheezing and stridor.   Cardiovascular: Positive for chest pain (right lateral chest wall pain). Negative for palpitations.  Gastrointestinal: Positive for abdominal pain. Negative for constipation, diarrhea, nausea and vomiting.  Genitourinary: Positive for flank pain and scrotal swelling. Negative for decreased urine volume, frequency, penile pain, penile swelling and testicular pain.  Musculoskeletal: Positive for back pain. Negative for neck pain and neck stiffness.  Skin: Negative for wound.  Neurological: Negative for headaches.  Psychiatric/Behavioral: Negative for agitation.  All other systems reviewed and are negative.   Physical Exam Updated Vital Signs BP 124/68 (BP Location: Right Arm)   Pulse 71   Temp 97.6 F (36.4 C) (Oral)   Resp 16   SpO2 99%   Physical Exam Vitals and nursing note reviewed. Exam conducted with a chaperone present.  Constitutional:      General: He is not in acute distress.    Appearance: He is well-developed. He is not ill-appearing, toxic-appearing or diaphoretic.  HENT:     Head: Normocephalic and atraumatic.     Nose: Nose normal. No congestion or rhinorrhea.     Mouth/Throat:     Mouth: Mucous membranes are moist.     Pharynx: No oropharyngeal exudate or posterior oropharyngeal erythema.  Eyes:     Conjunctiva/sclera: Conjunctivae normal.  Cardiovascular:     Rate and Rhythm:  Normal rate and regular rhythm.     Pulses: Normal pulses.     Heart sounds: No murmur heard.   Pulmonary:     Effort: Pulmonary effort is normal. No respiratory distress.     Breath sounds: Rhonchi present. No wheezing or rales.  Chest:     Chest wall: No tenderness.  Abdominal:     General: Abdomen is flat.     Palpations: Abdomen is soft.     Tenderness: There is abdominal tenderness (mild). There is no right CVA tenderness, left CVA tenderness, guarding or rebound.     Hernia: A hernia (feels like R hernia) is present. Hernia is present in the right inguinal  area. There is no hernia in the left inguinal area.       Comments: Swelling tenderness of right inguinal area concerning for possible hernia.  Also some right flank tenderness and pain in the right lateral chest.  Genitourinary:    Testes:        Right: Tenderness not present.        Left: Tenderness not present.  Musculoskeletal:        General: Tenderness present.     Cervical back: Neck supple. No tenderness.       Back:     Right lower leg: No edema.     Left lower leg: No edema.  Skin:    General: Skin is warm and dry.     Capillary Refill: Capillary refill takes less than 2 seconds.     Findings: No erythema.  Neurological:     General: No focal deficit present.     Mental Status: He is alert.  Psychiatric:        Mood and Affect: Mood normal.     ED Results / Procedures / Treatments   Labs (all labs ordered are listed, but only abnormal results are displayed) Labs Reviewed  SARS CORONAVIRUS 2 BY RT PCR (HOSPITAL ORDER, [facility]) - Abnormal; Notable for the following components:      Result Value   SARS Coronavirus 2 POSITIVE (*)    All other components within normal limits  COMPREHENSIVE METABOLIC PANEL - Abnormal; Notable for the following components:   Sodium 134 (*)    Potassium 3.0 (*)    CO2 19 (*)    Glucose, Bld 134 (*)    Albumin 3.4 (*)    Total Bilirubin 1.3  (*)    GFR calc non Af Amer 55 (*)    All other components within normal limits  CBC - Abnormal; Notable for the following components:   Hemoglobin 12.0 (*)    HCT 37.6 (*)    All other components within normal limits  URINALYSIS, ROUTINE W REFLEX MICROSCOPIC - Abnormal; Notable for the following components:   Specific Gravity, Urine >1.046 (*)    Hgb urine dipstick SMALL (*)    Protein, ur 30 (*)    All other components within normal limits  URINE CULTURE  LIPASE, BLOOD  LACTIC ACID, PLASMA  TROPONIN I (HIGH SENSITIVITY)    EKG EKG Interpretation  Date/Time:  Thursday March 22 2020 13:24:26 EDT Ventricular Rate:  90 PR Interval:    QRS Duration: 139 QT Interval:  382 QTC Calculation: 468 R Axis:   97 Text Interpretation: Sinus rhythm Ventricular bigeminy RBBB and LPFB Inferior infarct, age indeterminate RBBB/LPFB new since 2010 Confirmed by Sherwood Gambler [phone]) on 03/22/2020 2:03:42 PM   Radiology DG Chest 2 View  Result Date: 03/21/2020 CLINICAL DATA:  79 year old male with cough and back pain. Former smoker. Previous right middle lobectomy. EXAM: CHEST - 2 VIEW COMPARISON:  Chest CT 07/29/2019 and earlier. FINDINGS: Right lung volume loss and architectural distortion similar to the January CT. However, with increased streaky and indistinct right lung base opacity since that time. Left lung volume is stable. No superimposed pneumothorax. No definite pleural effusion. No pulmonary edema. Mediastinal contours are stable and within normal limits. Visualized tracheal air column is within normal limits. No acute osseous abnormality identified. Negative visible bowel gas pattern. IMPRESSION: Sequelae of prior right middle lobectomy, but with superimposed new interstitial opacity in the right lower lung. Favor acute viral/atypical respiratory  infection. No definite pleural effusion. Recommend Followup PA and lateral chest X-ray, or alternatively repeat restaging Chest CT, following  therapy to ensure resolution and exclude recurrent malignancy. Electronically Signed   By: Genevie Ann M.D.   On: 03/21/2020 22:26   CT Angio Chest PE W and/or Wo Contrast  Result Date: 03/22/2020 CLINICAL DATA:  Abdominal pain, pleuritic RIGHT chest pain, history lung cancer post resection, possible RIGHT groin hernia, prior abnormal CT chest EXAM: CT ANGIOGRAPHY CHEST CT ABDOMEN AND PELVIS WITH CONTRAST TECHNIQUE: Multidetector CT imaging of the chest was performed using the standard protocol during bolus administration of intravenous contrast. Multiplanar CT image reconstructions and MIPs were obtained to evaluate the vascular anatomy. Multidetector CT imaging of the abdomen and pelvis was performed using the standard protocol during bolus administration of intravenous contrast. CONTRAST:  159mL OMNIPAQUE IOHEXOL 350 MG/ML SOLN IV. No oral contrast. COMPARISON:  CT chest 07/29/2019 FINDINGS: CTA CHEST FINDINGS Cardiovascular: Atherosclerotic calcifications of thoracic aorta and coronary arteries. Aorta normal caliber without aneurysm or dissection. Heart unremarkable. No pericardial effusion. Pulmonary arteries adequately opacified. Scattered respiratory motion artifacts at the lower lobes. No definite pulmonary emboli identified. Mediastinum/Nodes: Esophagus unremarkable. Base of cervical region normal appearance. 11 mm precarinal node slightly increased from previous exam. Additional small normal size mediastinal lymph nodes without additional thoracic adenopathy. Lungs/Pleura: Partially loculated RIGHT pleural effusion. Compressive atelectasis of RIGHT lower lobe. Prior RIGHT middle lobe resection. Loculated fluid in RIGHT major fissure posteriorly. Underlying emphysematous changes. No acute infiltrate, pleural effusion or pneumothorax. Small focus of scarring in the RIGHT upper lobe unchanged image 29. Musculoskeletal: No acute osseous findings. Review of the MIP images confirms the above findings. CT ABDOMEN  and PELVIS FINDINGS Hepatobiliary: Calcified gallstone dependently in gallbladder measuring 5 mm diameter. Gallbladder and liver otherwise normal appearance. Pancreas: Normal appearance Spleen: Normal appearance Adrenals/Urinary Tract: Adrenal glands kidneys, ureters, and bladder normal appearance Stomach/Bowel: Short segment of appendiceal base normal appearance. Stomach and bowel loops normal appearance. Vascular/Lymphatic: Atherosclerotic calcifications of aortic and iliac arteries without aneurysm. No adenopathy. Reproductive: Prostatic enlargement gland measuring 6.5 x 6.5 x 5.5 cm (volume = 120 cm^3) Other: BILATERAL inguinal hernias containing fat larger on RIGHT. No free air or free fluid. No inflammatory process. Tiny umbilical hernia containing fat. Musculoskeletal: Bones demineralized. Degenerative disc disease changes thoracolumbar spine. Review of the MIP images confirms the above findings. IMPRESSION: No evidence of pulmonary embolism. Partially loculated RIGHT pleural effusion with compressive atelectasis of the RIGHT lower lobe. Prior RIGHT middle lobe resection. Single minimally enlarged precarinal lymph node 11 mm short axis. No acute intra-abdominal or intrapelvic abnormalities. Prostatic enlargement. BILATERAL inguinal hernias containing fat larger on RIGHT. Cholelithiasis. Aortic Atherosclerosis (ICD10-I70.0) and Emphysema (ICD10-J43.9). Electronically Signed   By: Lavonia Dana M.D.   On: 03/22/2020 15:03   CT ABDOMEN PELVIS W CONTRAST  Result Date: 03/22/2020 CLINICAL DATA:  Abdominal pain, pleuritic RIGHT chest pain, history lung cancer post resection, possible RIGHT groin hernia, prior abnormal CT chest EXAM: CT ANGIOGRAPHY CHEST CT ABDOMEN AND PELVIS WITH CONTRAST TECHNIQUE: Multidetector CT imaging of the chest was performed using the standard protocol during bolus administration of intravenous contrast. Multiplanar CT image reconstructions and MIPs were obtained to evaluate the vascular  anatomy. Multidetector CT imaging of the abdomen and pelvis was performed using the standard protocol during bolus administration of intravenous contrast. CONTRAST:  114mL OMNIPAQUE IOHEXOL 350 MG/ML SOLN IV. No oral contrast. COMPARISON:  CT chest 07/29/2019 FINDINGS: CTA CHEST FINDINGS Cardiovascular: Atherosclerotic calcifications of  thoracic aorta and coronary arteries. Aorta normal caliber without aneurysm or dissection. Heart unremarkable. No pericardial effusion. Pulmonary arteries adequately opacified. Scattered respiratory motion artifacts at the lower lobes. No definite pulmonary emboli identified. Mediastinum/Nodes: Esophagus unremarkable. Base of cervical region normal appearance. 11 mm precarinal node slightly increased from previous exam. Additional small normal size mediastinal lymph nodes without additional thoracic adenopathy. Lungs/Pleura: Partially loculated RIGHT pleural effusion. Compressive atelectasis of RIGHT lower lobe. Prior RIGHT middle lobe resection. Loculated fluid in RIGHT major fissure posteriorly. Underlying emphysematous changes. No acute infiltrate, pleural effusion or pneumothorax. Small focus of scarring in the RIGHT upper lobe unchanged image 29. Musculoskeletal: No acute osseous findings. Review of the MIP images confirms the above findings. CT ABDOMEN and PELVIS FINDINGS Hepatobiliary: Calcified gallstone dependently in gallbladder measuring 5 mm diameter. Gallbladder and liver otherwise normal appearance. Pancreas: Normal appearance Spleen: Normal appearance Adrenals/Urinary Tract: Adrenal glands kidneys, ureters, and bladder normal appearance Stomach/Bowel: Short segment of appendiceal base normal appearance. Stomach and bowel loops normal appearance. Vascular/Lymphatic: Atherosclerotic calcifications of aortic and iliac arteries without aneurysm. No adenopathy. Reproductive: Prostatic enlargement gland measuring 6.5 x 6.5 x 5.5 cm (volume = 120 cm^3) Other: BILATERAL  inguinal hernias containing fat larger on RIGHT. No free air or free fluid. No inflammatory process. Tiny umbilical hernia containing fat. Musculoskeletal: Bones demineralized. Degenerative disc disease changes thoracolumbar spine. Review of the MIP images confirms the above findings. IMPRESSION: No evidence of pulmonary embolism. Partially loculated RIGHT pleural effusion with compressive atelectasis of the RIGHT lower lobe. Prior RIGHT middle lobe resection. Single minimally enlarged precarinal lymph node 11 mm short axis. No acute intra-abdominal or intrapelvic abnormalities. Prostatic enlargement. BILATERAL inguinal hernias containing fat larger on RIGHT. Cholelithiasis. Aortic Atherosclerosis (ICD10-I70.0) and Emphysema (ICD10-J43.9). Electronically Signed   By: Lavonia Dana M.D.   On: 03/22/2020 15:03    Procedures Procedures (including critical care time)  Medications Ordered in ED Medications  iohexol (OMNIPAQUE) 350 MG/ML injection 100 mL (100 mLs Intravenous Contrast Given 03/22/20 1419)    ED Course  I have reviewed the triage vital signs and the nursing notes.  Pertinent labs & imaging results that were available during my care of the patient were reviewed by me and considered in my medical decision making (see chart for details).    MDM Rules/Calculators/A&P                          Mathew Hall is a 79 y.o. male with a past medical history significant for CAD status post MI and PCI, COPD, GERD, hyperlipidemia, hypertension, primary lung cancer status post right lower lobe resection, asthma, and carotid disease who presents for multiple complaints including cough, right-sided pleuritic chest pain, and pain in his right abdomen radiating towards his right groin with an associated bulge.  Patient says that he has been cancer free for quite some time and is managed by Dr. Earlie Server who did a CT scan in January that was reassuring.  He reports that for the last few weeks he has been  having somewhat of a cough as well as a more recent right-sided chest/back/flank pain.  He reports it is very pleuritic and he cannot take a deep breath without discomfort.  He denies fevers or chills however on arrival his temperature was 100.3.  He is vaccinated for COVID-19 already.  He denies nausea, vomiting, constipation, or diarrhea.  He denies any urinary changes.  He does say that back in July, he  noticed a bulge in his right groin and when he went to get evaluated, he decided that he wanted to see a physician instead of an APP.  He was told he needed to come back in September but he had not been to that appointment.  Patient reports that over the last few days he has been having more discomfort especially after doing yard work with the pain in the right flank, right abdomen going towards the groin, and the right pleuritic discomfort in his torso.  On exam, patient did have decreased breath sounds in the right lower lobe with some rhonchi.  Chest was nontender.  Patient had some very mild tenderness in the right flank and some mild soreness in his right groin.  A chaperone was present and he does have what feels to be a hernia.  The testicle is nontender.  Exam was otherwise unremarkable.  Good pulses in extremities.  Had a shared decision-making conversation with patient about work-up.  He had an x-ray in triage as he has been here for nearly 14 hours prior to my evaluation.  X-ray showed possible pneumonia versus other abnormality in the right lower lobe where he is hurting.  CT was recommended.  We will get a CT PE study given his cancer history and the pleuritic discomfort to rule out PE as well as further investigate for possible pneumonia developing.  We will also get a CT abdomen pelvis to look for possible hernia complications or a hernia.  We will get screening labs and check him for Covid.  Anticipate reassessment after work-up.     CT scan shows concern for loculated pleural effusion on  the right side. This is where the patient is having discomfort, I am concerned that this may be contribute to his discomfort. Patient's abdomen and pelvis CT shows a large right inguinal hernia that is likely causing the bulge and discomfort but there is no evidence of incarceration or bowel involved. Anticipate he can follow-up with general surgery as an outpatient for further management of this problem.  Patient's Covid test came back positive which is concerning given the cough, chest pain, shortness of breath. I try to ambulate the patient and his oxygen remained in the nineties but his respiratory rate went into the forties he was extremely symptomatic and his heart rate was in the one twenties. I am concerned about his respiratory status given the Covid, his known lung disease, and his loculated pleural effusion.  I discussed with the patient and I am concerned about the safety of him going home with these concerning vital signs with any exertion as well as this loculated pleural effusion.  He will be admitted for further management of COVID-19 causing chest pain, shortness of breath, concerning vital signs, and a loculated effusion. Care signed out to Dr. Isla Pence while waiting for admission.    Final Clinical Impression(s) / ED Diagnoses Final diagnoses:  COVID-19  Loculated pleural effusion  Non-recurrent bilateral inguinal hernia without obstruction or gangrene     Clinical Impression: 1. COVID-19   2. Loculated pleural effusion   3. Non-recurrent bilateral inguinal hernia without obstruction or gangrene     Disposition: Admit  This note was prepared with assistance of Dragon voice recognition software. Occasional wrong-word or sound-a-like substitutions may have occurred due to the inherent limitations of voice recognition software.     Antoni Stefan, Gwenyth Allegra, MD 03/23/20 1057

## 2020-03-22 NOTE — ED Provider Notes (Signed)
Pt signed out by Dr. Sherry Ruffing.  Pt d/w Dr. Roosevelt Locks (triad) for admission.   Isla Pence, MD 03/22/20 [phone]

## 2020-03-22 NOTE — H&P (Addendum)
History and Physical    Mathew Hall QVZ:[id number] DOB: Apr 19, 1941 DOA: 03/21/2020  PCP: [name], MD (Confirm with patient/family/NH records and if not entered, this has to be entered at [facility] point of entry) Patient coming from: Home  I have personally briefly reviewed patient's old medical records in [place]  Chief Complaint: Cough, back pain  HPI: Mathew Hall is a 79 y.o. male with medical history significant of lung CA status post partial resection, hypertension, HLD, GERD, CAD with MI remote, presented with worsening back pain and cough.  Patient did eat grass cutting 2 days ago, and he said that usually after doing grass cutting cream we will have back pain for a day or 2.  But this time the back pain did not go away after day 2, and he also developed a dry cough and exertional shortness of breath.  Denies any chest pains, no fever or chills.  No loss of taste or diarrhea. ED Course: COVID-19 positive, CT angiogram negative for PE but some loculated right sided pleural effusion.  Potassium 3.0, creatinine 1.2.  Patient was found to be very tachypneic and symptomatic when talking and minimal exertion.  Tachycardia.  Review of Systems: As per HPI otherwise 14 point review of systems negative.    Past Medical History:  Diagnosis Date  . Asthma   . CAD (coronary artery disease) 1999   stents to RCA, residual 40% Lt main, decreased EF40%   . Cataract    bilateral-beginning stages  . Colon polyps   . GERD (gastroesophageal reflux disease)   . H/O cardiovascular stress test 1999/2005/2008/2011   last with inf scar/infarct, EF 54%  . H/O echocardiogram 2008   EF 50-55%, Mild MR, Mild TR,    . Hemorrhoids   . 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 Hyperlipemia   . Hypertension   . lung ca dx'd 10/2008   chemo comp 01/2009-stage 4 lung cancer  . Myocardial infarction ([place]) 02-18-1998  . Peptic stricture of esophagus   . Right carotid  bruit 04/01/12   minimal carotid stenosis on doppler    Past Surgical History:  Procedure Laterality Date  . ANGIOPLASTY     with 2 stent placement  . COLONOSCOPY    . CORONARY ANGIOPLASTY WITH STENT PLACEMENT  02/18/1998   PCI/stents to ostial and mid RCA, emergently, residual Lt main disease  . LUNG SURGERY     right  . POLYPECTOMY     HPP 09-21-08     reports that he quit smoking about 22 years ago. He has never used smokeless tobacco. He reports current alcohol use. He reports that he does not use drugs.  Allergies  Allergen Reactions  . Lipitor [Atorvastatin Calcium]     Knee pain   . Atorvastatin Other (See Comments)    Joint/knee pain    Family History  Problem Relation Age of Onset  . Healthy Sister   . Healthy Sister   . Stroke Mother   . Colon cancer Neg Hx   . Rectal cancer Neg Hx   . Stomach cancer Neg Hx      Prior to Admission medications   Medication Sig Start Date End Date Taking? Authorizing Provider  amLODipine (NORVASC) 5 MG tablet Take 1 tablet by mouth daily. 08/30/18  Yes [provider]  aspirin 325 MG EC tablet Take 325 mg by mouth daily.     Yes [provider]  ergocalciferol (VITAMIN D2) 1.25  MG (50000 UT) capsule Take 50,000 Units by mouth once a week.   Yes [provider]  esomeprazole (NEXIUM) 20 MG capsule Take 20 mg by mouth daily at 12 noon.   Yes [provider]  metoprolol tartrate (LOPRESSOR) 25 MG tablet Take 25 mg by mouth 2 (two) times daily.   Yes [provider]  nitroGLYCERIN (NITROSTAT) 0.4 MG SL tablet Place 1 tablet (0.4 mg total) under the tongue every 5 (five) minutes as needed for chest pain. 09/01/17  Yes Lorretta Harp, MD  oxybutynin (DITROPAN) 5 MG tablet Take 5 mg by mouth every 8 (eight) hours as needed for bladder spasms.  01/02/15  Yes [provider]  simvastatin (ZOCOR) 40 MG tablet Take 1 tablet (40 mg total) by mouth daily at 6 PM. 11/01/19  Yes Lorretta Harp, MD    Physical Exam: Vitals:   03/22/20 1630 03/22/20 1700 03/22/20 1715 03/22/20 1730  BP: (!) 159/99 (!) 149/78 (!) 146/79 (!) 145/82  Pulse: (!) 109 (!) 112 (!) 106 (!) 108  Resp: (!) 32 (!) 29 (!) 25 (!) 35  Temp:      TempSrc:      SpO2: 96% 95% 94% 96%  Weight:      Height:        Constitutional: NAD, calm, comfortable Vitals:   03/22/20 1630 03/22/20 1700 03/22/20 1715 03/22/20 1730  BP: (!) 159/99 (!) 149/78 (!) 146/79 (!) 145/82  Pulse: (!) 109 (!) 112 (!) 106 (!) 108  Resp: (!) 32 (!) 29 (!) 25 (!) 35  Temp:      TempSrc:      SpO2: 96% 95% 94% 96%  Weight:      Height:       Eyes: PERRL, lids and conjunctivae normal ENMT: Mucous membranes are dry. Posterior pharynx clear of any exudate or lesions.Normal dentition.  Neck: normal, supple, no masses, no thyromegaly Respiratory: clear to auscultation bilaterally, no wheezing, no crackles.  Increased respiratory effort. No accessory muscle use.  Cardiovascular: Regular rate and rhythm, no murmurs / rubs / gallops. No extremity edema. 2+ pedal pulses. No carotid bruits.  Abdomen: no tenderness, no masses palpated. No hepatosplenomegaly. Bowel sounds positive.  Musculoskeletal: no clubbing / cyanosis. No joint deformity upper and lower extremities. Good ROM, no contractures. Normal muscle tone.  Skin: no rashes, lesions, ulcers. No induration Neurologic: CN 2-12 grossly intact. Sensation intact, DTR normal. Strength 5/5 in all 4.  Psychiatric: Normal judgment and insight. Alert and oriented x 3. Normal mood.     Labs on Admission: I have personally reviewed following labs and imaging studies  CBC: Recent Labs  Lab 03/21/20 2207  WBC 7.6  HGB 12.0*  HCT 37.6*  MCV 88.1  PLT 979   Basic Metabolic Panel: Recent Labs  Lab 03/21/20 2207  NA 134*  K 3.0*  CL 103  CO2 19*  GLUCOSE 134*  BUN 21  CREATININE 1.24  CALCIUM 9.2   GFR: Estimated Creatinine Clearance: 50.7 mL/min (by C-G formula  based on SCr of 1.24 mg/dL). Liver Function Tests: Recent Labs  Lab 03/21/20 2207  AST 19  ALT 16  ALKPHOS 63  BILITOT 1.3*  PROT 7.9  ALBUMIN 3.4*   Recent Labs  Lab 03/21/20 2207  LIPASE 27   No results for input(s): AMMONIA in the last 168 hours. Coagulation Profile: No results for input(s): INR, PROTIME in the last 168 hours. Cardiac Enzymes: No results for input(s): CKTOTAL, CKMB, CKMBINDEX, TROPONINI  in the last 168 hours. BNP (last 3 results) No results for input(s): PROBNP in the last 8760 hours. HbA1C: No results for input(s): HGBA1C in the last 72 hours. CBG: No results for input(s): GLUCAP in the last 168 hours. Lipid Profile: No results for input(s): CHOL, HDL, LDLCALC, TRIG, CHOLHDL, LDLDIRECT in the last 72 hours. Thyroid Function Tests: No results for input(s): TSH, T4TOTAL, FREET4, T3FREE, THYROIDAB in the last 72 hours. Anemia Panel: No results for input(s): VITAMINB12, FOLATE, FERRITIN, TIBC, IRON, RETICCTPCT in the last 72 hours. Urine analysis:    Component Value Date/Time   COLORURINE YELLOW 03/22/2020 1533   APPEARANCEUR CLEAR 03/22/2020 1533   LABSPEC >1.046 (H) 03/22/2020 1533   PHURINE 5.0 03/22/2020 1533   GLUCOSEU NEGATIVE 03/22/2020 1533   HGBUR SMALL (A) 03/22/2020 1533   BILIRUBINUR NEGATIVE 03/22/2020 1533   KETONESUR NEGATIVE 03/22/2020 1533   PROTEINUR 30 (A) 03/22/2020 1533   UROBILINOGEN 0.2 03/01/2009 0923   NITRITE NEGATIVE 03/22/2020 1533   LEUKOCYTESUR NEGATIVE 03/22/2020 1533    Radiological Exams on Admission: DG Chest 2 View  Result Date: 03/21/2020 CLINICAL DATA:  79 year old male with cough and back pain. Former smoker. Previous right middle lobectomy. EXAM: CHEST - 2 VIEW COMPARISON:  Chest CT 07/29/2019 and earlier. FINDINGS: Right lung volume loss and architectural distortion similar to the January CT. However, with increased streaky and indistinct right lung base opacity since that time. Left lung volume is stable.  No superimposed pneumothorax. No definite pleural effusion. No pulmonary edema. Mediastinal contours are stable and within normal limits. Visualized tracheal air column is within normal limits. No acute osseous abnormality identified. Negative visible bowel gas pattern. IMPRESSION: Sequelae of prior right middle lobectomy, but with superimposed new interstitial opacity in the right lower lung. Favor acute viral/atypical respiratory infection. No definite pleural effusion. Recommend Followup PA and lateral chest X-ray, or alternatively repeat restaging Chest CT, following therapy to ensure resolution and exclude recurrent malignancy. Electronically Signed   By: Genevie Ann M.D.   On: 03/21/2020 22:26   CT Angio Chest PE W and/or Wo Contrast  Result Date: 03/22/2020 CLINICAL DATA:  Abdominal pain, pleuritic RIGHT chest pain, history lung cancer post resection, possible RIGHT groin hernia, prior abnormal CT chest EXAM: CT ANGIOGRAPHY CHEST CT ABDOMEN AND PELVIS WITH CONTRAST TECHNIQUE: Multidetector CT imaging of the chest was performed using the standard protocol during bolus administration of intravenous contrast. Multiplanar CT image reconstructions and MIPs were obtained to evaluate the vascular anatomy. Multidetector CT imaging of the abdomen and pelvis was performed using the standard protocol during bolus administration of intravenous contrast. CONTRAST:  176mL OMNIPAQUE IOHEXOL 350 MG/ML SOLN IV. No oral contrast. COMPARISON:  CT chest 07/29/2019 FINDINGS: CTA CHEST FINDINGS Cardiovascular: Atherosclerotic calcifications of thoracic aorta and coronary arteries. Aorta normal caliber without aneurysm or dissection. Heart unremarkable. No pericardial effusion. Pulmonary arteries adequately opacified. Scattered respiratory motion artifacts at the lower lobes. No definite pulmonary emboli identified. Mediastinum/Nodes: Esophagus unremarkable. Base of cervical region normal appearance. 11 mm precarinal node slightly  increased from previous exam. Additional small normal size mediastinal lymph nodes without additional thoracic adenopathy. Lungs/Pleura: Partially loculated RIGHT pleural effusion. Compressive atelectasis of RIGHT lower lobe. Prior RIGHT middle lobe resection. Loculated fluid in RIGHT major fissure posteriorly. Underlying emphysematous changes. No acute infiltrate, pleural effusion or pneumothorax. Small focus of scarring in the RIGHT upper lobe unchanged image 29. Musculoskeletal: No acute osseous findings. Review of the MIP images confirms the above findings. CT ABDOMEN and PELVIS  FINDINGS Hepatobiliary: Calcified gallstone dependently in gallbladder measuring 5 mm diameter. Gallbladder and liver otherwise normal appearance. Pancreas: Normal appearance Spleen: Normal appearance Adrenals/Urinary Tract: Adrenal glands kidneys, ureters, and bladder normal appearance Stomach/Bowel: Short segment of appendiceal base normal appearance. Stomach and bowel loops normal appearance. Vascular/Lymphatic: Atherosclerotic calcifications of aortic and iliac arteries without aneurysm. No adenopathy. Reproductive: Prostatic enlargement gland measuring 6.5 x 6.5 x 5.5 cm (volume = 120 cm^3) Other: BILATERAL inguinal hernias containing fat larger on RIGHT. No free air or free fluid. No inflammatory process. Tiny umbilical hernia containing fat. Musculoskeletal: Bones demineralized. Degenerative disc disease changes thoracolumbar spine. Review of the MIP images confirms the above findings. IMPRESSION: No evidence of pulmonary embolism. Partially loculated RIGHT pleural effusion with compressive atelectasis of the RIGHT lower lobe. Prior RIGHT middle lobe resection. Single minimally enlarged precarinal lymph node 11 mm short axis. No acute intra-abdominal or intrapelvic abnormalities. Prostatic enlargement. BILATERAL inguinal hernias containing fat larger on RIGHT. Cholelithiasis. Aortic Atherosclerosis (ICD10-I70.0) and Emphysema  (ICD10-J43.9). Electronically Signed   By: Lavonia Dana M.D.   On: 03/22/2020 15:03   CT ABDOMEN PELVIS W CONTRAST  Result Date: 03/22/2020 CLINICAL DATA:  Abdominal pain, pleuritic RIGHT chest pain, history lung cancer post resection, possible RIGHT groin hernia, prior abnormal CT chest EXAM: CT ANGIOGRAPHY CHEST CT ABDOMEN AND PELVIS WITH CONTRAST TECHNIQUE: Multidetector CT imaging of the chest was performed using the standard protocol during bolus administration of intravenous contrast. Multiplanar CT image reconstructions and MIPs were obtained to evaluate the vascular anatomy. Multidetector CT imaging of the abdomen and pelvis was performed using the standard protocol during bolus administration of intravenous contrast. CONTRAST:  141mL OMNIPAQUE IOHEXOL 350 MG/ML SOLN IV. No oral contrast. COMPARISON:  CT chest 07/29/2019 FINDINGS: CTA CHEST FINDINGS Cardiovascular: Atherosclerotic calcifications of thoracic aorta and coronary arteries. Aorta normal caliber without aneurysm or dissection. Heart unremarkable. No pericardial effusion. Pulmonary arteries adequately opacified. Scattered respiratory motion artifacts at the lower lobes. No definite pulmonary emboli identified. Mediastinum/Nodes: Esophagus unremarkable. Base of cervical region normal appearance. 11 mm precarinal node slightly increased from previous exam. Additional small normal size mediastinal lymph nodes without additional thoracic adenopathy. Lungs/Pleura: Partially loculated RIGHT pleural effusion. Compressive atelectasis of RIGHT lower lobe. Prior RIGHT middle lobe resection. Loculated fluid in RIGHT major fissure posteriorly. Underlying emphysematous changes. No acute infiltrate, pleural effusion or pneumothorax. Small focus of scarring in the RIGHT upper lobe unchanged image 29. Musculoskeletal: No acute osseous findings. Review of the MIP images confirms the above findings. CT ABDOMEN and PELVIS FINDINGS Hepatobiliary: Calcified  gallstone dependently in gallbladder measuring 5 mm diameter. Gallbladder and liver otherwise normal appearance. Pancreas: Normal appearance Spleen: Normal appearance Adrenals/Urinary Tract: Adrenal glands kidneys, ureters, and bladder normal appearance Stomach/Bowel: Short segment of appendiceal base normal appearance. Stomach and bowel loops normal appearance. Vascular/Lymphatic: Atherosclerotic calcifications of aortic and iliac arteries without aneurysm. No adenopathy. Reproductive: Prostatic enlargement gland measuring 6.5 x 6.5 x 5.5 cm (volume = 120 cm^3) Other: BILATERAL inguinal hernias containing fat larger on RIGHT. No free air or free fluid. No inflammatory process. Tiny umbilical hernia containing fat. Musculoskeletal: Bones demineralized. Degenerative disc disease changes thoracolumbar spine. Review of the MIP images confirms the above findings. IMPRESSION: No evidence of pulmonary embolism. Partially loculated RIGHT pleural effusion with compressive atelectasis of the RIGHT lower lobe. Prior RIGHT middle lobe resection. Single minimally enlarged precarinal lymph node 11 mm short axis. No acute intra-abdominal or intrapelvic abnormalities. Prostatic enlargement. BILATERAL inguinal hernias containing fat larger on RIGHT.  Cholelithiasis. Aortic Atherosclerosis (ICD10-I70.0) and Emphysema (ICD10-J43.9). Electronically Signed   By: Lavonia Dana M.D.   On: 03/22/2020 15:03    EKG: Independently reviewed.  Chronic RBBB   Assessment/Plan Active Problems:   COVID-19  (please populate well all problems here in Problem List. (For example, if patient is on BP meds at home and you resume or decide to hold them, it is a problem that needs to be her. Same for CAD, COPD, HLD and so on)  COVID-19 infection with impending respiratory failure -Patient was vaccinated for COVID with Pfeizer vaccine x2 in Feb. -Given the sudden onset and progressive tachypnea and cough, suspect patient has fast progressive  COVID-19 infection although no obvious PNA was appreciated at this time, but given the acute onset of the symptoms, will start remdesivir and steroid -PE ruled out -Breathing treatment and other supportive care, encourage prone position  Dehydration -Clinically looks hypovolemic, and the tachycardia, will start maintenance IV fluid  Hypokalemia -PO replacement  HTN -Continue amlodipine and beta-blocker  History of CAD -Troponin negative x2, ACS ruled out.   DVT prophylaxis: Lovenox  code Status: Full Code Family Communication: Left wife message Disposition Plan: Expect 3 to 5 days hospital stay to treat COVID-19 infection  consults called: None Admission status: Telemetry admission   Lequita Halt MD [facility] Pager [phone]  03/22/2020, 5:41 PM

## 2020-03-22 NOTE — ED Notes (Signed)
Attempted to call wife Bonnita Nasuti) to give update. There was no answer. Per pt, this RN left a voicemail to be called back.

## 2020-03-23 ENCOUNTER — Inpatient Hospital Stay (HOSPITAL_COMMUNITY): Payer: Medicare Other

## 2020-03-23 DIAGNOSIS — M7989 Other specified soft tissue disorders: Secondary | ICD-10-CM

## 2020-03-23 DIAGNOSIS — U071 COVID-19: Principal | ICD-10-CM

## 2020-03-23 DIAGNOSIS — K4020 Bilateral inguinal hernia, without obstruction or gangrene, not specified as recurrent: Secondary | ICD-10-CM

## 2020-03-23 DIAGNOSIS — J90 Pleural effusion, not elsewhere classified: Secondary | ICD-10-CM | POA: Insufficient documentation

## 2020-03-23 LAB — CBC WITH DIFFERENTIAL/PLATELET
Abs Immature Granulocytes: 0.17 10*3/uL — ABNORMAL HIGH (ref 0.00–0.07)
Basophils Absolute: 0 10*3/uL (ref 0.0–0.1)
Basophils Relative: 0 %
Eosinophils Absolute: 0 10*3/uL (ref 0.0–0.5)
Eosinophils Relative: 0 %
HCT: 36.8 % — ABNORMAL LOW (ref 39.0–52.0)
Hemoglobin: 11.9 g/dL — ABNORMAL LOW (ref 13.0–17.0)
Immature Granulocytes: 1 %
Lymphocytes Relative: 3 %
Lymphs Abs: 0.4 10*3/uL — ABNORMAL LOW (ref 0.7–4.0)
MCH: 28.5 pg (ref 26.0–34.0)
MCHC: 32.3 g/dL (ref 30.0–36.0)
MCV: 88 fL (ref 80.0–100.0)
Monocytes Absolute: 0.8 10*3/uL (ref 0.1–1.0)
Monocytes Relative: 7 %
Neutro Abs: 11.4 10*3/uL — ABNORMAL HIGH (ref 1.7–7.7)
Neutrophils Relative %: 89 %
Platelets: 233 10*3/uL (ref 150–400)
RBC: 4.18 MIL/uL — ABNORMAL LOW (ref 4.22–5.81)
RDW: 13.8 % (ref 11.5–15.5)
WBC: 12.8 10*3/uL — ABNORMAL HIGH (ref 4.0–10.5)
nRBC: 0 % (ref 0.0–0.2)

## 2020-03-23 LAB — COMPREHENSIVE METABOLIC PANEL
ALT: 14 U/L (ref 0–44)
AST: 16 U/L (ref 15–41)
Albumin: 2.9 g/dL — ABNORMAL LOW (ref 3.5–5.0)
Alkaline Phosphatase: 64 U/L (ref 38–126)
Anion gap: 10 (ref 5–15)
BUN: 17 mg/dL (ref 8–23)
CO2: 22 mmol/L (ref 22–32)
Calcium: 9.1 mg/dL (ref 8.9–10.3)
Chloride: 103 mmol/L (ref 98–111)
Creatinine, Ser: 0.93 mg/dL (ref 0.61–1.24)
GFR calc Af Amer: >60 mL/min (ref >60)
GFR calc non Af Amer: >60 mL/min (ref >60)
Glucose, Bld: 138 mg/dL — ABNORMAL HIGH (ref 70–99)
Potassium: 3.8 mmol/L (ref 3.5–5.1)
Sodium: 135 mmol/L (ref 135–145)
Total Bilirubin: 1.1 mg/dL (ref 0.3–1.2)
Total Protein: 7.4 g/dL (ref 6.5–8.1)

## 2020-03-23 LAB — BODY FLUID CELL COUNT WITH DIFFERENTIAL
Eos, Fluid: 1 %
Lymphs, Fluid: 8 %
Monocyte-Macrophage-Serous Fluid: 13 % — ABNORMAL LOW (ref 50–90)
Neutrophil Count, Fluid: 78 % — ABNORMAL HIGH (ref 0–25)
Total Nucleated Cell Count, Fluid: 1510 cu mm — ABNORMAL HIGH (ref 0–1000)

## 2020-03-23 LAB — HEMOGLOBIN A1C
Hgb A1c MFr Bld: 5.6 % (ref 4.8–5.6)
Mean Plasma Glucose: 114.02 mg/dL

## 2020-03-23 LAB — PROTEIN, PLEURAL OR PERITONEAL FLUID: Total protein, fluid: 5.9 g/dL

## 2020-03-23 LAB — CBG MONITORING, ED
Glucose-Capillary: 167 mg/dL — ABNORMAL HIGH (ref 70–99)
Glucose-Capillary: 139 mg/dL — ABNORMAL HIGH (ref 70–99)
Glucose-Capillary: 220 mg/dL — ABNORMAL HIGH (ref 70–99)
Glucose-Capillary: 141 mg/dL — ABNORMAL HIGH (ref 70–99)

## 2020-03-23 LAB — LACTATE DEHYDROGENASE, PLEURAL OR PERITONEAL FLUID: LD, Fluid: 1292 U/L — ABNORMAL HIGH (ref 3–23)

## 2020-03-23 LAB — D-DIMER, QUANTITATIVE: D-Dimer, Quant: 2.86 ug/mL-FEU — ABNORMAL HIGH (ref 0.00–0.50)

## 2020-03-23 LAB — BRAIN NATRIURETIC PEPTIDE: B Natriuretic Peptide: 352.7 pg/mL — ABNORMAL HIGH (ref 0.0–100.0)

## 2020-03-23 LAB — MAGNESIUM: Magnesium: 1.7 mg/dL (ref 1.7–2.4)

## 2020-03-23 LAB — C-REACTIVE PROTEIN: CRP: 32.5 mg/dL — ABNORMAL HIGH (ref <1.0)

## 2020-03-23 LAB — FERRITIN: Ferritin: 367 ng/mL — ABNORMAL HIGH (ref 24–336)

## 2020-03-23 LAB — PHOSPHORUS: Phosphorus: 2.4 mg/dL — ABNORMAL LOW (ref 2.5–4.6)

## 2020-03-23 MED ORDER — METOPROLOL TARTRATE 50 MG PO TABS
50.0000 mg | ORAL_TABLET | Freq: Two times a day (BID) | ORAL | Status: DC
  Administered 2020-03-23 – 2020-03-26 (×7): 50 mg via ORAL
  Filled 2020-03-23: qty 1
  Filled 2020-03-23: qty 2
  Filled 2020-03-23 (×4): qty 1
  Filled 2020-03-23: qty 2
  Filled 2020-03-23: qty 1

## 2020-03-23 MED ORDER — ENOXAPARIN SODIUM 60 MG/0.6ML ~~LOC~~ SOLN
60.0000 mg | Freq: Two times a day (BID) | SUBCUTANEOUS | Status: DC
  Administered 2020-03-23 – 2020-03-28 (×11): 60 mg via SUBCUTANEOUS
  Filled 2020-03-23 (×11): qty 0.6

## 2020-03-23 MED ORDER — INSULIN ASPART 100 UNIT/ML ~~LOC~~ SOLN: 0.0000 [IU] | Freq: Every day | SUBCUTANEOUS | Status: DC

## 2020-03-23 MED ORDER — HYDRALAZINE HCL 20 MG/ML IJ SOLN: 10.0000 mg | Freq: Four times a day (QID) | INTRAMUSCULAR | Status: DC | PRN

## 2020-03-23 MED ORDER — K PHOS MONO-SOD PHOS DI & MONO 155-852-130 MG PO TABS
500.0000 mg | ORAL_TABLET | Freq: Three times a day (TID) | ORAL | Status: AC
  Administered 2020-03-23 (×2): 500 mg via ORAL
  Filled 2020-03-23 (×3): qty 2

## 2020-03-23 MED ORDER — METHYLPREDNISOLONE SODIUM SUCC 125 MG IJ SOLR
60.0000 mg | Freq: Two times a day (BID) | INTRAMUSCULAR | Status: DC
  Administered 2020-03-23 – 2020-03-24 (×3): 60 mg via INTRAVENOUS
  Filled 2020-03-23 (×3): qty 2

## 2020-03-23 MED ORDER — INSULIN ASPART 100 UNIT/ML ~~LOC~~ SOLN
0.0000 [IU] | Freq: Three times a day (TID) | SUBCUTANEOUS | Status: DC
  Administered 2020-03-23: 3 [IU] via SUBCUTANEOUS
  Administered 2020-03-23 – 2020-03-24 (×3): 1 [IU] via SUBCUTANEOUS
  Administered 2020-03-25: 2 [IU] via SUBCUTANEOUS
  Administered 2020-03-28 (×2): 1 [IU] via SUBCUTANEOUS

## 2020-03-23 NOTE — Progress Notes (Signed)
PROGRESS NOTE                                                                                                                                                                                                             Patient Demographics:    Mathew Hall, is a 79 y.o. male, DOB - 09/02/1940, TYO:[id number]  Outpatient Primary MD for the patient is [name], MD    LOS - 1  Admit date - 03/21/2020    Chief Complaint  Patient presents with  . Back Pain       Brief Narrative - Mathew Hall is a 79 y.o. male with medical history significant of lung CA status post partial resection, hypertension, HLD, GERD, CAD with MI remote, presented with worsening back pain and cough.  Patient did eat grass cutting 2 days ago, and he said that usually after doing grass cutting we will have back pain for a day or 2.  ER work-up shows incidental COVID-19 infection along with a partially loculated right-sided pleural effusion and he was admitted to the hospital.   Subjective:    [name] today has, No headache,  No abdominal pain - No Nausea, No new weakness tingling or numbness but continues to have some right-sided flank pain with mild exertional shortness of breath.   Assessment  & Plan :     1. Incidental COVID-19 Infection in a patient is fully vaccinated.  Although his CRP is elevated he has no hypoxia or oxygen demand at rest, upon admission he was placed on steroids and Remdesivir due to his inflammation and some exertional shortness of breath, will continue this for the time being.  Although chance of true breakthrough infection is less.  Encouraged the patient to sit up in chair in the daytime use I-S and flutter valve for pulmonary toiletry and then prone in bed when at night.  Will advance activity and titrate down oxygen as possible.   SpO2: 90 %  Recent Labs  Lab 03/21/20 2207 03/22/20 1219 03/22/20 1318  03/22/20 2106 03/23/20 0251  WBC 7.6  --   --   --  12.8*  PLT 251  --   --   --  233  CRP  --   --   --   --  32.5*  DDIMER  --   --   --  2.76* 2.86*  AST 19  --   --   --  16  ALT 16  --   --   --  14  ALKPHOS 63  --   --   --  64  BILITOT 1.3*  --   --   --  1.1  ALBUMIN 3.4*  --   --   --  2.9*  LATICACIDVEN  --  1.2  --   --   --   SARSCOV2NAA  --   --  POSITIVE*  --   --     2.  History of  Metastatic nonsmall cell lung cancer, adenocarcinoma Right Lung cancer in the past ( 15 yrs ago, Dr Inda Merlin)  With status post neoadjuvant chemotherapy followed by right middle lobectomy with lymph node dissection  &  now with partially loculated right-sided pleural effusion.  Could be causing his symptoms and elevated CRP, he continues to have some flank discomfort and mild exertional shortness of breath.  Have consulted pulmonary, may require thoracentesis and further evaluation of this issue.  3.  Borderline elevated D-dimer.  Due to inflammation from #1 and 2.  Intermediate dose Lovenox, check leg ultrasound.  4.  Essential hypertension.  On Norvasc, will increase beta-blocker dose for better control, as needed IV hydralazine added.  5.  History of CAD.  Chest pain-free no acute issues, on beta-blocker, aspirin and statin for secondary infection.  Continue.  6.  Dehydration.  Hydrate with IV fluids.     Condition -   Guarded  Family Communication  : Wife Meroney [phone] on 03/23/2020  Code Status :  Full  Consults  :  Pulm  Procedures  :    CT - No evidence of pulmonary embolism. Partially loculated RIGHT pleural effusion with compressive atelectasis of the RIGHT lower lobe. Prior RIGHT middle lobe resection. Single minimally enlarged precarinal lymph node 11 mm short axis. No acute intra-abdominal or intrapelvic abnormalities. Prostatic enlargement. BILATERAL inguinal hernias containing fat larger on RIGHT. Cholelithiasis. Aortic Atherosclerosis (ICD10-I70.0) and Emphysema  (ICD10-J43.9).  PUD Prophylaxis : PPI  Disposition Plan  :    Status is: Inpatient  Remains inpatient appropriate because:IV treatments appropriate due to intensity of illness or inability to take PO   Dispo: The patient is from: Home              Anticipated d/c is to: Home              Anticipated d/c date is: 3 days              Patient currently is not medically stable to d/c.   DVT Prophylaxis  :  Lovenox    Lab Results  Component Value Date   PLT 233 03/23/2020    Diet :  Diet Order            Diet Heart Room service appropriate? Yes; Fluid consistency: Thin  Diet effective now                  Inpatient Medications  Scheduled Meds: . amLODipine  5 mg Oral Daily  . aspirin  325 mg Oral Daily  . enoxaparin (LOVENOX) injection  60 mg Subcutaneous Q12H  . insulin aspart  0-5 Units Subcutaneous QHS  . insulin aspart  0-9 Units Subcutaneous TID WC  . ipratropium  2 puff Inhalation Q6H  . methylPREDNISolone (SOLU-MEDROL) injection  60 mg Intravenous Q12H  . metoprolol tartrate  25 mg Oral BID  .  pantoprazole  40 mg Oral Daily  . phosphorus  500 mg Oral TID  . simvastatin  40 mg Oral q1800   Continuous Infusions: . sodium chloride    . sodium chloride 100 mL/hr at 03/23/20 0644  . remdesivir 100 mg in NS 100 mL     PRN Meds:.acetaminophen, guaiFENesin-dextromethorphan, [DISCONTINUED] ondansetron **OR** ondansetron (ZOFRAN) IV, oxybutynin  Antibiotics  :    Anti-infectives (From admission, onward)   Start     Dose/Rate Route Frequency Ordered Stop   03/23/20 1000  remdesivir 100 mg in sodium chloride 0.9 % 100 mL IVPB       "Followed by" Linked Group Details   100 mg 200 mL/hr over 30 Minutes Intravenous Daily 03/22/20 1806 03/27/20 0959   03/22/20 1845  remdesivir 200 mg in sodium chloride 0.9% 250 mL IVPB       "Followed by" Linked Group Details   200 mg 580 mL/hr over 30 Minutes Intravenous Once 03/22/20 1806 03/22/20 2025       Time Spent in  minutes  30   Lala Lund M.D on 03/23/2020 at 9:04 AM  To page go to [email or web address] - password [facility]  [facility] -  Office  [phone]     See all Orders from today for further details    Objective:   Vitals:   03/22/20 2158 03/22/20 2200 03/23/20 0432 03/23/20 0600  BP:  139/84 (!) 145/64 (!) 159/69  Pulse:  (!) 107 83 (!) 107  Resp:  (!) 23 15 (!) 22  Temp: 98.6 F (37 C)     TempSrc: Oral     SpO2:  96% 97% 90%  Weight:      Height:        Wt Readings from Last 3 Encounters:  03/22/20 79.4 kg  09/02/19 78.9 kg  08/01/19 79.2 kg    No intake or output data in the 24 hours ending 03/23/20 0904   Physical Exam  Awake Alert, No new F.N deficits, Normal affect [place].AT,PERRAL Supple Neck,No JVD, No cervical lymphadenopathy appriciated.  Symmetrical Chest wall movement,  mildly diminished right lower breath sounds. RRR,No Gallops,Rubs or new Murmurs, No Parasternal Heave +ve B.Sounds, Abd Soft, No tenderness, No organomegaly appriciated, No rebound - guarding or rigidity. No Cyanosis, Clubbing or edema, No new Rash or bruise      Data Review:    CBC Recent Labs  Lab 03/21/20 2207 03/23/20 0251  WBC 7.6 12.8*  HGB 12.0* 11.9*  HCT 37.6* 36.8*  PLT 251 233  MCV 88.1 88.0  MCH 28.1 28.5  MCHC 31.9 32.3  RDW 13.4 13.8  LYMPHSABS  --  0.4*  MONOABS  --  0.8  EOSABS  --  0.0  BASOSABS  --  0.0    Recent Labs  Lab 03/21/20 2207 03/22/20 1219 03/22/20 2106 03/23/20 0251  NA 134*  --   --  135  K 3.0*  --   --  3.8  CL 103  --   --  103  CO2 19*  --   --  22  GLUCOSE 134*  --   --  138*  BUN 21  --   --  17  CREATININE 1.24  --   --  0.93  CALCIUM 9.2  --   --  9.1  AST 19  --   --  16  ALT 16  --   --  14  ALKPHOS 63  --   --  64  BILITOT 1.3*  --   --  1.1  ALBUMIN 3.4*  --   --  2.9*  MG  --   --   --  1.7  CRP  --   --   --  32.5*  DDIMER  --   --  2.76* 2.86*  LATICACIDVEN  --  1.2  --   --      ------------------------------------------------------------------------------------------------------------------ No results for input(s): CHOL, HDL, LDLCALC, TRIG, CHOLHDL, LDLDIRECT in the last 72 hours.  No results found for: HGBA1C ------------------------------------------------------------------------------------------------------------------ No results for input(s): TSH, T4TOTAL, T3FREE, THYROIDAB in the last 72 hours.  Invalid input(s): FREET3  Cardiac Enzymes No results for input(s): CKMB, TROPONINI, MYOGLOBIN in the last 168 hours.  Invalid input(s): CK ------------------------------------------------------------------------------------------------------------------ No results found for: BNP  Micro Results Recent Results (from the past 240 hour(s))  SARS Coronavirus 2 by RT PCR (hospital order, performed in [facility] hospital lab) Nasopharyngeal Nasopharyngeal Swab     Status: Abnormal   Collection Time: 03/22/20  1:18 PM   Specimen: Nasopharyngeal Swab  Result Value Ref Range Status   SARS Coronavirus 2 POSITIVE (A) NEGATIVE Final    Comment: RESULT CALLED TO, READ BACK BY AND VERIFIED WITHKeturah Barre RN 1528 03/22/20 A BROWNING (NOTE) SARS-CoV-2 target nucleic acids are DETECTED  SARS-CoV-2 RNA is generally detectable in upper respiratory specimens  during the acute phase of infection.  Positive results are indicative  of the presence of the identified virus, but do not rule out bacterial infection or co-infection with other pathogens not detected by the test.  Clinical correlation with patient history and  other diagnostic information is necessary to determine patient infection status.  The expected result is negative.  Fact Sheet for Patients:   StrictlyIdeas.no   Fact Sheet for Healthcare Providers:   BankingDealers.co.za    This test is not yet approved or cleared by the [place] FDA and  has been  authorized for detection and/or diagnosis of SARS-CoV-2 by FDA under an Emergency Use Authorization (EUA).  This EUA will remain in effect (meaning this  test can be used) for the duration of  the COVID-19 declaration under Section 564(b)(1) of the Act, 21 U.S.C. section 360-bbb-3(b)(1), unless the authorization is terminated or revoked sooner.  Performed at [facility], [place] [address] [place], [place] [address]     Radiology Reports DG Chest 2 View  Result Date: 03/21/2020 CLINICAL DATA:  79 year old male with cough and back pain. Former smoker. Previous right middle lobectomy. EXAM: CHEST - 2 VIEW COMPARISON:  Chest CT 07/29/2019 and earlier. FINDINGS: Right lung volume loss and architectural distortion similar to the January CT. However, with increased streaky and indistinct right lung base opacity since that time. Left lung volume is stable. No superimposed pneumothorax. No definite pleural effusion. No pulmonary edema. Mediastinal contours are stable and within normal limits. Visualized tracheal air column is within normal limits. No acute osseous abnormality identified. Negative visible bowel gas pattern. IMPRESSION: Sequelae of prior right middle lobectomy, but with superimposed new interstitial opacity in the right lower lung. Favor acute viral/atypical respiratory infection. No definite pleural effusion. Recommend Followup PA and lateral chest X-ray, or alternatively repeat restaging Chest CT, following therapy to ensure resolution and exclude recurrent malignancy. Electronically Signed   By: Genevie Ann M.D.   On: 03/21/2020 22:26   CT Angio Chest PE W and/or Wo Contrast  Result Date: 03/22/2020 CLINICAL DATA:  Abdominal pain, pleuritic RIGHT chest pain, history lung cancer post resection, possible RIGHT groin hernia, prior abnormal  CT chest EXAM: CT ANGIOGRAPHY CHEST CT ABDOMEN AND PELVIS WITH CONTRAST TECHNIQUE: Multidetector CT imaging of the chest was performed using the standard  protocol during bolus administration of intravenous contrast. Multiplanar CT image reconstructions and MIPs were obtained to evaluate the vascular anatomy. Multidetector CT imaging of the abdomen and pelvis was performed using the standard protocol during bolus administration of intravenous contrast. CONTRAST:  151mL OMNIPAQUE IOHEXOL 350 MG/ML SOLN IV. No oral contrast. COMPARISON:  CT chest 07/29/2019 FINDINGS: CTA CHEST FINDINGS Cardiovascular: Atherosclerotic calcifications of thoracic aorta and coronary arteries. Aorta normal caliber without aneurysm or dissection. Heart unremarkable. No pericardial effusion. Pulmonary arteries adequately opacified. Scattered respiratory motion artifacts at the lower lobes. No definite pulmonary emboli identified. Mediastinum/Nodes: Esophagus unremarkable. Base of cervical region normal appearance. 11 mm precarinal node slightly increased from previous exam. Additional small normal size mediastinal lymph nodes without additional thoracic adenopathy. Lungs/Pleura: Partially loculated RIGHT pleural effusion. Compressive atelectasis of RIGHT lower lobe. Prior RIGHT middle lobe resection. Loculated fluid in RIGHT major fissure posteriorly. Underlying emphysematous changes. No acute infiltrate, pleural effusion or pneumothorax. Small focus of scarring in the RIGHT upper lobe unchanged image 29. Musculoskeletal: No acute osseous findings. Review of the MIP images confirms the above findings. CT ABDOMEN and PELVIS FINDINGS Hepatobiliary: Calcified gallstone dependently in gallbladder measuring 5 mm diameter. Gallbladder and liver otherwise normal appearance. Pancreas: Normal appearance Spleen: Normal appearance Adrenals/Urinary Tract: Adrenal glands kidneys, ureters, and bladder normal appearance Stomach/Bowel: Short segment of appendiceal base normal appearance. Stomach and bowel loops normal appearance. Vascular/Lymphatic: Atherosclerotic calcifications of aortic and iliac arteries  without aneurysm. No adenopathy. Reproductive: Prostatic enlargement gland measuring 6.5 x 6.5 x 5.5 cm (volume = 120 cm^3) Other: BILATERAL inguinal hernias containing fat larger on RIGHT. No free air or free fluid. No inflammatory process. Tiny umbilical hernia containing fat. Musculoskeletal: Bones demineralized. Degenerative disc disease changes thoracolumbar spine. Review of the MIP images confirms the above findings. IMPRESSION: No evidence of pulmonary embolism. Partially loculated RIGHT pleural effusion with compressive atelectasis of the RIGHT lower lobe. Prior RIGHT middle lobe resection. Single minimally enlarged precarinal lymph node 11 mm short axis. No acute intra-abdominal or intrapelvic abnormalities. Prostatic enlargement. BILATERAL inguinal hernias containing fat larger on RIGHT. Cholelithiasis. Aortic Atherosclerosis (ICD10-I70.0) and Emphysema (ICD10-J43.9). Electronically Signed   By: Lavonia Dana M.D.   On: 03/22/2020 15:03   CT ABDOMEN PELVIS W CONTRAST  Result Date: 03/22/2020 CLINICAL DATA:  Abdominal pain, pleuritic RIGHT chest pain, history lung cancer post resection, possible RIGHT groin hernia, prior abnormal CT chest EXAM: CT ANGIOGRAPHY CHEST CT ABDOMEN AND PELVIS WITH CONTRAST TECHNIQUE: Multidetector CT imaging of the chest was performed using the standard protocol during bolus administration of intravenous contrast. Multiplanar CT image reconstructions and MIPs were obtained to evaluate the vascular anatomy. Multidetector CT imaging of the abdomen and pelvis was performed using the standard protocol during bolus administration of intravenous contrast. CONTRAST:  176mL OMNIPAQUE IOHEXOL 350 MG/ML SOLN IV. No oral contrast. COMPARISON:  CT chest 07/29/2019 FINDINGS: CTA CHEST FINDINGS Cardiovascular: Atherosclerotic calcifications of thoracic aorta and coronary arteries. Aorta normal caliber without aneurysm or dissection. Heart unremarkable. No pericardial effusion. Pulmonary  arteries adequately opacified. Scattered respiratory motion artifacts at the lower lobes. No definite pulmonary emboli identified. Mediastinum/Nodes: Esophagus unremarkable. Base of cervical region normal appearance. 11 mm precarinal node slightly increased from previous exam. Additional small normal size mediastinal lymph nodes without additional thoracic adenopathy. Lungs/Pleura: Partially loculated RIGHT pleural effusion. Compressive atelectasis of RIGHT lower lobe.  Prior RIGHT middle lobe resection. Loculated fluid in RIGHT major fissure posteriorly. Underlying emphysematous changes. No acute infiltrate, pleural effusion or pneumothorax. Small focus of scarring in the RIGHT upper lobe unchanged image 29. Musculoskeletal: No acute osseous findings. Review of the MIP images confirms the above findings. CT ABDOMEN and PELVIS FINDINGS Hepatobiliary: Calcified gallstone dependently in gallbladder measuring 5 mm diameter. Gallbladder and liver otherwise normal appearance. Pancreas: Normal appearance Spleen: Normal appearance Adrenals/Urinary Tract: Adrenal glands kidneys, ureters, and bladder normal appearance Stomach/Bowel: Short segment of appendiceal base normal appearance. Stomach and bowel loops normal appearance. Vascular/Lymphatic: Atherosclerotic calcifications of aortic and iliac arteries without aneurysm. No adenopathy. Reproductive: Prostatic enlargement gland measuring 6.5 x 6.5 x 5.5 cm (volume = 120 cm^3) Other: BILATERAL inguinal hernias containing fat larger on RIGHT. No free air or free fluid. No inflammatory process. Tiny umbilical hernia containing fat. Musculoskeletal: Bones demineralized. Degenerative disc disease changes thoracolumbar spine. Review of the MIP images confirms the above findings. IMPRESSION: No evidence of pulmonary embolism. Partially loculated RIGHT pleural effusion with compressive atelectasis of the RIGHT lower lobe. Prior RIGHT middle lobe resection. Single minimally enlarged  precarinal lymph node 11 mm short axis. No acute intra-abdominal or intrapelvic abnormalities. Prostatic enlargement. BILATERAL inguinal hernias containing fat larger on RIGHT. Cholelithiasis. Aortic Atherosclerosis (ICD10-I70.0) and Emphysema (ICD10-J43.9). Electronically Signed   By: Lavonia Dana M.D.   On: 03/22/2020 15:03

## 2020-03-23 NOTE — Progress Notes (Signed)
Bilateral lower Ext. Study completed. See CVProc for preliminary results.   Griffin Basil, RDMS, RVT

## 2020-03-23 NOTE — Consult Note (Signed)
NAME:  Mathew Hall, MRN:  [id number], DOB:  11/11/1940, LOS: 1 ADMISSION DATE:  03/21/2020, CONSULTATION DATE:  9/17  REFERRING MD:  Dr. Candiss Norse, CHIEF COMPLAINT:  Back Pain   Brief History   79 y/o M admitted 9/15 with back pain (posterior mid-back), found to be incidentally positive for COVID.  Work up consistent with right sided pleural effusion.   History of present illness   79 y/o M who presented to [facility] ER on 9/15 with reports of back pain.    The patient is independent of all ADL's at baseline, he continues to drive, mow his grass.  He reports he received his last COVID vaccine the last Saturday in February 2021.  He and his wife do not get out very often.  They no longer go to church.  Occasional trips to the grocery and have eaten out in restaurants a "couple of times". 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 Earlie Server.   He reports for the last month he has had mid back pain after mowing the grass. The pain was spasmodic and intermittent but acutely sharp.  He states after a couple of days, the pain would go away on its own. He has not had chest pain or shortness of breath.  He denies fevers, chills, n/v, diarrhea.  Denies weight loss, night sweats. Additionally, he reported an inguinal hernia. He denies difficulty with choking on foods, swallowing and no episodes of recent vomiting.    ER work up included a CTA of the chest which was negative for PE but showed a partially loculated pleural effusion. Additionally, abd / pelvis CT was evaluated with reports of hernia/inguinal pain which showed no acute intra-abdominal or intrapelvic abnormality, cholelithiasis noted and bilateral inguinal hernias R>L.  Labs - Na 135, K 3.8, BUN 17, Sr Cr 0.93, WBC 12.8, Hgb 11.9 and platelets 233.    PCCM consulted for evaluation of effusion.   Past Medical History  CAD s/p MI  HTN HLD GERD Asthma  Metastatic Non Small Cell lung  cancer, adenocarcinoma.  Diagnosed 11/2008 with RML resection in 02/2009.  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  S/P 3 cycles chemo.   Significant Hospital Events   9/15 Admit   Consults:     Procedures:     Significant Diagnostic Tests:  CT Angio Chest 9/17 >>   Micro Data:  COVID 9/16 >> positive  UC 9/16 >>  Antimicrobials:    Interim history/subjective:  As above  Afebrile  Pt denies complaints, on room air   Objective   Blood pressure 117/60, pulse 91, temperature 98.6 F (37 C), temperature source Oral, resp. rate (!) 31, height 5\' 10"  (1.778 m), weight 79.4 kg, SpO2 96 %.        Intake/Output Summary (Last 24 hours) at 03/23/2020 1203 Last data filed at 03/23/2020 1111 Gross per 24 hour  Intake 100 ml  Output --  Net 100 ml   Filed Weights   03/22/20 1323  Weight: 79.4 kg    Examination: General: elderly male lying in bed in NAD HEENT: MM pink/moist, mask in place, anicteric  Neuro: AAOx4, speech clear, MAE  CV: s1s2 RRR, no m/r/g PULM:  Non-labored on RA, lungs bilaterally clear  GI: soft, bsx4 active  Extremities: warm/dry, no edema  Skin: no rashes or lesions  Resolved Hospital Problem list      Assessment & Plan:  Pleuritic Chest Pain in setting of Right Pleural Effusion  Partially loculated right pleural effusion.  Differential dx includes post infectious parapneumonic process, CHF and recurrent lung cancer -assess right chest with [place] for thoracentesis  -follow CXR  -pulmonary hygiene -IS, mobilize  -assess pleural fluid for cell count, differential, culture, cytology, LDH, protein  -patient consented for procedure, risks/benefits explained  Hx of RML Adenocarcinoma s/p Resection / Chemo 2010 Followed by Dr. Earlie Server.  -follow cytology   Hx CAD, HTN, HLD, MI  -per primary   Best practice:  Diet: per primary  Pain/Anxiety/Delirium protocol (if indicated):  n/a  VAP protocol (if indicated): n/a  DVT prophylaxis: per primary  GI prophylaxis: per primary  Glucose control: per primary  Mobility: as tolerated  Code Status: Full Code  Family Communication: patient updated on plan of care Disposition: per primary   Labs   CBC: Recent Labs  Lab 03/21/20 2207 03/23/20 0251  WBC 7.6 12.8*  NEUTROABS  --  11.4*  HGB 12.0* 11.9*  HCT 37.6* 36.8*  MCV 88.1 88.0  PLT 251 737    Basic Metabolic Panel: Recent Labs  Lab 03/21/20 2207 03/23/20 0251  NA 134* 135  K 3.0* 3.8  CL 103 103  CO2 19* 22  GLUCOSE 134* 138*  BUN 21 17  CREATININE 1.24 0.93  CALCIUM 9.2 9.1  MG  --  1.7  PHOS  --  2.4*   GFR: Estimated Creatinine Clearance: 67.6 mL/min (by C-G formula based on SCr of 0.93 mg/dL). Recent Labs  Lab 03/21/20 2207 03/22/20 1219 03/23/20 0251  WBC 7.6  --  12.8*  LATICACIDVEN  --  1.2  --     Liver Function Tests: Recent Labs  Lab 03/21/20 2207 03/23/20 0251  AST 19 16  ALT 16 14  ALKPHOS 63 64  BILITOT 1.3* 1.1  PROT 7.9 7.4  ALBUMIN 3.4* 2.9*   Recent Labs  Lab 03/21/20 2207  LIPASE 27   No results for input(s): AMMONIA in the last 168 hours.  ABG    Component Value Date/Time   PHART 7.429 03/06/2009 0437   PCO2ART 36.0 03/06/2009 0437   PO2ART 62.0 (L) 03/06/2009 0437   HCO3 23.9 03/06/2009 0437   TCO2 25 03/06/2009 0437   ACIDBASEDEF 0.5 03/01/2009 0922   O2SAT 93.0 03/06/2009 0437     Coagulation Profile: No results for input(s): INR, PROTIME in the last 168 hours.  Cardiac Enzymes: No results for input(s): CKTOTAL, CKMB, CKMBINDEX, TROPONINI in the last 168 hours.  HbA1C: Hgb A1c MFr Bld  Date/Time Value Ref Range Status  03/23/2020 09:03 AM 5.6 4.8 - 5.6 % Final    Comment:    (NOTE) Pre diabetes:          5.7%-6.4%  Diabetes:              >6.4%  Glycemic control for   <7.0% adults with diabetes     CBG: Recent Labs  Lab 03/23/20 1008  GLUCAP 167*    Review of Systems:  Positives in [place]   Gen: Denies fever, chills, weight change, fatigue, night sweats HEENT: Denies blurred vision, double vision, hearing loss, tinnitus, sinus congestion, rhinorrhea, sore throat, neck stiffness, dysphagia PULM: Denies shortness of breath, cough, sputum production, hemoptysis, wheezing, posterior pleuritic chest pain  CV: Denies chest pain, edema, orthopnea, paroxysmal nocturnal dyspnea, palpitations GI: Denies abdominal pain, nausea, vomiting, diarrhea, hematochezia, melena, constipation, change in bowel habits GU: Denies dysuria, hematuria, polyuria, oliguria, urethral discharge Endocrine:  Denies hot or cold intolerance, polyuria, polyphagia or appetite change Derm: Denies rash, dry skin, scaling or peeling skin change Heme: Denies easy bruising, bleeding, bleeding gums Neuro: Denies headache, numbness, weakness, slurred speech, loss of memory or consciousness   Past Medical History  He,  has a past medical history of Asthma, CAD (coronary artery disease) (1999), Cataract, Colon polyps, GERD (gastroesophageal reflux disease), H/O cardiovascular stress test (1999/2005/2008/2011), H/O echocardiogram (2008), Hemorrhoids, acute myocardial infarction 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   Surgical History    Past Surgical History:  Procedure Laterality Date  . ANGIOPLASTY     with 2 stent placement  . COLONOSCOPY    . CORONARY ANGIOPLASTY WITH STENT PLACEMENT  02/18/1998   PCI/stents to ostial and mid RCA, emergently, residual Lt main disease  . LUNG SURGERY     right  . POLYPECTOMY     HPP 09-21-08     Social History   reports that he quit smoking about 22 years ago. He has never used smokeless tobacco. He reports current alcohol use. He reports that he does not use drugs.   Family History   His family history includes  Healthy in his sister and sister; Stroke in his mother. There is no history of Colon cancer, Rectal cancer, or Stomach cancer.   Allergies Allergies  Allergen Reactions  . Lipitor [Atorvastatin Calcium]     Knee pain   . Atorvastatin Other (See Comments)    Joint/knee pain     Home Medications  Prior to Admission medications   Medication Sig Start Date End Date Taking? Authorizing Provider  amLODipine (NORVASC) 5 MG tablet Take 1 tablet by mouth daily. 08/30/18  Yes [provider]  aspirin 325 MG EC tablet Take 325 mg by mouth daily.     Yes [provider]  ergocalciferol (VITAMIN D2) 1.25 MG (50000 UT) capsule Take 50,000 Units by mouth once a week.   Yes [provider]  esomeprazole (NEXIUM) 20 MG capsule Take 20 mg by mouth daily at 12 noon.   Yes [provider]  metoprolol tartrate (LOPRESSOR) 25 MG tablet Take 25 mg by mouth 2 (two) times daily.   Yes [provider]  nitroGLYCERIN (NITROSTAT) 0.4 MG SL tablet Place 1 tablet (0.4 mg total) under the tongue every 5 (five) minutes as needed for chest pain. 09/01/17  Yes Lorretta Harp, MD  oxybutynin (DITROPAN) 5 MG tablet Take 5 mg by mouth every 8 (eight) hours as needed for bladder spasms.  01/02/15  Yes [provider]  simvastatin (ZOCOR) 40 MG tablet Take 1 tablet (40 mg total) by mouth daily at 6 PM. 11/01/19  Yes Lorretta Harp, MD     Critical care time: n/a    Noe Gens, MSN, NP-C [facility] 03/23/2020, 12:03 PM   Please see [email or web address] for pager details.

## 2020-03-23 NOTE — Procedures (Signed)
Thoracentesis  Procedure Note  CLARK CUFF  [id number]  02-26-1941  Date:03/23/20  Time:2:33 PM   Provider Performing:Nuha Degner L Rust-Chester   Procedure: Thoracentesis with imaging guidance (40370)  Indication(s) Pleural Effusion  Consent Risks of the procedure as well as the alternatives and risks of each were explained to the patient and/or caregiver.  Consent for the procedure was obtained and is signed in the bedside chart  Anesthesia Topical only with 1% lidocaine    Time Out Verified patient identification, verified procedure, site/side was marked, verified correct patient position, special equipment/implants available, medications/allergies/relevant history reviewed, required imaging and test results available.   Sterile Technique Maximal sterile technique including full sterile barrier drape, hand hygiene, sterile gown, sterile gloves, mask, hair covering, sterile ultrasound probe cover (if used).  Procedure Description Ultrasound was used to identify appropriate pleural anatomy for placement and overlying skin marked.  Area of drainage cleaned and draped in sterile fashion. Lidocaine was used to anesthetize the skin and subcutaneous tissue.  220 cc's of cloudy yellow appearing fluid was drained from the right pleural space. Catheter then removed and bandaid applied to site.   Complications/Tolerance None; patient tolerated the procedure well. Chest X-ray is ordered to confirm no post-procedural complication.   EBL Minimal   Specimen(s) Pleural fluid        Domingo Pulse Rust-Chester, AGACNP-BC [facility]    Please see Amion for pager details.

## 2020-03-24 LAB — PHOSPHORUS: Phosphorus: 2.9 mg/dL (ref 2.5–4.6)

## 2020-03-24 LAB — COMPREHENSIVE METABOLIC PANEL
ALT: 14 U/L (ref 0–44)
AST: 19 U/L (ref 15–41)
Albumin: 2.5 g/dL — ABNORMAL LOW (ref 3.5–5.0)
Alkaline Phosphatase: 58 U/L (ref 38–126)
Anion gap: 11 (ref 5–15)
BUN: 25 mg/dL — ABNORMAL HIGH (ref 8–23)
CO2: 22 mmol/L (ref 22–32)
Calcium: 9 mg/dL (ref 8.9–10.3)
Chloride: 103 mmol/L (ref 98–111)
Creatinine, Ser: 0.79 mg/dL (ref 0.61–1.24)
GFR calc Af Amer: >60 mL/min (ref >60)
GFR calc non Af Amer: >60 mL/min (ref >60)
Glucose, Bld: 134 mg/dL — ABNORMAL HIGH (ref 70–99)
Potassium: 3.6 mmol/L (ref 3.5–5.1)
Sodium: 136 mmol/L (ref 135–145)
Total Bilirubin: 1 mg/dL (ref 0.3–1.2)
Total Protein: 6.7 g/dL (ref 6.5–8.1)

## 2020-03-24 LAB — GLUCOSE, CAPILLARY
Glucose-Capillary: 118 mg/dL — ABNORMAL HIGH (ref 70–99)
Glucose-Capillary: 134 mg/dL — ABNORMAL HIGH (ref 70–99)
Glucose-Capillary: 146 mg/dL — ABNORMAL HIGH (ref 70–99)
Glucose-Capillary: 145 mg/dL — ABNORMAL HIGH (ref 70–99)

## 2020-03-24 LAB — CBC WITH DIFFERENTIAL/PLATELET
Abs Immature Granulocytes: 0.15 10*3/uL — ABNORMAL HIGH (ref 0.00–0.07)
Basophils Absolute: 0 10*3/uL (ref 0.0–0.1)
Basophils Relative: 0 %
Eosinophils Absolute: 0 10*3/uL (ref 0.0–0.5)
Eosinophils Relative: 0 %
HCT: 34.9 % — ABNORMAL LOW (ref 39.0–52.0)
Hemoglobin: 11.2 g/dL — ABNORMAL LOW (ref 13.0–17.0)
Immature Granulocytes: 1 %
Lymphocytes Relative: 4 %
Lymphs Abs: 0.5 10*3/uL — ABNORMAL LOW (ref 0.7–4.0)
MCH: 28.1 pg (ref 26.0–34.0)
MCHC: 32.1 g/dL (ref 30.0–36.0)
MCV: 87.5 fL (ref 80.0–100.0)
Monocytes Absolute: 0.6 10*3/uL (ref 0.1–1.0)
Monocytes Relative: 5 %
Neutro Abs: 11.5 10*3/uL — ABNORMAL HIGH (ref 1.7–7.7)
Neutrophils Relative %: 90 %
Platelets: 235 10*3/uL (ref 150–400)
RBC: 3.99 MIL/uL — ABNORMAL LOW (ref 4.22–5.81)
RDW: 14 % (ref 11.5–15.5)
WBC: 12.8 10*3/uL — ABNORMAL HIGH (ref 4.0–10.5)
nRBC: 0 % (ref 0.0–0.2)

## 2020-03-24 LAB — MAGNESIUM: Magnesium: 1.9 mg/dL (ref 1.7–2.4)

## 2020-03-24 LAB — D-DIMER, QUANTITATIVE: D-Dimer, Quant: 2.9 ug/mL-FEU — ABNORMAL HIGH (ref 0.00–0.50)

## 2020-03-24 LAB — LACTATE DEHYDROGENASE: LDH: 119 U/L (ref 98–192)

## 2020-03-24 LAB — BRAIN NATRIURETIC PEPTIDE: B Natriuretic Peptide: 344.5 pg/mL — ABNORMAL HIGH (ref 0.0–100.0)

## 2020-03-24 LAB — C-REACTIVE PROTEIN: CRP: 26.6 mg/dL — ABNORMAL HIGH (ref <1.0)

## 2020-03-24 LAB — PROTEIN, TOTAL: Total Protein: 6.8 g/dL (ref 6.5–8.1)

## 2020-03-24 LAB — PROCALCITONIN: Procalcitonin: 5.21 ng/mL

## 2020-03-24 MED ORDER — SODIUM CHLORIDE 0.9 % IV SOLN
2.0000 g | INTRAVENOUS | Status: DC
  Administered 2020-03-24 – 2020-03-27 (×4): 2 g via INTRAVENOUS
  Filled 2020-03-24 (×4): qty 20

## 2020-03-24 MED ORDER — SODIUM CHLORIDE 0.9 % IV SOLN
500.0000 mg | INTRAVENOUS | Status: DC
  Administered 2020-03-24 – 2020-03-25 (×2): 500 mg via INTRAVENOUS
  Filled 2020-03-24 (×3): qty 500

## 2020-03-24 MED ORDER — METHYLPREDNISOLONE SODIUM SUCC 40 MG IJ SOLR: 40.0000 mg | Freq: Every day | INTRAMUSCULAR | Status: DC

## 2020-03-24 NOTE — Progress Notes (Addendum)
NAME:  EVERET FLAGG, MRN:  [id number], DOB:  09/22/1940, LOS: 2 ADMISSION DATE:  03/21/2020, CONSULTATION DATE:  9/17 REFERRING MD:  Candiss Norse, CHIEF COMPLAINT:  Back pain   Brief History   79 y/o M admitted 9/15 with back pain (posterior mid-back), found to be incidentally positive for COVID.  Work up consistent with right sided pleural effusion.   Past Medical History  CAD s/p MI  HTN HLD GERD Asthma  Metastatic Non Small Cell lung cancer, adenocarcinoma.  Diagnosed 11/2008 with RML resection in 02/2009.  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  S/P 3 cycles chemo.   Significant Hospital Events   9/15 Admit   Consults:    Procedures:  9/18 Thoracentesis> 220 cc cloudy fluid removed, right pleural space  Significant Diagnostic Tests:  CT Angio Chest 9/17 >> loculated pleural effusion in R base, very mild centrilobular emphysema in upper lobes bilaterally, Compressive atelectasis vs consolidation R base, thickening of airways in R base  Micro Data:  COVID 9/16 >> positive  UC 9/16 >>  Antimicrobials:  9/19  Interim history/subjective:  Feels "great"  Objective   Blood pressure 111/69, pulse 60, temperature 98.6 F (37 C), temperature source Oral, resp. rate 20, height 5\' 10"  (1.778 m), weight 76.7 kg, SpO2 92 %.       No intake or output data in the 24 hours ending 03/24/20 1349 Filed Weights   03/22/20 1323 03/24/20 0004  Weight: 79.4 kg 76.7 kg    Examination: General:  Resting comfortably in bed HENT: NCAT OP clear PULM: Diminished R base, normal effort CV: RRR, no mgr GI: BS+, soft, nontender MSK: normal bulk and tone Neuro: awake, alert, no distress, MAEW   Resolved Hospital Problem list     Assessment & Plan:  Pleuritic chest pain in setting of R pleural effusion> I worry that this is aspiration pneumonia given the patulous esophagus seen on his January 2021 film along with  tree-in-bud opacities which can be seen with aspiration or sometimes other conditions (bleeding, MAI, less likely sarcoid).   > start ceftriaxone and azithro > f/u pleural fluid culture  RML adenocarcinoma s/p resection/chemo 2010 > given history of lung cancer and this new finding will perform bronchoscopy on Monday for airway inspection and BAL  COVID 19  Agree with primary service management  Best practice:   Per Primary  Labs   CBC: Recent Labs  Lab 03/21/20 2207 03/23/20 0251 03/24/20 0156  WBC 7.6 12.8* 12.8*  NEUTROABS  --  11.4* 11.5*  HGB 12.0* 11.9* 11.2*  HCT 37.6* 36.8* 34.9*  MCV 88.1 88.0 87.5  PLT 251 233 527    Basic Metabolic Panel: Recent Labs  Lab 03/21/20 2207 03/23/20 0251 03/24/20 0156  NA 134* 135 136  K 3.0* 3.8 3.6  CL 103 103 103  CO2 19* 22 22  GLUCOSE 134* 138* 134*  BUN 21 17 25*  CREATININE 1.24 0.93 0.79  CALCIUM 9.2 9.1 9.0  MG  --  1.7 1.9  PHOS  --  2.4* 2.9   GFR: Estimated Creatinine Clearance: 78.6 mL/min (by C-G formula based on SCr of 0.79 mg/dL). Recent Labs  Lab 03/21/20 2207 03/22/20 1219 03/23/20 0251 03/24/20 0156  PROCALCITON  --   --   --  5.21  WBC 7.6  --  12.8* 12.8*  LATICACIDVEN  --  1.2  --   --     Liver Function Tests: Recent Labs  Lab 03/21/20 2207 03/23/20 0251 03/24/20 0156  AST 19 16 19   ALT 16 14 14   ALKPHOS 63 64 58  BILITOT 1.3* 1.1 1.0  PROT 7.9 7.4 6.7  6.8  ALBUMIN 3.4* 2.9* 2.5*   Recent Labs  Lab 03/21/20 2207  LIPASE 27   No results for input(s): AMMONIA in the last 168 hours.  ABG    Component Value Date/Time   PHART 7.429 03/06/2009 0437   PCO2ART 36.0 03/06/2009 0437   PO2ART 62.0 (L) 03/06/2009 0437   HCO3 23.9 03/06/2009 0437   TCO2 25 03/06/2009 0437   ACIDBASEDEF 0.5 03/01/2009 0922   O2SAT 93.0 03/06/2009 0437     Coagulation Profile: No results for input(s): INR, PROTIME in the last 168 hours.  Cardiac Enzymes: No results for input(s): CKTOTAL,  CKMB, CKMBINDEX, TROPONINI in the last 168 hours.  HbA1C: Hgb A1c MFr Bld  Date/Time Value Ref Range Status  03/23/2020 09:03 AM 5.6 4.8 - 5.6 % Final    Comment:    (NOTE) Pre diabetes:          5.7%-6.4%  Diabetes:              >6.4%  Glycemic control for   <7.0% adults with diabetes     CBG: Recent Labs  Lab 03/23/20 1439 03/23/20 2008 03/23/20 2243 03/24/20 0818 03/24/20 1134  GLUCAP 139* 220* 141* 118* 134*     Critical care time: n/a     Roselie Awkward, MD [facility] Pager: [phone] Cell: [phone] If no response, call [phone]

## 2020-03-24 NOTE — Evaluation (Signed)
Physical Therapy Evaluation and discharge Patient Details Name: Mathew Hall MRN: [id number] DOB: Jan 29, 1941 Today's Date: 03/24/2020   History of Present Illness  79 y.o. male with medical history significant of lung CA status post partial resection, hypertension, HLD, GERD, CAD with MI remote, presented with worsening back pain and cough. ER work-up shows incidental COVID-19 infection along with a partially loculated right-sided pleural effusion and he was admitted to the hospital. 9/17 thoracentesis for 220 cc fluid  Clinical Impression   Patient evaluated by Physical Therapy with no further acute PT needs identified. Patient able to ambulate 300 ft with no device on room air with sats 92-96%. PT is signing off. Thank you for this referral.     Follow Up Recommendations No PT follow up    Equipment Recommendations  None recommended by PT    Recommendations for Other Services       Precautions / Restrictions Precautions Precautions: Fall Precaution Comments: reports needs 2 TKR; rt knee sometimes pops and then ok      Mobility  Bed Mobility                  Transfers Overall transfer level: Independent Equipment used: None                Ambulation/Gait Ambulation/Gait assistance: Independent Gait Distance (Feet): 300 Feet Assistive device: None Gait Pattern/deviations: Step-through pattern;Decreased stride length;Antalgic;Wide base of support     General Gait Details: pt reports his baseline  Stairs            Wheelchair Mobility    Modified Rankin (Stroke Patients Only)       Balance Overall balance assessment: No apparent balance deficits (not formally assessed)                                           Pertinent Vitals/Pain Pain Assessment: No/denies pain    Home Living Family/patient expects to be discharged to:: Private residence Living Arrangements: Spouse/significant other Available Help at Discharge:  Family Type of Home: House Home Access: Stairs to enter Entrance Stairs-Rails: Right;Left;Can reach both Entrance Stairs-Number of Steps: 4 Home Layout: One level Home Equipment: Grab bars - tub/shower;Cane - single point;Walker - 4 wheels      Prior Function Level of Independence: Independent         Comments: almost never uses cane or walker for knee pain; still push mows his yard and ditches     Hand Dominance        Extremity/Trunk Assessment   Upper Extremity Assessment Upper Extremity Assessment: Overall WFL for tasks assessed    Lower Extremity Assessment Lower Extremity Assessment: Overall WFL for tasks assessed    Cervical / Trunk Assessment Cervical / Trunk Assessment: Normal  Communication   Communication: No difficulties  Cognition Arousal/Alertness: Awake/alert Behavior During Therapy: WFL for tasks assessed/performed Overall Cognitive Status: Within Functional Limits for tasks assessed                                        General Comments      Exercises     Assessment/Plan    PT Assessment Patent does not need any further PT services  PT Problem List         PT Treatment Interventions  PT Goals (Current goals can be found in the Care Plan section)  Acute Rehab PT Goals Patient Stated Goal: go home Monday after test PT Goal Formulation: All assessment and education complete, DC therapy    Frequency     Barriers to discharge        Co-evaluation               AM-PAC PT "6 Clicks" Mobility  Outcome Measure Help needed turning from your back to your side while in a flat bed without using bedrails?: None Help needed moving from lying on your back to sitting on the side of a flat bed without using bedrails?: None Help needed moving to and from a bed to a chair (including a wheelchair)?: None Help needed standing up from a chair using your arms (e.g., wheelchair or bedside chair)?: None Help needed to walk  in hospital room?: None Help needed climbing 3-5 steps with a railing? : None 6 Click Score: 24    End of Session   Activity Tolerance: Patient tolerated treatment well Patient left: in chair;with call bell/phone within reach   PT Visit Diagnosis: Difficulty in walking, not elsewhere classified (R26.2)    Time: 6440-3474 PT Time Calculation (min) (ACUTE ONLY): 24 min   Charges:   PT Evaluation $PT Eval Low Complexity: 1 Low           Arby Barrette, PT Pager [phone]   Rexanne Mano 03/24/2020, 4:34 PM

## 2020-03-24 NOTE — Progress Notes (Signed)
OT Cancellation Note  Patient Details Name: Mathew Hall MRN: [id number] DOB: 01-30-1941   Cancelled Treatment:    Reason Eval/Treat Not Completed: OT screened, no needs identified, will sign off (Per PT, pt at baseline for ADLs and mobility.) Pt walking 300 ft with PT maintaining SpO2 in 90s on RA. No balance deficits or fatigue. Thank you for referral.   Charlaine Utsey M Shamonique Battiste Melainie Krinsky MSOT, OTR/L Acute Rehab Pager: [phone] Office: [phone] 03/24/2020, 4:39 PM

## 2020-03-24 NOTE — Progress Notes (Signed)
PROGRESS NOTE                                                                                                                                                                                                             Patient Demographics:    Mathew Hall, is a 79 y.o. male, DOB - 10-13-1940, AQT:[id number]  Outpatient Primary MD for the patient is [name], MD    LOS - 2  Admit date - 03/21/2020    Chief Complaint  Patient presents with  . Back Pain       Brief Narrative - Mathew Hall is a 79 y.o. male with medical history significant of lung CA status post partial resection, hypertension, HLD, GERD, CAD with MI remote, presented with worsening back pain and cough.  Patient did eat grass cutting 2 days ago, and he said that usually after doing grass cutting we will have back pain for a day or 2.  ER work-up shows incidental COVID-19 infection along with a partially loculated right-sided pleural effusion and he was admitted to the hospital.   Subjective:   Patient in bed, appears comfortable, denies any headache, no fever, no chest pain or pressure, does have mild exertional shortness of breath , no abdominal pain. No focal weakness.    Assessment  & Plan :     1. Incidental COVID-19 Infection in a patient is fully vaccinated.  Although his CRP is elevated he has no hypoxia or oxygen demand at rest, upon admission he was placed on steroids and Remdesivir due to his inflammation and some exertional shortness of breath, will continue this for the time being.  Although chance of true breakthrough infection is less.  Encouraged the patient to sit up in chair in the daytime use I-S and flutter valve for pulmonary toiletry and then prone in bed when at night.  Will advance activity and titrate down oxygen as possible.   SpO2: 93 %  Recent Labs  Lab 03/21/20 2207 03/22/20 1219 03/22/20 1318 03/22/20 2106  03/23/20 0251 03/23/20 0913 03/24/20 0156  WBC 7.6  --   --   --  12.8*  --  12.8*  PLT 251  --   --   --  233  --  235  CRP  --   --   --   --  32.5*  --  26.6*  BNP  --   --   --   --   --  352.7* 344.5*  DDIMER  --   --   --  2.76* 2.86*  --  2.90*  PROCALCITON  --   --   --   --   --   --  5.21  AST 19  --   --   --  16  --  19  ALT 16  --   --   --  14  --  14  ALKPHOS 63  --   --   --  64  --  58  BILITOT 1.3*  --   --   --  1.1  --  1.0  ALBUMIN 3.4*  --   --   --  2.9*  --  2.5*  LATICACIDVEN  --  1.2  --   --   --   --   --   SARSCOV2NAA  --   --  POSITIVE*  --   --   --   --     2.  History of  Metastatic nonsmall cell lung cancer, adenocarcinoma Right Lung cancer in the past ( 15 yrs ago, Dr Inda Merlin)  With status post neoadjuvant chemotherapy followed by right middle lobectomy with lymph node dissection  &  now with partially loculated right-sided pleural effusion.  Could be causing his symptoms and elevated CRP, he been seen by pulmonary critical care this admission and underwent ultrasound-guided thoracentesis on the right side on 03/23/2020 showing exudative fluid, cytology is pending.  Continue to monitor.  3.  Borderline elevated D-dimer.  Due to inflammation from #1 and 2.  Intermediate dose Lovenox, negative leg ultrasound.  4.  Essential hypertension.  On Norvasc, will increase beta-blocker dose for better control, as needed IV hydralazine added.  5.  History of CAD.  Chest pain-free no acute issues, on beta-blocker, aspirin and statin for secondary infection.  Continue.  6.  Dehydration.  Has been adequately hydrated with IV fluids.     Condition -   Guarded  Family Communication  : Wife Bennett [phone] on 03/23/2020  Code Status :  Full  Consults  :  Pulm  Procedures  :     Right-sided ultrasound-guided thoracentesis on 03/23/2020.  Exudative fluid.  Cytology pending.    Lower extremity venous duplex.  No DVT.    CT - No evidence of pulmonary  embolism. Partially loculated RIGHT pleural effusion with compressive atelectasis of the RIGHT lower lobe. Prior RIGHT middle lobe resection. Single minimally enlarged precarinal lymph node 11 mm short axis. No acute intra-abdominal or intrapelvic abnormalities. Prostatic enlargement. BILATERAL inguinal hernias containing fat larger on RIGHT. Cholelithiasis. Aortic Atherosclerosis (ICD10-I70.0) and Emphysema (ICD10-J43.9).  PUD Prophylaxis : PPI  Disposition Plan  :    Status is: Inpatient  Remains inpatient appropriate because:IV treatments appropriate due to intensity of illness or inability to take PO   Dispo: The patient is from: Home              Anticipated d/c is to: Home              Anticipated d/c date is: 3 days              Patient currently is not medically stable to d/c.   DVT Prophylaxis  :  Lovenox    Lab Results  Component Value Date   PLT 235 03/24/2020    Diet :  Diet Order            Diet Heart Room service appropriate? Yes; Fluid consistency: Thin  Diet effective now                  Inpatient Medications  Scheduled Meds: . amLODipine  5 mg Oral Daily  . aspirin  325 mg Oral Daily  . enoxaparin (LOVENOX) injection  60 mg Subcutaneous Q12H  . insulin aspart  0-5 Units Subcutaneous QHS  . insulin aspart  0-9 Units Subcutaneous TID WC  . ipratropium  2 puff Inhalation Q6H  . methylPREDNISolone (SOLU-MEDROL) injection  60 mg Intravenous Q12H  . metoprolol tartrate  50 mg Oral BID  . pantoprazole  40 mg Oral Daily  . simvastatin  40 mg Oral q1800   Continuous Infusions: . remdesivir 100 mg in NS 100 mL 100 mg (03/24/20 0909)   PRN Meds:.acetaminophen, guaiFENesin-dextromethorphan, hydrALAZINE, [DISCONTINUED] ondansetron **OR** ondansetron (ZOFRAN) IV, oxybutynin  Antibiotics  :    Anti-infectives (From admission, onward)   Start     Dose/Rate Route Frequency Ordered Stop   03/23/20 1000  remdesivir 100 mg in sodium chloride 0.9 % 100 mL IVPB        "Followed by" Linked Group Details   100 mg 200 mL/hr over 30 Minutes Intravenous Daily 03/22/20 1806 03/27/20 0959   03/22/20 1845  remdesivir 200 mg in sodium chloride 0.9% 250 mL IVPB       "Followed by" Linked Group Details   200 mg 580 mL/hr over 30 Minutes Intravenous Once 03/22/20 1806 03/22/20 2025       Time Spent in minutes  30   Lala Lund M.D on 03/24/2020 at 10:48 AM  To page go to [email or web address] - password [email or web address]  [facility] -  Office  [phone]     See all Orders from today for further details    Objective:   Vitals:   03/23/20 2300 03/24/20 0004 03/24/20 0405 03/24/20 0818  BP: 115/72 (!) 155/75 (!) 146/76 (!) 143/76  Pulse: 63 66 73 75  Resp: (!) 28 (!) 22 20 20   Temp:  97.9 F (36.6 C) 97.8 F (36.6 C) 98.2 F (36.8 C)  TempSrc:  Oral Oral Oral  SpO2: 95% 92% (!) 89% 93%  Weight:  76.7 kg    Height:  5\' 10"  (1.778 m)      Wt Readings from Last 3 Encounters:  03/24/20 76.7 kg  09/02/19 78.9 kg  08/01/19 79.2 kg     Intake/Output Summary (Last 24 hours) at 03/24/2020 1048 Last data filed at 03/23/2020 1111 Gross per 24 hour  Intake 100 ml  Output --  Net 100 ml     Physical Exam  Awake Alert, No new F.N deficits, Normal affect [place].AT,PERRAL Supple Neck,No JVD, No cervical lymphadenopathy appriciated.  Symmetrical Chest wall movement, Good air movement bilaterally, CTAB RRR,No Gallops, Rubs or new Murmurs, No Parasternal Heave +ve B.Sounds, Abd Soft, No tenderness, No organomegaly appriciated, No rebound - guarding or rigidity. No Cyanosis, Clubbing or edema, No new Rash or bruise     Data Review:    CBC Recent Labs  Lab 03/21/20 2207 03/23/20 0251 03/24/20 0156  WBC 7.6 12.8* 12.8*  HGB 12.0* 11.9* 11.2*  HCT 37.6* 36.8* 34.9*  PLT 251 233 235  MCV 88.1 88.0 87.5  MCH 28.1 28.5 28.1  MCHC 31.9 32.3 32.1  RDW 13.4 13.8 14.0  LYMPHSABS  --  0.4* 0.5*  MONOABS  --  0.8  0.6  EOSABS  --  0.0 0.0  BASOSABS   --  0.0 0.0    Recent Labs  Lab 03/21/20 2207 03/22/20 1219 03/22/20 2106 03/23/20 0251 03/23/20 0903 03/23/20 0913 03/24/20 0156  NA 134*  --   --  135  --   --  136  K 3.0*  --   --  3.8  --   --  3.6  CL 103  --   --  103  --   --  103  CO2 19*  --   --  22  --   --  22  GLUCOSE 134*  --   --  138*  --   --  134*  BUN 21  --   --  17  --   --  25*  CREATININE 1.24  --   --  0.93  --   --  0.79  CALCIUM 9.2  --   --  9.1  --   --  9.0  AST 19  --   --  16  --   --  19  ALT 16  --   --  14  --   --  14  ALKPHOS 63  --   --  64  --   --  58  BILITOT 1.3*  --   --  1.1  --   --  1.0  ALBUMIN 3.4*  --   --  2.9*  --   --  2.5*  MG  --   --   --  1.7  --   --  1.9  CRP  --   --   --  32.5*  --   --  26.6*  DDIMER  --   --  2.76* 2.86*  --   --  2.90*  PROCALCITON  --   --   --   --   --   --  5.21  LATICACIDVEN  --  1.2  --   --   --   --   --   HGBA1C  --   --   --   --  5.6  --   --   BNP  --   --   --   --   --  352.7* 344.5*    ------------------------------------------------------------------------------------------------------------------ No results for input(s): CHOL, HDL, LDLCALC, TRIG, CHOLHDL, LDLDIRECT in the last 72 hours.  Lab Results  Component Value Date   HGBA1C 5.6 03/23/2020   ------------------------------------------------------------------------------------------------------------------ No results for input(s): TSH, T4TOTAL, T3FREE, THYROIDAB in the last 72 hours.  Invalid input(s): FREET3  Cardiac Enzymes No results for input(s): CKMB, TROPONINI, MYOGLOBIN in the last 168 hours.  Invalid input(s): CK ------------------------------------------------------------------------------------------------------------------    Component Value Date/Time   BNP 344.5 (H) 03/24/2020 0156    Micro Results Recent Results (from the past 240 hour(s))  SARS Coronavirus 2 by RT PCR (hospital order, performed in [facility] hospital lab) Nasopharyngeal  Nasopharyngeal Swab     Status: Abnormal   Collection Time: 03/22/20  1:18 PM   Specimen: Nasopharyngeal Swab  Result Value Ref Range Status   SARS Coronavirus 2 POSITIVE (A) NEGATIVE Final    Comment: RESULT CALLED TO, READ BACK BY AND VERIFIED WITHKeturah Barre RN 1528 03/22/20 A BROWNING (NOTE) SARS-CoV-2 target nucleic acids are DETECTED  SARS-CoV-2 RNA is generally detectable in upper respiratory specimens  during the acute phase of infection.  Positive results are indicative  of the presence of the identified  virus, but do not rule out bacterial infection or co-infection with other pathogens not detected by the test.  Clinical correlation with patient history and  other diagnostic information is necessary to determine patient infection status.  The expected result is negative.  Fact Sheet for Patients:   StrictlyIdeas.no   Fact Sheet for Healthcare Providers:   BankingDealers.co.za    This test is not yet approved or cleared by the [place] FDA and  has been authorized for detection and/or diagnosis of SARS-CoV-2 by FDA under an Emergency Use Authorization (EUA).  This EUA will remain in effect (meaning this  test can be used) for the duration of  the COVID-19 declaration under Section 564(b)(1) of the Act, 21 U.S.C. section 360-bbb-3(b)(1), unless the authorization is terminated or revoked sooner.  Performed at [facility], [place] [address], [place], [place] [address]   Body fluid culture     Status: None (Preliminary result)   Collection Time: 03/23/20  3:08 PM   Specimen: Pleura; Body Fluid  Result Value Ref Range Status   Specimen Description PLEURAL  Final   Special Requests PLEURAL R  Final   Gram Stain   Final    ABUNDANT WBC PRESENT, PREDOMINANTLY PMN NO ORGANISMS SEEN    Culture   Final    NO GROWTH < 24 HOURS Performed at [facility], [address], [place], [place] [address]    Report Status  PENDING  Incomplete    Radiology Reports DG Chest 2 View  Result Date: 03/21/2020 CLINICAL DATA:  79 year old male with cough and back pain. Former smoker. Previous right middle lobectomy. EXAM: CHEST - 2 VIEW COMPARISON:  Chest CT 07/29/2019 and earlier. FINDINGS: Right lung volume loss and architectural distortion similar to the January CT. However, with increased streaky and indistinct right lung base opacity since that time. Left lung volume is stable. No superimposed pneumothorax. No definite pleural effusion. No pulmonary edema. Mediastinal contours are stable and within normal limits. Visualized tracheal air column is within normal limits. No acute osseous abnormality identified. Negative visible bowel gas pattern. IMPRESSION: Sequelae of prior right middle lobectomy, but with superimposed new interstitial opacity in the right lower lung. Favor acute viral/atypical respiratory infection. No definite pleural effusion. Recommend Followup PA and lateral chest X-ray, or alternatively repeat restaging Chest CT, following therapy to ensure resolution and exclude recurrent malignancy. Electronically Signed   By: Genevie Ann M.D.   On: 03/21/2020 22:26   CT Angio Chest PE W and/or Wo Contrast  Result Date: 03/22/2020 CLINICAL DATA:  Abdominal pain, pleuritic RIGHT chest pain, history lung cancer post resection, possible RIGHT groin hernia, prior abnormal CT chest EXAM: CT ANGIOGRAPHY CHEST CT ABDOMEN AND PELVIS WITH CONTRAST TECHNIQUE: Multidetector CT imaging of the chest was performed using the standard protocol during bolus administration of intravenous contrast. Multiplanar CT image reconstructions and MIPs were obtained to evaluate the vascular anatomy. Multidetector CT imaging of the abdomen and pelvis was performed using the standard protocol during bolus administration of intravenous contrast. CONTRAST:  142mL OMNIPAQUE IOHEXOL 350 MG/ML SOLN IV. No oral contrast. COMPARISON:  CT chest 07/29/2019  FINDINGS: CTA CHEST FINDINGS Cardiovascular: Atherosclerotic calcifications of thoracic aorta and coronary arteries. Aorta normal caliber without aneurysm or dissection. Heart unremarkable. No pericardial effusion. Pulmonary arteries adequately opacified. Scattered respiratory motion artifacts at the lower lobes. No definite pulmonary emboli identified. Mediastinum/Nodes: Esophagus unremarkable. Base of cervical region normal appearance. 11 mm precarinal node slightly increased from previous exam. Additional  small normal size mediastinal lymph nodes without additional thoracic adenopathy. Lungs/Pleura: Partially loculated RIGHT pleural effusion. Compressive atelectasis of RIGHT lower lobe. Prior RIGHT middle lobe resection. Loculated fluid in RIGHT major fissure posteriorly. Underlying emphysematous changes. No acute infiltrate, pleural effusion or pneumothorax. Small focus of scarring in the RIGHT upper lobe unchanged image 29. Musculoskeletal: No acute osseous findings. Review of the MIP images confirms the above findings. CT ABDOMEN and PELVIS FINDINGS Hepatobiliary: Calcified gallstone dependently in gallbladder measuring 5 mm diameter. Gallbladder and liver otherwise normal appearance. Pancreas: Normal appearance Spleen: Normal appearance Adrenals/Urinary Tract: Adrenal glands kidneys, ureters, and bladder normal appearance Stomach/Bowel: Short segment of appendiceal base normal appearance. Stomach and bowel loops normal appearance. Vascular/Lymphatic: Atherosclerotic calcifications of aortic and iliac arteries without aneurysm. No adenopathy. Reproductive: Prostatic enlargement gland measuring 6.5 x 6.5 x 5.5 cm (volume = 120 cm^3) Other: BILATERAL inguinal hernias containing fat larger on RIGHT. No free air or free fluid. No inflammatory process. Tiny umbilical hernia containing fat. Musculoskeletal: Bones demineralized. Degenerative disc disease changes thoracolumbar spine. Review of the MIP images confirms  the above findings. IMPRESSION: No evidence of pulmonary embolism. Partially loculated RIGHT pleural effusion with compressive atelectasis of the RIGHT lower lobe. Prior RIGHT middle lobe resection. Single minimally enlarged precarinal lymph node 11 mm short axis. No acute intra-abdominal or intrapelvic abnormalities. Prostatic enlargement. BILATERAL inguinal hernias containing fat larger on RIGHT. Cholelithiasis. Aortic Atherosclerosis (ICD10-I70.0) and Emphysema (ICD10-J43.9). Electronically Signed   By: Lavonia Dana M.D.   On: 03/22/2020 15:03   CT ABDOMEN PELVIS W CONTRAST  Result Date: 03/22/2020 CLINICAL DATA:  Abdominal pain, pleuritic RIGHT chest pain, history lung cancer post resection, possible RIGHT groin hernia, prior abnormal CT chest EXAM: CT ANGIOGRAPHY CHEST CT ABDOMEN AND PELVIS WITH CONTRAST TECHNIQUE: Multidetector CT imaging of the chest was performed using the standard protocol during bolus administration of intravenous contrast. Multiplanar CT image reconstructions and MIPs were obtained to evaluate the vascular anatomy. Multidetector CT imaging of the abdomen and pelvis was performed using the standard protocol during bolus administration of intravenous contrast. CONTRAST:  153mL OMNIPAQUE IOHEXOL 350 MG/ML SOLN IV. No oral contrast. COMPARISON:  CT chest 07/29/2019 FINDINGS: CTA CHEST FINDINGS Cardiovascular: Atherosclerotic calcifications of thoracic aorta and coronary arteries. Aorta normal caliber without aneurysm or dissection. Heart unremarkable. No pericardial effusion. Pulmonary arteries adequately opacified. Scattered respiratory motion artifacts at the lower lobes. No definite pulmonary emboli identified. Mediastinum/Nodes: Esophagus unremarkable. Base of cervical region normal appearance. 11 mm precarinal node slightly increased from previous exam. Additional small normal size mediastinal lymph nodes without additional thoracic adenopathy. Lungs/Pleura: Partially loculated RIGHT  pleural effusion. Compressive atelectasis of RIGHT lower lobe. Prior RIGHT middle lobe resection. Loculated fluid in RIGHT major fissure posteriorly. Underlying emphysematous changes. No acute infiltrate, pleural effusion or pneumothorax. Small focus of scarring in the RIGHT upper lobe unchanged image 29. Musculoskeletal: No acute osseous findings. Review of the MIP images confirms the above findings. CT ABDOMEN and PELVIS FINDINGS Hepatobiliary: Calcified gallstone dependently in gallbladder measuring 5 mm diameter. Gallbladder and liver otherwise normal appearance. Pancreas: Normal appearance Spleen: Normal appearance Adrenals/Urinary Tract: Adrenal glands kidneys, ureters, and bladder normal appearance Stomach/Bowel: Short segment of appendiceal base normal appearance. Stomach and bowel loops normal appearance. Vascular/Lymphatic: Atherosclerotic calcifications of aortic and iliac arteries without aneurysm. No adenopathy. Reproductive: Prostatic enlargement gland measuring 6.5 x 6.5 x 5.5 cm (volume = 120 cm^3) Other: BILATERAL inguinal hernias containing fat larger on RIGHT. No free air or free fluid. No inflammatory  process. Tiny umbilical hernia containing fat. Musculoskeletal: Bones demineralized. Degenerative disc disease changes thoracolumbar spine. Review of the MIP images confirms the above findings. IMPRESSION: No evidence of pulmonary embolism. Partially loculated RIGHT pleural effusion with compressive atelectasis of the RIGHT lower lobe. Prior RIGHT middle lobe resection. Single minimally enlarged precarinal lymph node 11 mm short axis. No acute intra-abdominal or intrapelvic abnormalities. Prostatic enlargement. BILATERAL inguinal hernias containing fat larger on RIGHT. Cholelithiasis. Aortic Atherosclerosis (ICD10-I70.0) and Emphysema (ICD10-J43.9). Electronically Signed   By: Lavonia Dana M.D.   On: 03/22/2020 15:03   DG CHEST PORT 1 VIEW  Result Date: 03/23/2020 CLINICAL DATA:  Status post  thoracentesis. EXAM: PORTABLE CHEST 1 VIEW COMPARISON:  03/21/2020 and chest CT, 03/22/2020. FINDINGS: There is there is opacity at the right lung base at is similar to the previous day's CT scan, consistent with a combination of residual fluid and atelectasis. Postsurgical changes are noted with right mid to lower lung anastomosis staples in the few surgical vascular clips. No pneumothorax. Left lung is hyperexpanded but clear. IMPRESSION: 1. No pneumothorax or evidence of a complication following thoracentesis. 2. Persistent opacity at the right lung base consistent with a combination of residual pleural fluid and atelectasis. Postsurgical changes in the right lower lung are unchanged from the previous chest radiograph. Electronically Signed   By: Lajean Manes M.D.   On: 03/23/2020 14:54   VAS [place] LOWER EXTREMITY VENOUS (DVT)  Result Date: 03/23/2020  Lower Venous DVTStudy Indications: + Covid, and Swelling.  Risk Factors: None identified. Performing Technologist: Griffin Basil RCT RDMS  Examination Guidelines: A complete evaluation includes B-mode imaging, spectral Doppler, color Doppler, and power Doppler as needed of all accessible portions of each vessel. Bilateral testing is considered an integral part of a complete examination. Limited examinations for reoccurring indications may be performed as noted. The reflux portion of the exam is performed with the patient in reverse Trendelenburg.  +---------+---------------+---------+-----------+----------+--------------+ RIGHT    CompressibilityPhasicitySpontaneityPropertiesThrombus Aging +---------+---------------+---------+-----------+----------+--------------+ CFV      Full           Yes      Yes                                 +---------+---------------+---------+-----------+----------+--------------+ SFJ      Full                                                         +---------+---------------+---------+-----------+----------+--------------+ FV Prox  Full                                                        +---------+---------------+---------+-----------+----------+--------------+ FV Mid   Full                                                        +---------+---------------+---------+-----------+----------+--------------+ FV DistalFull                                                        +---------+---------------+---------+-----------+----------+--------------+  PFV      Full                                                        +---------+---------------+---------+-----------+----------+--------------+ POP      Full           Yes      Yes                                 +---------+---------------+---------+-----------+----------+--------------+ PTV      Full                                                        +---------+---------------+---------+-----------+----------+--------------+ PERO     Full                                                        +---------+---------------+---------+-----------+----------+--------------+   +---------+---------------+---------+-----------+----------+--------------+ LEFT     CompressibilityPhasicitySpontaneityPropertiesThrombus Aging +---------+---------------+---------+-----------+----------+--------------+ CFV      Full           Yes      Yes                                 +---------+---------------+---------+-----------+----------+--------------+ SFJ      Full                                                        +---------+---------------+---------+-----------+----------+--------------+ FV Prox  Full                                                        +---------+---------------+---------+-----------+----------+--------------+ FV Mid   Full                                                         +---------+---------------+---------+-----------+----------+--------------+ FV DistalFull                                                        +---------+---------------+---------+-----------+----------+--------------+ PFV      Full                                                        +---------+---------------+---------+-----------+----------+--------------+  POP      Full           Yes      Yes                                 +---------+---------------+---------+-----------+----------+--------------+ PTV      Full                                                        +---------+---------------+---------+-----------+----------+--------------+ PERO     Full                                                        +---------+---------------+---------+-----------+----------+--------------+     Summary: RIGHT: - There is no evidence of deep vein thrombosis in the lower extremity.  - No cystic structure found in the popliteal fossa.  LEFT: - There is no evidence of deep vein thrombosis in the lower extremity.  - No cystic structure found in the popliteal fossa.  *See table(s) above for measurements and observations. Electronically signed by Servando Snare MD on 03/23/2020 at 12:08:48 PM.    Final

## 2020-03-25 ENCOUNTER — Inpatient Hospital Stay (HOSPITAL_COMMUNITY): Payer: Medicare Other

## 2020-03-25 DIAGNOSIS — J869 Pyothorax without fistula: Secondary | ICD-10-CM

## 2020-03-25 LAB — CBC WITH DIFFERENTIAL/PLATELET
Abs Immature Granulocytes: 0.18 10*3/uL — ABNORMAL HIGH (ref 0.00–0.07)
Basophils Absolute: 0 10*3/uL (ref 0.0–0.1)
Basophils Relative: 0 %
Eosinophils Absolute: 0 10*3/uL (ref 0.0–0.5)
Eosinophils Relative: 0 %
HCT: 34.3 % — ABNORMAL LOW (ref 39.0–52.0)
Hemoglobin: 11.2 g/dL — ABNORMAL LOW (ref 13.0–17.0)
Immature Granulocytes: 1 %
Lymphocytes Relative: 5 %
Lymphs Abs: 0.7 10*3/uL (ref 0.7–4.0)
MCH: 28.8 pg (ref 26.0–34.0)
MCHC: 32.7 g/dL (ref 30.0–36.0)
MCV: 88.2 fL (ref 80.0–100.0)
Monocytes Absolute: 0.7 10*3/uL (ref 0.1–1.0)
Monocytes Relative: 5 %
Neutro Abs: 12.9 10*3/uL — ABNORMAL HIGH (ref 1.7–7.7)
Neutrophils Relative %: 89 %
Platelets: 245 10*3/uL (ref 150–400)
RBC: 3.89 MIL/uL — ABNORMAL LOW (ref 4.22–5.81)
RDW: 14.2 % (ref 11.5–15.5)
WBC: 14.4 10*3/uL — ABNORMAL HIGH (ref 4.0–10.5)
nRBC: 0 % (ref 0.0–0.2)

## 2020-03-25 LAB — COMPREHENSIVE METABOLIC PANEL
ALT: 19 U/L (ref 0–44)
AST: 21 U/L (ref 15–41)
Albumin: 2.4 g/dL — ABNORMAL LOW (ref 3.5–5.0)
Alkaline Phosphatase: 60 U/L (ref 38–126)
Anion gap: 9 (ref 5–15)
BUN: 28 mg/dL — ABNORMAL HIGH (ref 8–23)
CO2: 25 mmol/L (ref 22–32)
Calcium: 8.9 mg/dL (ref 8.9–10.3)
Chloride: 101 mmol/L (ref 98–111)
Creatinine, Ser: 0.92 mg/dL (ref 0.61–1.24)
GFR calc Af Amer: >60 mL/min (ref >60)
GFR calc non Af Amer: >60 mL/min (ref >60)
Glucose, Bld: 156 mg/dL — ABNORMAL HIGH (ref 70–99)
Potassium: 4.3 mmol/L (ref 3.5–5.1)
Sodium: 135 mmol/L (ref 135–145)
Total Bilirubin: 0.6 mg/dL (ref 0.3–1.2)
Total Protein: 6.4 g/dL — ABNORMAL LOW (ref 6.5–8.1)

## 2020-03-25 LAB — GLUCOSE, CAPILLARY
Glucose-Capillary: 106 mg/dL — ABNORMAL HIGH (ref 70–99)
Glucose-Capillary: 113 mg/dL — ABNORMAL HIGH (ref 70–99)
Glucose-Capillary: 159 mg/dL — ABNORMAL HIGH (ref 70–99)
Glucose-Capillary: 147 mg/dL — ABNORMAL HIGH (ref 70–99)

## 2020-03-25 LAB — BRAIN NATRIURETIC PEPTIDE: B Natriuretic Peptide: 235.3 pg/mL — ABNORMAL HIGH (ref 0.0–100.0)

## 2020-03-25 LAB — PROCALCITONIN: Procalcitonin: 2.93 ng/mL

## 2020-03-25 LAB — MAGNESIUM: Magnesium: 1.8 mg/dL (ref 1.7–2.4)

## 2020-03-25 LAB — D-DIMER, QUANTITATIVE: D-Dimer, Quant: 2.09 ug/mL-FEU — ABNORMAL HIGH (ref 0.00–0.50)

## 2020-03-25 LAB — C-REACTIVE PROTEIN: CRP: 16.9 mg/dL — ABNORMAL HIGH (ref <1.0)

## 2020-03-25 LAB — PHOSPHORUS: Phosphorus: 3.1 mg/dL (ref 2.5–4.6)

## 2020-03-25 MED ORDER — METHYLPREDNISOLONE SODIUM SUCC 40 MG IJ SOLR
20.0000 mg | Freq: Every day | INTRAMUSCULAR | Status: AC
  Administered 2020-03-25: 20 mg via INTRAVENOUS
  Filled 2020-03-25: qty 1

## 2020-03-25 NOTE — Procedures (Addendum)
Insertion of Chest Tube Procedure Note  Mathew Hall  [id number]  09/10/1940  Date:03/25/20  Time:6:17 PM    Provider Performing: Johnsie Cancel   Procedure: Pleural Catheter Insertion w/o Imaging Guidance [phone])  Indication(s) Effusion/Empyema   Consent Risks of the procedure as well as the alternatives and risks of each were explained to the patient and/or caregiver.  Verbal consent for the procedure was obtained and is signed in the bedside chart  Anesthesia Topical only with 1% lidocaine    Time Out Verified patient identification, verified procedure, site/side was marked, verified correct patient position, special equipment/implants available, medications/allergies/relevant history reviewed, required imaging and test results available.   Sterile Technique Maximal sterile technique including full sterile barrier drape, hand hygiene, sterile gown, sterile gloves, mask, hair covering, sterile ultrasound probe cover (if used).   Procedure Description Ultrasound used to identify appropriate pleural anatomy for placement and overlying skin marked. Area of placement cleaned and draped in sterile fashion.  A  pigtail pleural catheter was placed into the right pleural space using Seldinger technique. Appropriate return of fluid was obtained.  The tube was connected to atrium and placed on -20 cm H2O wall suction.     Complications/Tolerance None; patient tolerated the procedure well. Chest X-ray is ordered to verify placement.   EBL Minimal  Specimen(s) none  Johnsie Cancel, NP-C [facility] Contact / Pager information can be found on Amion  03/25/2020, 6:19 PM

## 2020-03-25 NOTE — Progress Notes (Signed)
Received call from micro that patient's pleural fluid culture came back positive for rare streptococcus intermedius. Dr. Candiss Norse notified.

## 2020-03-25 NOTE — Consult Note (Signed)
NAME:  Mathew Hall, MRN:  [id number], DOB:  11-26-1940, LOS: 3 ADMISSION DATE:  03/21/2020, CONSULTATION DATE:  9/17 REFERRING MD:  Candiss Norse, CHIEF COMPLAINT:  Back pain   Brief History   79 y/o M admitted 9/15 with back pain (posterior mid-back), found to be incidentally positive for COVID.  Work up consistent with right sided pleural effusion.   Past Medical History  CAD s/p MI  HTN HLD GERD Asthma  Metastatic Non Small Cell lung cancer, adenocarcinoma.  Diagnosed 11/2008 with RML resection in 02/2009.  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  S/P 3 cycles chemo.   Significant Hospital Events   9/15 Admit   Consults:    Procedures:  9/18 Thoracentesis> 220 cc cloudy fluid removed, right pleural space  Significant Diagnostic Tests:  CT Angio Chest 9/17 >> loculated pleural effusion in R base, very mild centrilobular emphysema in upper lobes bilaterally, Compressive atelectasis vs consolidation R base, thickening of airways in R base  Micro Data:  COVID 9/16 >> positive  Pleural culture 9/17: strep intermedius  Antimicrobials:  9/19 CTX/azithro/remdesivir >>  Interim history/subjective:  Feels okay. Little pleuritic chest pain on right.  Objective   Blood pressure 118/76, pulse 64, temperature (!) 97.3 F (36.3 C), temperature source Oral, resp. rate 19, height 5\' 10"  (1.778 m), weight 76.7 kg, SpO2 98 %.        Intake/Output Summary (Last 24 hours) at 03/25/2020 1813 Last data filed at 03/25/2020 0543 Gross per 24 hour  Intake 480 ml  Output 375 ml  Net 105 ml   Filed Weights   03/22/20 1323 03/24/20 0004  Weight: 79.4 kg 76.7 kg    Examination: General:  Resting comfortably in bed HENT: NCAT OP clear PULM: Diminished R base, normal effort CV: RRR, no mgr GI: BS+, soft, nontender MSK: normal bulk and tone Neuro: awake, alert, no distress, MAEW   Resolved Hospital Problem list      Assessment & Plan:  Empyema due to CAP/aspiration: Strep intermedius on pleural culture from [facility] 9/17. [place] with hyperechoic complicated loculated effusion. Chest tube placed for empyema 9/19 > Continue ceftriaxone and azithro, will need prolonged abx for strep empyema > f/u strep sensitivities > Chest tube to suction, suspect will require lytics > daily CXR while tube in place, one ordered for 9/20 AM > please flush tube once a shift with 20 cc saline TOWARDS patient  RML adenocarcinoma s/p resection/chemo 2010 > given history of lung cancer and this new finding will perform bronchoscopy on Monday for airway inspection and BAL  COVID 19  Agree with primary service management  Best practice:   Per Primary  Labs   CBC: Recent Labs  Lab 03/21/20 2207 03/23/20 0251 03/24/20 0156 03/25/20 0330  WBC 7.6 12.8* 12.8* 14.4*  NEUTROABS  --  11.4* 11.5* 12.9*  HGB 12.0* 11.9* 11.2* 11.2*  HCT 37.6* 36.8* 34.9* 34.3*  MCV 88.1 88.0 87.5 88.2  PLT 251 233 235 175    Basic Metabolic Panel: Recent Labs  Lab 03/21/20 2207 03/23/20 0251 03/24/20 0156 03/25/20 0330  NA 134* 135 136 135  K 3.0* 3.8 3.6 4.3  CL 103 103 103 101  CO2 19* 22 22 25   GLUCOSE 134* 138* 134* 156*  BUN 21 17 25* 28*  CREATININE 1.24 0.93 0.79 0.92  CALCIUM 9.2 9.1 9.0 8.9  MG  --  1.7 1.9 1.8  PHOS  --  2.4* 2.9 3.1  GFR: Estimated Creatinine Clearance: 68.3 mL/min (by C-G formula based on SCr of 0.92 mg/dL). Recent Labs  Lab 03/21/20 2207 03/22/20 1219 03/23/20 0251 03/24/20 0156 03/25/20 0330  PROCALCITON  --   --   --  5.21 2.93  WBC 7.6  --  12.8* 12.8* 14.4*  LATICACIDVEN  --  1.2  --   --   --     Liver Function Tests: Recent Labs  Lab 03/21/20 2207 03/23/20 0251 03/24/20 0156 03/25/20 0330  AST 19 16 19 21   ALT 16 14 14 19   ALKPHOS 63 64 58 60  BILITOT 1.3* 1.1 1.0 0.6  PROT 7.9 7.4 6.7  6.8 6.4*  ALBUMIN 3.4* 2.9* 2.5* 2.4*   Recent Labs  Lab 03/21/20 2207   LIPASE 27   No results for input(s): AMMONIA in the last 168 hours.  ABG    Component Value Date/Time   PHART 7.429 03/06/2009 0437   PCO2ART 36.0 03/06/2009 0437   PO2ART 62.0 (L) 03/06/2009 0437   HCO3 23.9 03/06/2009 0437   TCO2 25 03/06/2009 0437   ACIDBASEDEF 0.5 03/01/2009 0922   O2SAT 93.0 03/06/2009 0437     Coagulation Profile: No results for input(s): INR, PROTIME in the last 168 hours.  Cardiac Enzymes: No results for input(s): CKTOTAL, CKMB, CKMBINDEX, TROPONINI in the last 168 hours.  HbA1C: Hgb A1c MFr Bld  Date/Time Value Ref Range Status  03/23/2020 09:03 AM 5.6 4.8 - 5.6 % Final    Comment:    (NOTE) Pre diabetes:          5.7%-6.4%  Diabetes:              >6.4%  Glycemic control for   <7.0% adults with diabetes     CBG: Recent Labs  Lab 03/24/20 1640 03/24/20 2059 03/25/20 0748 03/25/20 1219 03/25/20 1609  GLUCAP 146* 145* 106* 113* 159*     Critical care time: n/a

## 2020-03-25 NOTE — Progress Notes (Signed)
PROGRESS NOTE                                                                                                                                                                                                             Patient Demographics:    Mathew Hall, is a 79 y.o. male, DOB - Oct 23, 1940, JSE:[id number]  Outpatient Primary MD for the patient is [name], MD    LOS - 3  Admit date - 03/21/2020    Chief Complaint  Patient presents with  . Back Pain       Brief Narrative - Mathew Hall is a 79 y.o. male with medical history significant of lung CA status post partial resection, hypertension, HLD, GERD, CAD with MI remote, presented with worsening back pain and cough.  Patient did eat grass cutting 2 days ago, and he said that usually after doing grass cutting we will have back pain for a day or 2.  ER work-up shows incidental COVID-19 infection along with a partially loculated right-sided pleural effusion and he was admitted to the hospital.   Subjective:   Patient in bed, appears comfortable, denies any headache, no fever, no chest pain or pressure, no shortness of breath , no abdominal pain. No focal weakness.    Assessment  & Plan :     1. Incidental COVID-19 Infection in a patient is fully vaccinated.  Although his CRP is elevated he has no hypoxia or oxygen demand at rest, upon admission he was placed on steroids (being tapered) and Remdesivir due to his inflammation and some exertional shortness of breath, will continue this for the time being.  Although chance of true breakthrough infection is less.  Encouraged the patient to sit up in chair in the daytime use I-S and flutter valve for pulmonary toiletry and then prone in bed when at night.  Will advance activity and titrate down oxygen as possible.   SpO2: 95 % O2 Flow Rate (L/min): 2 L/min  Recent Labs  Lab 03/21/20 2207 03/22/20 1219  03/22/20 1318 03/22/20 2106 03/23/20 0251 03/23/20 0913 03/24/20 0156 03/25/20 0330  WBC 7.6  --   --   --  12.8*  --  12.8* 14.4*  PLT 251  --   --   --  233  --  235 245  CRP  --   --   --   --  32.5*  --  26.6* 16.9*  BNP  --   --   --   --   --  352.7* 344.5* 235.3*  DDIMER  --   --   --  2.76* 2.86*  --  2.90* 2.09*  PROCALCITON  --   --   --   --   --   --  5.21 2.93  AST 19  --   --   --  16  --  19 21  ALT 16  --   --   --  14  --  14 19  ALKPHOS 63  --   --   --  64  --  58 60  BILITOT 1.3*  --   --   --  1.1  --  1.0 0.6  ALBUMIN 3.4*  --   --   --  2.9*  --  2.5* 2.4*  LATICACIDVEN  --  1.2  --   --   --   --   --   --   SARSCOV2NAA  --   --  POSITIVE*  --   --   --   --   --     2.  History of  Metastatic nonsmall cell lung cancer, adenocarcinoma Right Lung cancer in the past ( 15 yrs ago, Dr Inda Merlin)  With status post neoadjuvant chemotherapy followed by right middle lobectomy with lymph node dissection  &  now with partially loculated right-sided pleural effusion.  Could be causing his symptoms and elevated CRP, he been seen by pulmonary critical care this admission and underwent ultrasound-guided thoracentesis on the right side on 03/23/2020 showing exudative fluid, cytology is pending.  His procalcitonin came high and in the pleural fluid large WBC count also noted, per pulmonary this could be suggesting a silent infection like aspiration pneumonia, for now he has been placed on antibiotics.  Bronchoscopy for further evaluation also scheduled for 03/26/2020 will monitor.  3.  Borderline elevated D-dimer.  Due to inflammation from #1 and 2.  Intermediate dose Lovenox, negative leg ultrasound.  4.  Essential hypertension.  On Norvasc, will increase beta-blocker dose for better control, as needed IV hydralazine added.  5.  History of CAD.  Chest pain-free no acute issues, on beta-blocker, aspirin and statin for secondary infection.  Continue.  6.  Dehydration.  Has been  adequately hydrated with IV fluids.     Condition -   Guarded  Family Communication  : Wife Kimmons [phone] on 03/23/2020  Code Status :  Full  Consults  :  Pulm  Procedures  :     Right-sided ultrasound-guided thoracentesis on 03/23/2020.  Exudative fluid.  Cytology pending.    Lower extremity venous duplex.  No DVT.    CT - No evidence of pulmonary embolism. Partially loculated RIGHT pleural effusion with compressive atelectasis of the RIGHT lower lobe. Prior RIGHT middle lobe resection. Single minimally enlarged precarinal lymph node 11 mm short axis. No acute intra-abdominal or intrapelvic abnormalities. Prostatic enlargement. BILATERAL inguinal hernias containing fat larger on RIGHT. Cholelithiasis. Aortic Atherosclerosis (ICD10-I70.0) and Emphysema (ICD10-J43.9).  PUD Prophylaxis : PPI  Disposition Plan  :    Status is: Inpatient  Remains inpatient appropriate because:IV treatments appropriate due to intensity of illness or inability to take PO   Dispo: The patient is from: Home              Anticipated d/c is to: Home  Anticipated d/c date is: 3 days              Patient currently is not medically stable to d/c.   DVT Prophylaxis  :  Lovenox    Lab Results  Component Value Date   PLT 245 03/25/2020    Diet :  Diet Order            Diet Heart Room service appropriate? Yes; Fluid consistency: Thin  Diet effective now                  Inpatient Medications  Scheduled Meds: . amLODipine  5 mg Oral Daily  . aspirin  325 mg Oral Daily  . enoxaparin (LOVENOX) injection  60 mg Subcutaneous Q12H  . insulin aspart  0-5 Units Subcutaneous QHS  . insulin aspart  0-9 Units Subcutaneous TID WC  . ipratropium  2 puff Inhalation Q6H  . metoprolol tartrate  50 mg Oral BID  . pantoprazole  40 mg Oral Daily  . simvastatin  40 mg Oral q1800   Continuous Infusions: . azithromycin 500 mg (03/24/20 1649)  . cefTRIAXone (ROCEPHIN)  IV 2 g (03/24/20  1504)  . remdesivir 100 mg in NS 100 mL 100 mg (03/25/20 0823)   PRN Meds:.acetaminophen, guaiFENesin-dextromethorphan, hydrALAZINE, [DISCONTINUED] ondansetron **OR** ondansetron (ZOFRAN) IV, oxybutynin  Antibiotics  :    Anti-infectives (From admission, onward)   Start     Dose/Rate Route Frequency Ordered Stop   03/24/20 1500  cefTRIAXone (ROCEPHIN) 2 g in sodium chloride 0.9 % 100 mL IVPB        2 g 200 mL/hr over 30 Minutes Intravenous Every 24 hours 03/24/20 1446     03/24/20 1500  azithromycin (ZITHROMAX) 500 mg in sodium chloride 0.9 % 250 mL IVPB        500 mg 250 mL/hr over 60 Minutes Intravenous Every 24 hours 03/24/20 1446     03/23/20 1000  remdesivir 100 mg in sodium chloride 0.9 % 100 mL IVPB       "Followed by" Linked Group Details   100 mg 200 mL/hr over 30 Minutes Intravenous Daily 03/22/20 1806 03/27/20 0959   03/22/20 1845  remdesivir 200 mg in sodium chloride 0.9% 250 mL IVPB       "Followed by" Linked Group Details   200 mg 580 mL/hr over 30 Minutes Intravenous Once 03/22/20 1806 03/22/20 2025       Time Spent in minutes  30   Lala Lund M.D on 03/25/2020 at 11:08 AM  To page go to [email or web address] - password [facility]  [facility] -  Office  [phone]     See all Orders from today for further details    Objective:   Vitals:   03/24/20 1600 03/24/20 2000 03/25/20 0423 03/25/20 0815  BP: 131/81 114/75 (!) 121/58 129/83  Pulse: 67 70 65 68  Resp: 19 20 18 19   Temp: 98.7 F (37.1 C) (!) 97.5 F (36.4 C) 98 F (36.7 C) 98.1 F (36.7 C)  TempSrc: Oral Oral Oral Oral  SpO2: 96% 95% 97% 95%  Weight:      Height:        Wt Readings from Last 3 Encounters:  03/24/20 76.7 kg  09/02/19 78.9 kg  08/01/19 79.2 kg     Intake/Output Summary (Last 24 hours) at 03/25/2020 1108 Last data filed at 03/25/2020 0543 Gross per 24 hour  Intake 1116.44 ml  Output 375 ml  Net 741.44 ml  Physical Exam  Awake Alert, No new F.N deficits,  Normal affect [place].AT,PERRAL Supple Neck,No JVD, No cervical lymphadenopathy appriciated.  Symmetrical Chest wall movement, Good air movement bilaterally, CTAB RRR,No Gallops, Rubs or new Murmurs, No Parasternal Heave +ve B.Sounds, Abd Soft, No tenderness, No organomegaly appriciated, No rebound - guarding or rigidity. No Cyanosis, Clubbing or edema, No new Rash or bruise    Data Review:    CBC Recent Labs  Lab 03/21/20 2207 03/23/20 0251 03/24/20 0156 03/25/20 0330  WBC 7.6 12.8* 12.8* 14.4*  HGB 12.0* 11.9* 11.2* 11.2*  HCT 37.6* 36.8* 34.9* 34.3*  PLT 251 233 235 245  MCV 88.1 88.0 87.5 88.2  MCH 28.1 28.5 28.1 28.8  MCHC 31.9 32.3 32.1 32.7  RDW 13.4 13.8 14.0 14.2  LYMPHSABS  --  0.4* 0.5* 0.7  MONOABS  --  0.8 0.6 0.7  EOSABS  --  0.0 0.0 0.0  BASOSABS  --  0.0 0.0 0.0    Recent Labs  Lab 03/21/20 2207 03/22/20 1219 03/22/20 2106 03/23/20 0251 03/23/20 0903 03/23/20 0913 03/24/20 0156 03/25/20 0330  NA 134*  --   --  135  --   --  136 135  K 3.0*  --   --  3.8  --   --  3.6 4.3  CL 103  --   --  103  --   --  103 101  CO2 19*  --   --  22  --   --  22 25  GLUCOSE 134*  --   --  138*  --   --  134* 156*  BUN 21  --   --  17  --   --  25* 28*  CREATININE 1.24  --   --  0.93  --   --  0.79 0.92  CALCIUM 9.2  --   --  9.1  --   --  9.0 8.9  AST 19  --   --  16  --   --  19 21  ALT 16  --   --  14  --   --  14 19  ALKPHOS 63  --   --  64  --   --  58 60  BILITOT 1.3*  --   --  1.1  --   --  1.0 0.6  ALBUMIN 3.4*  --   --  2.9*  --   --  2.5* 2.4*  MG  --   --   --  1.7  --   --  1.9 1.8  CRP  --   --   --  32.5*  --   --  26.6* 16.9*  DDIMER  --   --  2.76* 2.86*  --   --  2.90* 2.09*  PROCALCITON  --   --   --   --   --   --  5.21 2.93  LATICACIDVEN  --  1.2  --   --   --   --   --   --   HGBA1C  --   --   --   --  5.6  --   --   --   BNP  --   --   --   --   --  352.7* 344.5* 235.3*     ------------------------------------------------------------------------------------------------------------------ No results for input(s): CHOL, HDL, LDLCALC, TRIG, CHOLHDL, LDLDIRECT in the last 72 hours.  Lab Results  Component Value Date   HGBA1C  5.6 03/23/2020   ------------------------------------------------------------------------------------------------------------------ No results for input(s): TSH, T4TOTAL, T3FREE, THYROIDAB in the last 72 hours.  Invalid input(s): FREET3  Cardiac Enzymes No results for input(s): CKMB, TROPONINI, MYOGLOBIN in the last 168 hours.  Invalid input(s): CK ------------------------------------------------------------------------------------------------------------------    Component Value Date/Time   BNP 235.3 (H) 03/25/2020 0330    Micro Results Recent Results (from the past 240 hour(s))  SARS Coronavirus 2 by RT PCR (hospital order, performed in [facility] hospital lab) Nasopharyngeal Nasopharyngeal Swab     Status: Abnormal   Collection Time: 03/22/20  1:18 PM   Specimen: Nasopharyngeal Swab  Result Value Ref Range Status   SARS Coronavirus 2 POSITIVE (A) NEGATIVE Final    Comment: RESULT CALLED TO, READ BACK BY AND VERIFIED WITHKeturah Barre RN 1528 03/22/20 A BROWNING (NOTE) SARS-CoV-2 target nucleic acids are DETECTED  SARS-CoV-2 RNA is generally detectable in upper respiratory specimens  during the acute phase of infection.  Positive results are indicative  of the presence of the identified virus, but do not rule out bacterial infection or co-infection with other pathogens not detected by the test.  Clinical correlation with patient history and  other diagnostic information is necessary to determine patient infection status.  The expected result is negative.  Fact Sheet for Patients:   StrictlyIdeas.no   Fact Sheet for Healthcare Providers:   BankingDealers.co.za    This test  is not yet approved or cleared by the [place] FDA and  has been authorized for detection and/or diagnosis of SARS-CoV-2 by FDA under an Emergency Use Authorization (EUA).  This EUA will remain in effect (meaning this  test can be used) for the duration of  the COVID-19 declaration under Section 564(b)(1) of the Act, 21 U.S.C. section 360-bbb-3(b)(1), unless the authorization is terminated or revoked sooner.  Performed at [facility], [place] [address] [place], [place] [address]   Body fluid culture     Status: None (Preliminary result)   Collection Time: 03/23/20  3:08 PM   Specimen: Pleura; Body Fluid  Result Value Ref Range Status   Specimen Description PLEURAL  Final   Special Requests PLEURAL R  Final   Gram Stain   Final    ABUNDANT WBC PRESENT, PREDOMINANTLY PMN NO ORGANISMS SEEN    Culture   Final    CULTURE REINCUBATED FOR BETTER GROWTH Performed at [facility], [address] [place], [place] [address]    Report Status PENDING  Incomplete    Radiology Reports DG Chest 2 View  Result Date: 03/21/2020 CLINICAL DATA:  79 year old male with cough and back pain. Former smoker. Previous right middle lobectomy. EXAM: CHEST - 2 VIEW COMPARISON:  Chest CT 07/29/2019 and earlier. FINDINGS: Right lung volume loss and architectural distortion similar to the January CT. However, with increased streaky and indistinct right lung base opacity since that time. Left lung volume is stable. No superimposed pneumothorax. No definite pleural effusion. No pulmonary edema. Mediastinal contours are stable and within normal limits. Visualized tracheal air column is within normal limits. No acute osseous abnormality identified. Negative visible bowel gas pattern. IMPRESSION: Sequelae of prior right middle lobectomy, but with superimposed new interstitial opacity in the right lower lung. Favor acute viral/atypical respiratory infection. No definite pleural effusion. Recommend Followup  PA and lateral chest X-ray, or alternatively repeat restaging Chest CT, following therapy to ensure resolution and exclude recurrent malignancy. Electronically Signed   By: Genevie Ann M.D.   On: 03/21/2020 22:26  CT Angio Chest PE W and/or Wo Contrast  Result Date: 03/22/2020 CLINICAL DATA:  Abdominal pain, pleuritic RIGHT chest pain, history lung cancer post resection, possible RIGHT groin hernia, prior abnormal CT chest EXAM: CT ANGIOGRAPHY CHEST CT ABDOMEN AND PELVIS WITH CONTRAST TECHNIQUE: Multidetector CT imaging of the chest was performed using the standard protocol during bolus administration of intravenous contrast. Multiplanar CT image reconstructions and MIPs were obtained to evaluate the vascular anatomy. Multidetector CT imaging of the abdomen and pelvis was performed using the standard protocol during bolus administration of intravenous contrast. CONTRAST:  135mL OMNIPAQUE IOHEXOL 350 MG/ML SOLN IV. No oral contrast. COMPARISON:  CT chest 07/29/2019 FINDINGS: CTA CHEST FINDINGS Cardiovascular: Atherosclerotic calcifications of thoracic aorta and coronary arteries. Aorta normal caliber without aneurysm or dissection. Heart unremarkable. No pericardial effusion. Pulmonary arteries adequately opacified. Scattered respiratory motion artifacts at the lower lobes. No definite pulmonary emboli identified. Mediastinum/Nodes: Esophagus unremarkable. Base of cervical region normal appearance. 11 mm precarinal node slightly increased from previous exam. Additional small normal size mediastinal lymph nodes without additional thoracic adenopathy. Lungs/Pleura: Partially loculated RIGHT pleural effusion. Compressive atelectasis of RIGHT lower lobe. Prior RIGHT middle lobe resection. Loculated fluid in RIGHT major fissure posteriorly. Underlying emphysematous changes. No acute infiltrate, pleural effusion or pneumothorax. Small focus of scarring in the RIGHT upper lobe unchanged image 29. Musculoskeletal: No acute  osseous findings. Review of the MIP images confirms the above findings. CT ABDOMEN and PELVIS FINDINGS Hepatobiliary: Calcified gallstone dependently in gallbladder measuring 5 mm diameter. Gallbladder and liver otherwise normal appearance. Pancreas: Normal appearance Spleen: Normal appearance Adrenals/Urinary Tract: Adrenal glands kidneys, ureters, and bladder normal appearance Stomach/Bowel: Short segment of appendiceal base normal appearance. Stomach and bowel loops normal appearance. Vascular/Lymphatic: Atherosclerotic calcifications of aortic and iliac arteries without aneurysm. No adenopathy. Reproductive: Prostatic enlargement gland measuring 6.5 x 6.5 x 5.5 cm (volume = 120 cm^3) Other: BILATERAL inguinal hernias containing fat larger on RIGHT. No free air or free fluid. No inflammatory process. Tiny umbilical hernia containing fat. Musculoskeletal: Bones demineralized. Degenerative disc disease changes thoracolumbar spine. Review of the MIP images confirms the above findings. IMPRESSION: No evidence of pulmonary embolism. Partially loculated RIGHT pleural effusion with compressive atelectasis of the RIGHT lower lobe. Prior RIGHT middle lobe resection. Single minimally enlarged precarinal lymph node 11 mm short axis. No acute intra-abdominal or intrapelvic abnormalities. Prostatic enlargement. BILATERAL inguinal hernias containing fat larger on RIGHT. Cholelithiasis. Aortic Atherosclerosis (ICD10-I70.0) and Emphysema (ICD10-J43.9). Electronically Signed   By: Lavonia Dana M.D.   On: 03/22/2020 15:03   CT ABDOMEN PELVIS W CONTRAST  Result Date: 03/22/2020 CLINICAL DATA:  Abdominal pain, pleuritic RIGHT chest pain, history lung cancer post resection, possible RIGHT groin hernia, prior abnormal CT chest EXAM: CT ANGIOGRAPHY CHEST CT ABDOMEN AND PELVIS WITH CONTRAST TECHNIQUE: Multidetector CT imaging of the chest was performed using the standard protocol during bolus administration of intravenous contrast.  Multiplanar CT image reconstructions and MIPs were obtained to evaluate the vascular anatomy. Multidetector CT imaging of the abdomen and pelvis was performed using the standard protocol during bolus administration of intravenous contrast. CONTRAST:  185mL OMNIPAQUE IOHEXOL 350 MG/ML SOLN IV. No oral contrast. COMPARISON:  CT chest 07/29/2019 FINDINGS: CTA CHEST FINDINGS Cardiovascular: Atherosclerotic calcifications of thoracic aorta and coronary arteries. Aorta normal caliber without aneurysm or dissection. Heart unremarkable. No pericardial effusion. Pulmonary arteries adequately opacified. Scattered respiratory motion artifacts at the lower lobes. No definite pulmonary emboli identified. Mediastinum/Nodes: Esophagus unremarkable. Base of cervical region normal appearance. [place]  mm precarinal node slightly increased from previous exam. Additional small normal size mediastinal lymph nodes without additional thoracic adenopathy. Lungs/Pleura: Partially loculated RIGHT pleural effusion. Compressive atelectasis of RIGHT lower lobe. Prior RIGHT middle lobe resection. Loculated fluid in RIGHT major fissure posteriorly. Underlying emphysematous changes. No acute infiltrate, pleural effusion or pneumothorax. Small focus of scarring in the RIGHT upper lobe unchanged image 29. Musculoskeletal: No acute osseous findings. Review of the MIP images confirms the above findings. CT ABDOMEN and PELVIS FINDINGS Hepatobiliary: Calcified gallstone dependently in gallbladder measuring 5 mm diameter. Gallbladder and liver otherwise normal appearance. Pancreas: Normal appearance Spleen: Normal appearance Adrenals/Urinary Tract: Adrenal glands kidneys, ureters, and bladder normal appearance Stomach/Bowel: Short segment of appendiceal base normal appearance. Stomach and bowel loops normal appearance. Vascular/Lymphatic: Atherosclerotic calcifications of aortic and iliac arteries without aneurysm. No adenopathy. Reproductive: Prostatic  enlargement gland measuring 6.5 x 6.5 x 5.5 cm (volume = 120 cm^3) Other: BILATERAL inguinal hernias containing fat larger on RIGHT. No free air or free fluid. No inflammatory process. Tiny umbilical hernia containing fat. Musculoskeletal: Bones demineralized. Degenerative disc disease changes thoracolumbar spine. Review of the MIP images confirms the above findings. IMPRESSION: No evidence of pulmonary embolism. Partially loculated RIGHT pleural effusion with compressive atelectasis of the RIGHT lower lobe. Prior RIGHT middle lobe resection. Single minimally enlarged precarinal lymph node 11 mm short axis. No acute intra-abdominal or intrapelvic abnormalities. Prostatic enlargement. BILATERAL inguinal hernias containing fat larger on RIGHT. Cholelithiasis. Aortic Atherosclerosis (ICD10-I70.0) and Emphysema (ICD10-J43.9). Electronically Signed   By: Lavonia Dana M.D.   On: 03/22/2020 15:03   DG CHEST PORT 1 VIEW  Result Date: 03/23/2020 CLINICAL DATA:  Status post thoracentesis. EXAM: PORTABLE CHEST 1 VIEW COMPARISON:  03/21/2020 and chest CT, 03/22/2020. FINDINGS: There is there is opacity at the right lung base at is similar to the previous day's CT scan, consistent with a combination of residual fluid and atelectasis. Postsurgical changes are noted with right mid to lower lung anastomosis staples in the few surgical vascular clips. No pneumothorax. Left lung is hyperexpanded but clear. IMPRESSION: 1. No pneumothorax or evidence of a complication following thoracentesis. 2. Persistent opacity at the right lung base consistent with a combination of residual pleural fluid and atelectasis. Postsurgical changes in the right lower lung are unchanged from the previous chest radiograph. Electronically Signed   By: Lajean Manes M.D.   On: 03/23/2020 14:54   VAS [place] LOWER EXTREMITY VENOUS (DVT)  Result Date: 03/23/2020  Lower Venous DVTStudy Indications: + Covid, and Swelling.  Risk Factors: None identified.  Performing Technologist: Griffin Basil RCT RDMS  Examination Guidelines: A complete evaluation includes B-mode imaging, spectral Doppler, color Doppler, and power Doppler as needed of all accessible portions of each vessel. Bilateral testing is considered an integral part of a complete examination. Limited examinations for reoccurring indications may be performed as noted. The reflux portion of the exam is performed with the patient in reverse Trendelenburg.  +---------+---------------+---------+-----------+----------+--------------+ RIGHT    CompressibilityPhasicitySpontaneityPropertiesThrombus Aging +---------+---------------+---------+-----------+----------+--------------+ CFV      Full           Yes      Yes                                 +---------+---------------+---------+-----------+----------+--------------+ SFJ      Full                                                        +---------+---------------+---------+-----------+----------+--------------+  FV Prox  Full                                                        +---------+---------------+---------+-----------+----------+--------------+ FV Mid   Full                                                        +---------+---------------+---------+-----------+----------+--------------+ FV DistalFull                                                        +---------+---------------+---------+-----------+----------+--------------+ PFV      Full                                                        +---------+---------------+---------+-----------+----------+--------------+ POP      Full           Yes      Yes                                 +---------+---------------+---------+-----------+----------+--------------+ PTV      Full                                                        +---------+---------------+---------+-----------+----------+--------------+ PERO     Full                                                         +---------+---------------+---------+-----------+----------+--------------+   +---------+---------------+---------+-----------+----------+--------------+ LEFT     CompressibilityPhasicitySpontaneityPropertiesThrombus Aging +---------+---------------+---------+-----------+----------+--------------+ CFV      Full           Yes      Yes                                 +---------+---------------+---------+-----------+----------+--------------+ SFJ      Full                                                        +---------+---------------+---------+-----------+----------+--------------+ FV Prox  Full                                                        +---------+---------------+---------+-----------+----------+--------------+  FV Mid   Full                                                        +---------+---------------+---------+-----------+----------+--------------+ FV DistalFull                                                        +---------+---------------+---------+-----------+----------+--------------+ PFV      Full                                                        +---------+---------------+---------+-----------+----------+--------------+ POP      Full           Yes      Yes                                 +---------+---------------+---------+-----------+----------+--------------+ PTV      Full                                                        +---------+---------------+---------+-----------+----------+--------------+ PERO     Full                                                        +---------+---------------+---------+-----------+----------+--------------+     Summary: RIGHT: - There is no evidence of deep vein thrombosis in the lower extremity.  - No cystic structure found in the popliteal fossa.  LEFT: - There is no evidence of deep vein thrombosis in the lower extremity.  - No cystic  structure found in the popliteal fossa.  *See table(s) above for measurements and observations. Electronically signed by Servando Snare MD on 03/23/2020 at 12:08:48 PM.    Final

## 2020-03-25 NOTE — Plan of Care (Signed)

## 2020-03-26 ENCOUNTER — Encounter (HOSPITAL_COMMUNITY)
Admission: EM | Disposition: A | Discharge status: Home / Self Care | Payer: Self-pay | Source: Home / Self Care | Attending: Internal Medicine

## 2020-03-26 ENCOUNTER — Inpatient Hospital Stay (HOSPITAL_COMMUNITY): Payer: Medicare Other

## 2020-03-26 DIAGNOSIS — J869 Pyothorax without fistula: Secondary | ICD-10-CM

## 2020-03-26 LAB — COMPREHENSIVE METABOLIC PANEL
ALT: 25 U/L (ref 0–44)
AST: 21 U/L (ref 15–41)
Albumin: 2.3 g/dL — ABNORMAL LOW (ref 3.5–5.0)
Alkaline Phosphatase: 54 U/L (ref 38–126)
Anion gap: 7 (ref 5–15)
BUN: 26 mg/dL — ABNORMAL HIGH (ref 8–23)
CO2: 25 mmol/L (ref 22–32)
Calcium: 8.8 mg/dL — ABNORMAL LOW (ref 8.9–10.3)
Chloride: 104 mmol/L (ref 98–111)
Creatinine, Ser: 0.97 mg/dL (ref 0.61–1.24)
GFR calc Af Amer: >60 mL/min (ref >60)
GFR calc non Af Amer: >60 mL/min (ref >60)
Glucose, Bld: 130 mg/dL — ABNORMAL HIGH (ref 70–99)
Potassium: 4 mmol/L (ref 3.5–5.1)
Sodium: 136 mmol/L (ref 135–145)
Total Bilirubin: 0.6 mg/dL (ref 0.3–1.2)
Total Protein: 6.3 g/dL — ABNORMAL LOW (ref 6.5–8.1)

## 2020-03-26 LAB — CBC WITH DIFFERENTIAL/PLATELET
Abs Immature Granulocytes: 0.07 10*3/uL (ref 0.00–0.07)
Basophils Absolute: 0 10*3/uL (ref 0.0–0.1)
Basophils Relative: 0 %
Eosinophils Absolute: 0 10*3/uL (ref 0.0–0.5)
Eosinophils Relative: 0 %
HCT: 33.9 % — ABNORMAL LOW (ref 39.0–52.0)
Hemoglobin: 11 g/dL — ABNORMAL LOW (ref 13.0–17.0)
Immature Granulocytes: 1 %
Lymphocytes Relative: 8 %
Lymphs Abs: 0.9 10*3/uL (ref 0.7–4.0)
MCH: 28.6 pg (ref 26.0–34.0)
MCHC: 32.4 g/dL (ref 30.0–36.0)
MCV: 88.3 fL (ref 80.0–100.0)
Monocytes Absolute: 0.9 10*3/uL (ref 0.1–1.0)
Monocytes Relative: 8 %
Neutro Abs: 9.4 10*3/uL — ABNORMAL HIGH (ref 1.7–7.7)
Neutrophils Relative %: 83 %
Platelets: 273 10*3/uL (ref 150–400)
RBC: 3.84 MIL/uL — ABNORMAL LOW (ref 4.22–5.81)
RDW: 14.2 % (ref 11.5–15.5)
WBC: 11.2 10*3/uL — ABNORMAL HIGH (ref 4.0–10.5)
nRBC: 0 % (ref 0.0–0.2)

## 2020-03-26 LAB — GLUCOSE, CAPILLARY
Glucose-Capillary: 103 mg/dL — ABNORMAL HIGH (ref 70–99)
Glucose-Capillary: 102 mg/dL — ABNORMAL HIGH (ref 70–99)
Glucose-Capillary: 112 mg/dL — ABNORMAL HIGH (ref 70–99)
Glucose-Capillary: 143 mg/dL — ABNORMAL HIGH (ref 70–99)

## 2020-03-26 LAB — D-DIMER, QUANTITATIVE: D-Dimer, Quant: 2.17 ug/mL-FEU — ABNORMAL HIGH (ref 0.00–0.50)

## 2020-03-26 LAB — PHOSPHORUS: Phosphorus: 3.5 mg/dL (ref 2.5–4.6)

## 2020-03-26 LAB — BRAIN NATRIURETIC PEPTIDE: B Natriuretic Peptide: 240.7 pg/mL — ABNORMAL HIGH (ref 0.0–100.0)

## 2020-03-26 LAB — MAGNESIUM: Magnesium: 1.9 mg/dL (ref 1.7–2.4)

## 2020-03-26 LAB — PROCALCITONIN: Procalcitonin: 1.18 ng/mL

## 2020-03-26 LAB — C-REACTIVE PROTEIN: CRP: 10.2 mg/dL — ABNORMAL HIGH (ref <1.0)

## 2020-03-26 SURGERY — VIDEO BRONCHOSCOPY WITHOUT FLUORO
Anesthesia: Moderate Sedation

## 2020-03-26 NOTE — Progress Notes (Signed)
[facility]  Given empyema will reschedule his bronchoscopy planned for this afternoon.  I will come up to see him later today to discuss.  Roselie Awkward, MD [facility] Pager: [phone] Cell: [phone] If no response, call [phone]

## 2020-03-26 NOTE — Progress Notes (Signed)
PROGRESS NOTE                                                                                                                                                                                                             Patient Demographics:    Mathew Hall, is a 79 y.o. male, DOB - [birth year], YYT:[id number]  Outpatient Primary MD for the patient is Mathew Battles, MD    LOS - 4  Admit date - 03/21/2020    Chief Complaint  Patient presents with  . Back Pain       Brief Narrative - Mathew Hall is a 79 y.o. male with medical history significant of lung CA status post partial resection, hypertension, HLD, GERD, CAD with MI remote, presented with worsening back pain and cough.  Patient did eat grass cutting 2 days ago, and he said that usually after doing grass cutting we will have back pain for a day or 2.  ER work-up shows incidental COVID-19 infection along with a partially loculated right-sided pleural effusion and he was admitted to the hospital.   Subjective:   Patient in bed, appears comfortable, denies any headache, no fever, no chest pain or pressure, no shortness of breath , no abdominal pain. No focal weakness.   Assessment  & Plan :     1. Incidental COVID-19 Infection in a patient is fully vaccinated.  Although his CRP is elevated he has no hypoxia or oxygen demand at rest, upon admission he was placed on steroids (tapered off) and Remdesivir due to his inflammation and some exertional shortness of breath, will continue this for the time being.  Although chance of true breakthrough infection is less.  Encouraged the patient to sit up in chair in the daytime use I-S and flutter valve for pulmonary toiletry and then prone in bed when at night.  Will advance activity and titrate down oxygen as possible.   SpO2: 98 % O2 Flow Rate (L/min): 2 L/min  Recent Labs  Lab 03/21/20 2207 03/22/20 1219 03/22/20 1318  03/22/20 2106 03/23/20 0251 03/23/20 0913 03/24/20 0156 03/25/20 0330 03/26/20 0344  WBC 7.6  --   --   --  12.8*  --  12.8* 14.4* 11.2*  PLT 251  --   --   --  233  --  235 245 273  CRP  --   --   --   --  32.5*  --  26.6* 16.9* 10.2*  BNP  --   --   --   --   --  352.7* 344.5* 235.3* 240.7*  DDIMER  --   --   --  2.76* 2.86*  --  2.90* 2.09* 2.17*  PROCALCITON  --   --   --   --   --   --  5.21 2.93 1.18  AST 19  --   --   --  16  --  19 21 21   ALT 16  --   --   --  14  --  14 19 25   ALKPHOS 63  --   --   --  64  --  58 60 54  BILITOT 1.3*  --   --   --  1.1  --  1.0 0.6 0.6  ALBUMIN 3.4*  --   --   --  2.9*  --  2.5* 2.4* 2.3*  LATICACIDVEN  --  1.2  --   --   --   --   --   --   --   SARSCOV2NAA  --   --  POSITIVE*  --   --   --   --   --   --     2.  History of  Metastatic nonsmall cell lung cancer, adenocarcinoma Right Lung cancer in the past ( 15 yrs ago, Dr Inda Merlin)  With status post neoadjuvant chemotherapy followed by right middle lobectomy with lymph node dissection  &  now with partially loculated right-sided pleural effusion.  Did have elevated CRP along with procalcitonin, pleural fluid was exudative and follow studies and clinical evaluation by pulmonary critical care suggested that he likely had empyema versus parapneumonic effusion, underwent chest tube placement on the right side on 03/25/2020, continue empiric antibiotics, prelim cultures from the pleural fluid growing Streptococcus intermedius.  We will continue to monitor.  3.  Borderline elevated D-dimer.  Due to inflammation from #1 and 2.  Intermediate dose Lovenox, negative leg ultrasound.  4.  Essential hypertension.  On Norvasc, will increase beta-blocker dose for better control, as needed IV hydralazine added.  5.  History of CAD.  Chest pain-free no acute issues, on beta-blocker, aspirin and statin for secondary infection.  Continue.  6.  Dehydration.  Has been adequately hydrated with IV fluids.  7.   Right-sided parapneumonic effusion/empyema question silent aspiration.  Speech eval.     Condition -   Guarded  Family Communication  : Wife Mathew Hall [phone] on 03/23/2020  Code Status :  Full  Consults  :  Pulm  Procedures  :     Right-sided ultrasound-guided thoracentesis on 03/23/2020.  Exudative fluid.  Cytology pending.    Lower extremity venous duplex.  No DVT.    CT - No evidence of pulmonary embolism. Partially loculated RIGHT pleural effusion with compressive atelectasis of the RIGHT lower lobe. Prior RIGHT middle lobe resection. Single minimally enlarged precarinal lymph node 11 mm short axis. No acute intra-abdominal or intrapelvic abnormalities. Prostatic enlargement. BILATERAL inguinal hernias containing fat larger on RIGHT. Cholelithiasis. Aortic Atherosclerosis (ICD10-I70.0) and Emphysema (ICD10-J43.9).  PUD Prophylaxis : PPI  Disposition Plan  :    Status is: Inpatient  Remains inpatient appropriate because:IV treatments appropriate due to intensity of illness or inability to take PO   Dispo: The patient is from: Home              Anticipated d/c is to: Home  Anticipated d/c date is: 3 days              Patient currently is not medically stable to d/c.   DVT Prophylaxis  :  Lovenox    Lab Results  Component Value Date   PLT 273 03/26/2020    Diet :  Diet Order            Diet regular Room service appropriate? Yes; Fluid consistency: Thin  Diet effective now                  Inpatient Medications  Scheduled Meds: . amLODipine  5 mg Oral Daily  . aspirin  325 mg Oral Daily  . enoxaparin (LOVENOX) injection  60 mg Subcutaneous Q12H  . insulin aspart  0-5 Units Subcutaneous QHS  . insulin aspart  0-9 Units Subcutaneous TID WC  . ipratropium  2 puff Inhalation Q6H  . metoprolol tartrate  50 mg Oral BID  . pantoprazole  40 mg Oral Daily  . simvastatin  40 mg Oral q1800   Continuous Infusions: . azithromycin Stopped (03/25/20  2352)  . cefTRIAXone (ROCEPHIN)  IV Stopped (03/25/20 2200)   PRN Meds:.acetaminophen, guaiFENesin-dextromethorphan, hydrALAZINE, [DISCONTINUED] ondansetron **OR** ondansetron (ZOFRAN) IV, oxybutynin  Antibiotics  :    Anti-infectives (From admission, onward)   Start     Dose/Rate Route Frequency Ordered Stop   03/24/20 1500  cefTRIAXone (ROCEPHIN) 2 g in sodium chloride 0.9 % 100 mL IVPB        2 g 200 mL/hr over 30 Minutes Intravenous Every 24 hours 03/24/20 1446     03/24/20 1500  azithromycin (ZITHROMAX) 500 mg in sodium chloride 0.9 % 250 mL IVPB        500 mg 250 mL/hr over 60 Minutes Intravenous Every 24 hours 03/24/20 1446     03/23/20 1000  remdesivir 100 mg in sodium chloride 0.9 % 100 mL IVPB       "Followed by" Linked Group Details   100 mg 200 mL/hr over 30 Minutes Intravenous Daily 03/22/20 1806 03/26/20 1009   03/22/20 1845  remdesivir 200 mg in sodium chloride 0.9% 250 mL IVPB       "Followed by" Linked Group Details   200 mg 580 mL/hr over 30 Minutes Intravenous Once 03/22/20 1806 03/22/20 2025       Time Spent in minutes  30   Lala Lund M.D on 03/26/2020 at 11:19 AM  To page go to [email or web address] - password [facility]  [facility] -  Office  [phone]     See all Orders from today for further details    Objective:   Vitals:   03/25/20 0815 03/25/20 1603 03/25/20 2059 03/26/20 0443  BP: 129/83 118/76 135/83 123/64  Pulse: 68 64 (!) 55 66  Resp: 19  20 17   Temp: 98.1 F (36.7 C) (!) 97.3 F (36.3 C) 98.3 F (36.8 C) 98.3 F (36.8 C)  TempSrc: Oral Oral Oral Oral  SpO2: 95% 98% 98% 98%  Weight:      Height:        Wt Readings from Last 3 Encounters:  03/24/20 76.7 kg  09/02/19 78.9 kg  08/01/19 79.2 kg     Intake/Output Summary (Last 24 hours) at 03/26/2020 1119 Last data filed at 03/26/2020 0719 Gross per 24 hour  Intake 120 ml  Output 1120 ml  Net -1000 ml     Physical Exam  Awake Alert, No new F.N deficits, Normal  affect [place].AT,PERRAL Supple Neck,No  JVD, No cervical lymphadenopathy appriciated.  Symmetrical Chest wall movement, Good air movement bilaterally, right-sided chest tube in place RRR,No Gallops, Rubs or new Murmurs, No Parasternal Heave +ve B.Sounds, Abd Soft, No tenderness, No organomegaly appriciated, No rebound - guarding or rigidity. No Cyanosis, Clubbing or edema, No new Rash or bruise     Data Review:    CBC Recent Labs  Lab 03/21/20 2207 03/23/20 0251 03/24/20 0156 03/25/20 0330 03/26/20 0344  WBC 7.6 12.8* 12.8* 14.4* 11.2*  HGB 12.0* 11.9* 11.2* 11.2* 11.0*  HCT 37.6* 36.8* 34.9* 34.3* 33.9*  PLT 251 233 235 245 273  MCV 88.1 88.0 87.5 88.2 88.3  MCH 28.1 28.5 28.1 28.8 28.6  MCHC 31.9 32.3 32.1 32.7 32.4  RDW 13.4 13.8 14.0 14.2 14.2  LYMPHSABS  --  0.4* 0.5* 0.7 0.9  MONOABS  --  0.8 0.6 0.7 0.9  EOSABS  --  0.0 0.0 0.0 0.0  BASOSABS  --  0.0 0.0 0.0 0.0    Recent Labs  Lab 03/21/20 2207 03/22/20 1219 03/22/20 2106 03/23/20 0251 03/23/20 0903 03/23/20 0913 03/24/20 0156 03/25/20 0330 03/26/20 0344  NA 134*  --   --  135  --   --  136 135 136  K 3.0*  --   --  3.8  --   --  3.6 4.3 4.0  CL 103  --   --  103  --   --  103 101 104  CO2 19*  --   --  22  --   --  22 25 25   GLUCOSE 134*  --   --  138*  --   --  134* 156* 130*  BUN 21  --   --  17  --   --  25* 28* 26*  CREATININE 1.24  --   --  0.93  --   --  0.79 0.92 0.97  CALCIUM 9.2  --   --  9.1  --   --  9.0 8.9 8.8*  AST 19  --   --  16  --   --  19 21 21   ALT 16  --   --  14  --   --  14 19 25   ALKPHOS 63  --   --  64  --   --  58 60 54  BILITOT 1.3*  --   --  1.1  --   --  1.0 0.6 0.6  ALBUMIN 3.4*  --   --  2.9*  --   --  2.5* 2.4* 2.3*  MG  --   --   --  1.7  --   --  1.9 1.8 1.9  CRP  --   --   --  32.5*  --   --  26.6* 16.9* 10.2*  DDIMER  --   --  2.76* 2.86*  --   --  2.90* 2.09* 2.17*  PROCALCITON  --   --   --   --   --   --  5.21 2.93 1.18  LATICACIDVEN  --  1.2  --   --   --   --    --   --   --   HGBA1C  --   --   --   --  5.6  --   --   --   --   BNP  --   --   --   --   --  352.7* 344.5* 235.3*  240.7*    ------------------------------------------------------------------------------------------------------------------ No results for input(s): CHOL, HDL, LDLCALC, TRIG, CHOLHDL, LDLDIRECT in the last 72 hours.  Lab Results  Component Value Date   HGBA1C 5.6 03/23/2020   ------------------------------------------------------------------------------------------------------------------ No results for input(s): TSH, T4TOTAL, T3FREE, THYROIDAB in the last 72 hours.  Invalid input(s): FREET3  Cardiac Enzymes No results for input(s): CKMB, TROPONINI, MYOGLOBIN in the last 168 hours.  Invalid input(s): CK ------------------------------------------------------------------------------------------------------------------    Component Value Date/Time   BNP 240.7 (H) 03/26/2020 0344    Micro Results Recent Results (from the past 240 hour(s))  SARS Coronavirus 2 by RT PCR (hospital order, performed in [facility] hospital lab) Nasopharyngeal Nasopharyngeal Swab     Status: Abnormal   Collection Time: 03/22/20  1:18 PM   Specimen: Nasopharyngeal Swab  Result Value Ref Range Status   SARS Coronavirus 2 POSITIVE (A) NEGATIVE Final    Comment: RESULT CALLED TO, READ BACK BY AND VERIFIED WITHKeturah Barre RN 1528 03/22/20 A BROWNING (NOTE) SARS-CoV-2 target nucleic acids are DETECTED  SARS-CoV-2 RNA is generally detectable in upper respiratory specimens  during the acute phase of infection.  Positive results are indicative  of the presence of the identified virus, but do not rule out bacterial infection or co-infection with other pathogens not detected by the test.  Clinical correlation with patient history and  other diagnostic information is necessary to determine patient infection status.  The expected result is negative.  Fact Sheet for Patients:     StrictlyIdeas.no   Fact Sheet for Healthcare Providers:   BankingDealers.co.za    This test is not yet approved or cleared by the [place] FDA and  has been authorized for detection and/or diagnosis of SARS-CoV-2 by FDA under an Emergency Use Authorization (EUA).  This EUA will remain in effect (meaning this  test can be used) for the duration of  the COVID-19 declaration under Section 564(b)(1) of the Act, 21 U.S.C. section 360-bbb-3(b)(1), unless the authorization is terminated or revoked sooner.  Performed at [facility], [place] [address], [place], [place] [address]   Body fluid culture     Status: None (Preliminary result)   Collection Time: 03/23/20  3:08 PM   Specimen: Pleura; Body Fluid  Result Value Ref Range Status   Specimen Description PLEURAL  Final   Special Requests PLEURAL R  Final   Gram Stain   Final    ABUNDANT WBC PRESENT, PREDOMINANTLY PMN NO ORGANISMS SEEN    Culture   Final    RARE STREPTOCOCCUS INTERMEDIUS CRITICAL RESULT CALLED TO, READ BACK BY AND VERIFIED WITH: RN Sherlyn Lick [phone] FCP SUSCEPTIBILITIES TO FOLLOW Performed at [facility], [place] [address], [place], [place] [address]    Report Status PENDING  Incomplete    Radiology Reports DG Chest 1 View  Result Date: 03/25/2020 CLINICAL DATA:  Chest tube in place EXAM: CHEST  1 VIEW COMPARISON:  March 23, 2020 FINDINGS: A pigtail right chest tube is been placed. The right-sided pleural effusion is smaller although effusion and underlying opacity does persist. Postoperative changes in the right base. No pneumothorax. The left lung is clear. The cardiomediastinal silhouette is stable. IMPRESSION: 1. A chest tube is been placed on the right. The right-sided pleural effusion persists but is smaller. Electronically Signed   By: Dorise Bullion III M.D   On: 03/25/2020 19:50   DG Chest 2 View  Result Date: 03/21/2020 CLINICAL DATA:   79 year old male with cough and back pain.  Former smoker. Previous right middle lobectomy. EXAM: CHEST - 2 VIEW COMPARISON:  Chest CT 07/29/2019 and earlier. FINDINGS: Right lung volume loss and architectural distortion similar to the January CT. However, with increased streaky and indistinct right lung base opacity since that time. Left lung volume is stable. No superimposed pneumothorax. No definite pleural effusion. No pulmonary edema. Mediastinal contours are stable and within normal limits. Visualized tracheal air column is within normal limits. No acute osseous abnormality identified. Negative visible bowel gas pattern. IMPRESSION: Sequelae of prior right middle lobectomy, but with superimposed new interstitial opacity in the right lower lung. Favor acute viral/atypical respiratory infection. No definite pleural effusion. Recommend Followup PA and lateral chest X-ray, or alternatively repeat restaging Chest CT, following therapy to ensure resolution and exclude recurrent malignancy. Electronically Signed   By: Genevie Ann M.D.   On: 03/21/2020 22:26   CT Angio Chest PE W and/or Wo Contrast  Result Date: 03/22/2020 CLINICAL DATA:  Abdominal pain, pleuritic RIGHT chest pain, history lung cancer post resection, possible RIGHT groin hernia, prior abnormal CT chest EXAM: CT ANGIOGRAPHY CHEST CT ABDOMEN AND PELVIS WITH CONTRAST TECHNIQUE: Multidetector CT imaging of the chest was performed using the standard protocol during bolus administration of intravenous contrast. Multiplanar CT image reconstructions and MIPs were obtained to evaluate the vascular anatomy. Multidetector CT imaging of the abdomen and pelvis was performed using the standard protocol during bolus administration of intravenous contrast. CONTRAST:  136mL OMNIPAQUE IOHEXOL 350 MG/ML SOLN IV. No oral contrast. COMPARISON:  CT chest 07/29/2019 FINDINGS: CTA CHEST FINDINGS Cardiovascular: Atherosclerotic calcifications of thoracic aorta and coronary  arteries. Aorta normal caliber without aneurysm or dissection. Heart unremarkable. No pericardial effusion. Pulmonary arteries adequately opacified. Scattered respiratory motion artifacts at the lower lobes. No definite pulmonary emboli identified. Mediastinum/Nodes: Esophagus unremarkable. Base of cervical region normal appearance. 11 mm precarinal node slightly increased from previous exam. Additional small normal size mediastinal lymph nodes without additional thoracic adenopathy. Lungs/Pleura: Partially loculated RIGHT pleural effusion. Compressive atelectasis of RIGHT lower lobe. Prior RIGHT middle lobe resection. Loculated fluid in RIGHT major fissure posteriorly. Underlying emphysematous changes. No acute infiltrate, pleural effusion or pneumothorax. Small focus of scarring in the RIGHT upper lobe unchanged image 29. Musculoskeletal: No acute osseous findings. Review of the MIP images confirms the above findings. CT ABDOMEN and PELVIS FINDINGS Hepatobiliary: Calcified gallstone dependently in gallbladder measuring 5 mm diameter. Gallbladder and liver otherwise normal appearance. Pancreas: Normal appearance Spleen: Normal appearance Adrenals/Urinary Tract: Adrenal glands kidneys, ureters, and bladder normal appearance Stomach/Bowel: Short segment of appendiceal base normal appearance. Stomach and bowel loops normal appearance. Vascular/Lymphatic: Atherosclerotic calcifications of aortic and iliac arteries without aneurysm. No adenopathy. Reproductive: Prostatic enlargement gland measuring 6.5 x 6.5 x 5.5 cm (volume = 120 cm^3) Other: BILATERAL inguinal hernias containing fat larger on RIGHT. No free air or free fluid. No inflammatory process. Tiny umbilical hernia containing fat. Musculoskeletal: Bones demineralized. Degenerative disc disease changes thoracolumbar spine. Review of the MIP images confirms the above findings. IMPRESSION: No evidence of pulmonary embolism. Partially loculated RIGHT pleural  effusion with compressive atelectasis of the RIGHT lower lobe. Prior RIGHT middle lobe resection. Single minimally enlarged precarinal lymph node 11 mm short axis. No acute intra-abdominal or intrapelvic abnormalities. Prostatic enlargement. BILATERAL inguinal hernias containing fat larger on RIGHT. Cholelithiasis. Aortic Atherosclerosis (ICD10-I70.0) and Emphysema (ICD10-J43.9). Electronically Signed   By: Lavonia Dana M.D.   On: 03/22/2020 15:03   CT ABDOMEN PELVIS W CONTRAST  Result Date: 03/22/2020 CLINICAL DATA:  Abdominal pain, pleuritic RIGHT chest pain, history lung cancer post resection, possible RIGHT groin hernia, prior abnormal CT chest EXAM: CT ANGIOGRAPHY CHEST CT ABDOMEN AND PELVIS WITH CONTRAST TECHNIQUE: Multidetector CT imaging of the chest was performed using the standard protocol during bolus administration of intravenous contrast. Multiplanar CT image reconstructions and MIPs were obtained to evaluate the vascular anatomy. Multidetector CT imaging of the abdomen and pelvis was performed using the standard protocol during bolus administration of intravenous contrast. CONTRAST:  114mL OMNIPAQUE IOHEXOL 350 MG/ML SOLN IV. No oral contrast. COMPARISON:  CT chest 07/29/2019 FINDINGS: CTA CHEST FINDINGS Cardiovascular: Atherosclerotic calcifications of thoracic aorta and coronary arteries. Aorta normal caliber without aneurysm or dissection. Heart unremarkable. No pericardial effusion. Pulmonary arteries adequately opacified. Scattered respiratory motion artifacts at the lower lobes. No definite pulmonary emboli identified. Mediastinum/Nodes: Esophagus unremarkable. Base of cervical region normal appearance. 11 mm precarinal node slightly increased from previous exam. Additional small normal size mediastinal lymph nodes without additional thoracic adenopathy. Lungs/Pleura: Partially loculated RIGHT pleural effusion. Compressive atelectasis of RIGHT lower lobe. Prior RIGHT middle lobe resection.  Loculated fluid in RIGHT major fissure posteriorly. Underlying emphysematous changes. No acute infiltrate, pleural effusion or pneumothorax. Small focus of scarring in the RIGHT upper lobe unchanged image 29. Musculoskeletal: No acute osseous findings. Review of the MIP images confirms the above findings. CT ABDOMEN and PELVIS FINDINGS Hepatobiliary: Calcified gallstone dependently in gallbladder measuring 5 mm diameter. Gallbladder and liver otherwise normal appearance. Pancreas: Normal appearance Spleen: Normal appearance Adrenals/Urinary Tract: Adrenal glands kidneys, ureters, and bladder normal appearance Stomach/Bowel: Short segment of appendiceal base normal appearance. Stomach and bowel loops normal appearance. Vascular/Lymphatic: Atherosclerotic calcifications of aortic and iliac arteries without aneurysm. No adenopathy. Reproductive: Prostatic enlargement gland measuring 6.5 x 6.5 x 5.5 cm (volume = 120 cm^3) Other: BILATERAL inguinal hernias containing fat larger on RIGHT. No free air or free fluid. No inflammatory process. Tiny umbilical hernia containing fat. Musculoskeletal: Bones demineralized. Degenerative disc disease changes thoracolumbar spine. Review of the MIP images confirms the above findings. IMPRESSION: No evidence of pulmonary embolism. Partially loculated RIGHT pleural effusion with compressive atelectasis of the RIGHT lower lobe. Prior RIGHT middle lobe resection. Single minimally enlarged precarinal lymph node 11 mm short axis. No acute intra-abdominal or intrapelvic abnormalities. Prostatic enlargement. BILATERAL inguinal hernias containing fat larger on RIGHT. Cholelithiasis. Aortic Atherosclerosis (ICD10-I70.0) and Emphysema (ICD10-J43.9). Electronically Signed   By: Lavonia Dana M.D.   On: 03/22/2020 15:03   DG Chest Port 1 View  Result Date: 03/26/2020 CLINICAL DATA:  Reason for exam: chest tube in place EXAM: PORTABLE CHEST - 1 VIEW COMPARISON:  the previous day's study FINDINGS:  Stable pigtail chest tube at the right lung base. No pneumothorax. Patchy atelectasis/consolidation at the lung bases right greater than left. Staple lines in the right mid lung. Heart size normal.  Aortic Atherosclerosis (ICD10-170.0). There may be a small loculated right pleural effusion laterally at the base. Previous right thoracotomy. IMPRESSION: Stable right chest tube with no pneumothorax. Electronically Signed   By: Lucrezia Europe M.D.   On: 03/26/2020 07:03   DG CHEST PORT 1 VIEW  Result Date: 03/23/2020 CLINICAL DATA:  Status post thoracentesis. EXAM: PORTABLE CHEST 1 VIEW COMPARISON:  03/21/2020 and chest CT, 03/22/2020. FINDINGS: There is there is opacity at the right lung base at is similar to the previous day's CT scan, consistent with a combination of residual fluid and atelectasis. Postsurgical changes are noted with right mid to lower lung anastomosis staples in the  few surgical vascular clips. No pneumothorax. Left lung is hyperexpanded but clear. IMPRESSION: 1. No pneumothorax or evidence of a complication following thoracentesis. 2. Persistent opacity at the right lung base consistent with a combination of residual pleural fluid and atelectasis. Postsurgical changes in the right lower lung are unchanged from the previous chest radiograph. Electronically Signed   By: Lajean Manes M.D.   On: 03/23/2020 14:54   VAS [place] LOWER EXTREMITY VENOUS (DVT)  Result Date: 03/23/2020  Lower Venous DVTStudy Indications: + Covid, and Swelling.  Risk Factors: None identified. Performing Technologist: Griffin Basil RCT RDMS  Examination Guidelines: A complete evaluation includes B-mode imaging, spectral Doppler, color Doppler, and power Doppler as needed of all accessible portions of each vessel. Bilateral testing is considered an integral part of a complete examination. Limited examinations for reoccurring indications may be performed as noted. The reflux portion of the exam is performed with the patient in  reverse Trendelenburg.  +---------+---------------+---------+-----------+----------+--------------+ RIGHT    CompressibilityPhasicitySpontaneityPropertiesThrombus Aging +---------+---------------+---------+-----------+----------+--------------+ CFV      Full           Yes      Yes                                 +---------+---------------+---------+-----------+----------+--------------+ SFJ      Full                                                        +---------+---------------+---------+-----------+----------+--------------+ FV Prox  Full                                                        +---------+---------------+---------+-----------+----------+--------------+ FV Mid   Full                                                        +---------+---------------+---------+-----------+----------+--------------+ FV DistalFull                                                        +---------+---------------+---------+-----------+----------+--------------+ PFV      Full                                                        +---------+---------------+---------+-----------+----------+--------------+ POP      Full           Yes      Yes                                 +---------+---------------+---------+-----------+----------+--------------+ PTV      Full                                                        +---------+---------------+---------+-----------+----------+--------------+  PERO     Full                                                        +---------+---------------+---------+-----------+----------+--------------+   +---------+---------------+---------+-----------+----------+--------------+ LEFT     CompressibilityPhasicitySpontaneityPropertiesThrombus Aging +---------+---------------+---------+-----------+----------+--------------+ CFV      Full           Yes      Yes                                  +---------+---------------+---------+-----------+----------+--------------+ SFJ      Full                                                        +---------+---------------+---------+-----------+----------+--------------+ FV Prox  Full                                                        +---------+---------------+---------+-----------+----------+--------------+ FV Mid   Full                                                        +---------+---------------+---------+-----------+----------+--------------+ FV DistalFull                                                        +---------+---------------+---------+-----------+----------+--------------+ PFV      Full                                                        +---------+---------------+---------+-----------+----------+--------------+ POP      Full           Yes      Yes                                 +---------+---------------+---------+-----------+----------+--------------+ PTV      Full                                                        +---------+---------------+---------+-----------+----------+--------------+ PERO     Full                                                        +---------+---------------+---------+-----------+----------+--------------+  Summary: RIGHT: - There is no evidence of deep vein thrombosis in the lower extremity.  - No cystic structure found in the popliteal fossa.  LEFT: - There is no evidence of deep vein thrombosis in the lower extremity.  - No cystic structure found in the popliteal fossa.  *See table(s) above for measurements and observations. Electronically signed by Servando Snare MD on 03/23/2020 at 12:08:48 PM.    Final

## 2020-03-26 NOTE — Progress Notes (Addendum)
NAME:  Mathew Hall, MRN:  [id number], DOB:  1940/10/03, LOS: 4 ADMISSION DATE:  03/21/2020, CONSULTATION DATE:  9/17 REFERRING MD:  Candiss Norse, CHIEF COMPLAINT:  Back pain   Brief History   79 y/o M admitted 9/15 with back pain (posterior mid-back), found to be incidentally positive for COVID.  Work up consistent with right sided pleural effusion.   Past Medical History  CAD s/p MI  HTN HLD GERD Asthma  Metastatic Non Small Cell lung cancer, adenocarcinoma.  Diagnosed 11/2008 with RML resection in 02/2009.  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  S/P 3 cycles chemo.   Significant Hospital Events   9/15 Admit   Consults:    Procedures:  9/18 Thoracentesis> 220 cc cloudy fluid removed, right pleural space 9/19 Chest tube placed (right) >>>  Significant Diagnostic Tests:  CT Angio Chest 9/17 >> loculated pleural effusion in R base, very mild centrilobular emphysema in upper lobes bilaterally, Compressive atelectasis vs consolidation R base, thickening of airways in R base  Micro Data:  COVID 9/16 >> positive  Pleural culture 9/17: strep intermedius  Antimicrobials:  9/19 CTX/azithro/remdesivir >>  Interim history/subjective:  Feels well today. Breathing comfortably. Some R sided back pain with coughing, otherwise, no complaints.   Objective   Blood pressure 123/64, pulse 66, temperature 98.3 F (36.8 C), temperature source Oral, resp. rate 17, height 5\' 10"  (1.778 m), weight 76.7 kg, SpO2 98 %.        Intake/Output Summary (Last 24 hours) at 03/26/2020 1224 Last data filed at 03/26/2020 0719 Gross per 24 hour  Intake 120 ml  Output 1120 ml  Net -1000 ml   Filed Weights   03/22/20 1323 03/24/20 0004  Weight: 79.4 kg 76.7 kg    Examination: General:  Resting comfortably in bed HENT: NCAT OP clear PULM: Diminished R base, normal effort CV: RRR, no mgr GI: BS+, soft, nontender MSK: normal  bulk and tone Neuro: awake, alert, no distress, MAEW   Resolved Hospital Problem list     Assessment & Plan:  Empyema due to CAP/aspiration: Strep intermedius on pleural fluid culture from [facility] 9/17. [place] with hyperechoic complicated loculated effusion. Chest tube placed for empyema 9/19 > Continue ceftriaxone and azithro, will need prolonged abx for strep empyema > f/u strep sensitivities > Chest tube to suction, suspect will require lytics. Chest tube with good output, will hold off on lytics for the time being.  > daily CXR while tube in place > please flush tube once a shift with 20 cc saline towards patient > aspiration workup, SLP eval  RML adenocarcinoma s/p resection/chemo 2010 > seeing as his effusion is proving to resemble empyema, no true urgency for bronchoscopy, although we can reconsider once he has recovered from this acute episode.   COVID 19  Agree with primary service management  Best practice:   Per Primary  Labs   CBC: Recent Labs  Lab 03/21/20 2207 03/23/20 0251 03/24/20 0156 03/25/20 0330 03/26/20 0344  WBC 7.6 12.8* 12.8* 14.4* 11.2*  NEUTROABS  --  11.4* 11.5* 12.9* 9.4*  HGB 12.0* 11.9* 11.2* 11.2* 11.0*  HCT 37.6* 36.8* 34.9* 34.3* 33.9*  MCV 88.1 88.0 87.5 88.2 88.3  PLT 251 233 235 245 166    Basic Metabolic Panel: Recent Labs  Lab 03/21/20 2207 03/23/20 0251 03/24/20 0156 03/25/20 0330 03/26/20 0344  NA 134* 135 136 135 136  K 3.0* 3.8 3.6 4.3 4.0  CL 103 103  103 101 104  CO2 19* 22 22 25 25   GLUCOSE 134* 138* 134* 156* 130*  BUN 21 17 25* 28* 26*  CREATININE 1.24 0.93 0.79 0.92 0.97  CALCIUM 9.2 9.1 9.0 8.9 8.8*  MG  --  1.7 1.9 1.8 1.9  PHOS  --  2.4* 2.9 3.1 3.5   GFR: Estimated Creatinine Clearance: 64.8 mL/min (by C-G formula based on SCr of 0.97 mg/dL). Recent Labs  Lab 03/21/20 2207 03/22/20 1219 03/23/20 0251 03/24/20 0156 03/25/20 0330 03/26/20 0344  PROCALCITON  --   --   --  5.21 2.93 1.18  WBC   < >  --   12.8* 12.8* 14.4* 11.2*  LATICACIDVEN  --  1.2  --   --   --   --    < > = values in this interval not displayed.    Liver Function Tests: Recent Labs  Lab 03/21/20 2207 03/23/20 0251 03/24/20 0156 03/25/20 0330 03/26/20 0344  AST 19 16 19 21 21   ALT 16 14 14 19 25   ALKPHOS 63 64 58 60 54  BILITOT 1.3* 1.1 1.0 0.6 0.6  PROT 7.9 7.4 6.7  6.8 6.4* 6.3*  ALBUMIN 3.4* 2.9* 2.5* 2.4* 2.3*   Recent Labs  Lab 03/21/20 2207  LIPASE 27   No results for input(s): AMMONIA in the last 168 hours.  ABG    Component Value Date/Time   PHART 7.429 03/06/2009 0437   PCO2ART 36.0 03/06/2009 0437   PO2ART 62.0 (L) 03/06/2009 0437   HCO3 23.9 03/06/2009 0437   TCO2 25 03/06/2009 0437   ACIDBASEDEF 0.5 03/01/2009 0922   O2SAT 93.0 03/06/2009 0437     Coagulation Profile: No results for input(s): INR, PROTIME in the last 168 hours.  Cardiac Enzymes: No results for input(s): CKTOTAL, CKMB, CKMBINDEX, TROPONINI in the last 168 hours.  HbA1C: Hgb A1c MFr Bld  Date/Time Value Ref Range Status  03/23/2020 09:03 AM 5.6 4.8 - 5.6 % Final    Comment:    (NOTE) Pre diabetes:          5.7%-6.4%  Diabetes:              >6.4%  Glycemic control for   <7.0% adults with diabetes     CBG: Recent Labs  Lab 03/25/20 1219 03/25/20 1609 03/25/20 2107 03/26/20 0739 03/26/20 1200  GLUCAP 113* 159* 147* 103* 102*     Critical care time: n/a      Georgann Housekeeper, AGACNP-BC [place] for personal pager PCCM on call pager [phone]  03/26/2020 12:28 PM

## 2020-03-26 NOTE — Plan of Care (Signed)

## 2020-03-27 ENCOUNTER — Inpatient Hospital Stay (HOSPITAL_COMMUNITY): Payer: Medicare Other

## 2020-03-27 DIAGNOSIS — Z9689 Presence of other specified functional implants: Secondary | ICD-10-CM

## 2020-03-27 LAB — COMPREHENSIVE METABOLIC PANEL
ALT: 27 U/L (ref 0–44)
AST: 19 U/L (ref 15–41)
Albumin: 2.3 g/dL — ABNORMAL LOW (ref 3.5–5.0)
Alkaline Phosphatase: 55 U/L (ref 38–126)
Anion gap: 9 (ref 5–15)
BUN: 27 mg/dL — ABNORMAL HIGH (ref 8–23)
CO2: 25 mmol/L (ref 22–32)
Calcium: 8.4 mg/dL — ABNORMAL LOW (ref 8.9–10.3)
Chloride: 102 mmol/L (ref 98–111)
Creatinine, Ser: 1.02 mg/dL (ref 0.61–1.24)
GFR calc Af Amer: >60 mL/min (ref >60)
GFR calc non Af Amer: >60 mL/min (ref >60)
Glucose, Bld: 94 mg/dL (ref 70–99)
Potassium: 4.2 mmol/L (ref 3.5–5.1)
Sodium: 136 mmol/L (ref 135–145)
Total Bilirubin: 0.7 mg/dL (ref 0.3–1.2)
Total Protein: 6 g/dL — ABNORMAL LOW (ref 6.5–8.1)

## 2020-03-27 LAB — BRAIN NATRIURETIC PEPTIDE: B Natriuretic Peptide: 143 pg/mL — ABNORMAL HIGH (ref 0.0–100.0)

## 2020-03-27 LAB — CBC WITH DIFFERENTIAL/PLATELET
Abs Immature Granulocytes: 0.17 10*3/uL — ABNORMAL HIGH (ref 0.00–0.07)
Basophils Absolute: 0 10*3/uL (ref 0.0–0.1)
Basophils Relative: 0 %
Eosinophils Absolute: 0 10*3/uL (ref 0.0–0.5)
Eosinophils Relative: 0 %
HCT: 36.4 % — ABNORMAL LOW (ref 39.0–52.0)
Hemoglobin: 11.7 g/dL — ABNORMAL LOW (ref 13.0–17.0)
Immature Granulocytes: 2 %
Lymphocytes Relative: 17 %
Lymphs Abs: 1.5 10*3/uL (ref 0.7–4.0)
MCH: 28.1 pg (ref 26.0–34.0)
MCHC: 32.1 g/dL (ref 30.0–36.0)
MCV: 87.3 fL (ref 80.0–100.0)
Monocytes Absolute: 0.9 10*3/uL (ref 0.1–1.0)
Monocytes Relative: 11 %
Neutro Abs: 6 10*3/uL (ref 1.7–7.7)
Neutrophils Relative %: 70 %
Platelets: 277 10*3/uL (ref 150–400)
RBC: 4.17 MIL/uL — ABNORMAL LOW (ref 4.22–5.81)
RDW: 14.1 % (ref 11.5–15.5)
WBC: 8.6 10*3/uL (ref 4.0–10.5)
nRBC: 0 % (ref 0.0–0.2)

## 2020-03-27 LAB — GLUCOSE, CAPILLARY
Glucose-Capillary: 95 mg/dL (ref 70–99)
Glucose-Capillary: 103 mg/dL — ABNORMAL HIGH (ref 70–99)
Glucose-Capillary: 109 mg/dL — ABNORMAL HIGH (ref 70–99)
Glucose-Capillary: 123 mg/dL — ABNORMAL HIGH (ref 70–99)

## 2020-03-27 LAB — PROCALCITONIN: Procalcitonin: 0.45 ng/mL

## 2020-03-27 LAB — BODY FLUID CULTURE

## 2020-03-27 LAB — TSH: TSH: 1.228 u[IU]/mL (ref 0.350–4.500)

## 2020-03-27 LAB — CYTOLOGY - NON PAP

## 2020-03-27 LAB — D-DIMER, QUANTITATIVE: D-Dimer, Quant: 3.67 ug/mL-FEU — ABNORMAL HIGH (ref 0.00–0.50)

## 2020-03-27 LAB — MAGNESIUM: Magnesium: 1.7 mg/dL (ref 1.7–2.4)

## 2020-03-27 LAB — C-REACTIVE PROTEIN: CRP: 6.8 mg/dL — ABNORMAL HIGH (ref <1.0)

## 2020-03-27 NOTE — Progress Notes (Addendum)
NAME:  Mathew Hall, MRN:  [id number], DOB:  02-02-1941, LOS: 5 ADMISSION DATE:  03/21/2020, CONSULTATION DATE:  9/17 REFERRING MD:  Candiss Norse, CHIEF COMPLAINT:  Back pain   Brief History   79 y/o M admitted 9/15 with back pain (posterior mid-back), found to be incidentally positive for COVID.  Work up consistent with right sided pleural effusion.   Past Medical History  CAD s/p MI  HTN HLD GERD Asthma  Metastatic Non Small Cell lung cancer, adenocarcinoma.  Diagnosed 11/2008 with RML resection in 02/2009.  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  S/P 3 cycles chemo.   Significant Hospital Events   9/15 Admit   Consults:    Procedures:  9/18 Thoracentesis> 220 cc cloudy fluid removed, right pleural space 9/19 Chest tube placed (right) >>>  Significant Diagnostic Tests:  CT Angio Chest 9/17 >> loculated pleural effusion in R base, very mild centrilobular emphysema in upper lobes bilaterally, Compressive atelectasis vs consolidation R base, thickening of airways in R base  Micro Data:  COVID 9/16 >> positive  Pleural culture 9/17: strep intermedius  Antimicrobials:  CTX 9/19 >>  Azithro 9/19 >> 9/20 Wilfred Lacy 9/19>>  Interim history/subjective:   Up to chair, comfortable No air leak   Objective   Blood pressure 111/72, pulse 69, temperature 97.9 F (36.6 C), temperature source Axillary, resp. rate 20, height 5\' 10"  (1.778 m), weight 77.7 kg, SpO2 95 %.        Intake/Output Summary (Last 24 hours) at 03/27/2020 1359 Last data filed at 03/27/2020 1321 Gross per 24 hour  Intake 620 ml  Output 1200 ml  Net -580 ml   Filed Weights   03/22/20 1323 03/24/20 0004 03/27/20 0505  Weight: 79.4 kg 76.7 kg 77.7 kg    Examination: General: Up to chair and comfortable HENT: Oropharynx clear, no oral lesions, no stridor PULM: Bilateral inspiratory crackles right greater than left, no respiratory  distress CV: Regular, distant, no murmur GI: Nondistended, positive bowel sounds MSK: No deformity Neuro: Awake, alert, appropriate, answers all questions and follows commands  Chest x-ray 9/21 reviewed by me, shows large-scale resolution of his right basilar loculated pleural effusion. There may still be some fluid in the minor fissure. Less atelectasis. Chest tube in position  Resolved Hospital Problem list     Assessment & Plan:  Empyema due to CAP/aspiration: Strep intermedius on pleural fluid culture from [facility] 9/17. [place] with hyperechoic complicated loculated effusion. Chest tube placed for empyema 9/19 -Antibiotics narrowed to ceftriaxone on 9/20. Would treat for at least 10 days given empyema with loculation -Chest tube continues to drain. Would leave in place until output 50-100 cc/day. Then could consider pulling. No real indication for TPA given improved chest x-ray and continued output. Consider DC suction and follow output, reaccumulation on chest x-ray -Consider repeat CT chest to characterize any residual loculated fluid but this appears to be resolved on his most recent chest x-ray. Think we can defer for now. -Standard chest tube care with 20 cc saline flush towards patient -Aspiration precautions  RML adenocarcinoma s/p resection/chemo 2010 -Recommend repeat CT after his pneumonia and empyema have been fully treated. If there continues to be volume loss or right lower lobe airway narrowing then would consider bronchoscopy at that time  COVID 19  -Agree with primary service management  Best practice:   Per Primary  Labs   CBC: Recent Labs  Lab 03/23/20 0251 03/24/20 0156 03/25/20 0330 03/26/20  0344 03/27/20 0159  WBC 12.8* 12.8* 14.4* 11.2* 8.6  NEUTROABS 11.4* 11.5* 12.9* 9.4* 6.0  HGB 11.9* 11.2* 11.2* 11.0* 11.7*  HCT 36.8* 34.9* 34.3* 33.9* 36.4*  MCV 88.0 87.5 88.2 88.3 87.3  PLT 233 235 245 273 825    Basic Metabolic Panel: Recent Labs  Lab  03/23/20 0251 03/24/20 0156 03/25/20 0330 03/26/20 0344 03/27/20 0159  NA 135 136 135 136 136  K 3.8 3.6 4.3 4.0 4.2  CL 103 103 101 104 102  CO2 22 22 25 25 25   GLUCOSE 138* 134* 156* 130* 94  BUN 17 25* 28* 26* 27*  CREATININE 0.93 0.79 0.92 0.97 1.02  CALCIUM 9.1 9.0 8.9 8.8* 8.4*  MG 1.7 1.9 1.8 1.9 1.7  PHOS 2.4* 2.9 3.1 3.5  --    GFR: Estimated Creatinine Clearance: 61.6 mL/min (by C-G formula based on SCr of 1.02 mg/dL). Recent Labs  Lab 03/22/20 1219 03/23/20 0251 03/24/20 0156 03/25/20 0330 03/26/20 0344 03/27/20 0159  PROCALCITON  --   --  5.21 2.93 1.18 0.45  WBC  --    < > 12.8* 14.4* 11.2* 8.6  LATICACIDVEN 1.2  --   --   --   --   --    < > = values in this interval not displayed.    Liver Function Tests: Recent Labs  Lab 03/23/20 0251 03/24/20 0156 03/25/20 0330 03/26/20 0344 03/27/20 0159  AST 16 19 21 21 19   ALT 14 14 19 25 27   ALKPHOS 64 58 60 54 55  BILITOT 1.1 1.0 0.6 0.6 0.7  PROT 7.4 6.7  6.8 6.4* 6.3* 6.0*  ALBUMIN 2.9* 2.5* 2.4* 2.3* 2.3*   Recent Labs  Lab 03/21/20 2207  LIPASE 27   No results for input(s): AMMONIA in the last 168 hours.  ABG    Component Value Date/Time   PHART 7.429 03/06/2009 0437   PCO2ART 36.0 03/06/2009 0437   PO2ART 62.0 (L) 03/06/2009 0437   HCO3 23.9 03/06/2009 0437   TCO2 25 03/06/2009 0437   ACIDBASEDEF 0.5 03/01/2009 0922   O2SAT 93.0 03/06/2009 0437     Coagulation Profile: No results for input(s): INR, PROTIME in the last 168 hours.  Cardiac Enzymes: No results for input(s): CKTOTAL, CKMB, CKMBINDEX, TROPONINI in the last 168 hours.  HbA1C: Hgb A1c MFr Bld  Date/Time Value Ref Range Status  03/23/2020 09:03 AM 5.6 4.8 - 5.6 % Final    Comment:    (NOTE) Pre diabetes:          5.7%-6.4%  Diabetes:              >6.4%  Glycemic control for   <7.0% adults with diabetes     CBG: Recent Labs  Lab 03/26/20 1200 03/26/20 1650 03/26/20 2047 03/27/20 0721 03/27/20 1207    GLUCAP 102* 112* 143* 95 103*     Critical care time: n/a     Baltazar Apo, MD, PhD 03/27/2020, 2:06 PM [facility] [phone] or if no answer [phone]

## 2020-03-27 NOTE — Evaluation (Signed)
Clinical/Bedside Swallow Evaluation Patient Details  Name: Mathew Hall MRN: [id number] Date of Birth: 1941/07/06  Today's Date: 03/27/2020 Time: SLP Start Time (ACUTE ONLY): 0908 SLP Stop Time (ACUTE ONLY): 7564 SLP Time Calculation (min) (ACUTE ONLY): 14 min  Past Medical History:  Past Medical History:  Diagnosis Date   Asthma    CAD (coronary artery disease) 1999   stents to RCA, residual 40% Lt main, decreased EF40%    Cataract    bilateral-beginning stages   Colon polyps    GERD (gastroesophageal reflux disease)    H/O cardiovascular stress test 1999/2005/2008/2011   last with inf scar/infarct, EF 54%   H/O echocardiogram 2008   EF 50-55%, Mild MR, Mild TR,     Hemorrhoids    Hx of acute myocardial infarction of inferior wall 02/18/1998   Hyperlipemia    Hypertension    lung ca dx'd 10/2008   chemo comp 01/2009-stage 4 lung cancer   Myocardial infarction [facility]) 02-18-1998   Peptic stricture of esophagus    Right carotid bruit 04/01/12   minimal carotid stenosis on doppler   Past Surgical History:  Past Surgical History:  Procedure Laterality Date   ANGIOPLASTY     with 2 stent placement   COLONOSCOPY     CORONARY ANGIOPLASTY WITH STENT PLACEMENT  02/18/1998   PCI/stents to ostial and mid RCA, emergently, residual Lt main disease   LUNG SURGERY     right   POLYPECTOMY     HPP 09-21-08   HPI:  Mathew Hall is a 79 y.o. male with medical history significant of lung CA status post partial resection, hypertension, HLD, GERD, CAD with MI remote, presented with worsening back pain and cough. ER work-up shows incidental COVID-19 infection along with a partially loculated right-sided pleural effusion. 9/17 thoracentesis for pleural effusion. Chest tube placement 9/19 for effusion/empyema.   Assessment / Plan / Recommendation Clinical Impression  Pt was alert and cooperative during BSE. He reports acid reflux that has caused difficulty swallowing in the past, but he states  that it is well managed with medication. He demonstrated the appearance of a functional swallow without any overt s/sx of aspiration across POs. No coughing with 3 oz water swallow. Recommend regular diet and thin liquids. No SLP f/u recommended.  SLP Visit Diagnosis: Dysphagia, unspecified (R13.10)    Aspiration Risk  Mild aspiration risk    Diet Recommendation Regular;Thin liquid   Liquid Administration via: Cup;Straw Medication Administration: Whole meds with liquid Supervision: Patient able to self feed Compensations: Slow rate;Small sips/bites Postural Changes: Seated upright at 90 degrees    Other  Recommendations Oral Care Recommendations: Oral care BID   Follow up Recommendations        Frequency and Duration            Prognosis        Swallow Study   General HPI: Mathew Hall is a 79 y.o. male with medical history significant of lung CA status post partial resection, hypertension, HLD, GERD, CAD with MI remote, presented with worsening back pain and cough. ER work-up shows incidental COVID-19 infection along with a partially loculated right-sided pleural effusion. 9/17 thoracentesis for pleural effusion. Chest tube placement 9/19 for effusion/empyema. Type of Study: Bedside Swallow Evaluation Diet Prior to this Study: Regular;Thin liquids Temperature Spikes Noted: No Respiratory Status: Room air History of Recent Intubation: No Behavior/Cognition: Alert;Cooperative;Pleasant mood Oral Cavity Assessment: Within Functional Limits Oral Care Completed by SLP: No Oral Cavity - Dentition: Adequate  natural dentition Vision: Functional for self-feeding Self-Feeding Abilities: Able to feed self Patient Positioning: Upright in chair Baseline Vocal Quality: Normal    Oral/Motor/Sensory Function Overall Oral Motor/Sensory Function: Within functional limits   Ice Chips Ice chips: Not tested   Thin Liquid Thin Liquid: Within functional limits Presentation: Straw;Cup     Nectar Thick Nectar Thick Liquid: Not tested   Honey Thick Honey Thick Liquid: Not tested   Puree Puree: Within functional limits Presentation: Self Fed;Spoon   Solid     Solid: Within functional limits Presentation: Self Fed      Greggory Keen 03/27/2020,10:28 AM

## 2020-03-27 NOTE — Progress Notes (Addendum)
PROGRESS NOTE                                                                                                                                                                                                             Patient Demographics:    Mathew Hall, is a 79 y.o. male, DOB - [birth year], BLT:[id number]  Outpatient Primary MD for the patient is [name], MD    LOS - 5  Admit date - 03/21/2020    Chief Complaint  Patient presents with  . Back Pain       Brief Narrative - Mathew Hall is a 79 y.o. male with medical history significant of lung CA status post partial resection, hypertension, HLD, GERD, CAD with MI remote, presented with worsening back pain and cough.  Patient did eat grass cutting 2 days ago, and he said that usually after doing grass cutting we will have back pain for a day or 2.  ER work-up shows incidental COVID-19 infection along with a partially loculated right-sided pleural effusion and he was admitted to the hospital.   Subjective:   Patient in bed, appears comfortable, denies any headache, no fever, no chest pain or pressure, no shortness of breath , no abdominal pain. No focal weakness.   Assessment  & Plan :     1. Incidental COVID-19 Infection in a patient is fully vaccinated.  Although his CRP is elevated he has no hypoxia or oxygen demand at rest, he has finished his short course of steroids and Remdesivir and this problem seems to have resolved.  Encouraged the patient to sit up in chair in the daytime use I-S and flutter valve for pulmonary toiletry and then prone in bed when at night.  Will advance activity and titrate down oxygen as possible.   SpO2: 96 % O2 Flow Rate (L/min): 2 L/min  Recent Labs  Lab  0000 03/22/20 1219 03/22/20 1318 03/22/20 2106 03/23/20 0251 03/23/20 0913 03/24/20 0156 03/25/20 0330 03/26/20 0344 03/27/20 0159  WBC   < >  --   --   --  12.8*   --  12.8* 14.4* 11.2* 8.6  PLT   < >  --   --   --  233  --  235 245 273 277  CRP  --   --   --   --  32.5*  --  26.6* 16.9* 10.2* 6.8*  BNP  --   --   --   --   --  352.7* 344.5* 235.3* 240.7* 143.0*  DDIMER  --   --   --    < > 2.86*  --  2.90* 2.09* 2.17* 3.67*  PROCALCITON  --   --   --   --   --   --  5.21 2.93 1.18 0.45  AST   < >  --   --   --  16  --  19 21 21 19   ALT   < >  --   --   --  14  --  14 19 25 27   ALKPHOS   < >  --   --   --  64  --  58 60 54 55  BILITOT   < >  --   --   --  1.1  --  1.0 0.6 0.6 0.7  ALBUMIN   < >  --   --   --  2.9*  --  2.5* 2.4* 2.3* 2.3*  LATICACIDVEN  --  1.2  --   --   --   --   --   --   --   --   SARSCOV2NAA  --   --  POSITIVE*  --   --   --   --   --   --   --    < > = values in this interval not displayed.    2.  History of  Metastatic nonsmall cell lung cancer, adenocarcinoma Right Lung cancer in the past ( 15 yrs ago, Dr Inda Merlin)  With status post neoadjuvant chemotherapy followed by right middle lobectomy with lymph node dissection  &  now with partially loculated right-sided pleural effusion which proved to be empyema secondary to likely chronic subclinical aspiration.  Did have elevated CRP along with procalcitonin, pleural fluid was exudative and follow studies and clinical evaluation by pulmonary critical care suggested that he likely had empyema versus parapneumonic effusion, underwent chest tube placement on the right side on 03/25/2020, continue IV Rocephin, pleural fluid growing Rocephin sensitive Streptococcus intermedius.  We will continue to monitor.  Follow final cytology report.  Will require outpatient pulmonary follow-up as well.  3.  Borderline elevated D-dimer.  Due to inflammation from #1 and 2.  Intermediate dose Lovenox, negative leg ultrasound.  4.  Essential hypertension.  On Norvasc, and a 3-second asymptomatic pause on telemetry on 03/27/2020 early morning, beta-blocker stopped.  As needed hydralazine continue,.  5.   History of CAD.  Chest pain-free no acute issues, on beta-blocker, aspirin and statin for secondary infection.  Continue.  6.  Dehydration.  Has been adequately hydrated with IV fluids.  7.  Right-sided parapneumonic effusion/empyema question silent aspiration.  Cleared by speech continue with supportive care as in #2 above.  8.  3 GERD asymptomatic sinus pause on telemetry.  Discontinued beta-blocker, check TSH and monitor.     Condition -   Guarded  Family Communication  : Wife Schoon [phone] on 03/23/2020, 03/27/2020 - message left at 11:17 AM.  Code Status :  Full  Consults  :  Pulm  Procedures  :    Right-sided chest tube placement on 03/25/2020 by pulmonary.   Right-sided ultrasound-guided thoracentesis on 03/23/2020.  Exudative fluid.  Cytology pending.    Lower extremity venous duplex.  No DVT.    CT - No evidence of pulmonary  embolism. Partially loculated RIGHT pleural effusion with compressive atelectasis of the RIGHT lower lobe. Prior RIGHT middle lobe resection. Single minimally enlarged precarinal lymph node 11 mm short axis. No acute intra-abdominal or intrapelvic abnormalities. Prostatic enlargement. BILATERAL inguinal hernias containing fat larger on RIGHT. Cholelithiasis. Aortic Atherosclerosis (ICD10-I70.0) and Emphysema (ICD10-J43.9).  PUD Prophylaxis : PPI  Disposition Plan  :    Status is: Inpatient  Remains inpatient appropriate because:IV treatments appropriate due to intensity of illness or inability to take PO   Dispo: The patient is from: Home              Anticipated d/c is to: Home              Anticipated d/c date is: 3 days              Patient currently is not medically stable to d/c.   DVT Prophylaxis  :  Lovenox    Lab Results  Component Value Date   PLT 277 03/27/2020    Diet :  Diet Order            Diet regular Room service appropriate? Yes; Fluid consistency: Thin  Diet effective now                  Inpatient  Medications  Scheduled Meds: . amLODipine  5 mg Oral Daily  . aspirin  325 mg Oral Daily  . enoxaparin (LOVENOX) injection  60 mg Subcutaneous Q12H  . insulin aspart  0-5 Units Subcutaneous QHS  . insulin aspart  0-9 Units Subcutaneous TID WC  . ipratropium  2 puff Inhalation Q6H  . pantoprazole  40 mg Oral Daily  . simvastatin  40 mg Oral q1800   Continuous Infusions: . cefTRIAXone (ROCEPHIN)  IV Stopped (03/26/20 1930)   PRN Meds:.acetaminophen, guaiFENesin-dextromethorphan, hydrALAZINE, [DISCONTINUED] ondansetron **OR** ondansetron (ZOFRAN) IV, oxybutynin  Antibiotics  :    Anti-infectives (From admission, onward)   Start     Dose/Rate Route Frequency Ordered Stop   03/24/20 1500  cefTRIAXone (ROCEPHIN) 2 g in sodium chloride 0.9 % 100 mL IVPB        2 g 200 mL/hr over 30 Minutes Intravenous Every 24 hours 03/24/20 1446     03/24/20 1500  azithromycin (ZITHROMAX) 500 mg in sodium chloride 0.9 % 250 mL IVPB  Status:  Discontinued        500 mg 250 mL/hr over 60 Minutes Intravenous Every 24 hours 03/24/20 1446 03/26/20 1141   03/23/20 1000  remdesivir 100 mg in sodium chloride 0.9 % 100 mL IVPB       "Followed by" Linked Group Details   100 mg 200 mL/hr over 30 Minutes Intravenous Daily 03/22/20 1806 03/26/20 2022   03/22/20 1845  remdesivir 200 mg in sodium chloride 0.9% 250 mL IVPB       "Followed by" Linked Group Details   200 mg 580 mL/hr over 30 Minutes Intravenous Once 03/22/20 1806 03/22/20 2025       Time Spent in minutes  30   Lala Lund M.D on 03/27/2020 at 11:14 AM  To page go to [email or web address] - password [facility]  [facility] -  Office  [phone]     See all Orders from today for further details    Objective:   Vitals:   03/26/20 2020 03/26/20 2049 03/27/20 0406 03/27/20 0505  BP:  123/60 120/70   Pulse:  63 66 66  Resp:  19 14   Temp:  98.2 F (  36.8 C) 98.6 F (37 C)   TempSrc:  Oral Oral   SpO2: 97% 98% 99% 96%  Weight:    77.7 kg   Height:        Wt Readings from Last 3 Encounters:  03/27/20 77.7 kg  09/02/19 78.9 kg  08/01/19 79.2 kg     Intake/Output Summary (Last 24 hours) at 03/27/2020 1114 Last data filed at 03/27/2020 0928 Gross per 24 hour  Intake 860 ml  Output 1000 ml  Net -140 ml     Physical Exam  Awake Alert, No new F.N deficits, Normal affect [place].AT,PERRAL Supple Neck,No JVD, No cervical lymphadenopathy appriciated.  Symmetrical Chest wall movement, Good air movement bilaterally,  right-sided chest tube in place RRR,No Gallops, Rubs or new Murmurs, No Parasternal Heave +ve B.Sounds, Abd Soft, No tenderness, No organomegaly appriciated, No rebound - guarding or rigidity. No Cyanosis, Clubbing or edema, No new Rash or bruise     Data Review:    CBC Recent Labs  Lab 03/23/20 0251 03/24/20 0156 03/25/20 0330 03/26/20 0344 03/27/20 0159  WBC 12.8* 12.8* 14.4* 11.2* 8.6  HGB 11.9* 11.2* 11.2* 11.0* 11.7*  HCT 36.8* 34.9* 34.3* 33.9* 36.4*  PLT 233 235 245 273 277  MCV 88.0 87.5 88.2 88.3 87.3  MCH 28.5 28.1 28.8 28.6 28.1  MCHC 32.3 32.1 32.7 32.4 32.1  RDW 13.8 14.0 14.2 14.2 14.1  LYMPHSABS 0.4* 0.5* 0.7 0.9 1.5  MONOABS 0.8 0.6 0.7 0.9 0.9  EOSABS 0.0 0.0 0.0 0.0 0.0  BASOSABS 0.0 0.0 0.0 0.0 0.0    Recent Labs  Lab 03/22/20 1219 03/22/20 2106 03/23/20 0251 03/23/20 0903 03/23/20 0913 03/24/20 0156 03/25/20 0330 03/26/20 0344 03/27/20 0159  NA  --   --  135  --   --  136 135 136 136  K  --   --  3.8  --   --  3.6 4.3 4.0 4.2  CL  --   --  103  --   --  103 101 104 102  CO2  --   --  22  --   --  22 25 25 25   GLUCOSE  --   --  138*  --   --  134* 156* 130* 94  BUN  --   --  17  --   --  25* 28* 26* 27*  CREATININE  --   --  0.93  --   --  0.79 0.92 0.97 1.02  CALCIUM  --   --  9.1  --   --  9.0 8.9 8.8* 8.4*  AST  --   --  16  --   --  19 21 21 19   ALT  --   --  14  --   --  14 19 25 27   ALKPHOS  --   --  64  --   --  58 60 54 55  BILITOT  --   --  1.1  --    --  1.0 0.6 0.6 0.7  ALBUMIN  --   --  2.9*  --   --  2.5* 2.4* 2.3* 2.3*  MG  --   --  1.7  --   --  1.9 1.8 1.9 1.7  CRP  --   --  32.5*  --   --  26.6* 16.9* 10.2* 6.8*  DDIMER  --    < > 2.86*  --   --  2.90* 2.09* 2.17* 3.67*  PROCALCITON  --   --   --   --   --  5.21 2.93 1.18 0.45  LATICACIDVEN 1.2  --   --   --   --   --   --   --   --   HGBA1C  --   --   --  5.6  --   --   --   --   --   BNP  --   --   --   --  352.7* 344.5* 235.3* 240.7* 143.0*   < > = values in this interval not displayed.    ------------------------------------------------------------------------------------------------------------------ No results for input(s): CHOL, HDL, LDLCALC, TRIG, CHOLHDL, LDLDIRECT in the last 72 hours.  Lab Results  Component Value Date   HGBA1C 5.6 03/23/2020   ------------------------------------------------------------------------------------------------------------------ No results for input(s): TSH, T4TOTAL, T3FREE, THYROIDAB in the last 72 hours.  Invalid input(s): FREET3  Cardiac Enzymes No results for input(s): CKMB, TROPONINI, MYOGLOBIN in the last 168 hours.  Invalid input(s): CK ------------------------------------------------------------------------------------------------------------------    Component Value Date/Time   BNP 143.0 (H) 03/27/2020 0159    Micro Results Recent Results (from the past 240 hour(s))  SARS Coronavirus 2 by RT PCR (hospital order, performed in [facility] hospital lab) Nasopharyngeal Nasopharyngeal Swab     Status: Abnormal   Collection Time: 03/22/20  1:18 PM   Specimen: Nasopharyngeal Swab  Result Value Ref Range Status   SARS Coronavirus 2 POSITIVE (A) NEGATIVE Final    Comment: RESULT CALLED TO, READ BACK BY AND VERIFIED WITHKeturah Barre RN 1528 03/22/20 A BROWNING (NOTE) SARS-CoV-2 target nucleic acids are DETECTED  SARS-CoV-2 RNA is generally detectable in upper respiratory specimens  during the acute phase of infection.   Positive results are indicative  of the presence of the identified virus, but do not rule out bacterial infection or co-infection with other pathogens not detected by the test.  Clinical correlation with patient history and  other diagnostic information is necessary to determine patient infection status.  The expected result is negative.  Fact Sheet for Patients:   StrictlyIdeas.no   Fact Sheet for Healthcare Providers:   BankingDealers.co.za    This test is not yet approved or cleared by the [place] FDA and  has been authorized for detection and/or diagnosis of SARS-CoV-2 by FDA under an Emergency Use Authorization (EUA).  This EUA will remain in effect (meaning this  test can be used) for the duration of  the COVID-19 declaration under Section 564(b)(1) of the Act, 21 U.S.C. section 360-bbb-3(b)(1), unless the authorization is terminated or revoked sooner.  Performed at [facility], [place] [address], [place], [place] [address]   Body fluid culture     Status: None   Collection Time: 03/23/20  3:08 PM   Specimen: Pleura; Body Fluid  Result Value Ref Range Status   Specimen Description PLEURAL  Final   Special Requests PLEURAL R  Final   Gram Stain   Final    ABUNDANT WBC PRESENT, PREDOMINANTLY PMN NO ORGANISMS SEEN    Culture   Final    RARE STREPTOCOCCUS INTERMEDIUS CRITICAL RESULT CALLED TO, READ BACK BY AND VERIFIED WITH: RN Sherlyn Lick [phone] FCP Performed at [facility], [address], [place], [place] [address]    Report Status 03/27/2020 FINAL  Final   Organism ID, Bacteria STREPTOCOCCUS INTERMEDIUS  Final      Susceptibility   Streptococcus intermedius - MIC*    PENICILLIN <=0.06 SENSITIVE  Sensitive     CEFTRIAXONE <=0.12 SENSITIVE Sensitive     ERYTHROMYCIN <=0.12 SENSITIVE Sensitive     LEVOFLOXACIN 0.5 SENSITIVE Sensitive     VANCOMYCIN 0.5 SENSITIVE Sensitive     * RARE STREPTOCOCCUS  INTERMEDIUS    Radiology Reports DG Chest 1 View  Result Date: 03/25/2020 CLINICAL DATA:  Chest tube in place EXAM: CHEST  1 VIEW COMPARISON:  March 23, 2020 FINDINGS: A pigtail right chest tube is been placed. The right-sided pleural effusion is smaller although effusion and underlying opacity does persist. Postoperative changes in the right base. No pneumothorax. The left lung is clear. The cardiomediastinal silhouette is stable. IMPRESSION: 1. A chest tube is been placed on the right. The right-sided pleural effusion persists but is smaller. Electronically Signed   By: Dorise Bullion III M.D   On: 03/25/2020 19:50   DG Chest 2 View  Result Date: 03/21/2020 CLINICAL DATA:  79 year old male with cough and back pain. Former smoker. Previous right middle lobectomy. EXAM: CHEST - 2 VIEW COMPARISON:  Chest CT 07/29/2019 and earlier. FINDINGS: Right lung volume loss and architectural distortion similar to the January CT. However, with increased streaky and indistinct right lung base opacity since that time. Left lung volume is stable. No superimposed pneumothorax. No definite pleural effusion. No pulmonary edema. Mediastinal contours are stable and within normal limits. Visualized tracheal air column is within normal limits. No acute osseous abnormality identified. Negative visible bowel gas pattern. IMPRESSION: Sequelae of prior right middle lobectomy, but with superimposed new interstitial opacity in the right lower lung. Favor acute viral/atypical respiratory infection. No definite pleural effusion. Recommend Followup PA and lateral chest X-ray, or alternatively repeat restaging Chest CT, following therapy to ensure resolution and exclude recurrent malignancy. Electronically Signed   By: Genevie Ann M.D.   On: 03/21/2020 22:26   CT Angio Chest PE W and/or Wo Contrast  Result Date: 03/22/2020 CLINICAL DATA:  Abdominal pain, pleuritic RIGHT chest pain, history lung cancer post resection, possible RIGHT  groin hernia, prior abnormal CT chest EXAM: CT ANGIOGRAPHY CHEST CT ABDOMEN AND PELVIS WITH CONTRAST TECHNIQUE: Multidetector CT imaging of the chest was performed using the standard protocol during bolus administration of intravenous contrast. Multiplanar CT image reconstructions and MIPs were obtained to evaluate the vascular anatomy. Multidetector CT imaging of the abdomen and pelvis was performed using the standard protocol during bolus administration of intravenous contrast. CONTRAST:  144mL OMNIPAQUE IOHEXOL 350 MG/ML SOLN IV. No oral contrast. COMPARISON:  CT chest 07/29/2019 FINDINGS: CTA CHEST FINDINGS Cardiovascular: Atherosclerotic calcifications of thoracic aorta and coronary arteries. Aorta normal caliber without aneurysm or dissection. Heart unremarkable. No pericardial effusion. Pulmonary arteries adequately opacified. Scattered respiratory motion artifacts at the lower lobes. No definite pulmonary emboli identified. Mediastinum/Nodes: Esophagus unremarkable. Base of cervical region normal appearance. 11 mm precarinal node slightly increased from previous exam. Additional small normal size mediastinal lymph nodes without additional thoracic adenopathy. Lungs/Pleura: Partially loculated RIGHT pleural effusion. Compressive atelectasis of RIGHT lower lobe. Prior RIGHT middle lobe resection. Loculated fluid in RIGHT major fissure posteriorly. Underlying emphysematous changes. No acute infiltrate, pleural effusion or pneumothorax. Small focus of scarring in the RIGHT upper lobe unchanged image 29. Musculoskeletal: No acute osseous findings. Review of the MIP images confirms the above findings. CT ABDOMEN and PELVIS FINDINGS Hepatobiliary: Calcified gallstone dependently in gallbladder measuring 5 mm diameter. Gallbladder and liver otherwise normal appearance. Pancreas: Normal appearance Spleen: Normal appearance Adrenals/Urinary Tract: Adrenal glands kidneys, ureters, and bladder normal appearance  Stomach/Bowel:  Short segment of appendiceal base normal appearance. Stomach and bowel loops normal appearance. Vascular/Lymphatic: Atherosclerotic calcifications of aortic and iliac arteries without aneurysm. No adenopathy. Reproductive: Prostatic enlargement gland measuring 6.5 x 6.5 x 5.5 cm (volume = 120 cm^3) Other: BILATERAL inguinal hernias containing fat larger on RIGHT. No free air or free fluid. No inflammatory process. Tiny umbilical hernia containing fat. Musculoskeletal: Bones demineralized. Degenerative disc disease changes thoracolumbar spine. Review of the MIP images confirms the above findings. IMPRESSION: No evidence of pulmonary embolism. Partially loculated RIGHT pleural effusion with compressive atelectasis of the RIGHT lower lobe. Prior RIGHT middle lobe resection. Single minimally enlarged precarinal lymph node 11 mm short axis. No acute intra-abdominal or intrapelvic abnormalities. Prostatic enlargement. BILATERAL inguinal hernias containing fat larger on RIGHT. Cholelithiasis. Aortic Atherosclerosis (ICD10-I70.0) and Emphysema (ICD10-J43.9). Electronically Signed   By: Lavonia Dana M.D.   On: 03/22/2020 15:03   CT ABDOMEN PELVIS W CONTRAST  Result Date: 03/22/2020 CLINICAL DATA:  Abdominal pain, pleuritic RIGHT chest pain, history lung cancer post resection, possible RIGHT groin hernia, prior abnormal CT chest EXAM: CT ANGIOGRAPHY CHEST CT ABDOMEN AND PELVIS WITH CONTRAST TECHNIQUE: Multidetector CT imaging of the chest was performed using the standard protocol during bolus administration of intravenous contrast. Multiplanar CT image reconstructions and MIPs were obtained to evaluate the vascular anatomy. Multidetector CT imaging of the abdomen and pelvis was performed using the standard protocol during bolus administration of intravenous contrast. CONTRAST:  133mL OMNIPAQUE IOHEXOL 350 MG/ML SOLN IV. No oral contrast. COMPARISON:  CT chest 07/29/2019 FINDINGS: CTA CHEST FINDINGS  Cardiovascular: Atherosclerotic calcifications of thoracic aorta and coronary arteries. Aorta normal caliber without aneurysm or dissection. Heart unremarkable. No pericardial effusion. Pulmonary arteries adequately opacified. Scattered respiratory motion artifacts at the lower lobes. No definite pulmonary emboli identified. Mediastinum/Nodes: Esophagus unremarkable. Base of cervical region normal appearance. 11 mm precarinal node slightly increased from previous exam. Additional small normal size mediastinal lymph nodes without additional thoracic adenopathy. Lungs/Pleura: Partially loculated RIGHT pleural effusion. Compressive atelectasis of RIGHT lower lobe. Prior RIGHT middle lobe resection. Loculated fluid in RIGHT major fissure posteriorly. Underlying emphysematous changes. No acute infiltrate, pleural effusion or pneumothorax. Small focus of scarring in the RIGHT upper lobe unchanged image 29. Musculoskeletal: No acute osseous findings. Review of the MIP images confirms the above findings. CT ABDOMEN and PELVIS FINDINGS Hepatobiliary: Calcified gallstone dependently in gallbladder measuring 5 mm diameter. Gallbladder and liver otherwise normal appearance. Pancreas: Normal appearance Spleen: Normal appearance Adrenals/Urinary Tract: Adrenal glands kidneys, ureters, and bladder normal appearance Stomach/Bowel: Short segment of appendiceal base normal appearance. Stomach and bowel loops normal appearance. Vascular/Lymphatic: Atherosclerotic calcifications of aortic and iliac arteries without aneurysm. No adenopathy. Reproductive: Prostatic enlargement gland measuring 6.5 x 6.5 x 5.5 cm (volume = 120 cm^3) Other: BILATERAL inguinal hernias containing fat larger on RIGHT. No free air or free fluid. No inflammatory process. Tiny umbilical hernia containing fat. Musculoskeletal: Bones demineralized. Degenerative disc disease changes thoracolumbar spine. Review of the MIP images confirms the above findings.  IMPRESSION: No evidence of pulmonary embolism. Partially loculated RIGHT pleural effusion with compressive atelectasis of the RIGHT lower lobe. Prior RIGHT middle lobe resection. Single minimally enlarged precarinal lymph node 11 mm short axis. No acute intra-abdominal or intrapelvic abnormalities. Prostatic enlargement. BILATERAL inguinal hernias containing fat larger on RIGHT. Cholelithiasis. Aortic Atherosclerosis (ICD10-I70.0) and Emphysema (ICD10-J43.9). Electronically Signed   By: Lavonia Dana M.D.   On: 03/22/2020 15:03   DG Chest Port 1 View  Result Date: 03/27/2020 CLINICAL DATA:  Empyema.  Chest tube.  COVID. EXAM: PORTABLE CHEST 1 VIEW COMPARISON:  03/26/2020. FINDINGS: Right chest tube in stable position. No pneumothorax. Mediastinum hilar structures normal. Persistent right base atelectasis/infiltrate. Persistent small right pleural effusion. Small amount of fluid may be in the right major fissure. Interim improvement in aeration in the left lung base. Stable deformity of the right posterior seventh rib. Degenerative changes scoliosis thoracic spine. IMPRESSION: 1.  Right chest tube in stable position.  No pneumothorax. 2. Persistent right base atelectasis/infiltrate. Persistent small right pleural effusion. Small amount of fluid may be present in the right major fissure. Similar findings noted on prior exam. Interim improvement in aeration in the left lung base. Electronically Signed   By: Marcello Moores  Register   On: 03/27/2020 06:27   DG Chest Port 1 View  Result Date: 03/26/2020 CLINICAL DATA:  Reason for exam: chest tube in place EXAM: PORTABLE CHEST - 1 VIEW COMPARISON:  the previous day's study FINDINGS: Stable pigtail chest tube at the right lung base. No pneumothorax. Patchy atelectasis/consolidation at the lung bases right greater than left. Staple lines in the right mid lung. Heart size normal.  Aortic Atherosclerosis (ICD10-170.0). There may be a small loculated right pleural effusion  laterally at the base. Previous right thoracotomy. IMPRESSION: Stable right chest tube with no pneumothorax. Electronically Signed   By: Lucrezia Europe M.D.   On: 03/26/2020 07:03   DG CHEST PORT 1 VIEW  Result Date: 03/23/2020 CLINICAL DATA:  Status post thoracentesis. EXAM: PORTABLE CHEST 1 VIEW COMPARISON:  03/21/2020 and chest CT, 03/22/2020. FINDINGS: There is there is opacity at the right lung base at is similar to the previous day's CT scan, consistent with a combination of residual fluid and atelectasis. Postsurgical changes are noted with right mid to lower lung anastomosis staples in the few surgical vascular clips. No pneumothorax. Left lung is hyperexpanded but clear. IMPRESSION: 1. No pneumothorax or evidence of a complication following thoracentesis. 2. Persistent opacity at the right lung base consistent with a combination of residual pleural fluid and atelectasis. Postsurgical changes in the right lower lung are unchanged from the previous chest radiograph. Electronically Signed   By: Lajean Manes M.D.   On: 03/23/2020 14:54   VAS [place] LOWER EXTREMITY VENOUS (DVT)  Result Date: 03/23/2020  Lower Venous DVTStudy Indications: + Covid, and Swelling.  Risk Factors: None identified. Performing Technologist: Griffin Basil RCT RDMS  Examination Guidelines: A complete evaluation includes B-mode imaging, spectral Doppler, color Doppler, and power Doppler as needed of all accessible portions of each vessel. Bilateral testing is considered an integral part of a complete examination. Limited examinations for reoccurring indications may be performed as noted. The reflux portion of the exam is performed with the patient in reverse Trendelenburg.  +---------+---------------+---------+-----------+----------+--------------+ RIGHT    CompressibilityPhasicitySpontaneityPropertiesThrombus Aging +---------+---------------+---------+-----------+----------+--------------+ CFV      Full           Yes       Yes                                 +---------+---------------+---------+-----------+----------+--------------+ SFJ      Full                                                        +---------+---------------+---------+-----------+----------+--------------+  FV Prox  Full                                                        +---------+---------------+---------+-----------+----------+--------------+ FV Mid   Full                                                        +---------+---------------+---------+-----------+----------+--------------+ FV DistalFull                                                        +---------+---------------+---------+-----------+----------+--------------+ PFV      Full                                                        +---------+---------------+---------+-----------+----------+--------------+ POP      Full           Yes      Yes                                 +---------+---------------+---------+-----------+----------+--------------+ PTV      Full                                                        +---------+---------------+---------+-----------+----------+--------------+ PERO     Full                                                        +---------+---------------+---------+-----------+----------+--------------+   +---------+---------------+---------+-----------+----------+--------------+ LEFT     CompressibilityPhasicitySpontaneityPropertiesThrombus Aging +---------+---------------+---------+-----------+----------+--------------+ CFV      Full           Yes      Yes                                 +---------+---------------+---------+-----------+----------+--------------+ SFJ      Full                                                        +---------+---------------+---------+-----------+----------+--------------+ FV Prox  Full                                                         +---------+---------------+---------+-----------+----------+--------------+  FV Mid   Full                                                        +---------+---------------+---------+-----------+----------+--------------+ FV DistalFull                                                        +---------+---------------+---------+-----------+----------+--------------+ PFV      Full                                                        +---------+---------------+---------+-----------+----------+--------------+ POP      Full           Yes      Yes                                 +---------+---------------+---------+-----------+----------+--------------+ PTV      Full                                                        +---------+---------------+---------+-----------+----------+--------------+ PERO     Full                                                        +---------+---------------+---------+-----------+----------+--------------+     Summary: RIGHT: - There is no evidence of deep vein thrombosis in the lower extremity.  - No cystic structure found in the popliteal fossa.  LEFT: - There is no evidence of deep vein thrombosis in the lower extremity.  - No cystic structure found in the popliteal fossa.  *See table(s) above for measurements and observations. Electronically signed by Servando Snare MD on 03/23/2020 at 12:08:48 PM.    Final

## 2020-03-28 ENCOUNTER — Inpatient Hospital Stay (HOSPITAL_COMMUNITY): Payer: Medicare Other

## 2020-03-28 DIAGNOSIS — U071 COVID-19: Secondary | ICD-10-CM | POA: Diagnosis not present

## 2020-03-28 LAB — CBC WITH DIFFERENTIAL/PLATELET
Abs Immature Granulocytes: 0.25 10*3/uL — ABNORMAL HIGH (ref 0.00–0.07)
Basophils Absolute: 0 10*3/uL (ref 0.0–0.1)
Basophils Relative: 0 %
Eosinophils Absolute: 0.1 10*3/uL (ref 0.0–0.5)
Eosinophils Relative: 1 %
HCT: 38.4 % — ABNORMAL LOW (ref 39.0–52.0)
Hemoglobin: 12.6 g/dL — ABNORMAL LOW (ref 13.0–17.0)
Immature Granulocytes: 2 %
Lymphocytes Relative: 11 %
Lymphs Abs: 1.1 10*3/uL (ref 0.7–4.0)
MCH: 28.6 pg (ref 26.0–34.0)
MCHC: 32.8 g/dL (ref 30.0–36.0)
MCV: 87.1 fL (ref 80.0–100.0)
Monocytes Absolute: 0.8 10*3/uL (ref 0.1–1.0)
Monocytes Relative: 8 %
Neutro Abs: 8 10*3/uL — ABNORMAL HIGH (ref 1.7–7.7)
Neutrophils Relative %: 78 %
Platelets: 336 10*3/uL (ref 150–400)
RBC: 4.41 MIL/uL (ref 4.22–5.81)
RDW: 14.2 % (ref 11.5–15.5)
WBC: 10.3 10*3/uL (ref 4.0–10.5)
nRBC: 0 % (ref 0.0–0.2)

## 2020-03-28 LAB — COMPREHENSIVE METABOLIC PANEL
ALT: 22 U/L (ref 0–44)
AST: 16 U/L (ref 15–41)
Albumin: 2.5 g/dL — ABNORMAL LOW (ref 3.5–5.0)
Alkaline Phosphatase: 53 U/L (ref 38–126)
Anion gap: 7 (ref 5–15)
BUN: 15 mg/dL (ref 8–23)
CO2: 27 mmol/L (ref 22–32)
Calcium: 8.5 mg/dL — ABNORMAL LOW (ref 8.9–10.3)
Chloride: 98 mmol/L (ref 98–111)
Creatinine, Ser: 0.86 mg/dL (ref 0.61–1.24)
GFR calc Af Amer: >60 mL/min (ref >60)
GFR calc non Af Amer: >60 mL/min (ref >60)
Glucose, Bld: 137 mg/dL — ABNORMAL HIGH (ref 70–99)
Potassium: 3.7 mmol/L (ref 3.5–5.1)
Sodium: 132 mmol/L — ABNORMAL LOW (ref 135–145)
Total Bilirubin: 1.1 mg/dL (ref 0.3–1.2)
Total Protein: 6.6 g/dL (ref 6.5–8.1)

## 2020-03-28 LAB — C-REACTIVE PROTEIN: CRP: 13.1 mg/dL — ABNORMAL HIGH (ref <1.0)

## 2020-03-28 LAB — BRAIN NATRIURETIC PEPTIDE: B Natriuretic Peptide: 67.4 pg/mL (ref 0.0–100.0)

## 2020-03-28 LAB — GLUCOSE, CAPILLARY
Glucose-Capillary: 110 mg/dL — ABNORMAL HIGH (ref 70–99)
Glucose-Capillary: 127 mg/dL — ABNORMAL HIGH (ref 70–99)
Glucose-Capillary: 158 mg/dL — ABNORMAL HIGH (ref 70–99)
Glucose-Capillary: 138 mg/dL — ABNORMAL HIGH (ref 70–99)

## 2020-03-28 LAB — MAGNESIUM: Magnesium: 1.7 mg/dL (ref 1.7–2.4)

## 2020-03-28 LAB — CHOLESTEROL, BODY FLUID: Cholesterol, Fluid: 84 mg/dL

## 2020-03-28 LAB — PROCALCITONIN: Procalcitonin: 0.21 ng/mL

## 2020-03-28 LAB — SAR COV2 SEROLOGY (COVID19)AB(IGG),IA: SARS-CoV-2 Ab, IgG: REACTIVE — AB

## 2020-03-28 LAB — D-DIMER, QUANTITATIVE: D-Dimer, Quant: 5.45 ug/mL-FEU — ABNORMAL HIGH (ref 0.00–0.50)

## 2020-03-28 MED ORDER — CEFAZOLIN SODIUM-DEXTROSE 2-4 GM/100ML-% IV SOLN
2.0000 g | Freq: Three times a day (TID) | INTRAVENOUS | Status: DC
  Administered 2020-03-28 – 2020-03-29 (×5): 2 g via INTRAVENOUS
  Filled 2020-03-28 (×8): qty 100

## 2020-03-28 MED ORDER — ENOXAPARIN SODIUM 40 MG/0.4ML ~~LOC~~ SOLN
40.0000 mg | Freq: Two times a day (BID) | SUBCUTANEOUS | Status: DC
  Administered 2020-03-28 – 2020-03-31 (×6): 40 mg via SUBCUTANEOUS
  Filled 2020-03-28 (×6): qty 0.4

## 2020-03-28 MED ORDER — SODIUM CHLORIDE 0.9 % IV SOLN
1200.0000 mg | Freq: Once | INTRAVENOUS | Status: AC
  Administered 2020-03-28: 1200 mg via INTRAVENOUS
  Filled 2020-03-28: qty 10

## 2020-03-28 MED ORDER — POTASSIUM CHLORIDE CRYS ER 20 MEQ PO TBCR
40.0000 meq | EXTENDED_RELEASE_TABLET | Freq: Once | ORAL | Status: AC
  Administered 2020-03-28: 40 meq via ORAL
  Filled 2020-03-28: qty 2

## 2020-03-28 MED ORDER — METHYLPREDNISOLONE SODIUM SUCC 125 MG IJ SOLR: 125.0000 mg | Freq: Once | INTRAMUSCULAR | Status: DC | PRN

## 2020-03-28 MED ORDER — FAMOTIDINE IN NACL 20-0.9 MG/50ML-% IV SOLN
20.0000 mg | Freq: Once | INTRAVENOUS | Status: DC | PRN
  Filled 2020-03-28: qty 50

## 2020-03-28 MED ORDER — SODIUM CHLORIDE 0.9 % IV SOLN
INTRAVENOUS | Status: DC | PRN
  Administered 2020-03-28 – 2020-03-29 (×4): 250 mL via INTRAVENOUS

## 2020-03-28 MED ORDER — ALBUTEROL SULFATE HFA 108 (90 BASE) MCG/ACT IN AERS
2.0000 | INHALATION_SPRAY | Freq: Once | RESPIRATORY_TRACT | Status: AC | PRN
  Administered 2020-03-31: 2 via RESPIRATORY_TRACT
  Filled 2020-03-28: qty 6.7

## 2020-03-28 MED ORDER — MAGNESIUM SULFATE 2 GM/50ML IV SOLN
2.0000 g | Freq: Once | INTRAVENOUS | Status: AC
  Administered 2020-03-28: 2 g via INTRAVENOUS
  Filled 2020-03-28: qty 50

## 2020-03-28 MED ORDER — DIPHENHYDRAMINE HCL 50 MG/ML IJ SOLN: 50.0000 mg | Freq: Once | INTRAMUSCULAR | Status: DC | PRN

## 2020-03-28 MED ORDER — EPINEPHRINE 0.3 MG/0.3ML IJ SOAJ
0.3000 mg | Freq: Once | INTRAMUSCULAR | Status: DC | PRN
  Filled 2020-03-28: qty 0.6

## 2020-03-28 NOTE — Progress Notes (Signed)
Pt hs been on lovenox 60mg  SQ BID for his DVT prophylaxis. Ok to change dose to 0.5mg /kg BID due to ddimer >5 per Dr. Waldron [facility].   Pt has also been on ceftriaxone for his pan sensitive strep intermedius empyema. D/w Richardson Landry Minor of pulm and we will optimize his ceftriaxone to cefazolin high dose.   Cefazolin 2g IV q8  Onnie Boer, PharmD, [place], AAHIVP, CPP Infectious Disease Pharmacist 03/28/2020 11:47 AM

## 2020-03-28 NOTE — Progress Notes (Addendum)
NAME:  Mathew Hall, MRN:  [id number], DOB:  12-03-1940, LOS: 6 ADMISSION DATE:  03/21/2020, CONSULTATION DATE:  9/17 REFERRING MD:  Candiss Norse, CHIEF COMPLAINT:  Back pain   Brief History   79 y/o M admitted 9/15 with back pain (posterior mid-back), found to be incidentally positive for COVID.  Work up consistent with right sided pleural effusion.   Past Medical History  CAD s/p MI  HTN HLD GERD Asthma  Metastatic Non Small Cell lung cancer, adenocarcinoma.  Diagnosed 11/2008 with RML resection in 02/2009.  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  S/P 3 cycles chemo.   Significant Hospital Events   9/15 Admit   Consults:    Procedures:  9/18 Thoracentesis> 220 cc cloudy fluid removed, right pleural space 9/19 Chest tube placed (right) >>>  Significant Diagnostic Tests:  CT Angio Chest 9/17 >> loculated pleural effusion in R base, very mild centrilobular emphysema in upper lobes bilaterally, Compressive atelectasis vs consolidation R base, thickening of airways in R base  Micro Data:  COVID 9/16 >> positive  Pleural culture 9/17: strep intermedius  Antimicrobials:  CTX 9/19 >>  Azithro 9/19 >> 9/20 Wilfred Lacy 9/19>>  Interim history/subjective:  Sitting in chair wants to go home. 2 L nasal cannula sats 98%   Objective   Blood pressure 117/71, pulse 81, temperature 98.3 F (36.8 C), temperature source Oral, resp. rate 16, height 5\' 10"  (1.778 m), weight 76.4 kg, SpO2 96 %.        Intake/Output Summary (Last 24 hours) at 03/28/2020 1038 Last data filed at 03/28/2020 0748 Gross per 24 hour  Intake 260 ml  Output 550 ml  Net -290 ml   Filed Weights   03/24/20 0004 03/27/20 0505 03/28/20 0442  Weight: 76.7 kg 77.7 kg 76.4 kg    Examination: General: Alert and orientated male who is in no acute distress HEENT: No JVD or lymphadenopathy is appreciated Neuro: Grossly intact CV: Heart sounds  are regular  PULM: 2 L nasal cannula with O2 saturations of 98% Right chest tube in place with less than 60 cc drainage over 24 hours.  Faint crackles in the right base   GI: soft, bsx4 active  Extremities: warm/dry,  edema  Skin: no rashes or lesions   Resolved Hospital Problem list     Assessment & Plan:  Empyema due to CAP/aspiration: Strep intermedius on pleural fluid culture from [facility] 9/17. [place] with hyperechoic complicated loculated effusion. Chest tube placed for empyema 9/19  Continue ceftriaxone Chest tube drainage now down to approximately 60 cc per 24 hours therefore consideration to removing chest tube would be appropriate. Consider repeat CT not pleural fluid has been drained       RML adenocarcinoma s/p resection/chemo 2010 Consider repeat CT  COVID 19  Per primary  Best practice:   Per Primary  Labs   CBC: Recent Labs  Lab 03/24/20 0156 03/25/20 0330 03/26/20 0344 03/27/20 0159 03/28/20 0853  WBC 12.8* 14.4* 11.2* 8.6 10.3  NEUTROABS 11.5* 12.9* 9.4* 6.0 8.0*  HGB 11.2* 11.2* 11.0* 11.7* 12.6*  HCT 34.9* 34.3* 33.9* 36.4* 38.4*  MCV 87.5 88.2 88.3 87.3 87.1  PLT 235 245 273 277 967    Basic Metabolic Panel: Recent Labs  Lab 03/23/20 0251 03/23/20 0251 03/24/20 0156 03/25/20 0330 03/26/20 0344 03/27/20 0159 03/28/20 0853  NA 135   < > 136 135 136 136 132*  K 3.8   < > 3.6 4.3 4.0  4.2 3.7  CL 103   < > 103 101 104 102 98  CO2 22   < > 22 25 25 25 27   GLUCOSE 138*   < > 134* 156* 130* 94 137*  BUN 17   < > 25* 28* 26* 27* 15  CREATININE 0.93   < > 0.79 0.92 0.97 1.02 0.86  CALCIUM 9.1   < > 9.0 8.9 8.8* 8.4* 8.5*  MG 1.7   < > 1.9 1.8 1.9 1.7 1.7  PHOS 2.4*  --  2.9 3.1 3.5  --   --    < > = values in this interval not displayed.   GFR: Estimated Creatinine Clearance: 73.1 mL/min (by C-G formula based on SCr of 0.86 mg/dL). Recent Labs  Lab 03/22/20 1219 03/23/20 0251 03/24/20 0156 03/24/20 0156 03/25/20 0330 03/26/20 0344  03/27/20 0159 03/28/20 0853  PROCALCITON  --   --  5.21  --  2.93 1.18 0.45  --   WBC  --    < > 12.8*   < > 14.4* 11.2* 8.6 10.3  LATICACIDVEN 1.2  --   --   --   --   --   --   --    < > = values in this interval not displayed.    Liver Function Tests: Recent Labs  Lab 03/24/20 0156 03/25/20 0330 03/26/20 0344 03/27/20 0159 03/28/20 0853  AST 19 21 21 19 16   ALT 14 19 25 27 22   ALKPHOS 58 60 54 55 53  BILITOT 1.0 0.6 0.6 0.7 1.1  PROT 6.7  6.8 6.4* 6.3* 6.0* 6.6  ALBUMIN 2.5* 2.4* 2.3* 2.3* 2.5*   Recent Labs  Lab 03/21/20 2207  LIPASE 27   No results for input(s): AMMONIA in the last 168 hours.  ABG    Component Value Date/Time   PHART 7.429 03/06/2009 0437   PCO2ART 36.0 03/06/2009 0437   PO2ART 62.0 (L) 03/06/2009 0437   HCO3 23.9 03/06/2009 0437   TCO2 25 03/06/2009 0437   ACIDBASEDEF 0.5 03/01/2009 0922   O2SAT 93.0 03/06/2009 0437     Coagulation Profile: No results for input(s): INR, PROTIME in the last 168 hours.  Cardiac Enzymes: No results for input(s): CKTOTAL, CKMB, CKMBINDEX, TROPONINI in the last 168 hours.  HbA1C: Hgb A1c MFr Bld  Date/Time Value Ref Range Status  03/23/2020 09:03 AM 5.6 4.8 - 5.6 % Final    Comment:    (NOTE) Pre diabetes:          5.7%-6.4%  Diabetes:              >6.4%  Glycemic control for   <7.0% adults with diabetes     CBG: Recent Labs  Lab 03/27/20 0721 03/27/20 1207 03/27/20 1609 03/27/20 2129 03/28/20 0727  GLUCAP 95 103* 109* 123* 110*     Critical care time: n/a     Richardson Landry Diora Bellizzi ACNP Acute Care Nurse Practitioner [place] Please consult [place] 03/28/2020, 10:38 AM

## 2020-03-28 NOTE — Progress Notes (Signed)
PROGRESS NOTE                                                                                                                                                                                                             Patient Demographics:    Mathew Hall, is a 79 y.o. male, DOB - 1940-10-22, ZOX:[id number]  Outpatient Primary MD for the patient is [name], MD    LOS - 6  Admit date - 03/21/2020    Chief Complaint  Patient presents with  . Back Pain       Brief Narrative - Mathew Hall is a 79 y.o. male with medical history significant of lung CA status post partial resection, hypertension, HLD, GERD, CAD with MI remote, presented with worsening back pain and cough.  Patient did eat grass cutting 2 days ago, and he said that usually after doing grass cutting we will have back pain for a day or 2.  ER work-up shows incidental COVID-19 infection along with a partially loculated right-sided pleural effusion and he was admitted to the hospital.   Subjective:   Patient in bed, appears comfortable, denies any headache, no fever, no chest pain or pressure, no shortness of breath , no abdominal pain. No focal weakness.   Assessment  & Plan :   Incidental COVID-19 Infection in a patient is fully vaccinated.   - Although his CRP is elevated he has no hypoxia or oxygen demand at rest, CRP most likely elevated in the setting of empyema . -has finished his short course of steroids and Remdesivir and this problem seems to have resolved.  Encouraged the patient to sit up in chair in the daytime use I-S and flutter valve for pulmonary toiletry and then prone in bed when at night.  Will advance activity and titrate down oxygen as possible.   SpO2: 96 % O2 Flow Rate (L/min): 2 L/min  Recent Labs  Lab 03/22/20 1219 03/22/20 1318 03/22/20 2106 03/24/20 0156 03/25/20 0330 03/26/20 0344 03/27/20 0159 03/28/20 0830  03/28/20 0853  WBC  --   --    < > 12.8* 14.4* 11.2* 8.6  --  10.3  PLT  --   --    < > 235 245 273 277  --  336  CRP  --   --    < >  26.6* 16.9* 10.2* 6.8*  --  13.1*  BNP  --   --    < > 344.5* 235.3* 240.7* 143.0* 67.4  --   DDIMER  --   --    < > 2.90* 2.09* 2.17* 3.67*  --  5.45*  PROCALCITON  --   --   --  5.21 2.93 1.18 0.45  --  0.21  AST  --   --    < > 19 21 21 19   --  16  ALT  --   --    < > 14 19 25 27   --  22  ALKPHOS  --   --    < > 58 60 54 55  --  53  BILITOT  --   --    < > 1.0 0.6 0.6 0.7  --  1.1  ALBUMIN  --   --    < > 2.5* 2.4* 2.3* 2.3*  --  2.5*  LATICACIDVEN 1.2  --   --   --   --   --   --   --   --   SARSCOV2NAA  --  POSITIVE*  --   --   --   --   --   --   --    < > = values in this interval not displayed.    History of  Metastatic nonsmall cell lung cancer, adenocarcinoma Right Lung cancer in the past ( 15 yrs ago, Dr Inda Merlin)  With status post neoadjuvant chemotherapy followed by right middle lobectomy with lymph node dissection  &  now with partially loculated right-sided pleural effusion which proved to be empyema secondary to likely chronic subclinical aspiration.  Did have elevated CRP along with procalcitonin, pleural fluid was exudative and follow studies and clinical evaluation by pulmonary critical care suggested that he likely had empyema versus parapneumonic effusion, underwent chest tube placement on the right side on 03/25/2020, continue IV Rocephin, pleural fluid growing Rocephin sensitive Streptococcus intermedius.  We will continue to monitor.  -Pathology report with no evidence of malignancy. -Will require outpatient pulmonary follow-up as well. -Chest tube management per PCCM  Borderline elevated D-dimer.   -Continue with Lovenox 0.5 meg per cake twice daily. -Negative leg ultrasound  Essential hypertension.  On Norvasc, and a 3-second asymptomatic pause on telemetry on 03/27/2020 early morning, beta-blocker stopped.  As needed hydralazine  continue,.  History of CAD.  Chest pain-free no acute issues, on beta-blocker, aspirin and statin for secondary infection.  Continue.  Dehydration.  Has been adequately hydrated with IV fluids.  Right-sided parapneumonic effusion/empyema question silent aspiration.  Cleared by speech continue with supportive care as in #2 above.  3 GERD asymptomatic sinus pause on telemetry.  Discontinued beta-blocker, check TSH and monitor.     Condition -   Guarded  Family Communication  : Wife Muckey [phone] on 03/23/2020, 03/27/2020 - message left at 11:17 AM.  Code Status :  Full  Consults  :  Pulm  Procedures  :    Right-sided chest tube placement on 03/25/2020 by pulmonary.   Right-sided ultrasound-guided thoracentesis on 03/23/2020.  Exudative fluid.  Cytology pending.    Lower extremity venous duplex.  No DVT.    CT - No evidence of pulmonary embolism. Partially loculated RIGHT pleural effusion with compressive atelectasis of the RIGHT lower lobe. Prior RIGHT middle lobe resection. Single minimally enlarged precarinal lymph node 11 mm short axis. No acute intra-abdominal or intrapelvic abnormalities. Prostatic enlargement. BILATERAL inguinal  hernias containing fat larger on RIGHT. Cholelithiasis. Aortic Atherosclerosis (ICD10-I70.0) and Emphysema (ICD10-J43.9).  PUD Prophylaxis : PPI  Disposition Plan  :    Status is: Inpatient  Remains inpatient appropriate because:IV treatments appropriate due to intensity of illness or inability to take PO   Dispo: The patient is from: Home              Anticipated d/c is to: Home              Anticipated d/c date is: 3 days              Patient currently is not medically stable to d/c.   DVT Prophylaxis  :  Lovenox    Lab Results  Component Value Date   PLT 336 03/28/2020    Diet :  Diet Order            Diet regular Room service appropriate? Yes; Fluid consistency: Thin  Diet effective now                  Inpatient  Medications  Scheduled Meds: . amLODipine  5 mg Oral Daily  . aspirin  325 mg Oral Daily  . enoxaparin (LOVENOX) injection  40 mg Subcutaneous Q12H  . insulin aspart  0-5 Units Subcutaneous QHS  . insulin aspart  0-9 Units Subcutaneous TID WC  . ipratropium  2 puff Inhalation Q6H  . pantoprazole  40 mg Oral Daily  . simvastatin  40 mg Oral q1800   Continuous Infusions: .  ceFAZolin (ANCEF) IV Stopped (03/28/20 1343)   PRN Meds:.acetaminophen, guaiFENesin-dextromethorphan, hydrALAZINE, [DISCONTINUED] ondansetron **OR** ondansetron (ZOFRAN) IV, oxybutynin  Antibiotics  :    Anti-infectives (From admission, onward)   Start     Dose/Rate Route Frequency Ordered Stop   03/28/20 1400  ceFAZolin (ANCEF) IVPB 2g/100 mL premix        2 g 200 mL/hr over 30 Minutes Intravenous Every 8 hours 03/28/20 1148     03/24/20 1500  cefTRIAXone (ROCEPHIN) 2 g in sodium chloride 0.9 % 100 mL IVPB  Status:  Discontinued        2 g 200 mL/hr over 30 Minutes Intravenous Every 24 hours 03/24/20 1446 03/28/20 1148   03/24/20 1500  azithromycin (ZITHROMAX) 500 mg in sodium chloride 0.9 % 250 mL IVPB  Status:  Discontinued        500 mg 250 mL/hr over 60 Minutes Intravenous Every 24 hours 03/24/20 1446 03/26/20 1141   03/23/20 1000  remdesivir 100 mg in sodium chloride 0.9 % 100 mL IVPB       "Followed by" Linked Group Details   100 mg 200 mL/hr over 30 Minutes Intravenous Daily 03/22/20 1806 03/26/20 2022   03/22/20 1845  remdesivir 200 mg in sodium chloride 0.9% 250 mL IVPB       "Followed by" Linked Group Details   200 mg 580 mL/hr over 30 Minutes Intravenous Once 03/22/20 1806 03/22/20 2025        Wynetta Seith M.D on 03/28/2020 at 4:07 PM  To page go to [email or web address] -  [facility] -  Office  [phone]     See all Orders from today for further details    Objective:   Vitals:   03/27/20 2139 03/28/20 0442 03/28/20 0748 03/28/20 1354  BP: 116/61 117/71  112/77  Pulse: 73  81  91  Resp: 16 16  20   Temp: (!) 97.4 F (36.3 C) 98.3 F (36.8 C)  97.6 F (36.4  C)  TempSrc: Oral Oral  Axillary  SpO2: 97% 96% 96% 96%  Weight:  76.4 kg    Height:        Wt Readings from Last 3 Encounters:  03/28/20 76.4 kg  09/02/19 78.9 kg  08/01/19 79.2 kg     Intake/Output Summary (Last 24 hours) at 03/28/2020 1607 Last data filed at 03/28/2020 1314 Gross per 24 hour  Intake 120 ml  Output 750 ml  Net -630 ml     Physical Exam  Awake Alert, Oriented X 3, No new F.N deficits, Normal affect Symmetrical Chest wall movement, Good air movement bilaterally, he has right-sided chest tube RRR,No Gallops,Rubs or new Murmurs, No Parasternal Heave +ve B.Sounds, Abd Soft, No tenderness, No rebound - guarding or rigidity. No Cyanosis, Clubbing or edema, No new Rash or bruise      Data Review:    CBC Recent Labs  Lab 03/24/20 0156 03/25/20 0330 03/26/20 0344 03/27/20 0159 03/28/20 0853  WBC 12.8* 14.4* 11.2* 8.6 10.3  HGB 11.2* 11.2* 11.0* 11.7* 12.6*  HCT 34.9* 34.3* 33.9* 36.4* 38.4*  PLT 235 245 273 277 336  MCV 87.5 88.2 88.3 87.3 87.1  MCH 28.1 28.8 28.6 28.1 28.6  MCHC 32.1 32.7 32.4 32.1 32.8  RDW 14.0 14.2 14.2 14.1 14.2  LYMPHSABS 0.5* 0.7 0.9 1.5 1.1  MONOABS 0.6 0.7 0.9 0.9 0.8  EOSABS 0.0 0.0 0.0 0.0 0.1  BASOSABS 0.0 0.0 0.0 0.0 0.0    Recent Labs  Lab 03/22/20 1219 03/22/20 2106 03/23/20 0903 03/23/20 0913 03/24/20 0156 03/25/20 0330 03/26/20 0344 03/27/20 0159 03/28/20 0830 03/28/20 0853  NA  --    < >  --   --  136 135 136 136  --  132*  K  --    < >  --   --  3.6 4.3 4.0 4.2  --  3.7  CL  --    < >  --   --  103 101 104 102  --  98  CO2  --    < >  --   --  22 25 25 25   --  27  GLUCOSE  --    < >  --   --  134* 156* 130* 94  --  137*  BUN  --    < >  --   --  25* 28* 26* 27*  --  15  CREATININE  --    < >  --   --  0.79 0.92 0.97 1.02  --  0.86  CALCIUM  --    < >  --   --  9.0 8.9 8.8* 8.4*  --  8.5*  AST  --    < >  --    --  19 21 21 19   --  16  ALT  --    < >  --   --  14 19 25 27   --  22  ALKPHOS  --    < >  --   --  58 60 54 55  --  53  BILITOT  --    < >  --   --  1.0 0.6 0.6 0.7  --  1.1  ALBUMIN  --    < >  --   --  2.5* 2.4* 2.3* 2.3*  --  2.5*  MG  --    < >  --   --  1.9 1.8 1.9 1.7  --  1.7  CRP  --    < >  --   --  26.6* 16.9* 10.2* 6.8*  --  13.1*  DDIMER  --    < >  --   --  2.90* 2.09* 2.17* 3.67*  --  5.45*  PROCALCITON  --   --   --   --  5.21 2.93 1.18 0.45  --  0.21  LATICACIDVEN 1.2  --   --   --   --   --   --   --   --   --   TSH  --   --   --   --   --   --   --  1.228  --   --   HGBA1C  --   --  5.6  --   --   --   --   --   --   --   BNP  --   --   --    < > 344.5* 235.3* 240.7* 143.0* 67.4  --    < > = values in this interval not displayed.    ------------------------------------------------------------------------------------------------------------------ No results for input(s): CHOL, HDL, LDLCALC, TRIG, CHOLHDL, LDLDIRECT in the last 72 hours.  Lab Results  Component Value Date   HGBA1C 5.6 03/23/2020   ------------------------------------------------------------------------------------------------------------------ Recent Labs    03/27/20 0159  TSH 1.228    Cardiac Enzymes No results for input(s): CKMB, TROPONINI, MYOGLOBIN in the last 168 hours.  Invalid input(s): CK ------------------------------------------------------------------------------------------------------------------    Component Value Date/Time   BNP 67.4 03/28/2020 0830    Micro Results Recent Results (from the past 240 hour(s))  SARS Coronavirus 2 by RT PCR (hospital order, performed in [facility] hospital lab) Nasopharyngeal Nasopharyngeal Swab     Status: Abnormal   Collection Time: 03/22/20  1:18 PM   Specimen: Nasopharyngeal Swab  Result Value Ref Range Status   SARS Coronavirus 2 POSITIVE (A) NEGATIVE Final    Comment: RESULT CALLED TO, READ BACK BY AND VERIFIED WITHKeturah Barre RN  1528 03/22/20 A BROWNING (NOTE) SARS-CoV-2 target nucleic acids are DETECTED  SARS-CoV-2 RNA is generally detectable in upper respiratory specimens  during the acute phase of infection.  Positive results are indicative  of the presence of the identified virus, but do not rule out bacterial infection or co-infection with other pathogens not detected by the test.  Clinical correlation with patient history and  other diagnostic information is necessary to determine patient infection status.  The expected result is negative.  Fact Sheet for Patients:   StrictlyIdeas.no   Fact Sheet for Healthcare Providers:   BankingDealers.co.za    This test is not yet approved or cleared by the [place] FDA and  has been authorized for detection and/or diagnosis of SARS-CoV-2 by FDA under an Emergency Use Authorization (EUA).  This EUA will remain in effect (meaning this  test can be used) for the duration of  the COVID-19 declaration under Section 564(b)(1) of the Act, 21 U.S.C. section 360-bbb-3(b)(1), unless the authorization is terminated or revoked sooner.  Performed at [facility], [place] [address], [place], [place] [address]   Body fluid culture     Status: None   Collection Time: 03/23/20  3:08 PM   Specimen: Pleura; Body Fluid  Result Value Ref Range Status   Specimen Description PLEURAL  Final   Special Requests PLEURAL R  Final   Gram Stain   Final    ABUNDANT WBC PRESENT,  PREDOMINANTLY PMN NO ORGANISMS SEEN    Culture   Final    RARE STREPTOCOCCUS INTERMEDIUS CRITICAL RESULT CALLED TO, READ BACK BY AND VERIFIED WITH: RN Sherlyn Lick [phone] FCP Performed at [facility], [place] [address] [place], [place] [address]    Report Status 03/27/2020 FINAL  Final   Organism ID, Bacteria STREPTOCOCCUS INTERMEDIUS  Final      Susceptibility   Streptococcus intermedius - MIC*    PENICILLIN <=0.06 SENSITIVE Sensitive      CEFTRIAXONE <=0.12 SENSITIVE Sensitive     ERYTHROMYCIN <=0.12 SENSITIVE Sensitive     LEVOFLOXACIN 0.5 SENSITIVE Sensitive     VANCOMYCIN 0.5 SENSITIVE Sensitive     * RARE STREPTOCOCCUS INTERMEDIUS    Radiology Reports DG Chest 1 View  Result Date: 03/25/2020 CLINICAL DATA:  Chest tube in place EXAM: CHEST  1 VIEW COMPARISON:  March 23, 2020 FINDINGS: A pigtail right chest tube is been placed. The right-sided pleural effusion is smaller although effusion and underlying opacity does persist. Postoperative changes in the right base. No pneumothorax. The left lung is clear. The cardiomediastinal silhouette is stable. IMPRESSION: 1. A chest tube is been placed on the right. The right-sided pleural effusion persists but is smaller. Electronically Signed   By: Dorise Bullion III M.D   On: 03/25/2020 19:50   DG Chest 2 View  Result Date: 03/21/2020 CLINICAL DATA:  79 year old male with cough and back pain. Former smoker. Previous right middle lobectomy. EXAM: CHEST - 2 VIEW COMPARISON:  Chest CT 07/29/2019 and earlier. FINDINGS: Right lung volume loss and architectural distortion similar to the January CT. However, with increased streaky and indistinct right lung base opacity since that time. Left lung volume is stable. No superimposed pneumothorax. No definite pleural effusion. No pulmonary edema. Mediastinal contours are stable and within normal limits. Visualized tracheal air column is within normal limits. No acute osseous abnormality identified. Negative visible bowel gas pattern. IMPRESSION: Sequelae of prior right middle lobectomy, but with superimposed new interstitial opacity in the right lower lung. Favor acute viral/atypical respiratory infection. No definite pleural effusion. Recommend Followup PA and lateral chest X-ray, or alternatively repeat restaging Chest CT, following therapy to ensure resolution and exclude recurrent malignancy. Electronically Signed   By: Genevie Ann M.D.   On:  03/21/2020 22:26   CT Angio Chest PE W and/or Wo Contrast  Result Date: 03/22/2020 CLINICAL DATA:  Abdominal pain, pleuritic RIGHT chest pain, history lung cancer post resection, possible RIGHT groin hernia, prior abnormal CT chest EXAM: CT ANGIOGRAPHY CHEST CT ABDOMEN AND PELVIS WITH CONTRAST TECHNIQUE: Multidetector CT imaging of the chest was performed using the standard protocol during bolus administration of intravenous contrast. Multiplanar CT image reconstructions and MIPs were obtained to evaluate the vascular anatomy. Multidetector CT imaging of the abdomen and pelvis was performed using the standard protocol during bolus administration of intravenous contrast. CONTRAST:  134mL OMNIPAQUE IOHEXOL 350 MG/ML SOLN IV. No oral contrast. COMPARISON:  CT chest 07/29/2019 FINDINGS: CTA CHEST FINDINGS Cardiovascular: Atherosclerotic calcifications of thoracic aorta and coronary arteries. Aorta normal caliber without aneurysm or dissection. Heart unremarkable. No pericardial effusion. Pulmonary arteries adequately opacified. Scattered respiratory motion artifacts at the lower lobes. No definite pulmonary emboli identified. Mediastinum/Nodes: Esophagus unremarkable. Base of cervical region normal appearance. 11 mm precarinal node slightly increased from previous exam. Additional small normal size mediastinal lymph nodes without additional thoracic adenopathy. Lungs/Pleura: Partially loculated RIGHT pleural effusion. Compressive atelectasis of RIGHT lower lobe. Prior RIGHT middle lobe  resection. Loculated fluid in RIGHT major fissure posteriorly. Underlying emphysematous changes. No acute infiltrate, pleural effusion or pneumothorax. Small focus of scarring in the RIGHT upper lobe unchanged image 29. Musculoskeletal: No acute osseous findings. Review of the MIP images confirms the above findings. CT ABDOMEN and PELVIS FINDINGS Hepatobiliary: Calcified gallstone dependently in gallbladder measuring 5 mm diameter.  Gallbladder and liver otherwise normal appearance. Pancreas: Normal appearance Spleen: Normal appearance Adrenals/Urinary Tract: Adrenal glands kidneys, ureters, and bladder normal appearance Stomach/Bowel: Short segment of appendiceal base normal appearance. Stomach and bowel loops normal appearance. Vascular/Lymphatic: Atherosclerotic calcifications of aortic and iliac arteries without aneurysm. No adenopathy. Reproductive: Prostatic enlargement gland measuring 6.5 x 6.5 x 5.5 cm (volume = 120 cm^3) Other: BILATERAL inguinal hernias containing fat larger on RIGHT. No free air or free fluid. No inflammatory process. Tiny umbilical hernia containing fat. Musculoskeletal: Bones demineralized. Degenerative disc disease changes thoracolumbar spine. Review of the MIP images confirms the above findings. IMPRESSION: No evidence of pulmonary embolism. Partially loculated RIGHT pleural effusion with compressive atelectasis of the RIGHT lower lobe. Prior RIGHT middle lobe resection. Single minimally enlarged precarinal lymph node 11 mm short axis. No acute intra-abdominal or intrapelvic abnormalities. Prostatic enlargement. BILATERAL inguinal hernias containing fat larger on RIGHT. Cholelithiasis. Aortic Atherosclerosis (ICD10-I70.0) and Emphysema (ICD10-J43.9). Electronically Signed   By: Lavonia Dana M.D.   On: 03/22/2020 15:03   CT ABDOMEN PELVIS W CONTRAST  Result Date: 03/22/2020 CLINICAL DATA:  Abdominal pain, pleuritic RIGHT chest pain, history lung cancer post resection, possible RIGHT groin hernia, prior abnormal CT chest EXAM: CT ANGIOGRAPHY CHEST CT ABDOMEN AND PELVIS WITH CONTRAST TECHNIQUE: Multidetector CT imaging of the chest was performed using the standard protocol during bolus administration of intravenous contrast. Multiplanar CT image reconstructions and MIPs were obtained to evaluate the vascular anatomy. Multidetector CT imaging of the abdomen and pelvis was performed using the standard protocol  during bolus administration of intravenous contrast. CONTRAST:  149mL OMNIPAQUE IOHEXOL 350 MG/ML SOLN IV. No oral contrast. COMPARISON:  CT chest 07/29/2019 FINDINGS: CTA CHEST FINDINGS Cardiovascular: Atherosclerotic calcifications of thoracic aorta and coronary arteries. Aorta normal caliber without aneurysm or dissection. Heart unremarkable. No pericardial effusion. Pulmonary arteries adequately opacified. Scattered respiratory motion artifacts at the lower lobes. No definite pulmonary emboli identified. Mediastinum/Nodes: Esophagus unremarkable. Base of cervical region normal appearance. 11 mm precarinal node slightly increased from previous exam. Additional small normal size mediastinal lymph nodes without additional thoracic adenopathy. Lungs/Pleura: Partially loculated RIGHT pleural effusion. Compressive atelectasis of RIGHT lower lobe. Prior RIGHT middle lobe resection. Loculated fluid in RIGHT major fissure posteriorly. Underlying emphysematous changes. No acute infiltrate, pleural effusion or pneumothorax. Small focus of scarring in the RIGHT upper lobe unchanged image 29. Musculoskeletal: No acute osseous findings. Review of the MIP images confirms the above findings. CT ABDOMEN and PELVIS FINDINGS Hepatobiliary: Calcified gallstone dependently in gallbladder measuring 5 mm diameter. Gallbladder and liver otherwise normal appearance. Pancreas: Normal appearance Spleen: Normal appearance Adrenals/Urinary Tract: Adrenal glands kidneys, ureters, and bladder normal appearance Stomach/Bowel: Short segment of appendiceal base normal appearance. Stomach and bowel loops normal appearance. Vascular/Lymphatic: Atherosclerotic calcifications of aortic and iliac arteries without aneurysm. No adenopathy. Reproductive: Prostatic enlargement gland measuring 6.5 x 6.5 x 5.5 cm (volume = 120 cm^3) Other: BILATERAL inguinal hernias containing fat larger on RIGHT. No free air or free fluid. No inflammatory process. Tiny  umbilical hernia containing fat. Musculoskeletal: Bones demineralized. Degenerative disc disease changes thoracolumbar spine. Review of the MIP images confirms the above findings. IMPRESSION:  No evidence of pulmonary embolism. Partially loculated RIGHT pleural effusion with compressive atelectasis of the RIGHT lower lobe. Prior RIGHT middle lobe resection. Single minimally enlarged precarinal lymph node 11 mm short axis. No acute intra-abdominal or intrapelvic abnormalities. Prostatic enlargement. BILATERAL inguinal hernias containing fat larger on RIGHT. Cholelithiasis. Aortic Atherosclerosis (ICD10-I70.0) and Emphysema (ICD10-J43.9). Electronically Signed   By: Lavonia Dana M.D.   On: 03/22/2020 15:03   DG Chest Port 1 View  Result Date: 03/28/2020 CLINICAL DATA:  Empyema EXAM: PORTABLE CHEST 1 VIEW COMPARISON:  03/27/2020 FINDINGS: Right basilar chest tube remains in place. No visible pneumothorax. Postoperative changes on the right. Right basilar atelectasis. Left lung clear. Heart is normal size. IMPRESSION: Postoperative changes on the right with right base atelectasis. No visible pneumothorax. Electronically Signed   By: Rolm Baptise M.D.   On: 03/28/2020 08:41   DG Chest Port 1 View  Result Date: 03/27/2020 CLINICAL DATA:  Empyema.  Chest tube.  COVID. EXAM: PORTABLE CHEST 1 VIEW COMPARISON:  03/26/2020. FINDINGS: Right chest tube in stable position. No pneumothorax. Mediastinum hilar structures normal. Persistent right base atelectasis/infiltrate. Persistent small right pleural effusion. Small amount of fluid may be in the right major fissure. Interim improvement in aeration in the left lung base. Stable deformity of the right posterior seventh rib. Degenerative changes scoliosis thoracic spine. IMPRESSION: 1.  Right chest tube in stable position.  No pneumothorax. 2. Persistent right base atelectasis/infiltrate. Persistent small right pleural effusion. Small amount of fluid may be present in the  right major fissure. Similar findings noted on prior exam. Interim improvement in aeration in the left lung base. Electronically Signed   By: Marcello Moores  Register   On: 03/27/2020 06:27   DG Chest Port 1 View  Result Date: 03/26/2020 CLINICAL DATA:  Reason for exam: chest tube in place EXAM: PORTABLE CHEST - 1 VIEW COMPARISON:  the previous day's study FINDINGS: Stable pigtail chest tube at the right lung base. No pneumothorax. Patchy atelectasis/consolidation at the lung bases right greater than left. Staple lines in the right mid lung. Heart size normal.  Aortic Atherosclerosis (ICD10-170.0). There may be a small loculated right pleural effusion laterally at the base. Previous right thoracotomy. IMPRESSION: Stable right chest tube with no pneumothorax. Electronically Signed   By: Lucrezia Europe M.D.   On: 03/26/2020 07:03   DG CHEST PORT 1 VIEW  Result Date: 03/23/2020 CLINICAL DATA:  Status post thoracentesis. EXAM: PORTABLE CHEST 1 VIEW COMPARISON:  03/21/2020 and chest CT, 03/22/2020. FINDINGS: There is there is opacity at the right lung base at is similar to the previous day's CT scan, consistent with a combination of residual fluid and atelectasis. Postsurgical changes are noted with right mid to lower lung anastomosis staples in the few surgical vascular clips. No pneumothorax. Left lung is hyperexpanded but clear. IMPRESSION: 1. No pneumothorax or evidence of a complication following thoracentesis. 2. Persistent opacity at the right lung base consistent with a combination of residual pleural fluid and atelectasis. Postsurgical changes in the right lower lung are unchanged from the previous chest radiograph. Electronically Signed   By: Lajean Manes M.D.   On: 03/23/2020 14:54   VAS [place] LOWER EXTREMITY VENOUS (DVT)  Result Date: 03/23/2020  Lower Venous DVTStudy Indications: + Covid, and Swelling.  Risk Factors: None identified. Performing Technologist: Griffin Basil RCT RDMS  Examination Guidelines: A  complete evaluation includes B-mode imaging, spectral Doppler, color Doppler, and power Doppler as needed of all accessible portions of each vessel. Bilateral testing  is considered an integral part of a complete examination. Limited examinations for reoccurring indications may be performed as noted. The reflux portion of the exam is performed with the patient in reverse Trendelenburg.  +---------+---------------+---------+-----------+----------+--------------+ RIGHT    CompressibilityPhasicitySpontaneityPropertiesThrombus Aging +---------+---------------+---------+-----------+----------+--------------+ CFV      Full           Yes      Yes                                 +---------+---------------+---------+-----------+----------+--------------+ SFJ      Full                                                        +---------+---------------+---------+-----------+----------+--------------+ FV Prox  Full                                                        +---------+---------------+---------+-----------+----------+--------------+ FV Mid   Full                                                        +---------+---------------+---------+-----------+----------+--------------+ FV DistalFull                                                        +---------+---------------+---------+-----------+----------+--------------+ PFV      Full                                                        +---------+---------------+---------+-----------+----------+--------------+ POP      Full           Yes      Yes                                 +---------+---------------+---------+-----------+----------+--------------+ PTV      Full                                                        +---------+---------------+---------+-----------+----------+--------------+ PERO     Full                                                         +---------+---------------+---------+-----------+----------+--------------+   +---------+---------------+---------+-----------+----------+--------------+ LEFT     CompressibilityPhasicitySpontaneityPropertiesThrombus Aging +---------+---------------+---------+-----------+----------+--------------+ CFV  Full           Yes      Yes                                 +---------+---------------+---------+-----------+----------+--------------+ SFJ      Full                                                        +---------+---------------+---------+-----------+----------+--------------+ FV Prox  Full                                                        +---------+---------------+---------+-----------+----------+--------------+ FV Mid   Full                                                        +---------+---------------+---------+-----------+----------+--------------+ FV DistalFull                                                        +---------+---------------+---------+-----------+----------+--------------+ PFV      Full                                                        +---------+---------------+---------+-----------+----------+--------------+ POP      Full           Yes      Yes                                 +---------+---------------+---------+-----------+----------+--------------+ PTV      Full                                                        +---------+---------------+---------+-----------+----------+--------------+ PERO     Full                                                        +---------+---------------+---------+-----------+----------+--------------+     Summary: RIGHT: - There is no evidence of deep vein thrombosis in the lower extremity.  - No cystic structure found in the popliteal fossa.  LEFT: - There is no evidence of deep vein thrombosis in the lower extremity.  - No cystic structure found in the popliteal fossa.   *See table(s) above for measurements and  observations. Electronically signed by Servando Snare MD on 03/23/2020 at 12:08:48 PM.    Final

## 2020-03-29 ENCOUNTER — Inpatient Hospital Stay (HOSPITAL_COMMUNITY): Payer: Medicare Other

## 2020-03-29 LAB — COMPREHENSIVE METABOLIC PANEL
ALT: 21 U/L (ref 0–44)
AST: 16 U/L (ref 15–41)
Albumin: 2.2 g/dL — ABNORMAL LOW (ref 3.5–5.0)
Alkaline Phosphatase: 52 U/L (ref 38–126)
Anion gap: 10 (ref 5–15)
BUN: 14 mg/dL (ref 8–23)
CO2: 25 mmol/L (ref 22–32)
Calcium: 8.3 mg/dL — ABNORMAL LOW (ref 8.9–10.3)
Chloride: 97 mmol/L — ABNORMAL LOW (ref 98–111)
Creatinine, Ser: 0.96 mg/dL (ref 0.61–1.24)
GFR calc Af Amer: >60 mL/min (ref >60)
GFR calc non Af Amer: >60 mL/min (ref >60)
Glucose, Bld: 125 mg/dL — ABNORMAL HIGH (ref 70–99)
Potassium: 3.6 mmol/L (ref 3.5–5.1)
Sodium: 132 mmol/L — ABNORMAL LOW (ref 135–145)
Total Bilirubin: 1.1 mg/dL (ref 0.3–1.2)
Total Protein: 6 g/dL — ABNORMAL LOW (ref 6.5–8.1)

## 2020-03-29 LAB — CBC WITH DIFFERENTIAL/PLATELET
Abs Immature Granulocytes: 0.36 10*3/uL — ABNORMAL HIGH (ref 0.00–0.07)
Basophils Absolute: 0 10*3/uL (ref 0.0–0.1)
Basophils Relative: 0 %
Eosinophils Absolute: 0.1 10*3/uL (ref 0.0–0.5)
Eosinophils Relative: 1 %
HCT: 33.9 % — ABNORMAL LOW (ref 39.0–52.0)
Hemoglobin: 11.3 g/dL — ABNORMAL LOW (ref 13.0–17.0)
Immature Granulocytes: 4 %
Lymphocytes Relative: 16 %
Lymphs Abs: 1.5 10*3/uL (ref 0.7–4.0)
MCH: 29 pg (ref 26.0–34.0)
MCHC: 33.3 g/dL (ref 30.0–36.0)
MCV: 87.1 fL (ref 80.0–100.0)
Monocytes Absolute: 0.8 10*3/uL (ref 0.1–1.0)
Monocytes Relative: 9 %
Neutro Abs: 6.4 10*3/uL (ref 1.7–7.7)
Neutrophils Relative %: 70 %
Platelets: 306 10*3/uL (ref 150–400)
RBC: 3.89 MIL/uL — ABNORMAL LOW (ref 4.22–5.81)
RDW: 14 % (ref 11.5–15.5)
WBC: 9.2 10*3/uL (ref 4.0–10.5)
nRBC: 0 % (ref 0.0–0.2)

## 2020-03-29 LAB — GLUCOSE, CAPILLARY
Glucose-Capillary: 101 mg/dL — ABNORMAL HIGH (ref 70–99)
Glucose-Capillary: 119 mg/dL — ABNORMAL HIGH (ref 70–99)
Glucose-Capillary: 136 mg/dL — ABNORMAL HIGH (ref 70–99)

## 2020-03-29 LAB — BRAIN NATRIURETIC PEPTIDE: B Natriuretic Peptide: 50.5 pg/mL (ref 0.0–100.0)

## 2020-03-29 LAB — PROCALCITONIN: Procalcitonin: 0.17 ng/mL

## 2020-03-29 LAB — MAGNESIUM: Magnesium: 2.5 mg/dL — ABNORMAL HIGH (ref 1.7–2.4)

## 2020-03-29 LAB — C-REACTIVE PROTEIN: CRP: 12.5 mg/dL — ABNORMAL HIGH (ref <1.0)

## 2020-03-29 LAB — D-DIMER, QUANTITATIVE: D-Dimer, Quant: 6.69 ug/mL-FEU — ABNORMAL HIGH (ref 0.00–0.50)

## 2020-03-29 MED ORDER — ASPIRIN EC 81 MG PO TBEC
81.0000 mg | DELAYED_RELEASE_TABLET | Freq: Every day | ORAL | Status: DC
  Administered 2020-03-29 – 2020-03-31 (×3): 81 mg via ORAL
  Filled 2020-03-29 (×3): qty 1

## 2020-03-29 NOTE — Progress Notes (Signed)
NAME:  Mathew Hall, MRN:  [id number], DOB:  [birth year], LOS: 7 ADMISSION DATE:  03/21/2020, CONSULTATION DATE:  9/17 REFERRING MD:  Candiss Norse, CHIEF COMPLAINT:  Back pain   Brief History   79 y/o M admitted 9/15 with back pain (posterior mid-back), found to be incidentally positive for COVID.  Work up consistent with right sided pleural effusion.   Past Medical History  CAD s/p MI  HTN HLD GERD Asthma  Metastatic Non Small Cell lung cancer, adenocarcinoma.  Diagnosed 11/2008 with RML resection in 02/2009.  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  S/P 3 cycles chemo.   Significant Hospital Events   9/15 Admit   Consults:    Procedures:  9/18 Thoracentesis> 220 cc cloudy fluid removed, right pleural space 9/19 Chest tube placed (right) >>>  Significant Diagnostic Tests:  CT Angio Chest 9/17 >> loculated pleural effusion in R base, very mild centrilobular emphysema in upper lobes bilaterally, Compressive atelectasis vs consolidation R base, thickening of airways in R base  Micro Data:  COVID 9/16 >> positive  Pleural culture 9/17: strep intermedius  Antimicrobials:  CTX 9/19 >>  Azithro 9/19 >> 9/20 Wilfred Lacy 9/19>>  Interim history/subjective:  In bed room air sats 94%   Objective   Blood pressure 106/63, pulse 86, temperature 97.8 F (36.6 C), temperature source Oral, resp. rate 18, height 5\' 10"  (1.778 m), weight 76.4 kg, SpO2 94 %.        Intake/Output Summary (Last 24 hours) at 03/29/2020 0830 Last data filed at 03/29/2020 0417 Gross per 24 hour  Intake 550.09 ml  Output 1000 ml  Net -449.91 ml   Filed Weights   03/24/20 0004 03/27/20 0505 03/28/20 0442  Weight: 76.7 kg 77.7 kg 76.4 kg    Examination: General: Well-nourished well-developed male is currently on room air HEENT: MM pink/moist Neuro: Grossly intact without focal defect  CV: Heart sounds are regular regular rate  rhythm  PULM: Diminished throughout.  Right chest tube with scant amount of drainage with only approximately 180 cc in [place] at this time. V GI: soft, bsx4 active  GU: Voids Extremities: warm/dry,  edema  Skin: no rashes or lesions    Resolved Hospital Problem list     Assessment & Plan:  Empyema due to CAP/aspiration: Strep intermedius on pleural fluid culture from [facility] 9/17. [place] with hyperechoic complicated loculated effusion. Chest tube placed for empyema 9/19  Noted to have 620 cc from chest tube 2 days running although I do not see that in the [place] at this time. Continue ceftriaxone We will get CT of the chest without contrast Consider DC in chest tube depending on CT scan    RML adenocarcinoma s/p resection/chemo 2010 Repeat CT scan  COVID 19  Per primary  Best practice:   Per Primary  Labs   CBC: Recent Labs  Lab 03/25/20 0330 03/26/20 0344 03/27/20 0159 03/28/20 0853 03/29/20 0033  WBC 14.4* 11.2* 8.6 10.3 9.2  NEUTROABS 12.9* 9.4* 6.0 8.0* 6.4  HGB 11.2* 11.0* 11.7* 12.6* 11.3*  HCT 34.3* 33.9* 36.4* 38.4* 33.9*  MCV 88.2 88.3 87.3 87.1 87.1  PLT 245 273 277 336 785    Basic Metabolic Panel: Recent Labs  Lab 03/23/20 0251 03/23/20 0251 03/24/20 0156 03/24/20 0156 03/25/20 0330 03/26/20 0344 03/27/20 0159 03/28/20 0853 03/29/20 0033  NA 135   < > 136   < > 135 136 136 132* 132*  K 3.8   < >  3.6   < > 4.3 4.0 4.2 3.7 3.6  CL 103   < > 103   < > 101 104 102 98 97*  CO2 22   < > 22   < > 25 25 25 27 25   GLUCOSE 138*   < > 134*   < > 156* 130* 94 137* 125*  BUN 17   < > 25*   < > 28* 26* 27* 15 14  CREATININE 0.93   < > 0.79   < > 0.92 0.97 1.02 0.86 0.96  CALCIUM 9.1   < > 9.0   < > 8.9 8.8* 8.4* 8.5* 8.3*  MG 1.7   < > 1.9   < > 1.8 1.9 1.7 1.7 2.5*  PHOS 2.4*  --  2.9  --  3.1 3.5  --   --   --    < > = values in this interval not displayed.   GFR: Estimated Creatinine Clearance: 65.5 mL/min (by C-G formula based on SCr of 0.96  mg/dL). Recent Labs  Lab 03/22/20 1219 03/23/20 0251 03/26/20 0344 03/27/20 0159 03/28/20 0853 03/29/20 0033  PROCALCITON  --    < > 1.18 0.45 0.21 0.17  WBC  --    < > 11.2* 8.6 10.3 9.2  LATICACIDVEN 1.2  --   --   --   --   --    < > = values in this interval not displayed.    Liver Function Tests: Recent Labs  Lab 03/25/20 0330 03/26/20 0344 03/27/20 0159 03/28/20 0853 03/29/20 0033  AST 21 21 19 16 16   ALT 19 25 27 22 21   ALKPHOS 60 54 55 53 52  BILITOT 0.6 0.6 0.7 1.1 1.1  PROT 6.4* 6.3* 6.0* 6.6 6.0*  ALBUMIN 2.4* 2.3* 2.3* 2.5* 2.2*   No results for input(s): LIPASE, AMYLASE in the last 168 hours. No results for input(s): AMMONIA in the last 168 hours.  ABG    Component Value Date/Time   PHART 7.429 03/06/2009 0437   PCO2ART 36.0 03/06/2009 0437   PO2ART 62.0 (L) 03/06/2009 0437   HCO3 23.9 03/06/2009 0437   TCO2 25 03/06/2009 0437   ACIDBASEDEF 0.5 03/01/2009 0922   O2SAT 93.0 03/06/2009 0437     Coagulation Profile: No results for input(s): INR, PROTIME in the last 168 hours.  Cardiac Enzymes: No results for input(s): CKTOTAL, CKMB, CKMBINDEX, TROPONINI in the last 168 hours.  HbA1C: Hgb A1c MFr Bld  Date/Time Value Ref Range Status  03/23/2020 09:03 AM 5.6 4.8 - 5.6 % Final    Comment:    (NOTE) Pre diabetes:          5.7%-6.4%  Diabetes:              >6.4%  Glycemic control for   <7.0% adults with diabetes     CBG: Recent Labs  Lab 03/27/20 2129 03/28/20 0727 03/28/20 1114 03/28/20 1623 03/28/20 1704  GLUCAP 123* 110* 127* 158* 138*     Critical care time: n/a     Richardson Landry Valentina Alcoser ACNP Acute Care Nurse Practitioner [place] Please consult [place] 03/29/2020, 8:30 AM

## 2020-03-29 NOTE — Progress Notes (Signed)
PROGRESS NOTE                                                                                                                                                                                                             Patient Demographics:    Mathew Hall, is a 79 y.o. male, DOB - 01/01/1941, ULA:[id number]  Outpatient Primary MD for the patient is [name], MD    LOS - 7  Admit date - 03/21/2020    Chief Complaint  Patient presents with  . Back Pain       Brief Narrative - Mathew Hall is a 79 y.o. male with medical history significant of lung CA status post partial resection, hypertension, HLD, GERD, CAD with MI remote, presented with worsening back pain and cough.  Patient did eat grass cutting 2 days ago, and he said that usually after doing grass cutting we will have back pain for a day or 2.  ER work-up shows incidental COVID-19 infection along with a partially loculated right-sided pleural effusion and he was admitted to the hospital.   Subjective:   Patient in bed, appears comfortable, denies any headache, no fever, no chest pain or pressure, no shortness of breath , no abdominal pain. No focal weakness.   Assessment  & Plan :   Incidental COVID-19 Infection in a patient is fully vaccinated.   - Although his CRP is elevated he has no hypoxia or oxygen demand at rest, CRP most likely elevated in the setting of empyema . -has finished his short course of steroids and Remdesivir and this problem seems to have resolved. -Patient received monoclonal antibody 9/22 after discussing with PCCM -D-dimers is trending up, it is 6.69 today, CTA chest with no evidence of PE, as well venous Dopplers with no evidence of DVT, continue with current dose Lovenox 0.5 mg/kg every 12 hours. Encouraged the patient to sit up in chair in the daytime use I-S and flutter valve for pulmonary toiletry and then prone in bed when at  night.  Will advance activity and titrate down oxygen as possible.   SpO2: 94 % O2 Flow Rate (L/min): 2 L/min  Recent Labs  Lab 03/22/20 1318 03/22/20 2106 03/25/20 0330 03/26/20 0344 03/27/20 0159 03/28/20 0830 03/28/20 0853 03/29/20 0033  WBC  --    < > 14.4* 11.2* 8.6  --  10.3 9.2  PLT  --    < > 245 273 277  --  336 306  CRP  --    < > 16.9* 10.2* 6.8*  --  13.1* 12.5*  BNP  --    < > 235.3* 240.7* 143.0* 67.4  --  50.5  DDIMER  --    < > 2.09* 2.17* 3.67*  --  5.45* 6.69*  PROCALCITON  --    < > 2.93 1.18 0.45  --  0.21 0.17  AST  --    < > 21 21 19   --  16 16  ALT  --    < > 19 25 27   --  22 21  ALKPHOS  --    < > 60 54 55  --  53 52  BILITOT  --    < > 0.6 0.6 0.7  --  1.1 1.1  ALBUMIN  --    < > 2.4* 2.3* 2.3*  --  2.5* 2.2*  SARSCOV2NAA POSITIVE*  --   --   --   --   --   --   --    < > = values in this interval not displayed.    Empyema due to CAP/aspiration  -Status post thoracentesis 9/17 . - underwent chest tube placement on the right side on 03/25/2020,  - pleural fluid growing Streptococcus intermedius.  On IV Rocephin, currently on cefazolin, antibiotics management per PCCM. -Will require outpatient pulmonary follow-up as well. -Chest tube management per PCCM, plan to repeat CT chest today  History of  Metastatic nonsmall cell lung cancer, adenocarcinoma Right Lung cancer in the past ( 15 yrs ago, Dr Inda Merlin)  With status post neoadjuvant chemotherapy followed by right middle lobectomy with lymph node dissection . -Follow on repeat CT chest today. -Cytology from thoracentesis 9/17 with no malignant cells identified.  Borderline elevated D-dimer.   -Continue with Lovenox 0.5 meg per cake twice daily. -Negative leg ultrasound  Essential hypertension.   - On Norvasc, and a 3-second asymptomatic pause on telemetry on 03/27/2020 early morning, beta-blocker stopped.  As needed hydralazine continue,.  History of CAD.  Chest pain-free no acute issues, on  beta-blocker, aspirin and statin for secondary protection.  Continue.  Dehydration.  Has been adequately hydrated with IV fluids.  Right-sided parapneumonic effusion/empyema question silent aspiration.  Cleared by speech continue with supportive care as in #2 above.  3 GERD asymptomatic sinus pause on telemetry.  Discontinued beta-blocker, check TSH and monitor.     Condition -   Guarded  Family Communication  : Wife Bergeson [phone] left voicemail 9/23.  Code Status :  Full  Consults  :  Pulm  Procedures  :    Right-sided chest tube placement on 03/25/2020 by pulmonary.   Right-sided ultrasound-guided thoracentesis on 03/23/2020.  Exudative fluid.  No malignant cells in cytology  Lower extremity venous duplex.  No DVT.    CT - No evidence of pulmonary embolism. Partially loculated RIGHT pleural effusion with compressive atelectasis of the RIGHT lower lobe. Prior RIGHT middle lobe resection. Single minimally enlarged precarinal lymph node 11 mm short axis. No acute intra-abdominal or intrapelvic abnormalities. Prostatic enlargement. BILATERAL inguinal hernias containing fat larger on RIGHT. Cholelithiasis. Aortic Atherosclerosis (ICD10-I70.0) and Emphysema (ICD10-J43.9).  PUD Prophylaxis : PPI  Disposition Plan  :    Status is: Inpatient  Remains inpatient appropriate because:IV treatments appropriate due to intensity of illness or inability to take PO   Dispo: The patient is  from: Home              Anticipated d/c is to: Home              Anticipated d/c date is: 3 days              Patient currently is not medically stable to d/c.   DVT Prophylaxis  :  Lovenox    Lab Results  Component Value Date   PLT 306 03/29/2020    Diet :  Diet Order            Diet regular Room service appropriate? Yes; Fluid consistency: Thin  Diet effective now                  Inpatient Medications  Scheduled Meds: . amLODipine  5 mg Oral Daily  . aspirin EC  81 mg Oral  Daily  . enoxaparin (LOVENOX) injection  40 mg Subcutaneous Q12H  . insulin aspart  0-5 Units Subcutaneous QHS  . insulin aspart  0-9 Units Subcutaneous TID WC  . ipratropium  2 puff Inhalation Q6H  . pantoprazole  40 mg Oral Daily  . simvastatin  40 mg Oral q1800   Continuous Infusions: . sodium chloride 250 mL (03/29/20 3810)  .  ceFAZolin (ANCEF) IV 2 g (03/29/20 0610)   PRN Meds:.sodium chloride, acetaminophen, albuterol, guaiFENesin-dextromethorphan, hydrALAZINE, [DISCONTINUED] ondansetron **OR** ondansetron (ZOFRAN) IV, oxybutynin  Antibiotics  :    Anti-infectives (From admission, onward)   Start     Dose/Rate Route Frequency Ordered Stop   03/28/20 1400  ceFAZolin (ANCEF) IVPB 2g/100 mL premix        2 g 200 mL/hr over 30 Minutes Intravenous Every 8 hours 03/28/20 1148     03/24/20 1500  cefTRIAXone (ROCEPHIN) 2 g in sodium chloride 0.9 % 100 mL IVPB  Status:  Discontinued        2 g 200 mL/hr over 30 Minutes Intravenous Every 24 hours 03/24/20 1446 03/28/20 1148   03/24/20 1500  azithromycin (ZITHROMAX) 500 mg in sodium chloride 0.9 % 250 mL IVPB  Status:  Discontinued        500 mg 250 mL/hr over 60 Minutes Intravenous Every 24 hours 03/24/20 1446 03/26/20 1141   03/23/20 1000  remdesivir 100 mg in sodium chloride 0.9 % 100 mL IVPB       "Followed by" Linked Group Details   100 mg 200 mL/hr over 30 Minutes Intravenous Daily 03/22/20 1806 03/26/20 2022   03/22/20 1845  remdesivir 200 mg in sodium chloride 0.9% 250 mL IVPB       "Followed by" Linked Group Details   200 mg 580 mL/hr over 30 Minutes Intravenous Once 03/22/20 1806 03/22/20 2025        Treniyah Lynn M.D on 03/29/2020 at 12:22 PM  To page go to [email or web address] -  [facility] -  Office  [phone]     See all Orders from today for further details    Objective:   Vitals:   03/28/20 1354 03/28/20 1500 03/28/20 2012 03/29/20 0414  BP: 112/77  116/63 106/63  Pulse: 91  82 86  Resp: 20   20 18   Temp: 97.6 F (36.4 C)  97.7 F (36.5 C) 97.8 F (36.6 C)  TempSrc: Axillary  Oral Oral  SpO2: 96% 97% 94% 94%  Weight:      Height:        Wt Readings from Last 3 Encounters:  03/28/20 76.4 kg  09/02/19  78.9 kg  08/01/19 79.2 kg     Intake/Output Summary (Last 24 hours) at 03/29/2020 1222 Last data filed at 03/29/2020 0809 Gross per 24 hour  Intake 570.09 ml  Output 1000 ml  Net -429.91 ml     Physical Exam  Awake Alert, Oriented X 3, No new F.N deficits, Normal affect Symmetrical Chest wall movement, Good air movement bilaterally, has right side chest tube RRR,No Gallops,Rubs or new Murmurs, No Parasternal Heave +ve B.Sounds, Abd Soft, No tenderness, No rebound - guarding or rigidity. No Cyanosis, Clubbing or edema, No new Rash or bruise      Data Review:    CBC Recent Labs  Lab 03/25/20 0330 03/26/20 0344 03/27/20 0159 03/28/20 0853 03/29/20 0033  WBC 14.4* 11.2* 8.6 10.3 9.2  HGB 11.2* 11.0* 11.7* 12.6* 11.3*  HCT 34.3* 33.9* 36.4* 38.4* 33.9*  PLT 245 273 277 336 306  MCV 88.2 88.3 87.3 87.1 87.1  MCH 28.8 28.6 28.1 28.6 29.0  MCHC 32.7 32.4 32.1 32.8 33.3  RDW 14.2 14.2 14.1 14.2 14.0  LYMPHSABS 0.7 0.9 1.5 1.1 1.5  MONOABS 0.7 0.9 0.9 0.8 0.8  EOSABS 0.0 0.0 0.0 0.1 0.1  BASOSABS 0.0 0.0 0.0 0.0 0.0    Recent Labs  Lab 03/23/20 0903 03/23/20 0913 03/25/20 0330 03/26/20 0344 03/27/20 0159 03/28/20 0830 03/28/20 0853 03/29/20 0033  NA  --    < > 135 136 136  --  132* 132*  K  --    < > 4.3 4.0 4.2  --  3.7 3.6  CL  --    < > 101 104 102  --  98 97*  CO2  --    < > 25 25 25   --  27 25  GLUCOSE  --    < > 156* 130* 94  --  137* 125*  BUN  --    < > 28* 26* 27*  --  15 14  CREATININE  --    < > 0.92 0.97 1.02  --  0.86 0.96  CALCIUM  --    < > 8.9 8.8* 8.4*  --  8.5* 8.3*  AST  --    < > 21 21 19   --  16 16  ALT  --    < > 19 25 27   --  22 21  ALKPHOS  --    < > 60 54 55  --  53 52  BILITOT  --    < > 0.6 0.6 0.7  --  1.1 1.1    ALBUMIN  --    < > 2.4* 2.3* 2.3*  --  2.5* 2.2*  MG  --    < > 1.8 1.9 1.7  --  1.7 2.5*  CRP  --    < > 16.9* 10.2* 6.8*  --  13.1* 12.5*  DDIMER  --    < > 2.09* 2.17* 3.67*  --  5.45* 6.69*  PROCALCITON  --    < > 2.93 1.18 0.45  --  0.21 0.17  TSH  --   --   --   --  1.228  --   --   --   HGBA1C 5.6  --   --   --   --   --   --   --   BNP  --    < > 235.3* 240.7* 143.0* 67.4  --  50.5   < > = values in this interval not displayed.    ------------------------------------------------------------------------------------------------------------------  No results for input(s): CHOL, HDL, LDLCALC, TRIG, CHOLHDL, LDLDIRECT in the last 72 hours.  Lab Results  Component Value Date   HGBA1C 5.6 03/23/2020   ------------------------------------------------------------------------------------------------------------------ Recent Labs    03/27/20 0159  TSH 1.228    Cardiac Enzymes No results for input(s): CKMB, TROPONINI, MYOGLOBIN in the last 168 hours.  Invalid input(s): CK ------------------------------------------------------------------------------------------------------------------    Component Value Date/Time   BNP 50.5 03/29/2020 0033    Micro Results Recent Results (from the past 240 hour(s))  SARS Coronavirus 2 by RT PCR (hospital order, performed in [facility] hospital lab) Nasopharyngeal Nasopharyngeal Swab     Status: Abnormal   Collection Time: 03/22/20  1:18 PM   Specimen: Nasopharyngeal Swab  Result Value Ref Range Status   SARS Coronavirus 2 POSITIVE (A) NEGATIVE Final    Comment: RESULT CALLED TO, READ BACK BY AND VERIFIED WITHKeturah Barre RN 1528 03/22/20 A BROWNING (NOTE) SARS-CoV-2 target nucleic acids are DETECTED  SARS-CoV-2 RNA is generally detectable in upper respiratory specimens  during the acute phase of infection.  Positive results are indicative  of the presence of the identified virus, but do not rule out bacterial infection or co-infection  with other pathogens not detected by the test.  Clinical correlation with patient history and  other diagnostic information is necessary to determine patient infection status.  The expected result is negative.  Fact Sheet for Patients:   StrictlyIdeas.no   Fact Sheet for Healthcare Providers:   BankingDealers.co.za    This test is not yet approved or cleared by the [place] FDA and  has been authorized for detection and/or diagnosis of SARS-CoV-2 by FDA under an Emergency Use Authorization (EUA).  This EUA will remain in effect (meaning this  test can be used) for the duration of  the COVID-19 declaration under Section 564(b)(1) of the Act, 21 U.S.C. section 360-bbb-3(b)(1), unless the authorization is terminated or revoked sooner.  Performed at [facility], [place] [address], [place], [place] [address]   Body fluid culture     Status: None   Collection Time: 03/23/20  3:08 PM   Specimen: Pleura; Body Fluid  Result Value Ref Range Status   Specimen Description PLEURAL  Final   Special Requests PLEURAL R  Final   Gram Stain   Final    ABUNDANT WBC PRESENT, PREDOMINANTLY PMN NO ORGANISMS SEEN    Culture   Final    RARE STREPTOCOCCUS INTERMEDIUS CRITICAL RESULT CALLED TO, READ BACK BY AND VERIFIED WITH: RN Sherlyn Lick [phone] FCP Performed at [facility], [address] [place], [place] [address]    Report Status 03/27/2020 FINAL  Final   Organism ID, Bacteria STREPTOCOCCUS INTERMEDIUS  Final      Susceptibility   Streptococcus intermedius - MIC*    PENICILLIN <=0.06 SENSITIVE Sensitive     CEFTRIAXONE <=0.12 SENSITIVE Sensitive     ERYTHROMYCIN <=0.12 SENSITIVE Sensitive     LEVOFLOXACIN 0.5 SENSITIVE Sensitive     VANCOMYCIN 0.5 SENSITIVE Sensitive     * RARE STREPTOCOCCUS INTERMEDIUS    Radiology Reports DG Chest 1 View  Result Date: 03/25/2020 CLINICAL DATA:  Chest tube in place EXAM: CHEST  1 VIEW  COMPARISON:  March 23, 2020 FINDINGS: A pigtail right chest tube is been placed. The right-sided pleural effusion is smaller although effusion and underlying opacity does persist. Postoperative changes in the right base. No pneumothorax. The left lung is clear. The cardiomediastinal silhouette is stable. IMPRESSION: 1.  A chest tube is been placed on the right. The right-sided pleural effusion persists but is smaller. Electronically Signed   By: Dorise Bullion III M.D   On: 03/25/2020 19:50   DG Chest 2 View  Result Date: 03/21/2020 CLINICAL DATA:  79 year old male with cough and back pain. Former smoker. Previous right middle lobectomy. EXAM: CHEST - 2 VIEW COMPARISON:  Chest CT 07/29/2019 and earlier. FINDINGS: Right lung volume loss and architectural distortion similar to the January CT. However, with increased streaky and indistinct right lung base opacity since that time. Left lung volume is stable. No superimposed pneumothorax. No definite pleural effusion. No pulmonary edema. Mediastinal contours are stable and within normal limits. Visualized tracheal air column is within normal limits. No acute osseous abnormality identified. Negative visible bowel gas pattern. IMPRESSION: Sequelae of prior right middle lobectomy, but with superimposed new interstitial opacity in the right lower lung. Favor acute viral/atypical respiratory infection. No definite pleural effusion. Recommend Followup PA and lateral chest X-ray, or alternatively repeat restaging Chest CT, following therapy to ensure resolution and exclude recurrent malignancy. Electronically Signed   By: Genevie Ann M.D.   On: 03/21/2020 22:26   CT CHEST WO CONTRAST  Result Date: 03/29/2020 CLINICAL DATA:  Precarinal lymphadenopathy on prior study. History of lung nodule. EXAM: CT CHEST WITHOUT CONTRAST TECHNIQUE: Multidetector CT imaging of the chest was performed following the standard protocol without IV contrast. COMPARISON:  03/22/2020 FINDINGS:  Cardiovascular: The heart size is normal. No substantial pericardial effusion. Coronary artery calcification is evident. Atherosclerotic calcification is noted in the wall of the thoracic aorta. Mediastinum/Nodes: 13 mm short axis precarinal node is unchanged. No other mediastinal lymphadenopathy evident. No evidence for gross hilar lymphadenopathy although assessment is limited by the lack of intravenous contrast on today's study. The esophagus has normal imaging features. There is no axillary lymphadenopathy. Lungs/Pleura: The low-density nodular process along the right major fissure (69/6) is similar to prior study and new since 07/29/2019. Low-density suggests that this may be loculated fluid. Right parahilar staple line again noted. Posterior pleural drain is identified with pigtail loop formed posteromedially. There is residual fluid/debris/gas in the posterior right pleural space. Residual collapse/consolidative opacity noted in the posterior right lower lobe. Subsegmental atelectasis in the left lower lobe is new in the interval. No left effusion. Upper Abdomen: Tiny calcified gallstone evident. Musculoskeletal: No worrisome lytic or sclerotic osseous abnormality. IMPRESSION: 1. The low-density nodular process along the right major fissure is similar to prior study and new since 07/29/2019. Low-density suggests that this is loculated fluid. Continued close attention recommended. 2. Posterior right pleural drain with interval decrease in right pleural fluid and residual fluid/debris/gas in the posterior right pleural space. 3. Residual collapse/consolidative opacity in the posterior right lower lobe. 4. New subsegmental atelectasis in the left lower lobe. 5. Cholelithiasis. 6. Aortic Atherosclerosis (ICD10-I70.0). Electronically Signed   By: Misty Stanley M.D.   On: 03/29/2020 10:05   CT Angio Chest PE W and/or Wo Contrast  Result Date: 03/22/2020 CLINICAL DATA:  Abdominal pain, pleuritic RIGHT chest  pain, history lung cancer post resection, possible RIGHT groin hernia, prior abnormal CT chest EXAM: CT ANGIOGRAPHY CHEST CT ABDOMEN AND PELVIS WITH CONTRAST TECHNIQUE: Multidetector CT imaging of the chest was performed using the standard protocol during bolus administration of intravenous contrast. Multiplanar CT image reconstructions and MIPs were obtained to evaluate the vascular anatomy. Multidetector CT imaging of the abdomen and pelvis was performed using the standard protocol during bolus administration of intravenous  contrast. CONTRAST:  134mL OMNIPAQUE IOHEXOL 350 MG/ML SOLN IV. No oral contrast. COMPARISON:  CT chest 07/29/2019 FINDINGS: CTA CHEST FINDINGS Cardiovascular: Atherosclerotic calcifications of thoracic aorta and coronary arteries. Aorta normal caliber without aneurysm or dissection. Heart unremarkable. No pericardial effusion. Pulmonary arteries adequately opacified. Scattered respiratory motion artifacts at the lower lobes. No definite pulmonary emboli identified. Mediastinum/Nodes: Esophagus unremarkable. Base of cervical region normal appearance. 11 mm precarinal node slightly increased from previous exam. Additional small normal size mediastinal lymph nodes without additional thoracic adenopathy. Lungs/Pleura: Partially loculated RIGHT pleural effusion. Compressive atelectasis of RIGHT lower lobe. Prior RIGHT middle lobe resection. Loculated fluid in RIGHT major fissure posteriorly. Underlying emphysematous changes. No acute infiltrate, pleural effusion or pneumothorax. Small focus of scarring in the RIGHT upper lobe unchanged image 29. Musculoskeletal: No acute osseous findings. Review of the MIP images confirms the above findings. CT ABDOMEN and PELVIS FINDINGS Hepatobiliary: Calcified gallstone dependently in gallbladder measuring 5 mm diameter. Gallbladder and liver otherwise normal appearance. Pancreas: Normal appearance Spleen: Normal appearance Adrenals/Urinary Tract: Adrenal glands  kidneys, ureters, and bladder normal appearance Stomach/Bowel: Short segment of appendiceal base normal appearance. Stomach and bowel loops normal appearance. Vascular/Lymphatic: Atherosclerotic calcifications of aortic and iliac arteries without aneurysm. No adenopathy. Reproductive: Prostatic enlargement gland measuring 6.5 x 6.5 x 5.5 cm (volume = 120 cm^3) Other: BILATERAL inguinal hernias containing fat larger on RIGHT. No free air or free fluid. No inflammatory process. Tiny umbilical hernia containing fat. Musculoskeletal: Bones demineralized. Degenerative disc disease changes thoracolumbar spine. Review of the MIP images confirms the above findings. IMPRESSION: No evidence of pulmonary embolism. Partially loculated RIGHT pleural effusion with compressive atelectasis of the RIGHT lower lobe. Prior RIGHT middle lobe resection. Single minimally enlarged precarinal lymph node 11 mm short axis. No acute intra-abdominal or intrapelvic abnormalities. Prostatic enlargement. BILATERAL inguinal hernias containing fat larger on RIGHT. Cholelithiasis. Aortic Atherosclerosis (ICD10-I70.0) and Emphysema (ICD10-J43.9). Electronically Signed   By: Lavonia Dana M.D.   On: 03/22/2020 15:03   CT ABDOMEN PELVIS W CONTRAST  Result Date: 03/22/2020 CLINICAL DATA:  Abdominal pain, pleuritic RIGHT chest pain, history lung cancer post resection, possible RIGHT groin hernia, prior abnormal CT chest EXAM: CT ANGIOGRAPHY CHEST CT ABDOMEN AND PELVIS WITH CONTRAST TECHNIQUE: Multidetector CT imaging of the chest was performed using the standard protocol during bolus administration of intravenous contrast. Multiplanar CT image reconstructions and MIPs were obtained to evaluate the vascular anatomy. Multidetector CT imaging of the abdomen and pelvis was performed using the standard protocol during bolus administration of intravenous contrast. CONTRAST:  120mL OMNIPAQUE IOHEXOL 350 MG/ML SOLN IV. No oral contrast. COMPARISON:  CT chest  07/29/2019 FINDINGS: CTA CHEST FINDINGS Cardiovascular: Atherosclerotic calcifications of thoracic aorta and coronary arteries. Aorta normal caliber without aneurysm or dissection. Heart unremarkable. No pericardial effusion. Pulmonary arteries adequately opacified. Scattered respiratory motion artifacts at the lower lobes. No definite pulmonary emboli identified. Mediastinum/Nodes: Esophagus unremarkable. Base of cervical region normal appearance. 11 mm precarinal node slightly increased from previous exam. Additional small normal size mediastinal lymph nodes without additional thoracic adenopathy. Lungs/Pleura: Partially loculated RIGHT pleural effusion. Compressive atelectasis of RIGHT lower lobe. Prior RIGHT middle lobe resection. Loculated fluid in RIGHT major fissure posteriorly. Underlying emphysematous changes. No acute infiltrate, pleural effusion or pneumothorax. Small focus of scarring in the RIGHT upper lobe unchanged image 29. Musculoskeletal: No acute osseous findings. Review of the MIP images confirms the above findings. CT ABDOMEN and PELVIS FINDINGS Hepatobiliary: Calcified gallstone dependently in gallbladder measuring 5 mm diameter. Gallbladder  and liver otherwise normal appearance. Pancreas: Normal appearance Spleen: Normal appearance Adrenals/Urinary Tract: Adrenal glands kidneys, ureters, and bladder normal appearance Stomach/Bowel: Short segment of appendiceal base normal appearance. Stomach and bowel loops normal appearance. Vascular/Lymphatic: Atherosclerotic calcifications of aortic and iliac arteries without aneurysm. No adenopathy. Reproductive: Prostatic enlargement gland measuring 6.5 x 6.5 x 5.5 cm (volume = 120 cm^3) Other: BILATERAL inguinal hernias containing fat larger on RIGHT. No free air or free fluid. No inflammatory process. Tiny umbilical hernia containing fat. Musculoskeletal: Bones demineralized. Degenerative disc disease changes thoracolumbar spine. Review of the MIP  images confirms the above findings. IMPRESSION: No evidence of pulmonary embolism. Partially loculated RIGHT pleural effusion with compressive atelectasis of the RIGHT lower lobe. Prior RIGHT middle lobe resection. Single minimally enlarged precarinal lymph node 11 mm short axis. No acute intra-abdominal or intrapelvic abnormalities. Prostatic enlargement. BILATERAL inguinal hernias containing fat larger on RIGHT. Cholelithiasis. Aortic Atherosclerosis (ICD10-I70.0) and Emphysema (ICD10-J43.9). Electronically Signed   By: Lavonia Dana M.D.   On: 03/22/2020 15:03   DG Chest Port 1 View  Result Date: 03/28/2020 CLINICAL DATA:  Empyema EXAM: PORTABLE CHEST 1 VIEW COMPARISON:  03/27/2020 FINDINGS: Right basilar chest tube remains in place. No visible pneumothorax. Postoperative changes on the right. Right basilar atelectasis. Left lung clear. Heart is normal size. IMPRESSION: Postoperative changes on the right with right base atelectasis. No visible pneumothorax. Electronically Signed   By: Rolm Baptise M.D.   On: 03/28/2020 08:41   DG Chest Port 1 View  Result Date: 03/27/2020 CLINICAL DATA:  Empyema.  Chest tube.  COVID. EXAM: PORTABLE CHEST 1 VIEW COMPARISON:  03/26/2020. FINDINGS: Right chest tube in stable position. No pneumothorax. Mediastinum hilar structures normal. Persistent right base atelectasis/infiltrate. Persistent small right pleural effusion. Small amount of fluid may be in the right major fissure. Interim improvement in aeration in the left lung base. Stable deformity of the right posterior seventh rib. Degenerative changes scoliosis thoracic spine. IMPRESSION: 1.  Right chest tube in stable position.  No pneumothorax. 2. Persistent right base atelectasis/infiltrate. Persistent small right pleural effusion. Small amount of fluid may be present in the right major fissure. Similar findings noted on prior exam. Interim improvement in aeration in the left lung base. Electronically Signed   By:  Marcello Moores  Register   On: 03/27/2020 06:27   DG Chest Port 1 View  Result Date: 03/26/2020 CLINICAL DATA:  Reason for exam: chest tube in place EXAM: PORTABLE CHEST - 1 VIEW COMPARISON:  the previous day's study FINDINGS: Stable pigtail chest tube at the right lung base. No pneumothorax. Patchy atelectasis/consolidation at the lung bases right greater than left. Staple lines in the right mid lung. Heart size normal.  Aortic Atherosclerosis (ICD10-170.0). There may be a small loculated right pleural effusion laterally at the base. Previous right thoracotomy. IMPRESSION: Stable right chest tube with no pneumothorax. Electronically Signed   By: Lucrezia Europe M.D.   On: 03/26/2020 07:03   DG CHEST PORT 1 VIEW  Result Date: 03/23/2020 CLINICAL DATA:  Status post thoracentesis. EXAM: PORTABLE CHEST 1 VIEW COMPARISON:  03/21/2020 and chest CT, 03/22/2020. FINDINGS: There is there is opacity at the right lung base at is similar to the previous day's CT scan, consistent with a combination of residual fluid and atelectasis. Postsurgical changes are noted with right mid to lower lung anastomosis staples in the few surgical vascular clips. No pneumothorax. Left lung is hyperexpanded but clear. IMPRESSION: 1. No pneumothorax or evidence of a complication following thoracentesis. 2.  Persistent opacity at the right lung base consistent with a combination of residual pleural fluid and atelectasis. Postsurgical changes in the right lower lung are unchanged from the previous chest radiograph. Electronically Signed   By: Lajean Manes M.D.   On: 03/23/2020 14:54   VAS [place] LOWER EXTREMITY VENOUS (DVT)  Result Date: 03/23/2020  Lower Venous DVTStudy Indications: + Covid, and Swelling.  Risk Factors: None identified. Performing Technologist: Griffin Basil RCT RDMS  Examination Guidelines: A complete evaluation includes B-mode imaging, spectral Doppler, color Doppler, and power Doppler as needed of all accessible portions of each  vessel. Bilateral testing is considered an integral part of a complete examination. Limited examinations for reoccurring indications may be performed as noted. The reflux portion of the exam is performed with the patient in reverse Trendelenburg.  +---------+---------------+---------+-----------+----------+--------------+ RIGHT    CompressibilityPhasicitySpontaneityPropertiesThrombus Aging +---------+---------------+---------+-----------+----------+--------------+ CFV      Full           Yes      Yes                                 +---------+---------------+---------+-----------+----------+--------------+ SFJ      Full                                                        +---------+---------------+---------+-----------+----------+--------------+ FV Prox  Full                                                        +---------+---------------+---------+-----------+----------+--------------+ FV Mid   Full                                                        +---------+---------------+---------+-----------+----------+--------------+ FV DistalFull                                                        +---------+---------------+---------+-----------+----------+--------------+ PFV      Full                                                        +---------+---------------+---------+-----------+----------+--------------+ POP      Full           Yes      Yes                                 +---------+---------------+---------+-----------+----------+--------------+ PTV      Full                                                        +---------+---------------+---------+-----------+----------+--------------+  PERO     Full                                                        +---------+---------------+---------+-----------+----------+--------------+   +---------+---------------+---------+-----------+----------+--------------+ LEFT      CompressibilityPhasicitySpontaneityPropertiesThrombus Aging +---------+---------------+---------+-----------+----------+--------------+ CFV      Full           Yes      Yes                                 +---------+---------------+---------+-----------+----------+--------------+ SFJ      Full                                                        +---------+---------------+---------+-----------+----------+--------------+ FV Prox  Full                                                        +---------+---------------+---------+-----------+----------+--------------+ FV Mid   Full                                                        +---------+---------------+---------+-----------+----------+--------------+ FV DistalFull                                                        +---------+---------------+---------+-----------+----------+--------------+ PFV      Full                                                        +---------+---------------+---------+-----------+----------+--------------+ POP      Full           Yes      Yes                                 +---------+---------------+---------+-----------+----------+--------------+ PTV      Full                                                        +---------+---------------+---------+-----------+----------+--------------+ PERO     Full                                                        +---------+---------------+---------+-----------+----------+--------------+  Summary: RIGHT: - There is no evidence of deep vein thrombosis in the lower extremity.  - No cystic structure found in the popliteal fossa.  LEFT: - There is no evidence of deep vein thrombosis in the lower extremity.  - No cystic structure found in the popliteal fossa.  *See table(s) above for measurements and observations. Electronically signed by Servando Snare MD on 03/23/2020 at 12:08:48 PM.    Final

## 2020-03-30 LAB — CBC WITH DIFFERENTIAL/PLATELET
Abs Immature Granulocytes: 0.17 10*3/uL — ABNORMAL HIGH (ref 0.00–0.07)
Basophils Absolute: 0 10*3/uL (ref 0.0–0.1)
Basophils Relative: 0 %
Eosinophils Absolute: 0 10*3/uL (ref 0.0–0.5)
Eosinophils Relative: 0 %
HCT: 36.9 % — ABNORMAL LOW (ref 39.0–52.0)
Hemoglobin: 12 g/dL — ABNORMAL LOW (ref 13.0–17.0)
Immature Granulocytes: 2 %
Lymphocytes Relative: 11 %
Lymphs Abs: 1 10*3/uL (ref 0.7–4.0)
MCH: 28.4 pg (ref 26.0–34.0)
MCHC: 32.5 g/dL (ref 30.0–36.0)
MCV: 87.2 fL (ref 80.0–100.0)
Monocytes Absolute: 1 10*3/uL (ref 0.1–1.0)
Monocytes Relative: 11 %
Neutro Abs: 7.2 10*3/uL (ref 1.7–7.7)
Neutrophils Relative %: 76 %
Platelets: 388 10*3/uL (ref 150–400)
RBC: 4.23 MIL/uL (ref 4.22–5.81)
RDW: 13.9 % (ref 11.5–15.5)
WBC: 9.4 10*3/uL (ref 4.0–10.5)
nRBC: 0 % (ref 0.0–0.2)

## 2020-03-30 LAB — COMPREHENSIVE METABOLIC PANEL
ALT: 23 U/L (ref 0–44)
AST: 25 U/L (ref 15–41)
Albumin: 2.6 g/dL — ABNORMAL LOW (ref 3.5–5.0)
Alkaline Phosphatase: 59 U/L (ref 38–126)
Anion gap: 10 (ref 5–15)
BUN: 12 mg/dL (ref 8–23)
CO2: 26 mmol/L (ref 22–32)
Calcium: 8.7 mg/dL — ABNORMAL LOW (ref 8.9–10.3)
Chloride: 94 mmol/L — ABNORMAL LOW (ref 98–111)
Creatinine, Ser: 0.94 mg/dL (ref 0.61–1.24)
GFR calc Af Amer: >60 mL/min (ref >60)
GFR calc non Af Amer: >60 mL/min (ref >60)
Glucose, Bld: 107 mg/dL — ABNORMAL HIGH (ref 70–99)
Potassium: 3.9 mmol/L (ref 3.5–5.1)
Sodium: 130 mmol/L — ABNORMAL LOW (ref 135–145)
Total Bilirubin: 0.8 mg/dL (ref 0.3–1.2)
Total Protein: 7.2 g/dL (ref 6.5–8.1)

## 2020-03-30 LAB — GLUCOSE, CAPILLARY
Glucose-Capillary: 112 mg/dL — ABNORMAL HIGH (ref 70–99)
Glucose-Capillary: 104 mg/dL — ABNORMAL HIGH (ref 70–99)
Glucose-Capillary: 112 mg/dL — ABNORMAL HIGH (ref 70–99)
Glucose-Capillary: 156 mg/dL — ABNORMAL HIGH (ref 70–99)

## 2020-03-30 LAB — D-DIMER, QUANTITATIVE: D-Dimer, Quant: 6.47 ug/mL-FEU — ABNORMAL HIGH (ref 0.00–0.50)

## 2020-03-30 LAB — MAGNESIUM: Magnesium: 1.9 mg/dL (ref 1.7–2.4)

## 2020-03-30 LAB — BRAIN NATRIURETIC PEPTIDE: B Natriuretic Peptide: 70.6 pg/mL (ref 0.0–100.0)

## 2020-03-30 LAB — PROCALCITONIN: Procalcitonin: 0.17 ng/mL

## 2020-03-30 LAB — C-REACTIVE PROTEIN: CRP: 12.2 mg/dL — ABNORMAL HIGH (ref <1.0)

## 2020-03-30 MED ORDER — POTASSIUM CHLORIDE CRYS ER 20 MEQ PO TBCR
40.0000 meq | EXTENDED_RELEASE_TABLET | Freq: Once | ORAL | Status: AC
  Administered 2020-03-30: 40 meq via ORAL
  Filled 2020-03-30: qty 2

## 2020-03-30 MED ORDER — CEPHALEXIN 500 MG PO CAPS
500.0000 mg | ORAL_CAPSULE | Freq: Three times a day (TID) | ORAL | Status: DC
  Administered 2020-03-30 – 2020-03-31 (×5): 500 mg via ORAL
  Filled 2020-03-30 (×5): qty 1

## 2020-03-30 MED ORDER — CEPHALEXIN 500 MG PO CAPS: 500.0000 mg | ORAL_CAPSULE | Freq: Three times a day (TID) | ORAL | Status: DC

## 2020-03-30 NOTE — Progress Notes (Addendum)
NAME:  Mathew Hall, MRN:  [id number], DOB:  1941/01/13, LOS: 8 ADMISSION DATE:  03/21/2020, CONSULTATION DATE:  9/17 REFERRING MD:  Candiss Norse, CHIEF COMPLAINT:  Back pain   Brief History   79 y/o M admitted 9/15 with back pain (posterior mid-back), found to be incidentally positive for COVID.  Work up consistent with right sided pleural effusion.   Past Medical History  CAD s/p MI  HTN HLD GERD Asthma  Metastatic Non Small Cell lung cancer, adenocarcinoma.  Diagnosed 11/2008 with RML resection in 02/2009.  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  S/P 3 cycles chemo.   Significant Hospital Events   9/15 Admit   Consults:    Procedures:  9/18 Thoracentesis> 220 cc cloudy fluid removed, right pleural space 9/19 Chest tube placed (right) >>> 9/24 planed  Significant Diagnostic Tests:  CT Angio Chest 9/17 >> loculated pleural effusion in R base, very mild centrilobular emphysema in upper lobes bilaterally, Compressive atelectasis vs consolidation R base, thickening of airways in R base  Micro Data:  COVID 9/16 >> positive  Pleural culture 9/17: strep intermedius  Antimicrobials:  CTX 9/19 >>  Azithro 9/19 >> 9/20 /remdesivir 9/19>> Regeneron monoclonal antibody September 22 2021x1 dose  Interim history/subjective:    03/30/2020-chest tube has drained around 75 cc/day in the last 48 hours.  He is feeling good.  Has been on room air for a few days.  Pulse ox 95% on room air at rest.  He feels ready to go home.  Discussed with pharmacist and cephalexin is a good oral choice according to the pharmacist.  The hematoma around the right chest tube in the back is improving.  CT scan of the chest yesterday with significant improvement in pleural effusion with just residual pocket present.  Cytology is negative for malignant cells.   Objective   Blood pressure 116/60, pulse 89, temperature 97.8 F (36.6 C),  temperature source Oral, resp. rate 18, height 5\' 10"  (1.778 m), weight 76.4 kg, SpO2 94 %.        Intake/Output Summary (Last 24 hours) at 03/30/2020 7893 Last data filed at 03/30/2020 0700 Gross per 24 hour  Intake 600 ml  Output 590 ml  Net 10 ml   Filed Weights   03/24/20 0004 03/27/20 0505 03/28/20 0442  Weight: 76.7 kg 77.7 kg 76.4 kg   General Appearance:  Looks well mildly decondid Head:  Normocephalic, without obvious abnormality, atraumatic Eyes:  PERRL - yes, conjunctiva/corneas - clear     Ears:  Normal external ear canals, both ears Nose:  G tube - no Throat:  ETT TUBE - no , OG tube - no Neck:  Supple,  No enlargement/tenderness/nodules Lungs: Clear to auscultation bilaterally, overall diminished air entry.  Right infrascapular chest tube with soft hematoma present.  This is improving Heart:  S1 and S2 normal, no murmur, CVP - no.  Pressors - no Abdomen:  Soft, no masses, no organomegaly Genitalia / Rectal:  Not done Extremities:  Extremities- intact Skin:  ntact in exposed areas . Sacral area - intact Neurologic:  Sedation - none -> RASS - +1 . Moves all 4s - yes. CAM-ICU - neg . Orientation - x3+    LABS    PULMONARY No results for input(s): PHART, PCO2ART, PO2ART, HCO3, TCO2, O2SAT in the last 168 hours.  Invalid input(s): PCO2, PO2  CBC Recent Labs  Lab 03/27/20 0159 03/28/20 0853 03/29/20 0033  HGB 11.7* 12.6* 11.3*  HCT 36.4* 38.4* 33.9*  WBC 8.6 10.3 9.2  PLT 277 336 306    COAGULATION No results for input(s): INR in the last 168 hours.  CARDIAC  No results for input(s): TROPONINI in the last 168 hours. No results for input(s): PROBNP in the last 168 hours.   CHEMISTRY Recent Labs  Lab 03/24/20 0156 03/24/20 0156 03/25/20 0330 03/25/20 0330 03/26/20 0344 03/26/20 0344 03/27/20 0159 03/27/20 0159 03/28/20 0853 03/29/20 0033  NA 136   < > 135  --  136  --  136  --  132* 132*  K 3.6   < > 4.3   < > 4.0   < > 4.2   < > 3.7 3.6    CL 103   < > 101  --  104  --  102  --  98 97*  CO2 22   < > 25  --  25  --  25  --  27 25  GLUCOSE 134*   < > 156*  --  130*  --  94  --  137* 125*  BUN 25*   < > 28*  --  26*  --  27*  --  15 14  CREATININE 0.79   < > 0.92  --  0.97  --  1.02  --  0.86 0.96  CALCIUM 9.0   < > 8.9  --  8.8*  --  8.4*  --  8.5* 8.3*  MG 1.9   < > 1.8  --  1.9  --  1.7  --  1.7 2.5*  PHOS 2.9  --  3.1  --  3.5  --   --   --   --   --    < > = values in this interval not displayed.   Estimated Creatinine Clearance: 65.5 mL/min (by C-G formula based on SCr of 0.96 mg/dL).   LIVER Recent Labs  Lab 03/25/20 0330 03/26/20 0344 03/27/20 0159 03/28/20 0853 03/29/20 0033  AST 21 21 19 16 16   ALT 19 25 27 22 21   ALKPHOS 60 54 55 53 52  BILITOT 0.6 0.6 0.7 1.1 1.1  PROT 6.4* 6.3* 6.0* 6.6 6.0*  ALBUMIN 2.4* 2.3* 2.3* 2.5* 2.2*     INFECTIOUS Recent Labs  Lab 03/27/20 0159 03/28/20 0853 03/29/20 0033  PROCALCITON 0.45 0.21 0.17     ENDOCRINE CBG (last 3)  Recent Labs    03/29/20 1630 03/29/20 2133 03/30/20 0739  GLUCAP 119* 136* 112*         IMAGING x48h  - image(s) personally visualized  -   highlighted in bold CT CHEST WO CONTRAST  Result Date: 03/29/2020 CLINICAL DATA:  Precarinal lymphadenopathy on prior study. History of lung nodule. EXAM: CT CHEST WITHOUT CONTRAST TECHNIQUE: Multidetector CT imaging of the chest was performed following the standard protocol without IV contrast. COMPARISON:  03/22/2020 FINDINGS: Cardiovascular: The heart size is normal. No substantial pericardial effusion. Coronary artery calcification is evident. Atherosclerotic calcification is noted in the wall of the thoracic aorta. Mediastinum/Nodes: 13 mm short axis precarinal node is unchanged. No other mediastinal lymphadenopathy evident. No evidence for gross hilar lymphadenopathy although assessment is limited by the lack of intravenous contrast on today's study. The esophagus has normal imaging  features. There is no axillary lymphadenopathy. Lungs/Pleura: The low-density nodular process along the right major fissure (69/6) is similar to prior study and new since 07/29/2019. Low-density suggests that this may be loculated fluid. Right parahilar staple line again  noted. Posterior pleural drain is identified with pigtail loop formed posteromedially. There is residual fluid/debris/gas in the posterior right pleural space. Residual collapse/consolidative opacity noted in the posterior right lower lobe. Subsegmental atelectasis in the left lower lobe is new in the interval. No left effusion. Upper Abdomen: Tiny calcified gallstone evident. Musculoskeletal: No worrisome lytic or sclerotic osseous abnormality. IMPRESSION: 1. The low-density nodular process along the right major fissure is similar to prior study and new since 07/29/2019. Low-density suggests that this is loculated fluid. Continued close attention recommended. 2. Posterior right pleural drain with interval decrease in right pleural fluid and residual fluid/debris/gas in the posterior right pleural space. 3. Residual collapse/consolidative opacity in the posterior right lower lobe. 4. New subsegmental atelectasis in the left lower lobe. 5. Cholelithiasis. 6. Aortic Atherosclerosis (ICD10-I70.0). Electronically Signed   By: Misty Stanley M.D.   On: 03/29/2020 10:05      Resolved Hospital Problem list     Assessment & Plan:  Empyema due to CAP/aspiration: Strep intermedius on pleural fluid culture from [facility] 9/17. [place] with hyperechoic complicated loculated effusion. Chest tube placed for empyema 9/19  -March 30, 2020: Significantly improved.  Chest tube drainage is between 50 and 100 cc/day.  This is for the last 48 hours.  He is nontoxic.  CT chest shows significant improvement.  His baseline lung function is that he does a lot of yard work but is otherwise sedentary.  Pleural fluid cytology is negative for malignant  cells  Plan -Change IV antibiotic to oral cephalexin x3 more weeks for a total of 4 weeks.  Ideal duration might be 6 weeks and this can be decided as an outpatient -He will need a follow-up CT scan of the chest in few to several months -Remove chest tube today -> reassess clinically today or tomorrow before discharge according to the hospitalist -Do ambulatory pulse ox on room air before discharge -Follow-up with pulmonary as below  Hypokalemia less than 4 mEq  Plan -Replete potassium   RML adenocarcinoma s/p resection/chemo 2010 Repeat CT scan probably required in the next few to several months -he has an appointment Dr. Julien Nordmann in January 2022 and the follow-up CT scan can be done at this time  COVID 19  Per primary  Best practice:   Per Primary  Followup  Future Appointments  Date Time Provider [place]  04/12/2020 11:00 AM Lauraine Rinne, NP [facility] None  07/30/2020  8:30 AM [facility] None  07/31/2020  1:45 PM Curt Bears, MD [facility] None   Pulmonary critical care will sign off.  Discussed with Dr. Waldron Labs of hospitalist  SIGNATURE    Dr. Brand Males, M.D., F.C.C.P,  Pulmonary and Critical Care Medicine Staff Physician, [place] Director - Interstitial Lung Disease  Program  Pulmonary [place] [facility] [place], [place], [address]  Pager: [phone], If no answer  OR between  19:00-7:00h: page [phone] Telephone (clinical office): [phone] Telephone (research): [phone]  8:33 AM 03/30/2020

## 2020-03-30 NOTE — TOC Progression Note (Signed)
Transition of Care [facility]) - Progression Note    Patient Details  Name: ISMEAL HEIDER MRN: [id number] Date of Birth: [birth year]  Transition of Care [facility]) CM/SW [place], RN Phone Number: 03/30/2020, 4:32 PM  Clinical Narrative:    Nanine Means unable to provide nursing to [facility] area at this time.    Expected Discharge Plan: [place] Barriers to Discharge: Continued Medical Work up  Expected Discharge Plan and Services Expected Discharge Plan: [place] arrangements for the past 2 months: Single Family Home                                       Social Determinants of Health (SDOH) Interventions    Readmission Risk Interventions No flowsheet data found.

## 2020-03-30 NOTE — TOC Benefit Eligibility Note (Signed)
Transition of Care [facility]) Benefit Eligibility Note    Patient Details  Name: Mathew Hall MRN: [id number] Date of Birth: 28-Nov-1940   Medication/Dose: LOVENOX 40 MG  DAILY   CO-PAY $100.00     and   ENOXAPARIN  40 MG DAILY  SYRINGES   CO-PAY-$100.00   TIER- 4 DRUG  Covered?: Yes  Tier:  (TIER- 4 DRUG)  Prescription Coverage [facility]  Spoke with Person/Company/Phone Number:: LEANV  @ [employer] #  [phone]  Co-Pay: $100.00  Prior Approval: No  Deductible: Unmet  Additional Notes: ELIQUIS  5 MG BID  COVER-YES  CO-PAY $47.00 TIER- 3 DRUG P/A-NO    Memory Argue Phone Number: 03/30/2020, 2:31 PM

## 2020-03-30 NOTE — TOC Initial Note (Addendum)
Transition of Care [facility]) - Initial/Assessment Note    Patient Details  Name: Mathew Hall MRN: [id number] Date of Birth: 1940-07-08  [facility]) CM/SW Contact:    Verdell Carmine, RN Phone Number: 03/30/2020, 1:52 PM  Clinical Narrative:                  Eligibility sent for eliquis and lovenox sq.  Ay need one of the other going home for clot prevention. recommendation for Home Health going home.   Attempted to call patient room, will call back later to give medicare choices for home health.  Tubed free eliquis card to unit to give to patient.   Expected Discharge Plan: [place] Barriers to Discharge: Continued Medical Work up   Patient Goals and CMS Choice        Expected Discharge Plan and Services Expected Discharge Plan: [place]       Living arrangements for the past 2 months: Single Family Home                                      Prior Living Arrangements/Services Living arrangements for the past 2 months: Single Family Home Lives with:: Spouse Patient language and need for interpreter reviewed:: Yes        Need for Family Participation in Patient Care: Yes (Comment) Care giver support system in place?: Yes (comment)   Criminal Activity/Legal Involvement Pertinent to Current Situation/Hospitalization: No - Comment as needed  Activities of Daily Living Home Assistive Devices/Equipment: None ADL Screening (condition at time of admission) Patient's cognitive ability adequate to safely complete daily activities?: Yes Is the patient deaf or have difficulty hearing?: No Does the patient have difficulty seeing, even when wearing glasses/contacts?: No Does the patient have difficulty concentrating, remembering, or making decisions?: No Patient able to express need for assistance with ADLs?: Yes Does the patient have difficulty dressing or bathing?: No Independently performs ADLs?: Yes (appropriate for  developmental age) Does the patient have difficulty walking or climbing stairs?: No Weakness of Legs: None Weakness of Arms/Hands: None  Permission Sought/Granted Permission sought to share information with : Case Manager                Emotional Assessment       Orientation: : Oriented to Place, Oriented to Self, Oriented to  Time, Oriented to Situation Alcohol / Substance Use: Not Applicable Psych Involvement: No (comment)  Admission diagnosis:  S/P thoracentesis [Z98.890] Loculated pleural effusion [J90] Non-recurrent bilateral inguinal hernia without obstruction or gangrene [K40.20] COVID-19 [U07.1] Patient Active Problem List   Diagnosis Date Noted  . Chest tube in place   . Empyema ([place])   . Loculated pleural effusion   . COVID-19 03/22/2020  . CAD (coronary artery disease), emergent stent to ostial RCA and mid RCA with an acute inferior MI in 1999, residual left main disease 40% at that time, last week's study 2011 with inferior scar no isc   . Primary cancer of right middle lobe of lung ([place]) 12/05/2008  . C O P D 10/27/2008  . HYPERLIPIDEMIA 10/10/2008  . Essential hypertension 10/10/2008  . Acute myocardial infarction ([place]) 10/10/2008  . PERSONAL HX COLONIC POLYPS 09/05/2008  . 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 HEMORRHOIDS, INTERNAL 09/04/2008  . ESOPHAGEAL STRICTURE 09/04/2008  . GERD 09/04/2008   PCP:  [name], MD [id number] [address], [place] [address] [address] Phone: [phone] Fax: [phone]     Social Determinants of Health (SDOH) Interventions    Readmission Risk Interventions No flowsheet data found.

## 2020-03-30 NOTE — Progress Notes (Signed)
SATURATION QUALIFICATIONS: (This note is used to comply with regulatory documentation for home oxygen)  Patient Saturations on Room Air at Rest = 93%  Patient Saturations on Room Air while Ambulating = 91%  Please briefly explain why patient needs home oxygen: Pt does not meet requirements for home O2.   Pt ambulated w/ PT in hallway, over 400 ft. Tolerated very well, O2 stayed at/above 91% during walk. HR stayed 80-90s during walk as well. Pt tolerating removal of chest tube well.

## 2020-03-30 NOTE — Progress Notes (Addendum)
PROGRESS NOTE                                                                                                                                                                                                             Patient Demographics:    Mathew Hall, is a 79 y.o. male, DOB - 02-01-1941, KPV:[id number]  Outpatient Primary MD for the patient is [name], MD    LOS - 8  Admit date - 03/21/2020    Chief Complaint  Patient presents with  . Back Pain       Brief Narrative - Mathew Hall is a 79 y.o. male with medical history significant of lung CA status post partial resection, hypertension, HLD, GERD, CAD with MI remote, presented with worsening back pain and cough.  Patient did eat grass cutting 2 days ago, and he said that usually after doing grass cutting we will have back pain for a day or 2.  ER work-up shows incidental COVID-19 infection along with a partially loculated right-sided pleural effusion and he was admitted to the hospital.   Subjective:   Patient in bed, appears comfortable, he does report some mild pain at chest tube insertion site with movement, otherwise no chest pain, shortness of breath, fever or chills.   Assessment  & Plan :   Incidental COVID-19 Infection in a patient is fully vaccinated.   - Although his CRP is elevated he has no hypoxia or oxygen demand at rest, CRP most likely elevated in the setting of empyema . -has finished his short course of steroids and Remdesivir and this problem seems to have resolved. -Patient received monoclonal antibody 9/22 after discussing with PCCM -D-dimers remains elevated at 6.47 ,continue with current dose Lovenox 0.5 mg/kg every 12 hours. Encouraged the patient to sit up in chair in the daytime use I-S and flutter valve for pulmonary toiletry and then prone in bed when at night.  Will advance activity and titrate down oxygen as possible.   SpO2:  94 % O2 Flow Rate (L/min): 2 L/min  Recent Labs  Lab 03/25/20 0330 03/25/20 0330 03/26/20 0344 03/27/20 0159 03/28/20 0830 03/28/20 0853 03/29/20 0033 03/30/20 1054  WBC 14.4*   < > 11.2* 8.6  --  10.3 9.2 9.4  PLT 245   < > 273 277  --  336 306  388  CRP 16.9*   < > 10.2* 6.8*  --  13.1* 12.5* 12.2*  BNP 235.3*  --  240.7* 143.0* 67.4  --  50.5  --   DDIMER 2.09*   < > 2.17* 3.67*  --  5.45* 6.69* 6.47*  PROCALCITON 2.93  --  1.18 0.45  --  0.21 0.17  --   AST 21   < > 21 19  --  16 16 25   ALT 19   < > 25 27  --  22 21 23   ALKPHOS 60   < > 54 55  --  53 52 59  BILITOT 0.6   < > 0.6 0.7  --  1.1 1.1 0.8  ALBUMIN 2.4*   < > 2.3* 2.3*  --  2.5* 2.2* 2.6*   < > = values in this interval not displayed.    Empyema due to CAP/aspiration  -Status post thoracentesis 9/17 . - underwent chest tube placement on the right side on 03/25/2020,  - pleural fluid growing Streptococcus intermedius.  On IV Rocephin, currently on cefazolin, antibiotics management per PCCM.  Need total of 4 weeks of antibiotic treatment, can be discharged on cephalexin for 3 more weeks. -Will require outpatient pulmonary follow-up as well. -Chest tube management per PCCM, plan to DC chest tube today given improvement on CT chest.  History of  Metastatic nonsmall cell lung cancer, adenocarcinoma Right Lung cancer in the past ( 15 yrs ago, Dr Inda Merlin)  With status post neoadjuvant chemotherapy followed by right middle lobectomy with lymph node dissection . -Follow on repeat CT chest today. -Cytology from thoracentesis 9/17 with no malignant cells identified.  Borderline elevated D-dimer.   -Continue with Lovenox 0.5 meg per cake twice daily. -Negative leg ultrasound, CTA chest negative for PE  on admission  Essential hypertension.   - On Norvasc, and a 3-second asymptomatic pause on telemetry on 03/27/2020 early morning, beta-blocker stopped.  As needed hydralazine continue,.  History of CAD.  Chest pain-free no  acute issues, on beta-blocker, aspirin and statin for secondary protection.  Continue.  Dehydration.  Has been adequately hydrated with IV fluids.  Right-sided parapneumonic effusion/empyema question silent aspiration.  Cleared by speech continue with supportive care as in #2 above.  3 GERD asymptomatic sinus pause on telemetry.  Discontinued beta-blocker,    Condition -   Guarded  Family Communication  : Wife Minion [phone] left voicemail 9/24.  Code Status :  Full  Consults  :  Pulm  Procedures  :    Right-sided chest tube placement on 03/25/2020 by pulmonary.   Right-sided ultrasound-guided thoracentesis on 03/23/2020.  Exudative fluid.  No malignant cells in cytology  Lower extremity venous duplex.  No DVT.    CT - No evidence of pulmonary embolism. Partially loculated RIGHT pleural effusion with compressive atelectasis of the RIGHT lower lobe. Prior RIGHT middle lobe resection. Single minimally enlarged precarinal lymph node 11 mm short axis. No acute intra-abdominal or intrapelvic abnormalities. Prostatic enlargement. BILATERAL inguinal hernias containing fat larger on RIGHT. Cholelithiasis. Aortic Atherosclerosis (ICD10-I70.0) and Emphysema (ICD10-J43.9).  PUD Prophylaxis : PPI  Disposition Plan  :    Status is: Inpatient  Remains inpatient appropriate because:IV treatments appropriate due to intensity of illness or inability to take PO   Dispo: The patient is from: Home              Anticipated d/c is to: Home  Anticipated d/c date is: 3 days              Patient currently is not medically stable to d/c.   DVT Prophylaxis  :  Lovenox    Lab Results  Component Value Date   PLT 388 03/30/2020    Diet :  Diet Order            Diet regular Room service appropriate? Yes; Fluid consistency: Thin  Diet effective now                  Inpatient Medications  Scheduled Meds: . amLODipine  5 mg Oral Daily  . aspirin EC  81 mg Oral Daily  .  cephALEXin  500 mg Oral TID  . enoxaparin (LOVENOX) injection  40 mg Subcutaneous Q12H  . insulin aspart  0-5 Units Subcutaneous QHS  . insulin aspart  0-9 Units Subcutaneous TID WC  . ipratropium  2 puff Inhalation Q6H  . pantoprazole  40 mg Oral Daily  . simvastatin  40 mg Oral q1800   Continuous Infusions: . sodium chloride 250 mL (03/29/20 0608)   PRN Meds:.sodium chloride, acetaminophen, albuterol, guaiFENesin-dextromethorphan, hydrALAZINE, [DISCONTINUED] ondansetron **OR** ondansetron (ZOFRAN) IV, oxybutynin  Antibiotics  :    Anti-infectives (From admission, onward)   Start     Dose/Rate Route Frequency Ordered Stop   03/30/20 1000  cephALEXin (KEFLEX) capsule 500 mg  Status:  Discontinued        500 mg Oral 3 times daily 03/30/20 0830 03/30/20 0831   03/30/20 1000  cephALEXin (KEFLEX) capsule 500 mg        500 mg Oral 3 times daily 03/30/20 0831 04/20/20 0959   03/28/20 1400  ceFAZolin (ANCEF) IVPB 2g/100 mL premix  Status:  Discontinued        2 g 200 mL/hr over 30 Minutes Intravenous Every 8 hours 03/28/20 1148 03/30/20 0830   03/24/20 1500  cefTRIAXone (ROCEPHIN) 2 g in sodium chloride 0.9 % 100 mL IVPB  Status:  Discontinued        2 g 200 mL/hr over 30 Minutes Intravenous Every 24 hours 03/24/20 1446 03/28/20 1148   03/24/20 1500  azithromycin (ZITHROMAX) 500 mg in sodium chloride 0.9 % 250 mL IVPB  Status:  Discontinued        500 mg 250 mL/hr over 60 Minutes Intravenous Every 24 hours 03/24/20 1446 03/26/20 1141   03/23/20 1000  remdesivir 100 mg in sodium chloride 0.9 % 100 mL IVPB       "Followed by" Linked Group Details   100 mg 200 mL/hr over 30 Minutes Intravenous Daily 03/22/20 1806 03/26/20 2022   03/22/20 1845  remdesivir 200 mg in sodium chloride 0.9% 250 mL IVPB       "Followed by" Linked Group Details   200 mg 580 mL/hr over 30 Minutes Intravenous Once 03/22/20 1806 03/22/20 2025        Kamariah Fruchter M.D on 03/30/2020 at 12:46 PM  To page go to  [email or web address] -  [facility] -  Office  [phone]     See all Orders from today for further details    Objective:   Vitals:   03/29/20 0414 03/29/20 1300 03/29/20 2026 03/30/20 0501  BP: 106/63 115/63 (!) 118/56 116/60  Pulse: 86 94 83 89  Resp: 18 17 18 18   Temp: 97.8 F (36.6 C) 98.4 F (36.9 C) 97.8 F (36.6 C) 97.8 F (36.6 C)  TempSrc: Oral Oral Oral Oral  SpO2:  94% 94% 97% 94%  Weight:      Height:        Wt Readings from Last 3 Encounters:  03/28/20 76.4 kg  09/02/19 78.9 kg  08/01/19 79.2 kg     Intake/Output Summary (Last 24 hours) at 03/30/2020 1246 Last data filed at 03/30/2020 1100 Gross per 24 hour  Intake 600 ml  Output 620 ml  Net -20 ml     Physical Exam  Awake Alert, Oriented X 3, No new F.N deficits, Normal affect Symmetrical Chest wall movement, Good air movement bilaterally, has right sided chest tube. RRR,No Gallops,Rubs or new Murmurs, No Parasternal Heave +ve B.Sounds, Abd Soft, No tenderness, No rebound - guarding or rigidity. No Cyanosis, Clubbing or edema, No new Rash or bruise      Data Review:    CBC Recent Labs  Lab 03/26/20 0344 03/27/20 0159 03/28/20 0853 03/29/20 0033 03/30/20 1054  WBC 11.2* 8.6 10.3 9.2 9.4  HGB 11.0* 11.7* 12.6* 11.3* 12.0*  HCT 33.9* 36.4* 38.4* 33.9* 36.9*  PLT 273 277 336 306 388  MCV 88.3 87.3 87.1 87.1 87.2  MCH 28.6 28.1 28.6 29.0 28.4  MCHC 32.4 32.1 32.8 33.3 32.5  RDW 14.2 14.1 14.2 14.0 13.9  LYMPHSABS 0.9 1.5 1.1 1.5 1.0  MONOABS 0.9 0.9 0.8 0.8 1.0  EOSABS 0.0 0.0 0.1 0.1 0.0  BASOSABS 0.0 0.0 0.0 0.0 0.0    Recent Labs  Lab 03/25/20 0330 03/25/20 0330 03/26/20 0344 03/27/20 0159 03/28/20 0830 03/28/20 0853 03/29/20 0033 03/30/20 1054  NA 135   < > 136 136  --  132* 132* 130*  K 4.3   < > 4.0 4.2  --  3.7 3.6 3.9  CL 101   < > 104 102  --  98 97* 94*  CO2 25   < > 25 25  --  27 25 26   GLUCOSE 156*   < > 130* 94  --  137* 125* 107*  BUN 28*   < > 26* 27*   --  15 14 12   CREATININE 0.92   < > 0.97 1.02  --  0.86 0.96 0.94  CALCIUM 8.9   < > 8.8* 8.4*  --  8.5* 8.3* 8.7*  AST 21   < > 21 19  --  16 16 25   ALT 19   < > 25 27  --  22 21 23   ALKPHOS 60   < > 54 55  --  53 52 59  BILITOT 0.6   < > 0.6 0.7  --  1.1 1.1 0.8  ALBUMIN 2.4*   < > 2.3* 2.3*  --  2.5* 2.2* 2.6*  MG 1.8   < > 1.9 1.7  --  1.7 2.5* 1.9  CRP 16.9*   < > 10.2* 6.8*  --  13.1* 12.5* 12.2*  DDIMER 2.09*   < > 2.17* 3.67*  --  5.45* 6.69* 6.47*  PROCALCITON 2.93  --  1.18 0.45  --  0.21 0.17  --   TSH  --   --   --  1.228  --   --   --   --   BNP 235.3*  --  240.7* 143.0* 67.4  --  50.5  --    < > = values in this interval not displayed.    ------------------------------------------------------------------------------------------------------------------ No results for input(s): CHOL, HDL, LDLCALC, TRIG, CHOLHDL, LDLDIRECT in the last 72 hours.  Lab Results  Component Value Date  HGBA1C 5.6 03/23/2020   ------------------------------------------------------------------------------------------------------------------ No results for input(s): TSH, T4TOTAL, T3FREE, THYROIDAB in the last 72 hours.  Invalid input(s): FREET3  Cardiac Enzymes No results for input(s): CKMB, TROPONINI, MYOGLOBIN in the last 168 hours.  Invalid input(s): CK ------------------------------------------------------------------------------------------------------------------    Component Value Date/Time   BNP 50.5 03/29/2020 0033    Micro Results Recent Results (from the past 240 hour(s))  SARS Coronavirus 2 by RT PCR (hospital order, performed in [facility] hospital lab) Nasopharyngeal Nasopharyngeal Swab     Status: Abnormal   Collection Time: 03/22/20  1:18 PM   Specimen: Nasopharyngeal Swab  Result Value Ref Range Status   SARS Coronavirus 2 POSITIVE (A) NEGATIVE Final    Comment: RESULT CALLED TO, READ BACK BY AND VERIFIED WITHKeturah Barre RN 1528 03/22/20 A  BROWNING (NOTE) SARS-CoV-2 target nucleic acids are DETECTED  SARS-CoV-2 RNA is generally detectable in upper respiratory specimens  during the acute phase of infection.  Positive results are indicative  of the presence of the identified virus, but do not rule out bacterial infection or co-infection with other pathogens not detected by the test.  Clinical correlation with patient history and  other diagnostic information is necessary to determine patient infection status.  The expected result is negative.  Fact Sheet for Patients:   StrictlyIdeas.no   Fact Sheet for Healthcare Providers:   BankingDealers.co.za    This test is not yet approved or cleared by the [place] FDA and  has been authorized for detection and/or diagnosis of SARS-CoV-2 by FDA under an Emergency Use Authorization (EUA).  This EUA will remain in effect (meaning this  test can be used) for the duration of  the COVID-19 declaration under Section 564(b)(1) of the Act, 21 U.S.C. section 360-bbb-3(b)(1), unless the authorization is terminated or revoked sooner.  Performed at [facility], [place] [address] [place], [place] [address]   Body fluid culture     Status: None   Collection Time: 03/23/20  3:08 PM   Specimen: Pleura; Body Fluid  Result Value Ref Range Status   Specimen Description PLEURAL  Final   Special Requests PLEURAL R  Final   Gram Stain   Final    ABUNDANT WBC PRESENT, PREDOMINANTLY PMN NO ORGANISMS SEEN    Culture   Final    RARE STREPTOCOCCUS INTERMEDIUS CRITICAL RESULT CALLED TO, READ BACK BY AND VERIFIED WITH: RN Sherlyn Lick [phone] FCP Performed at [facility], [address] [place], [place] [address]    Report Status 03/27/2020 FINAL  Final   Organism ID, Bacteria STREPTOCOCCUS INTERMEDIUS  Final      Susceptibility   Streptococcus intermedius - MIC*    PENICILLIN <=0.06 SENSITIVE Sensitive     CEFTRIAXONE <=0.12  SENSITIVE Sensitive     ERYTHROMYCIN <=0.12 SENSITIVE Sensitive     LEVOFLOXACIN 0.5 SENSITIVE Sensitive     VANCOMYCIN 0.5 SENSITIVE Sensitive     * RARE STREPTOCOCCUS INTERMEDIUS    Radiology Reports DG Chest 1 View  Result Date: 03/25/2020 CLINICAL DATA:  Chest tube in place EXAM: CHEST  1 VIEW COMPARISON:  March 23, 2020 FINDINGS: A pigtail right chest tube is been placed. The right-sided pleural effusion is smaller although effusion and underlying opacity does persist. Postoperative changes in the right base. No pneumothorax. The left lung is clear. The cardiomediastinal silhouette is stable. IMPRESSION: 1. A chest tube is been placed on the right. The right-sided pleural effusion persists but is smaller. Electronically Signed  By: Dorise Bullion III M.D   On: 03/25/2020 19:50   DG Chest 2 View  Result Date: 03/21/2020 CLINICAL DATA:  79 year old male with cough and back pain. Former smoker. Previous right middle lobectomy. EXAM: CHEST - 2 VIEW COMPARISON:  Chest CT 07/29/2019 and earlier. FINDINGS: Right lung volume loss and architectural distortion similar to the January CT. However, with increased streaky and indistinct right lung base opacity since that time. Left lung volume is stable. No superimposed pneumothorax. No definite pleural effusion. No pulmonary edema. Mediastinal contours are stable and within normal limits. Visualized tracheal air column is within normal limits. No acute osseous abnormality identified. Negative visible bowel gas pattern. IMPRESSION: Sequelae of prior right middle lobectomy, but with superimposed new interstitial opacity in the right lower lung. Favor acute viral/atypical respiratory infection. No definite pleural effusion. Recommend Followup PA and lateral chest X-ray, or alternatively repeat restaging Chest CT, following therapy to ensure resolution and exclude recurrent malignancy. Electronically Signed   By: Genevie Ann M.D.   On: 03/21/2020 22:26   CT  CHEST WO CONTRAST  Result Date: 03/29/2020 CLINICAL DATA:  Precarinal lymphadenopathy on prior study. History of lung nodule. EXAM: CT CHEST WITHOUT CONTRAST TECHNIQUE: Multidetector CT imaging of the chest was performed following the standard protocol without IV contrast. COMPARISON:  03/22/2020 FINDINGS: Cardiovascular: The heart size is normal. No substantial pericardial effusion. Coronary artery calcification is evident. Atherosclerotic calcification is noted in the wall of the thoracic aorta. Mediastinum/Nodes: 13 mm short axis precarinal node is unchanged. No other mediastinal lymphadenopathy evident. No evidence for gross hilar lymphadenopathy although assessment is limited by the lack of intravenous contrast on today's study. The esophagus has normal imaging features. There is no axillary lymphadenopathy. Lungs/Pleura: The low-density nodular process along the right major fissure (69/6) is similar to prior study and new since 07/29/2019. Low-density suggests that this may be loculated fluid. Right parahilar staple line again noted. Posterior pleural drain is identified with pigtail loop formed posteromedially. There is residual fluid/debris/gas in the posterior right pleural space. Residual collapse/consolidative opacity noted in the posterior right lower lobe. Subsegmental atelectasis in the left lower lobe is new in the interval. No left effusion. Upper Abdomen: Tiny calcified gallstone evident. Musculoskeletal: No worrisome lytic or sclerotic osseous abnormality. IMPRESSION: 1. The low-density nodular process along the right major fissure is similar to prior study and new since 07/29/2019. Low-density suggests that this is loculated fluid. Continued close attention recommended. 2. Posterior right pleural drain with interval decrease in right pleural fluid and residual fluid/debris/gas in the posterior right pleural space. 3. Residual collapse/consolidative opacity in the posterior right lower lobe. 4.  New subsegmental atelectasis in the left lower lobe. 5. Cholelithiasis. 6. Aortic Atherosclerosis (ICD10-I70.0). Electronically Signed   By: Misty Stanley M.D.   On: 03/29/2020 10:05   CT Angio Chest PE W and/or Wo Contrast  Result Date: 03/22/2020 CLINICAL DATA:  Abdominal pain, pleuritic RIGHT chest pain, history lung cancer post resection, possible RIGHT groin hernia, prior abnormal CT chest EXAM: CT ANGIOGRAPHY CHEST CT ABDOMEN AND PELVIS WITH CONTRAST TECHNIQUE: Multidetector CT imaging of the chest was performed using the standard protocol during bolus administration of intravenous contrast. Multiplanar CT image reconstructions and MIPs were obtained to evaluate the vascular anatomy. Multidetector CT imaging of the abdomen and pelvis was performed using the standard protocol during bolus administration of intravenous contrast. CONTRAST:  146mL OMNIPAQUE IOHEXOL 350 MG/ML SOLN IV. No oral contrast. COMPARISON:  CT chest 07/29/2019 FINDINGS: CTA CHEST  FINDINGS Cardiovascular: Atherosclerotic calcifications of thoracic aorta and coronary arteries. Aorta normal caliber without aneurysm or dissection. Heart unremarkable. No pericardial effusion. Pulmonary arteries adequately opacified. Scattered respiratory motion artifacts at the lower lobes. No definite pulmonary emboli identified. Mediastinum/Nodes: Esophagus unremarkable. Base of cervical region normal appearance. 11 mm precarinal node slightly increased from previous exam. Additional small normal size mediastinal lymph nodes without additional thoracic adenopathy. Lungs/Pleura: Partially loculated RIGHT pleural effusion. Compressive atelectasis of RIGHT lower lobe. Prior RIGHT middle lobe resection. Loculated fluid in RIGHT major fissure posteriorly. Underlying emphysematous changes. No acute infiltrate, pleural effusion or pneumothorax. Small focus of scarring in the RIGHT upper lobe unchanged image 29. Musculoskeletal: No acute osseous findings. Review  of the MIP images confirms the above findings. CT ABDOMEN and PELVIS FINDINGS Hepatobiliary: Calcified gallstone dependently in gallbladder measuring 5 mm diameter. Gallbladder and liver otherwise normal appearance. Pancreas: Normal appearance Spleen: Normal appearance Adrenals/Urinary Tract: Adrenal glands kidneys, ureters, and bladder normal appearance Stomach/Bowel: Short segment of appendiceal base normal appearance. Stomach and bowel loops normal appearance. Vascular/Lymphatic: Atherosclerotic calcifications of aortic and iliac arteries without aneurysm. No adenopathy. Reproductive: Prostatic enlargement gland measuring 6.5 x 6.5 x 5.5 cm (volume = 120 cm^3) Other: BILATERAL inguinal hernias containing fat larger on RIGHT. No free air or free fluid. No inflammatory process. Tiny umbilical hernia containing fat. Musculoskeletal: Bones demineralized. Degenerative disc disease changes thoracolumbar spine. Review of the MIP images confirms the above findings. IMPRESSION: No evidence of pulmonary embolism. Partially loculated RIGHT pleural effusion with compressive atelectasis of the RIGHT lower lobe. Prior RIGHT middle lobe resection. Single minimally enlarged precarinal lymph node 11 mm short axis. No acute intra-abdominal or intrapelvic abnormalities. Prostatic enlargement. BILATERAL inguinal hernias containing fat larger on RIGHT. Cholelithiasis. Aortic Atherosclerosis (ICD10-I70.0) and Emphysema (ICD10-J43.9). Electronically Signed   By: Lavonia Dana M.D.   On: 03/22/2020 15:03   CT ABDOMEN PELVIS W CONTRAST  Result Date: 03/22/2020 CLINICAL DATA:  Abdominal pain, pleuritic RIGHT chest pain, history lung cancer post resection, possible RIGHT groin hernia, prior abnormal CT chest EXAM: CT ANGIOGRAPHY CHEST CT ABDOMEN AND PELVIS WITH CONTRAST TECHNIQUE: Multidetector CT imaging of the chest was performed using the standard protocol during bolus administration of intravenous contrast. Multiplanar CT image  reconstructions and MIPs were obtained to evaluate the vascular anatomy. Multidetector CT imaging of the abdomen and pelvis was performed using the standard protocol during bolus administration of intravenous contrast. CONTRAST:  181mL OMNIPAQUE IOHEXOL 350 MG/ML SOLN IV. No oral contrast. COMPARISON:  CT chest 07/29/2019 FINDINGS: CTA CHEST FINDINGS Cardiovascular: Atherosclerotic calcifications of thoracic aorta and coronary arteries. Aorta normal caliber without aneurysm or dissection. Heart unremarkable. No pericardial effusion. Pulmonary arteries adequately opacified. Scattered respiratory motion artifacts at the lower lobes. No definite pulmonary emboli identified. Mediastinum/Nodes: Esophagus unremarkable. Base of cervical region normal appearance. 11 mm precarinal node slightly increased from previous exam. Additional small normal size mediastinal lymph nodes without additional thoracic adenopathy. Lungs/Pleura: Partially loculated RIGHT pleural effusion. Compressive atelectasis of RIGHT lower lobe. Prior RIGHT middle lobe resection. Loculated fluid in RIGHT major fissure posteriorly. Underlying emphysematous changes. No acute infiltrate, pleural effusion or pneumothorax. Small focus of scarring in the RIGHT upper lobe unchanged image 29. Musculoskeletal: No acute osseous findings. Review of the MIP images confirms the above findings. CT ABDOMEN and PELVIS FINDINGS Hepatobiliary: Calcified gallstone dependently in gallbladder measuring 5 mm diameter. Gallbladder and liver otherwise normal appearance. Pancreas: Normal appearance Spleen: Normal appearance Adrenals/Urinary Tract: Adrenal glands kidneys, ureters, and bladder normal appearance  Stomach/Bowel: Short segment of appendiceal base normal appearance. Stomach and bowel loops normal appearance. Vascular/Lymphatic: Atherosclerotic calcifications of aortic and iliac arteries without aneurysm. No adenopathy. Reproductive: Prostatic enlargement gland  measuring 6.5 x 6.5 x 5.5 cm (volume = 120 cm^3) Other: BILATERAL inguinal hernias containing fat larger on RIGHT. No free air or free fluid. No inflammatory process. Tiny umbilical hernia containing fat. Musculoskeletal: Bones demineralized. Degenerative disc disease changes thoracolumbar spine. Review of the MIP images confirms the above findings. IMPRESSION: No evidence of pulmonary embolism. Partially loculated RIGHT pleural effusion with compressive atelectasis of the RIGHT lower lobe. Prior RIGHT middle lobe resection. Single minimally enlarged precarinal lymph node 11 mm short axis. No acute intra-abdominal or intrapelvic abnormalities. Prostatic enlargement. BILATERAL inguinal hernias containing fat larger on RIGHT. Cholelithiasis. Aortic Atherosclerosis (ICD10-I70.0) and Emphysema (ICD10-J43.9). Electronically Signed   By: Lavonia Dana M.D.   On: 03/22/2020 15:03   DG Chest Port 1 View  Result Date: 03/28/2020 CLINICAL DATA:  Empyema EXAM: PORTABLE CHEST 1 VIEW COMPARISON:  03/27/2020 FINDINGS: Right basilar chest tube remains in place. No visible pneumothorax. Postoperative changes on the right. Right basilar atelectasis. Left lung clear. Heart is normal size. IMPRESSION: Postoperative changes on the right with right base atelectasis. No visible pneumothorax. Electronically Signed   By: Rolm Baptise M.D.   On: 03/28/2020 08:41   DG Chest Port 1 View  Result Date: 03/27/2020 CLINICAL DATA:  Empyema.  Chest tube.  COVID. EXAM: PORTABLE CHEST 1 VIEW COMPARISON:  03/26/2020. FINDINGS: Right chest tube in stable position. No pneumothorax. Mediastinum hilar structures normal. Persistent right base atelectasis/infiltrate. Persistent small right pleural effusion. Small amount of fluid may be in the right major fissure. Interim improvement in aeration in the left lung base. Stable deformity of the right posterior seventh rib. Degenerative changes scoliosis thoracic spine. IMPRESSION: 1.  Right chest tube in  stable position.  No pneumothorax. 2. Persistent right base atelectasis/infiltrate. Persistent small right pleural effusion. Small amount of fluid may be present in the right major fissure. Similar findings noted on prior exam. Interim improvement in aeration in the left lung base. Electronically Signed   By: Marcello Moores  Register   On: 03/27/2020 06:27   DG Chest Port 1 View  Result Date: 03/26/2020 CLINICAL DATA:  Reason for exam: chest tube in place EXAM: PORTABLE CHEST - 1 VIEW COMPARISON:  the previous day's study FINDINGS: Stable pigtail chest tube at the right lung base. No pneumothorax. Patchy atelectasis/consolidation at the lung bases right greater than left. Staple lines in the right mid lung. Heart size normal.  Aortic Atherosclerosis (ICD10-170.0). There may be a small loculated right pleural effusion laterally at the base. Previous right thoracotomy. IMPRESSION: Stable right chest tube with no pneumothorax. Electronically Signed   By: Lucrezia Europe M.D.   On: 03/26/2020 07:03   DG CHEST PORT 1 VIEW  Result Date: 03/23/2020 CLINICAL DATA:  Status post thoracentesis. EXAM: PORTABLE CHEST 1 VIEW COMPARISON:  03/21/2020 and chest CT, 03/22/2020. FINDINGS: There is there is opacity at the right lung base at is similar to the previous day's CT scan, consistent with a combination of residual fluid and atelectasis. Postsurgical changes are noted with right mid to lower lung anastomosis staples in the few surgical vascular clips. No pneumothorax. Left lung is hyperexpanded but clear. IMPRESSION: 1. No pneumothorax or evidence of a complication following thoracentesis. 2. Persistent opacity at the right lung base consistent with a combination of residual pleural fluid and atelectasis. Postsurgical changes in the  right lower lung are unchanged from the previous chest radiograph. Electronically Signed   By: Lajean Manes M.D.   On: 03/23/2020 14:54   VAS [place] LOWER EXTREMITY VENOUS (DVT)  Result Date:  03/23/2020  Lower Venous DVTStudy Indications: + Covid, and Swelling.  Risk Factors: None identified. Performing Technologist: Griffin Basil RCT RDMS  Examination Guidelines: A complete evaluation includes B-mode imaging, spectral Doppler, color Doppler, and power Doppler as needed of all accessible portions of each vessel. Bilateral testing is considered an integral part of a complete examination. Limited examinations for reoccurring indications may be performed as noted. The reflux portion of the exam is performed with the patient in reverse Trendelenburg.  +---------+---------------+---------+-----------+----------+--------------+ RIGHT    CompressibilityPhasicitySpontaneityPropertiesThrombus Aging +---------+---------------+---------+-----------+----------+--------------+ CFV      Full           Yes      Yes                                 +---------+---------------+---------+-----------+----------+--------------+ SFJ      Full                                                        +---------+---------------+---------+-----------+----------+--------------+ FV Prox  Full                                                        +---------+---------------+---------+-----------+----------+--------------+ FV Mid   Full                                                        +---------+---------------+---------+-----------+----------+--------------+ FV DistalFull                                                        +---------+---------------+---------+-----------+----------+--------------+ PFV      Full                                                        +---------+---------------+---------+-----------+----------+--------------+ POP      Full           Yes      Yes                                 +---------+---------------+---------+-----------+----------+--------------+ PTV      Full                                                         +---------+---------------+---------+-----------+----------+--------------+  PERO     Full                                                        +---------+---------------+---------+-----------+----------+--------------+   +---------+---------------+---------+-----------+----------+--------------+ LEFT     CompressibilityPhasicitySpontaneityPropertiesThrombus Aging +---------+---------------+---------+-----------+----------+--------------+ CFV      Full           Yes      Yes                                 +---------+---------------+---------+-----------+----------+--------------+ SFJ      Full                                                        +---------+---------------+---------+-----------+----------+--------------+ FV Prox  Full                                                        +---------+---------------+---------+-----------+----------+--------------+ FV Mid   Full                                                        +---------+---------------+---------+-----------+----------+--------------+ FV DistalFull                                                        +---------+---------------+---------+-----------+----------+--------------+ PFV      Full                                                        +---------+---------------+---------+-----------+----------+--------------+ POP      Full           Yes      Yes                                 +---------+---------------+---------+-----------+----------+--------------+ PTV      Full                                                        +---------+---------------+---------+-----------+----------+--------------+ PERO     Full                                                        +---------+---------------+---------+-----------+----------+--------------+  Summary: RIGHT: - There is no evidence of deep vein thrombosis in the lower extremity.  - No cystic structure found in  the popliteal fossa.  LEFT: - There is no evidence of deep vein thrombosis in the lower extremity.  - No cystic structure found in the popliteal fossa.  *See table(s) above for measurements and observations. Electronically signed by Servando Snare MD on 03/23/2020 at 12:08:48 PM.    Final

## 2020-03-30 NOTE — Progress Notes (Signed)
OT Evaluation  Pt close to baseline regarding ADL tasks. Educated on energy conservation and strategies to reduce risk of falls. PT verbalized understanding. No further OT needs. Eager to go home.    03/30/20 1600  OT Visit Information  Last OT Received On 03/30/20  Assistance Needed +1  History of Present Illness Pt is 79 y.o. male with medical history significant of lung CA status post partial resection, hypertension, HLD, GERD, CAD with MI remote, presented with worsening back pain and cough. ER work-up shows incidental COVID-19 infection along with a partially loculated right-sided pleural effusion and he was admitted to the hospital. 9/17 thoracentesis for 220 cc fluid. Chest tube 03/25/20 and removed on 03/30/20  Precautions  Precautions None  Home Living  Family/patient expects to be discharged to: Private residence  Living Arrangements Spouse/significant other  Available Help at Discharge Family  Type of [place] to enter  Entrance Stairs-Number of Steps 4  Entrance Stairs-Rails Right;Left;Can reach both  [place] One level  Bathroom Shower/Tub Walk-in shower  Bathroom Toilet Handicapped height  Bathroom Accessibility Yes  How Accessible Accessible via [place];Cane - single point;Walker - 4 wheels;Shower seat - built in  Prior Function  Level of Independence Independent  Communication  Communication HOH  Pain Assessment  Pain Assessment No/denies pain  Cognition  Arousal/Alertness Awake/alert  Behavior During Therapy WFL for tasks assessed/performed  Overall Cognitive Status Within Functional Limits for tasks assessed  Upper Extremity Assessment  Upper Extremity Assessment Overall WFL for tasks assessed  Lower Extremity Assessment  Lower Extremity Assessment Defer to PT evaluation  ADL  General ADL Comments at baseline; gets more fatigued than usual. Educated on energy conservation and strategies to reduce  risk of falls.   Transfers  Overall transfer level Independent  Balance  Overall balance assessment Mild deficits observed, not formally tested  OT - End of Session  Activity Tolerance Patient tolerated treatment well  Patient left in chair;with call bell/phone within reach  Nurse Communication Mobility status  OT Assessment  OT Recommendation/Assessment Patient does not need any further OT services  OT Visit Diagnosis Muscle weakness (generalized) (M62.81)  OT Problem List Decreased activity tolerance  AM-PAC OT "6 Clicks" Daily Activity Outcome Measure (Version 2)  Help from another person eating meals? 4  Help from another person taking care of personal grooming? 4  Help from another person toileting, which includes using toliet, bedpan, or urinal? 4  Help from another person bathing (including washing, rinsing, drying)? 4  Help from another person to put on and taking off regular upper body clothing? 4  Help from another person to put on and taking off regular lower body clothing? 4  6 Click Score 24  OT Recommendation  Follow Up Recommendations No OT follow up;Supervision - Intermittent  OT Equipment None recommended by OT  Acute Rehab OT Goals  Patient Stated Goal go home tomorrow  OT Goal Formulation All assessment and education complete, DC therapy  OT Time Calculation  OT Start Time (ACUTE ONLY) 1645  OT Stop Time (ACUTE ONLY) 1700  OT Time Calculation (min) 15 min  OT General Charges  $OT Visit 1 Visit  OT Evaluation  $OT Eval Low Complexity 1 Low  Written Expression  Dominant Hand Right  Maurie Boettcher, OT/L   Acute OT Clinical Specialist Acute Rehabilitation Services Pager [phone] Office [phone]

## 2020-03-30 NOTE — Progress Notes (Signed)
Physical Therapy Re-eval Patient Details Name: Mathew Hall MRN: [id number] DOB: 12-11-1940 Today's Date: 03/30/2020    History of Present Illness Pt is 79 y.o. male with medical history significant of lung CA status post partial resection, hypertension, HLD, GERD, CAD with MI remote, presented with worsening back pain and cough. ER work-up shows incidental COVID-19 infection along with a partially loculated right-sided pleural effusion and he was admitted to the hospital. 9/17 thoracentesis for 220 cc fluid. Chest tube 03/25/20 and removed on 03/30/20    PT Comments    Pt was asked to reevaluate pt.  Since evaluation ~a week ago, pt had chest tube placed that has now been removed.  He is on RA and was able to ambulate 400' with stable sats and while talking.  Pt with slight antalgic gait due to chronic knee issues, but was stable and performed all transfers safely and independently. Pt is safe to return home from PT perspective.  No further PT indicated.     Follow Up Recommendations  No PT follow up     Equipment Recommendations  None recommended by PT    Recommendations for Other Services       Precautions / Restrictions Precautions Precautions: None    Mobility  Bed Mobility Overal bed mobility: Independent                Transfers Overall transfer level: Independent               General transfer comment: Pt transferring in room independently; demonstrated safely with therapy  Ambulation/Gait Ambulation/Gait assistance: Supervision Gait Distance (Feet): 400 Feet Assistive device: None Gait Pattern/deviations: Wide base of support;Antalgic;Trunk flexed     General Gait Details: pt reports his baseline due to chronic knee issues; able to carry on conversation on RA and ambulate with sats 92% or >   Stairs             Wheelchair Mobility    Modified Rankin (Stroke Patients Only)       Balance Overall balance assessment: No apparent balance  deficits (not formally assessed)                                          Cognition Arousal/Alertness: Awake/alert Behavior During Therapy: WFL for tasks assessed/performed Overall Cognitive Status: Within Functional Limits for tasks assessed                                        Exercises      General Comments        Pertinent Vitals/Pain Pain Assessment: No/denies pain    Home Living                      Prior Function            PT Goals (current goals can now be found in the care plan section) Acute Rehab PT Goals Patient Stated Goal: go home tomorrow PT Goal Formulation: All assessment and education complete, DC therapy    Frequency           PT Plan Other (comment) (No PT indicated)    Co-evaluation              AM-PAC PT "6 Clicks" Mobility   Outcome Measure  Help needed turning from your back to your side while in a flat bed without using bedrails?: None Help needed moving from lying on your back to sitting on the side of a flat bed without using bedrails?: None Help needed moving to and from a bed to a chair (including a wheelchair)?: None Help needed standing up from a chair using your arms (e.g., wheelchair or bedside chair)?: None Help needed to walk in hospital room?: None Help needed climbing 3-5 steps with a railing? : None 6 Click Score: 24    End of Session   Activity Tolerance: Patient tolerated treatment well Patient left: in chair;with call bell/phone within reach Nurse Communication: Mobility status       Time: 1420-1440 PT Time Calculation (min) (ACUTE ONLY): 20 min  Charges:                        Abran Richard, PT Acute Rehab Services Pager [phone] [facility] [place] 03/30/2020, 2:47 PM

## 2020-03-30 NOTE — TOC Progression Note (Signed)
Transition of Care [facility]) - Progression Note    Patient Details  Name: Mathew Hall MRN: [id number] Date of Birth: Dec 15, 1940  Transition of Care [facility]) CM/SW [place], RN Phone Number: 03/30/2020, 4:09 PM  Clinical Narrative:    Reached out to patient via phone several times. Spoke to MD regarding patient and needs. HH ordered.  Unable to speak to patient regarding [place] .  Did reach out to [place] as a provider for [facility]. Waiting to hear back . Will continue to attempt to make contact with patient o get his choice for [facility] providers.    Expected Discharge Plan: [place] Barriers to Discharge: Continued Medical Work up  Expected Discharge Plan and Services Expected Discharge Plan: [place] arrangements for the past 2 months: Single Family Home                                       Social Determinants of Health (SDOH) Interventions    Readmission Risk Interventions No flowsheet data found.

## 2020-03-31 ENCOUNTER — Inpatient Hospital Stay (HOSPITAL_COMMUNITY): Payer: Medicare Other

## 2020-03-31 DIAGNOSIS — R7989 Other specified abnormal findings of blood chemistry: Secondary | ICD-10-CM

## 2020-03-31 DIAGNOSIS — U071 COVID-19: Secondary | ICD-10-CM

## 2020-03-31 LAB — COMPREHENSIVE METABOLIC PANEL
ALT: 25 U/L (ref 0–44)
AST: 27 U/L (ref 15–41)
Albumin: 2.5 g/dL — ABNORMAL LOW (ref 3.5–5.0)
Alkaline Phosphatase: 62 U/L (ref 38–126)
Anion gap: 7 (ref 5–15)
BUN: 13 mg/dL (ref 8–23)
CO2: 26 mmol/L (ref 22–32)
Calcium: 8.7 mg/dL — ABNORMAL LOW (ref 8.9–10.3)
Chloride: 98 mmol/L (ref 98–111)
Creatinine, Ser: 0.94 mg/dL (ref 0.61–1.24)
GFR calc Af Amer: >60 mL/min (ref >60)
GFR calc non Af Amer: >60 mL/min (ref >60)
Glucose, Bld: 110 mg/dL — ABNORMAL HIGH (ref 70–99)
Potassium: 4.2 mmol/L (ref 3.5–5.1)
Sodium: 131 mmol/L — ABNORMAL LOW (ref 135–145)
Total Bilirubin: 0.9 mg/dL (ref 0.3–1.2)
Total Protein: 6.9 g/dL (ref 6.5–8.1)

## 2020-03-31 LAB — GLUCOSE, CAPILLARY
Glucose-Capillary: 97 mg/dL (ref 70–99)
Glucose-Capillary: 98 mg/dL (ref 70–99)
Glucose-Capillary: 106 mg/dL — ABNORMAL HIGH (ref 70–99)

## 2020-03-31 LAB — MAGNESIUM: Magnesium: 1.7 mg/dL (ref 1.7–2.4)

## 2020-03-31 LAB — CBC WITH DIFFERENTIAL/PLATELET
Abs Immature Granulocytes: 0.15 10*3/uL — ABNORMAL HIGH (ref 0.00–0.07)
Basophils Absolute: 0 10*3/uL (ref 0.0–0.1)
Basophils Relative: 0 %
Eosinophils Absolute: 0.1 10*3/uL (ref 0.0–0.5)
Eosinophils Relative: 1 %
HCT: 34.6 % — ABNORMAL LOW (ref 39.0–52.0)
Hemoglobin: 11.1 g/dL — ABNORMAL LOW (ref 13.0–17.0)
Immature Granulocytes: 2 %
Lymphocytes Relative: 14 %
Lymphs Abs: 1.2 10*3/uL (ref 0.7–4.0)
MCH: 27.9 pg (ref 26.0–34.0)
MCHC: 32.1 g/dL (ref 30.0–36.0)
MCV: 86.9 fL (ref 80.0–100.0)
Monocytes Absolute: 1.1 10*3/uL — ABNORMAL HIGH (ref 0.1–1.0)
Monocytes Relative: 13 %
Neutro Abs: 6.1 10*3/uL (ref 1.7–7.7)
Neutrophils Relative %: 70 %
Platelets: 356 10*3/uL (ref 150–400)
RBC: 3.98 MIL/uL — ABNORMAL LOW (ref 4.22–5.81)
RDW: 13.8 % (ref 11.5–15.5)
WBC: 8.7 10*3/uL (ref 4.0–10.5)
nRBC: 0 % (ref 0.0–0.2)

## 2020-03-31 LAB — D-DIMER, QUANTITATIVE: D-Dimer, Quant: 8.36 ug/mL-FEU — ABNORMAL HIGH (ref 0.00–0.50)

## 2020-03-31 LAB — C-REACTIVE PROTEIN: CRP: 9.8 mg/dL — ABNORMAL HIGH (ref <1.0)

## 2020-03-31 MED ORDER — ASPIRIN 325 MG PO TBEC: 325.0000 mg | DELAYED_RELEASE_TABLET | Freq: Every day | ORAL | Status: AC

## 2020-03-31 MED ORDER — CEPHALEXIN 500 MG PO CAPS: 500.0000 mg | ORAL_CAPSULE | Freq: Three times a day (TID) | ORAL | 0 refills | Status: AC

## 2020-03-31 MED ORDER — SACCHAROMYCES BOULARDII 250 MG PO CAPS: 250.0000 mg | ORAL_CAPSULE | Freq: Two times a day (BID) | ORAL | 0 refills | Status: DC

## 2020-03-31 MED ORDER — PANTOPRAZOLE SODIUM 40 MG PO TBEC
40.0000 mg | DELAYED_RELEASE_TABLET | Freq: Every day | ORAL | 1 refills | Status: DC
Start: 2020-04-01 — End: 2020-05-07

## 2020-03-31 MED ORDER — APIXABAN 2.5 MG PO TABS: 2.5000 mg | ORAL_TABLET | Freq: Two times a day (BID) | ORAL | 0 refills | Status: DC

## 2020-03-31 NOTE — Progress Notes (Signed)
Chest tube removal r. Back:  Bandage dry and intact, no s/s of crepitus.  Pt. States a bit tender and denies pain.

## 2020-03-31 NOTE — Discharge Summary (Signed)
Mathew Hall, is a 79 y.o. male  DOB 1940/08/21  MRN [id number].  Admission date:  03/21/2020  Admitting Physician  Lequita Halt, MD  Discharge Date:  03/31/2020   Primary MD  [name], MD  Recommendations for primary care physician for things to follow:  -Please check CBC, CMP during next visit. -Patient to follow with pulmonary as an outpatient, please see appointment below -Patient on Eliquis 2.5 mg p.o. twice daily DVT prophylaxis dose for elevated D-dimers of 8 for next 15 days, meanwhile to hold aspirin, aspirin can be resumed after finishing 15 days of Eliquis. -Metoprolol has been stopped given for second pause  Admission Diagnosis  S/P thoracentesis [Z98.890] Loculated pleural effusion [J90] Non-recurrent bilateral inguinal hernia without obstruction or gangrene [K40.20] COVID-19 [U07.1]   Discharge Diagnosis  S/P thoracentesis [Z98.890] Loculated pleural effusion [J90] Non-recurrent bilateral inguinal hernia without obstruction or gangrene [K40.20] COVID-19 [U07.1]    Active Problems:   COVID-19   Empyema ([place])   Chest tube in place      Past Medical History:  Diagnosis Date  . Asthma   . CAD (coronary artery disease) 1999   stents to RCA, residual 40% Lt main, decreased EF40%   . Cataract    bilateral-beginning stages  . Colon polyps   . GERD (gastroesophageal reflux disease)   . H/O cardiovascular stress test 1999/2005/2008/2011   last with inf scar/infarct, EF 54%  . H/O echocardiogram 2008   EF 50-55%, Mild MR, Mild TR,    . Hemorrhoids   . 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 Hyperlipemia   . Hypertension   . lung ca dx'd 10/2008   chemo comp 01/2009-stage 4 lung cancer  . Myocardial infarction ([place]) 02-18-1998  . Peptic stricture of esophagus   . Right carotid bruit 04/01/12   minimal carotid stenosis on doppler    Past Surgical  History:  Procedure Laterality Date  . ANGIOPLASTY     with 2 stent placement  . COLONOSCOPY    . CORONARY ANGIOPLASTY WITH STENT PLACEMENT  02/18/1998   PCI/stents to ostial and mid RCA, emergently, residual Lt main disease  . LUNG SURGERY     right  . POLYPECTOMY     HPP 09-21-08       History of present illness and  Hospital Course:     Kindly see H&P for history of present illness and admission details, please review complete Labs, Consult reports and Test reports for all details in brief  HPI  from the history and physical done on the day of admission 03/21/2020  HPI: Mathew Hall is a 79 y.o. male with medical history significant of lung CA status post partial resection, hypertension, HLD, GERD, CAD with MI remote, presented with worsening back pain and cough.  Patient did eat grass cutting 2 days ago, and he said that usually after doing grass cutting cream we will have back pain for a day or 2.  But this time the back pain  did not go away after day 2, and he also developed a dry cough and exertional shortness of breath.  Denies any chest pains, no fever or chills.  No loss of taste or diarrhea. ED Course: COVID-19 positive, CT angiogram negative for PE but some loculated right sided pleural effusion.  Potassium 3.0, creatinine 1.2.  Patient was found to be very tachypneic and symptomatic when talking and minimal exertion.  Tachycardia.   Hospital Course   Incidental COVID-19 Infection in a patient is fully vaccinated.   - Although his CRP is elevated he has no hypoxia or oxygen demand at rest, CRP most likely elevated in the setting of empyema . -has finished his short course of steroids and Remdesivir and this problem seems to have resolved. -Patient received monoclonal antibody 9/22 after discussing with PCCM  Elevated D-dimers -D-dimers  elevated during hospital stay, he was kept on Lovenox 0.5 mg/kg every 12 hours,his CTA chest on admission negative for PE, as well  venous Doppler ultrasound was negative for DVT on admission, continues to have D-dimers trending up, it is 8.36 on discharge, patient is high risk for developing VTE, so he will be discharged on Eliquis DVT prophylaxis dose 2.5 mg p.o. twice daily for next 15 days, he was instructed to hold aspirin while he is on Eliquis and to resume aspirin after he is finishing his 15 days on Eliquis.   Empyema due to CAP/aspiration  -Status post thoracentesis 9/17 . - underwent chest tube placement on the right side on 03/25/2020, chest tube was discontinued 9/24 even improvement on repeat CT chest obtained 9/23. - pleural fluid growing Streptococcus intermedius.  On IV Rocephin, currently on cefazolin, antibiotics management per PCCM.  Need total of 4 weeks of antibiotic treatment, can be discharged on cephalexin for 3 more weeks. -Will require outpatient pulmonary follow-up as well, which has been arranged by PCCM  History of Metastatic nonsmall cell lung cancer, adenocarcinoma Right Lung cancer in the past ( 15 yrs ago, Dr Inda Merlin)  With status post neoadjuvant chemotherapy followed by right middle lobectomy with lymph node dissection . -Cytology from thoracentesis 9/17 with no malignant cells identified. -Follow-up with PCCM as an outpatient  Essential hypertension.   - On Norvasc, and a 3-second asymptomatic pause on telemetry on 03/27/2020 early morning, beta-blocker stopped.   History of CAD.  Chest pain-free no acute issues, beta-blockers has been stopped given 3-second asymptomatic pause on telemetry, aspirin will be held while he is on Eliquis, can be resumed after finishing Eliquis, continue with statin .   Right-sided parapneumonic effusion/empyema question silent aspiration.  Cleared by speech continue with supportive care as in #2 above.  3 second asymptomatic sinus pause on telemetry.  Discontinued beta-blocker,      Discharge Condition: Stable Discussed with daughter Olivia Mackie by  phone   Follow UP   Follow-up Information    [name], MD Follow up.   Specialty: Internal Medicine Contact information: [address] [id number] [phone]               Future Appointments  Date Time Provider [place]  03/31/2020  4:00 PM [facility] VASC US 2 [place]  04/12/2020 11:00 AM Lauraine Rinne, NP [facility] None  07/30/2020  8:30 AM [facility] None  07/31/2020  1:45 PM Curt Bears, MD [facility] None     Discharge Instructions  and  Discharge Medications    Discharge Instructions    Discharge instructions   Complete by: As  directed    Follow with Primary MD [name], MD in 7 days   Get CBC, CMP, 2 view Chest X ray checked  by Primary MD next visit.    Activity: As tolerated with Full fall precautions use walker/cane & assistance as needed   Disposition Home **   Diet: Heart Healthy ** , with feeding assistance and aspiration precautions.  For Heart failure patients - Check your Weight same time everyday, if you gain over 2 pounds, or you develop in leg swelling, experience more shortness of breath or chest pain, call your Primary MD immediately. Follow Cardiac Low Salt Diet and 1.5 lit/day fluid restriction.   On your next visit with your primary care physician please Get Medicines reviewed and adjusted.   Please request your Prim.MD to go over all Hospital Tests and Procedure/Radiological results at the follow up, please get all Hospital records sent to your Prim MD by signing hospital release before you go home.   If you experience worsening of your admission symptoms, develop shortness of breath, life threatening emergency, suicidal or homicidal thoughts you must seek medical attention immediately by calling 911 or calling your MD immediately  if symptoms less severe.  You Must read complete instructions/literature along with all the possible adverse reactions/side effects for all the  Medicines you take and that have been prescribed to you. Take any new Medicines after you have completely understood and accpet all the possible adverse reactions/side effects.   Do not drive, operating heavy machinery, perform activities at heights, swimming or participation in water activities or provide baby sitting services if your were admitted for syncope or siezures until you have seen by Primary MD or a Neurologist and advised to do so again.  Do not drive when taking Pain medications.    Do not take more than prescribed Pain, Sleep and Anxiety Medications  Special Instructions: If you have smoked or chewed Tobacco  in the last 2 yrs please stop smoking, stop any regular Alcohol  and or any Recreational drug use.  Wear Seat belts while driving.   Please note  You were cared for by a hospitalist during your hospital stay. If you have any questions about your discharge medications or the care you received while you were in the hospital after you are discharged, you can call the unit and asked to speak with the hospitalist on call if the hospitalist that took care of you is not available. Once you are discharged, your primary care physician will handle any further medical issues. Please note that NO REFILLS for any discharge medications will be authorized once you are discharged, as it is imperative that you return to your primary care physician (or establish a relationship with a primary care physician if you do not have one) for your aftercare needs so that they can reassess your need for medications and monitor your lab values.   Increase activity slowly   Complete by: As directed    MyChart COVID-19 home monitoring program   Complete by: Mar 31, 2020    Is the patient willing to use the [place] for home monitoring?: Yes   Temperature monitoring   Complete by: Mar 31, 2020    After how many days would you like to receive a notification of this patient's flowsheet entries?:  1     Allergies as of 03/31/2020      Reactions   Lipitor [atorvastatin Calcium]    Knee pain   Atorvastatin Other (See  Comments)   Joint/knee pain      Medication List    STOP taking these medications   metoprolol tartrate 25 MG tablet Commonly known as: LOPRESSOR   NexIUM 20 MG capsule Generic drug: esomeprazole Replaced by: pantoprazole 40 MG tablet     TAKE these medications   amLODipine 5 MG tablet Commonly known as: NORVASC Take 1 tablet by mouth daily.   apixaban 2.5 MG Tabs tablet Commonly known as: Eliquis Take 1 tablet (2.5 mg total) by mouth 2 (two) times daily for 15 days.   aspirin 325 MG EC tablet Take 1 tablet (325 mg total) by mouth daily. Please hold for 15 days when you are on Eliquis, resume on 10/11 once you are off Eliquis. Start taking on: April 16, 2020 What changed:   additional instructions  These instructions start on April 16, 2020. If you are unsure what to do until then, ask your doctor or other care provider.   cephALEXin 500 MG capsule Commonly known as: KEFLEX Take 1 capsule (500 mg total) by mouth 3 (three) times daily for 21 days.   ergocalciferol 1.25 MG (50000 UT) capsule Commonly known as: VITAMIN D2 Take 50,000 Units by mouth once a week.   nitroGLYCERIN 0.4 MG SL tablet Commonly known as: NITROSTAT Place 1 tablet (0.4 mg total) under the tongue every 5 (five) minutes as needed for chest pain.   oxybutynin 5 MG tablet Commonly known as: DITROPAN Take 5 mg by mouth every 8 (eight) hours as needed for bladder spasms.   pantoprazole 40 MG tablet Commonly known as: PROTONIX Take 1 tablet (40 mg total) by mouth daily. Start taking on: April 01, 2020 Replaces: NexIUM 20 MG capsule   saccharomyces boulardii 250 MG capsule Commonly known as: Florastor Take 1 capsule (250 mg total) by mouth 2 (two) times daily.   simvastatin 40 MG tablet Commonly known as: ZOCOR Take 1 tablet (40 mg total) by mouth daily at 6  PM.         Diet and Activity recommendation: See Discharge Instructions above   Consults obtained -  PCCM   Major procedures and Radiology Reports - PLEASE review detailed and final reports for all details, in brief -  Right-sided chest tube placement on 03/25/2020 by pulmonary.  Discontinued 9/24   Right-sided ultrasound-guided thoracentesis on 03/23/2020.  Exudative fluid.  No malignant cells in cytology  DG Chest 1 View  Result Date: 03/25/2020 CLINICAL DATA:  Chest tube in place EXAM: CHEST  1 VIEW COMPARISON:  March 23, 2020 FINDINGS: A pigtail right chest tube is been placed. The right-sided pleural effusion is smaller although effusion and underlying opacity does persist. Postoperative changes in the right base. No pneumothorax. The left lung is clear. The cardiomediastinal silhouette is stable. IMPRESSION: 1. A chest tube is been placed on the right. The right-sided pleural effusion persists but is smaller. Electronically Signed   By: Dorise Bullion III M.D   On: 03/25/2020 19:50   DG Chest 2 View  Result Date: 03/21/2020 CLINICAL DATA:  79 year old male with cough and back pain. Former smoker. Previous right middle lobectomy. EXAM: CHEST - 2 VIEW COMPARISON:  Chest CT 07/29/2019 and earlier. FINDINGS: Right lung volume loss and architectural distortion similar to the January CT. However, with increased streaky and indistinct right lung base opacity since that time. Left lung volume is stable. No superimposed pneumothorax. No definite pleural effusion. No pulmonary edema. Mediastinal contours are stable and within normal limits. Visualized tracheal air  column is within normal limits. No acute osseous abnormality identified. Negative visible bowel gas pattern. IMPRESSION: Sequelae of prior right middle lobectomy, but with superimposed new interstitial opacity in the right lower lung. Favor acute viral/atypical respiratory infection. No definite pleural effusion. Recommend  Followup PA and lateral chest X-ray, or alternatively repeat restaging Chest CT, following therapy to ensure resolution and exclude recurrent malignancy. Electronically Signed   By: Genevie Ann M.D.   On: 03/21/2020 22:26   CT CHEST WO CONTRAST  Result Date: 03/29/2020 CLINICAL DATA:  Precarinal lymphadenopathy on prior study. History of lung nodule. EXAM: CT CHEST WITHOUT CONTRAST TECHNIQUE: Multidetector CT imaging of the chest was performed following the standard protocol without IV contrast. COMPARISON:  03/22/2020 FINDINGS: Cardiovascular: The heart size is normal. No substantial pericardial effusion. Coronary artery calcification is evident. Atherosclerotic calcification is noted in the wall of the thoracic aorta. Mediastinum/Nodes: 13 mm short axis precarinal node is unchanged. No other mediastinal lymphadenopathy evident. No evidence for gross hilar lymphadenopathy although assessment is limited by the lack of intravenous contrast on today's study. The esophagus has normal imaging features. There is no axillary lymphadenopathy. Lungs/Pleura: The low-density nodular process along the right major fissure (69/6) is similar to prior study and new since 07/29/2019. Low-density suggests that this may be loculated fluid. Right parahilar staple line again noted. Posterior pleural drain is identified with pigtail loop formed posteromedially. There is residual fluid/debris/gas in the posterior right pleural space. Residual collapse/consolidative opacity noted in the posterior right lower lobe. Subsegmental atelectasis in the left lower lobe is new in the interval. No left effusion. Upper Abdomen: Tiny calcified gallstone evident. Musculoskeletal: No worrisome lytic or sclerotic osseous abnormality. IMPRESSION: 1. The low-density nodular process along the right major fissure is similar to prior study and new since 07/29/2019. Low-density suggests that this is loculated fluid. Continued close attention recommended. 2.  Posterior right pleural drain with interval decrease in right pleural fluid and residual fluid/debris/gas in the posterior right pleural space. 3. Residual collapse/consolidative opacity in the posterior right lower lobe. 4. New subsegmental atelectasis in the left lower lobe. 5. Cholelithiasis. 6. Aortic Atherosclerosis (ICD10-I70.0). Electronically Signed   By: Misty Stanley M.D.   On: 03/29/2020 10:05   CT Angio Chest PE W and/or Wo Contrast  Result Date: 03/22/2020 CLINICAL DATA:  Abdominal pain, pleuritic RIGHT chest pain, history lung cancer post resection, possible RIGHT groin hernia, prior abnormal CT chest EXAM: CT ANGIOGRAPHY CHEST CT ABDOMEN AND PELVIS WITH CONTRAST TECHNIQUE: Multidetector CT imaging of the chest was performed using the standard protocol during bolus administration of intravenous contrast. Multiplanar CT image reconstructions and MIPs were obtained to evaluate the vascular anatomy. Multidetector CT imaging of the abdomen and pelvis was performed using the standard protocol during bolus administration of intravenous contrast. CONTRAST:  131mL OMNIPAQUE IOHEXOL 350 MG/ML SOLN IV. No oral contrast. COMPARISON:  CT chest 07/29/2019 FINDINGS: CTA CHEST FINDINGS Cardiovascular: Atherosclerotic calcifications of thoracic aorta and coronary arteries. Aorta normal caliber without aneurysm or dissection. Heart unremarkable. No pericardial effusion. Pulmonary arteries adequately opacified. Scattered respiratory motion artifacts at the lower lobes. No definite pulmonary emboli identified. Mediastinum/Nodes: Esophagus unremarkable. Base of cervical region normal appearance. 11 mm precarinal node slightly increased from previous exam. Additional small normal size mediastinal lymph nodes without additional thoracic adenopathy. Lungs/Pleura: Partially loculated RIGHT pleural effusion. Compressive atelectasis of RIGHT lower lobe. Prior RIGHT middle lobe resection. Loculated fluid in RIGHT major  fissure posteriorly. Underlying emphysematous changes. No acute infiltrate, pleural effusion  or pneumothorax. Small focus of scarring in the RIGHT upper lobe unchanged image 29. Musculoskeletal: No acute osseous findings. Review of the MIP images confirms the above findings. CT ABDOMEN and PELVIS FINDINGS Hepatobiliary: Calcified gallstone dependently in gallbladder measuring 5 mm diameter. Gallbladder and liver otherwise normal appearance. Pancreas: Normal appearance Spleen: Normal appearance Adrenals/Urinary Tract: Adrenal glands kidneys, ureters, and bladder normal appearance Stomach/Bowel: Short segment of appendiceal base normal appearance. Stomach and bowel loops normal appearance. Vascular/Lymphatic: Atherosclerotic calcifications of aortic and iliac arteries without aneurysm. No adenopathy. Reproductive: Prostatic enlargement gland measuring 6.5 x 6.5 x 5.5 cm (volume = 120 cm^3) Other: BILATERAL inguinal hernias containing fat larger on RIGHT. No free air or free fluid. No inflammatory process. Tiny umbilical hernia containing fat. Musculoskeletal: Bones demineralized. Degenerative disc disease changes thoracolumbar spine. Review of the MIP images confirms the above findings. IMPRESSION: No evidence of pulmonary embolism. Partially loculated RIGHT pleural effusion with compressive atelectasis of the RIGHT lower lobe. Prior RIGHT middle lobe resection. Single minimally enlarged precarinal lymph node 11 mm short axis. No acute intra-abdominal or intrapelvic abnormalities. Prostatic enlargement. BILATERAL inguinal hernias containing fat larger on RIGHT. Cholelithiasis. Aortic Atherosclerosis (ICD10-I70.0) and Emphysema (ICD10-J43.9). Electronically Signed   By: Lavonia Dana M.D.   On: 03/22/2020 15:03   CT ABDOMEN PELVIS W CONTRAST  Result Date: 03/22/2020 CLINICAL DATA:  Abdominal pain, pleuritic RIGHT chest pain, history lung cancer post resection, possible RIGHT groin hernia, prior abnormal CT chest  EXAM: CT ANGIOGRAPHY CHEST CT ABDOMEN AND PELVIS WITH CONTRAST TECHNIQUE: Multidetector CT imaging of the chest was performed using the standard protocol during bolus administration of intravenous contrast. Multiplanar CT image reconstructions and MIPs were obtained to evaluate the vascular anatomy. Multidetector CT imaging of the abdomen and pelvis was performed using the standard protocol during bolus administration of intravenous contrast. CONTRAST:  137mL OMNIPAQUE IOHEXOL 350 MG/ML SOLN IV. No oral contrast. COMPARISON:  CT chest 07/29/2019 FINDINGS: CTA CHEST FINDINGS Cardiovascular: Atherosclerotic calcifications of thoracic aorta and coronary arteries. Aorta normal caliber without aneurysm or dissection. Heart unremarkable. No pericardial effusion. Pulmonary arteries adequately opacified. Scattered respiratory motion artifacts at the lower lobes. No definite pulmonary emboli identified. Mediastinum/Nodes: Esophagus unremarkable. Base of cervical region normal appearance. 11 mm precarinal node slightly increased from previous exam. Additional small normal size mediastinal lymph nodes without additional thoracic adenopathy. Lungs/Pleura: Partially loculated RIGHT pleural effusion. Compressive atelectasis of RIGHT lower lobe. Prior RIGHT middle lobe resection. Loculated fluid in RIGHT major fissure posteriorly. Underlying emphysematous changes. No acute infiltrate, pleural effusion or pneumothorax. Small focus of scarring in the RIGHT upper lobe unchanged image 29. Musculoskeletal: No acute osseous findings. Review of the MIP images confirms the above findings. CT ABDOMEN and PELVIS FINDINGS Hepatobiliary: Calcified gallstone dependently in gallbladder measuring 5 mm diameter. Gallbladder and liver otherwise normal appearance. Pancreas: Normal appearance Spleen: Normal appearance Adrenals/Urinary Tract: Adrenal glands kidneys, ureters, and bladder normal appearance Stomach/Bowel: Short segment of appendiceal  base normal appearance. Stomach and bowel loops normal appearance. Vascular/Lymphatic: Atherosclerotic calcifications of aortic and iliac arteries without aneurysm. No adenopathy. Reproductive: Prostatic enlargement gland measuring 6.5 x 6.5 x 5.5 cm (volume = 120 cm^3) Other: BILATERAL inguinal hernias containing fat larger on RIGHT. No free air or free fluid. No inflammatory process. Tiny umbilical hernia containing fat. Musculoskeletal: Bones demineralized. Degenerative disc disease changes thoracolumbar spine. Review of the MIP images confirms the above findings. IMPRESSION: No evidence of pulmonary embolism. Partially loculated RIGHT pleural effusion with compressive atelectasis of the RIGHT lower  lobe. Prior RIGHT middle lobe resection. Single minimally enlarged precarinal lymph node 11 mm short axis. No acute intra-abdominal or intrapelvic abnormalities. Prostatic enlargement. BILATERAL inguinal hernias containing fat larger on RIGHT. Cholelithiasis. Aortic Atherosclerosis (ICD10-I70.0) and Emphysema (ICD10-J43.9). Electronically Signed   By: Lavonia Dana M.D.   On: 03/22/2020 15:03   DG Chest Port 1 View  Result Date: 03/28/2020 CLINICAL DATA:  Empyema EXAM: PORTABLE CHEST 1 VIEW COMPARISON:  03/27/2020 FINDINGS: Right basilar chest tube remains in place. No visible pneumothorax. Postoperative changes on the right. Right basilar atelectasis. Left lung clear. Heart is normal size. IMPRESSION: Postoperative changes on the right with right base atelectasis. No visible pneumothorax. Electronically Signed   By: Rolm Baptise M.D.   On: 03/28/2020 08:41   DG Chest Port 1 View  Result Date: 03/27/2020 CLINICAL DATA:  Empyema.  Chest tube.  COVID. EXAM: PORTABLE CHEST 1 VIEW COMPARISON:  03/26/2020. FINDINGS: Right chest tube in stable position. No pneumothorax. Mediastinum hilar structures normal. Persistent right base atelectasis/infiltrate. Persistent small right pleural effusion. Small amount of fluid may  be in the right major fissure. Interim improvement in aeration in the left lung base. Stable deformity of the right posterior seventh rib. Degenerative changes scoliosis thoracic spine. IMPRESSION: 1.  Right chest tube in stable position.  No pneumothorax. 2. Persistent right base atelectasis/infiltrate. Persistent small right pleural effusion. Small amount of fluid may be present in the right major fissure. Similar findings noted on prior exam. Interim improvement in aeration in the left lung base. Electronically Signed   By: Marcello Moores  Register   On: 03/27/2020 06:27   DG Chest Port 1 View  Result Date: 03/26/2020 CLINICAL DATA:  Reason for exam: chest tube in place EXAM: PORTABLE CHEST - 1 VIEW COMPARISON:  the previous day's study FINDINGS: Stable pigtail chest tube at the right lung base. No pneumothorax. Patchy atelectasis/consolidation at the lung bases right greater than left. Staple lines in the right mid lung. Heart size normal.  Aortic Atherosclerosis (ICD10-170.0). There may be a small loculated right pleural effusion laterally at the base. Previous right thoracotomy. IMPRESSION: Stable right chest tube with no pneumothorax. Electronically Signed   By: Lucrezia Europe M.D.   On: 03/26/2020 07:03   DG CHEST PORT 1 VIEW  Result Date: 03/23/2020 CLINICAL DATA:  Status post thoracentesis. EXAM: PORTABLE CHEST 1 VIEW COMPARISON:  03/21/2020 and chest CT, 03/22/2020. FINDINGS: There is there is opacity at the right lung base at is similar to the previous day's CT scan, consistent with a combination of residual fluid and atelectasis. Postsurgical changes are noted with right mid to lower lung anastomosis staples in the few surgical vascular clips. No pneumothorax. Left lung is hyperexpanded but clear. IMPRESSION: 1. No pneumothorax or evidence of a complication following thoracentesis. 2. Persistent opacity at the right lung base consistent with a combination of residual pleural fluid and atelectasis.  Postsurgical changes in the right lower lung are unchanged from the previous chest radiograph. Electronically Signed   By: Lajean Manes M.D.   On: 03/23/2020 14:54   VAS [place] LOWER EXTREMITY VENOUS (DVT)  Result Date: 03/23/2020  Lower Venous DVTStudy Indications: + Covid, and Swelling.  Risk Factors: None identified. Performing Technologist: Griffin Basil RCT RDMS  Examination Guidelines: A complete evaluation includes B-mode imaging, spectral Doppler, color Doppler, and power Doppler as needed of all accessible portions of each vessel. Bilateral testing is considered an integral part of a complete examination. Limited examinations for reoccurring indications may be performed  as noted. The reflux portion of the exam is performed with the patient in reverse Trendelenburg.  +---------+---------------+---------+-----------+----------+--------------+ RIGHT    CompressibilityPhasicitySpontaneityPropertiesThrombus Aging +---------+---------------+---------+-----------+----------+--------------+ CFV      Full           Yes      Yes                                 +---------+---------------+---------+-----------+----------+--------------+ SFJ      Full                                                        +---------+---------------+---------+-----------+----------+--------------+ FV Prox  Full                                                        +---------+---------------+---------+-----------+----------+--------------+ FV Mid   Full                                                        +---------+---------------+---------+-----------+----------+--------------+ FV DistalFull                                                        +---------+---------------+---------+-----------+----------+--------------+ PFV      Full                                                        +---------+---------------+---------+-----------+----------+--------------+ POP      Full            Yes      Yes                                 +---------+---------------+---------+-----------+----------+--------------+ PTV      Full                                                        +---------+---------------+---------+-----------+----------+--------------+ PERO     Full                                                        +---------+---------------+---------+-----------+----------+--------------+   +---------+---------------+---------+-----------+----------+--------------+ LEFT     CompressibilityPhasicitySpontaneityPropertiesThrombus Aging +---------+---------------+---------+-----------+----------+--------------+ CFV      Full           Yes  Yes                                 +---------+---------------+---------+-----------+----------+--------------+ SFJ      Full                                                        +---------+---------------+---------+-----------+----------+--------------+ FV Prox  Full                                                        +---------+---------------+---------+-----------+----------+--------------+ FV Mid   Full                                                        +---------+---------------+---------+-----------+----------+--------------+ FV DistalFull                                                        +---------+---------------+---------+-----------+----------+--------------+ PFV      Full                                                        +---------+---------------+---------+-----------+----------+--------------+ POP      Full           Yes      Yes                                 +---------+---------------+---------+-----------+----------+--------------+ PTV      Full                                                        +---------+---------------+---------+-----------+----------+--------------+ PERO     Full                                                         +---------+---------------+---------+-----------+----------+--------------+     Summary: RIGHT: - There is no evidence of deep vein thrombosis in the lower extremity.  - No cystic structure found in the popliteal fossa.  LEFT: - There is no evidence of deep vein thrombosis in the lower extremity.  - No cystic structure found in the popliteal fossa.  *See table(s) above for measurements and observations. Electronically signed by Servando Snare MD on 03/23/2020 at 12:08:48 PM.    Final  Micro Results    Recent Results (from the past 240 hour(s))  SARS Coronavirus 2 by RT PCR (hospital order, performed in [facility] hospital lab) Nasopharyngeal Nasopharyngeal Swab     Status: Abnormal   Collection Time: 03/22/20  1:18 PM   Specimen: Nasopharyngeal Swab  Result Value Ref Range Status   SARS Coronavirus 2 POSITIVE (A) NEGATIVE Final    Comment: RESULT CALLED TO, READ BACK BY AND VERIFIED WITHKeturah Barre RN 1528 03/22/20 A BROWNING (NOTE) SARS-CoV-2 target nucleic acids are DETECTED  SARS-CoV-2 RNA is generally detectable in upper respiratory specimens  during the acute phase of infection.  Positive results are indicative  of the presence of the identified virus, but do not rule out bacterial infection or co-infection with other pathogens not detected by the test.  Clinical correlation with patient history and  other diagnostic information is necessary to determine patient infection status.  The expected result is negative.  Fact Sheet for Patients:   StrictlyIdeas.no   Fact Sheet for Healthcare Providers:   BankingDealers.co.za    This test is not yet approved or cleared by the [place] FDA and  has been authorized for detection and/or diagnosis of SARS-CoV-2 by FDA under an Emergency Use Authorization (EUA).  This EUA will remain in effect (meaning this  test can be used) for the duration of  the COVID-19 declaration under  Section 564(b)(1) of the Act, 21 U.S.C. section 360-bbb-3(b)(1), unless the authorization is terminated or revoked sooner.  Performed at [facility], [place] [address] [place], [place] [address]   Body fluid culture     Status: None   Collection Time: 03/23/20  3:08 PM   Specimen: Pleura; Body Fluid  Result Value Ref Range Status   Specimen Description PLEURAL  Final   Special Requests PLEURAL R  Final   Gram Stain   Final    ABUNDANT WBC PRESENT, PREDOMINANTLY PMN NO ORGANISMS SEEN    Culture   Final    RARE STREPTOCOCCUS INTERMEDIUS CRITICAL RESULT CALLED TO, READ BACK BY AND VERIFIED WITH: RN Sherlyn Lick [phone] FCP Performed at [facility], [address], [place], [place] [address]    Report Status 03/27/2020 FINAL  Final   Organism ID, Bacteria STREPTOCOCCUS INTERMEDIUS  Final      Susceptibility   Streptococcus intermedius - MIC*    PENICILLIN <=0.06 SENSITIVE Sensitive     CEFTRIAXONE <=0.12 SENSITIVE Sensitive     ERYTHROMYCIN <=0.12 SENSITIVE Sensitive     LEVOFLOXACIN 0.5 SENSITIVE Sensitive     VANCOMYCIN 0.5 SENSITIVE Sensitive     * RARE STREPTOCOCCUS INTERMEDIUS       Today   Subjective:   Mathew Hall today has no headache,no chest abdominal pain,no new weakness tingling or numbness, feels much better wants to go home today.   Objective:   Blood pressure 114/84, pulse 95, temperature 98.2 F (36.8 C), temperature source Oral, resp. rate 20, height 5\' 10"  (1.778 m), weight 76.4 kg, SpO2 98 %.   Intake/Output Summary (Last 24 hours) at 03/31/2020 1539 Last data filed at 03/31/2020 1449 Gross per 24 hour  Intake 360 ml  Output 725 ml  Net -365 ml    Exam Awake Alert, Oriented x 3, No new F.N deficits, Normal affect Symmetrical Chest wall movement, Good air movement bilaterally, CTAB RRR,No Gallops,Rubs or new Murmurs, No Parasternal Heave +ve B.Sounds, Abd Soft, Non tender, No organomegaly appriciated, No rebound -guarding or  rigidity. No Cyanosis,  Clubbing or edema, No new Rash or bruise  Data Review   CBC w Diff:  Lab Results  Component Value Date   WBC 8.7 03/31/2020   HGB 11.1 (L) 03/31/2020   HGB 13.9 07/29/2019   HGB 14.1 01/16/2017   HCT 34.6 (L) 03/31/2020   HCT 42.7 01/16/2017   PLT 356 03/31/2020   PLT 198 07/29/2019   PLT 168 01/16/2017   LYMPHOPCT 14 03/31/2020   LYMPHOPCT 25.3 01/16/2017   MONOPCT 13 03/31/2020   MONOPCT 11.7 01/16/2017   EOSPCT 1 03/31/2020   EOSPCT 1.7 01/16/2017   BASOPCT 0 03/31/2020   BASOPCT 0.7 01/16/2017    CMP:  Lab Results  Component Value Date   NA 131 (L) 03/31/2020   NA 141 01/16/2017   K 4.2 03/31/2020   K 4.2 01/16/2017   CL 98 03/31/2020   CL 105 07/13/2012   CO2 26 03/31/2020   CO2 27 01/16/2017   BUN 13 03/31/2020   BUN 16.6 01/16/2017   CREATININE 0.94 03/31/2020   CREATININE 1.15 07/29/2019   CREATININE 1.1 01/16/2017   PROT 6.9 03/31/2020   PROT 7.9 01/16/2017   ALBUMIN 2.5 (L) 03/31/2020   ALBUMIN 4.0 01/16/2017   BILITOT 0.9 03/31/2020   BILITOT 0.9 07/29/2019   BILITOT 0.75 01/16/2017   ALKPHOS 62 03/31/2020   ALKPHOS 95 01/16/2017   AST 27 03/31/2020   AST 14 (L) 07/29/2019   AST 15 01/16/2017   ALT 25 03/31/2020   ALT 8 07/29/2019   ALT 11 01/16/2017  .   Total Time in preparing paper work, data evaluation and todays exam - 58 minutes  Phillips Climes M.D on 03/31/2020 at 3:39 PM  [facility]   Office  [phone]

## 2020-03-31 NOTE — Discharge Instructions (Signed)
Person Under Monitoring Name: Mathew Hall  Location: [place] [address]   Infection Prevention Recommendations for Individuals Confirmed to have, or Being Evaluated for, 2019 Novel Coronavirus (COVID-19) Infection Who Receive Care at Home  Individuals who are confirmed to have, or are being evaluated for, COVID-19 should follow the prevention steps below until a healthcare provider or local or state health department says they can return to normal activities.  Stay home except to get medical care You should restrict activities outside your home, except for getting medical care. Do not go to work, school, or public areas, and do not use public transportation or taxis.  Call ahead before visiting your doctor Before your medical appointment, call the healthcare provider and tell them that you have, or are being evaluated for, COVID-19 infection. This will help the healthcare provider's office take steps to keep other people from getting infected. Ask your healthcare provider to call the local or state health department.  Monitor your symptoms Seek prompt medical attention if your illness is worsening (e.g., difficulty breathing). Before going to your medical appointment, call the healthcare provider and tell them that you have, or are being evaluated for, COVID-19 infection. Ask your healthcare provider to call the local or state health department.  Wear a facemask You should wear a facemask that covers your nose and mouth when you are in the same room with other people and when you visit a healthcare provider. People who live with or visit you should also wear a facemask while they are in the same room with you.  Separate yourself from other people in your home As much as possible, you should stay in a different room from other people in your home. Also, you should use a separate bathroom, if available.  Avoid sharing household items You should  not share dishes, drinking glasses, cups, eating utensils, towels, bedding, or other items with other people in your home. After using these items, you should wash them thoroughly with soap and water.  Cover your coughs and sneezes Cover your mouth and nose with a tissue when you cough or sneeze, or you can cough or sneeze into your sleeve. Throw used tissues in a lined trash can, and immediately wash your hands with soap and water for at least 20 seconds or use an alcohol-based hand rub.  Wash your [facility] your hands often and thoroughly with soap and water for at least 20 seconds. You can use an alcohol-based hand sanitizer if soap and water are not available and if your hands are not visibly dirty. Avoid touching your eyes, nose, and mouth with unwashed hands.   Prevention Steps for Caregivers and Household Members of Individuals Confirmed to have, or Being Evaluated for, COVID-19 Infection Being Cared for in the Home  If you live with, or provide care at home for, a person confirmed to have, or being evaluated for, COVID-19 infection please follow these guidelines to prevent infection:  Follow healthcare provider's instructions Make sure that you understand and can help the patient follow any healthcare provider instructions for all care.  Provide for the patient's basic needs You should help the patient with basic needs in the home and provide support for getting groceries, prescriptions, and other personal needs.  Monitor the patient's symptoms If they are getting sicker, call his or her medical provider and tell them that the patient has, or is being evaluated for, COVID-19 infection. This will help the healthcare provider's office  take steps to keep other people from getting infected. Ask the healthcare provider to call the local or state health department.  Limit the number of people who have contact with the patient  If possible, have only one caregiver for the  patient.  Other household members should stay in another home or place of residence. If this is not possible, they should stay  in another room, or be separated from the patient as much as possible. Use a separate bathroom, if available.  Restrict visitors who do not have an essential need to be in the home.  Keep older adults, very young children, and other sick people away from the patient Keep older adults, very young children, and those who have compromised immune systems or chronic health conditions away from the patient. This includes people with chronic heart, lung, or kidney conditions, diabetes, and cancer.  Ensure good ventilation Make sure that shared spaces in the home have good air flow, such as from an air conditioner or an opened window, weather permitting.  Wash your hands often  Wash your hands often and thoroughly with soap and water for at least 20 seconds. You can use an alcohol based hand sanitizer if soap and water are not available and if your hands are not visibly dirty.  Avoid touching your eyes, nose, and mouth with unwashed hands.  Use disposable paper towels to dry your hands. If not available, use dedicated cloth towels and replace them when they become wet.  Wear a facemask and gloves  Wear a disposable facemask at all times in the room and gloves when you touch or have contact with the patient's blood, body fluids, and/or secretions or excretions, such as sweat, saliva, sputum, nasal mucus, vomit, urine, or feces.  Ensure the mask fits over your nose and mouth tightly, and do not touch it during use.  Throw out disposable facemasks and gloves after using them. Do not reuse.  Wash your hands immediately after removing your facemask and gloves.  If your personal clothing becomes contaminated, carefully remove clothing and launder. Wash your hands after handling contaminated clothing.  Place all used disposable facemasks, gloves, and other waste in a lined  container before disposing them with other household waste.  Remove gloves and wash your hands immediately after handling these items.  Do not share dishes, glasses, or other household items with the patient  Avoid sharing household items. You should not share dishes, drinking glasses, cups, eating utensils, towels, bedding, or other items with a patient who is confirmed to have, or being evaluated for, COVID-19 infection.  After the person uses these items, you should wash them thoroughly with soap and water.  Wash laundry thoroughly  Immediately remove and wash clothes or bedding that have blood, body fluids, and/or secretions or excretions, such as sweat, saliva, sputum, nasal mucus, vomit, urine, or feces, on them.  Wear gloves when handling laundry from the patient.  Read and follow directions on labels of laundry or clothing items and detergent. In general, wash and dry with the warmest temperatures recommended on the label.  Clean all areas the individual has used often  Clean all touchable surfaces, such as counters, tabletops, doorknobs, bathroom fixtures, toilets, phones, keyboards, tablets, and bedside tables, every day. Also, clean any surfaces that may have blood, body fluids, and/or secretions or excretions on them.  Wear gloves when cleaning surfaces the patient has come in contact with.  Use a diluted bleach solution (e.g., dilute bleach with 1 part  bleach and 10 parts water) or a household disinfectant with a label that says EPA-registered for coronaviruses. To make a bleach solution at home, add 1 tablespoon of bleach to 1 quart (4 cups) of water. For a larger supply, add  cup of bleach to 1 gallon (16 cups) of water.  Read labels of cleaning products and follow recommendations provided on product labels. Labels contain instructions for safe and effective use of the cleaning product including precautions you should take when applying the product, such as wearing gloves or  eye protection and making sure you have good ventilation during use of the product.  Remove gloves and wash hands immediately after cleaning.  Monitor yourself for signs and symptoms of illness Caregivers and household members are considered close contacts, should monitor their health, and will be asked to limit movement outside of the home to the extent possible. Follow the monitoring steps for close contacts listed on the symptom monitoring form.   ? If you have additional questions, contact your local health department or call the epidemiologist on call at [phone] (available 24/7). ? This guidance is subject to change. For the most up-to-date guidance from [facility], please refer to their website: YouBlogs.pl  Follow with Primary MD [name], MD in 7 days   Get CBC, CMP, 2 view Chest X ray checked  by Primary MD next visit.    Activity: As tolerated with Full fall precautions use walker/cane & assistance as needed   Disposition Home    Diet: Heart Healthy  , with feeding assistance and aspiration precautions.  For Heart failure patients - Check your Weight same time everyday, if you gain over 2 pounds, or you develop in leg swelling, experience more shortness of breath or chest pain, call your Primary MD immediately. Follow Cardiac Low Salt Diet and 1.5 lit/day fluid restriction.   On your next visit with your primary care physician please Get Medicines reviewed and adjusted.   Please request your Prim.MD to go over all Hospital Tests and Procedure/Radiological results at the follow up, please get all Hospital records sent to your Prim MD by signing hospital release before you go home.   If you experience worsening of your admission symptoms, develop shortness of breath, life threatening emergency, suicidal or homicidal thoughts you must seek medical attention immediately by calling 911 or calling your MD  immediately  if symptoms less severe.  You Must read complete instructions/literature along with all the possible adverse reactions/side effects for all the Medicines you take and that have been prescribed to you. Take any new Medicines after you have completely understood and accpet all the possible adverse reactions/side effects.   Do not drive, operating heavy machinery, perform activities at heights, swimming or participation in water activities or provide baby sitting services if your were admitted for syncope or siezures until you have seen by Primary MD or a Neurologist and advised to do so again.  Do not drive when taking Pain medications.    Do not take more than prescribed Pain, Sleep and Anxiety Medications  Special Instructions: If you have smoked or chewed Tobacco  in the last 2 yrs please stop smoking, stop any regular Alcohol  and or any Recreational drug use.  Wear Seat belts while driving.   Please note  You were cared for by a hospitalist during your hospital stay. If you have any questions about your discharge medications or the care you received while you were in the hospital after you are discharged,  you can call the unit and asked to speak with the hospitalist on call if the hospitalist that took care of you is not available. Once you are discharged, your primary care physician will handle any further medical issues. Please note that NO REFILLS for any discharge medications will be authorized once you are discharged, as it is imperative that you return to your primary care physician (or establish a relationship with a primary care physician if you do not have one) for your aftercare needs so that they can reassess your need for medications and monitor your lab values.

## 2020-03-31 NOTE — TOC Transition Note (Signed)
Transition of Care [facility]) - CM/SW Discharge Note   Patient Details  Name: Mathew Hall MRN: [id number] Date of Birth: January 21, 1941  [facility] CM/SW Contact:  Carles Collet, RN Phone Number: 03/31/2020, 4:02 PM   Clinical Narrative:    [name] w patient over the phone. He was very pleasant and appreciative of his care. He states he feels fine and is ready to go home. He declines offer for [facility] services. He was given Eliquis card already, I reviewed its use, he verbalized understanding. No there CM needs identified.     Final next level of care: Home/Self Care Barriers to Discharge: No Barriers Identified   Patient Goals and CMS Choice        Discharge Placement                       Discharge Plan and Services                                     Social Determinants of Health (SDOH) Interventions     Readmission Risk Interventions No flowsheet data found.

## 2020-04-12 ENCOUNTER — Ambulatory Visit: Payer: Medicare Other | Admitting: Pulmonary Disease

## 2020-04-23 ENCOUNTER — Ambulatory Visit: Payer: Medicare Other | Admitting: Pulmonary Disease

## 2020-04-23 ENCOUNTER — Ambulatory Visit (INDEPENDENT_AMBULATORY_CARE_PROVIDER_SITE_OTHER): Payer: Medicare Other

## 2020-04-23 ENCOUNTER — Other Ambulatory Visit: Payer: Self-pay

## 2020-04-23 ENCOUNTER — Encounter: Payer: Self-pay | Admitting: Pulmonary Disease

## 2020-04-23 VITALS — BP 100/50 | HR 62 | Ht 70.0 in | Wt 154.8 lb

## 2020-04-23 DIAGNOSIS — C342 Malignant neoplasm of middle lobe, bronchus or lung: Secondary | ICD-10-CM | POA: Diagnosis not present

## 2020-04-23 DIAGNOSIS — Z8616 Personal history of COVID-19: Secondary | ICD-10-CM

## 2020-04-23 DIAGNOSIS — J90 Pleural effusion, not elsewhere classified: Secondary | ICD-10-CM

## 2020-04-23 DIAGNOSIS — J869 Pyothorax without fistula: Secondary | ICD-10-CM | POA: Diagnosis not present

## 2020-04-23 NOTE — Progress Notes (Signed)
@Patient  ID: Fayne Mediate, male    DOB: [birth year], 79 y.o.   MRN: [id number]  Chief Complaint  Patient presents with  . Hospitalization Follow-up    Admitted to hospital on 9/15 for Covid, Lost over 16 pounds, still weak, feels like breathing is better and has to clear throat from sinus drainage. Last 3 nights when he lays down his right lung hurts.     Referring provider: [name], MD  HPI:  79 year old male former smoker initially consulted with practice in September/2021 upon hospitalization with empyema requiring chest tube as well as COVID-19 infection (September/2021)  PMH: History of right adenocarcinoma status post treatment with Dr. Earlie Server 15 years ago, CAD, GERD, hyperlipidemia Smoker/ Smoking History: Former smoker Maintenance: None Pt of: Needs outpatient pulmonary provider  04/23/2020  - Visit   79 year old male former smoker initially consulted with our practice while inpatient in September/2021. Consult date 03/23/2020.  An excerpt of the discharge summary is listed below:  Admission date:  03/21/2020  Admitting Physician  Lequita Halt, MD  Discharge Date:  03/31/2020   Primary MD  [name], MD  Recommendations for primary care physician for things to follow:  -Please check CBC, CMP during next visit. -Patient to follow with pulmonary as an outpatient, please see appointment below -Patient on Eliquis 2.5 mg p.o. twice daily DVT prophylaxis dose for elevated D-dimers of 8 for next 15 days, meanwhile to hold aspirin, aspirin can be resumed after finishing 15 days of Eliquis. -Metoprolol has been stopped given for second pause  Admission Diagnosis  S/P thoracentesis [Z98.890] Loculated pleural effusion [J90] Non-recurrent bilateral inguinal hernia without obstruction or gangrene [K40.20] COVID-19 [U07.1]   Discharge Diagnosis  S/P thoracentesis [Z98.890] Loculated pleural effusion [J90] Non-recurrent bilateral inguinal hernia without  obstruction or gangrene [K40.20] COVID-19 [U07.1]    Active Problems:   COVID-19   Empyema ([place])   Chest tube in place     History of present illness and  Hospital Course:     Kindly see H&P for history of present illness and admission details, please review complete Labs, Consult reports and Test reports for all details in brief  HPI  from the history and physical done on the day of admission 03/21/2020  KGY:JEHUD B Shirleyis a 79 y.o.malewith medical history significant oflung CA status post partial resection, hypertension, HLD, GERD, CAD with MI remote, presented with worsening back pain and cough. Patient did eat grass cutting 2 days ago, and he said that usually after doing grass cutting cream we will have back pain for a day or 2. But this time the back pain did not go away after day 2, and he also developed a dry cough and exertional shortness of breath. Denies any chest pains, no fever or chills. No loss of taste or diarrhea. ED Course:COVID-19 positive,CT angiogram negative for PE but some loculated right sided pleural effusion. Potassium 3.0, creatinine 1.2. Patient was found to be very tachypneic and symptomatic when talking and minimal exertion. Tachycardia.   Hospital Course   Incidental COVID-19 Infection in a patient is fully vaccinated. - Although his CRP is elevated he has no hypoxia or oxygen demand at rest, CRP most likely elevated in the setting of empyema . -has finished his short course of steroids and Remdesivir and this problem seems to have resolved. -Patient received monoclonal antibody 9/22 after discussing with PCCM  Elevated D-dimers -D-dimers elevated during hospital stay, he was kept on Lovenox 0.5 mg/kg every 12 hours,his CTA  chest on admission negative for PE, as well venous Doppler ultrasound was negative for DVT on admission, continues to have D-dimers trending up, it is 8.36 on discharge, patient is high risk for developing VTE,  so he will be discharged on Eliquis DVT prophylaxis dose 2.5 mg p.o. twice daily for next 15 days, he was instructed to hold aspirin while he is on Eliquis and to resume aspirin after he is finishing his 15 days on Eliquis.   Empyema due to CAP/aspiration  -Status post thoracentesis 9/17 . - underwent chest tube placement on the right side on 03/25/2020, chest tube was discontinued 9/24 even improvement on repeat CT chest obtained 9/23. - pleural fluid growing Streptococcus intermedius. On IV Rocephin, currently on cefazolin, antibiotics management per PCCM.Need total of 4 weeks of antibiotic treatment, can be discharged on cephalexin for 3 more weeks. -Will require outpatient pulmonary follow-up as well, which has been arranged by PCCM  History of Metastatic nonsmall cell lung cancer, adenocarcinoma Right Lung cancer in the past ( 15 yrs ago, Dr Inda Merlin) With status post neoadjuvant chemotherapy followed by right middle lobectomy with lymph node dissection . -Cytology from thoracentesis 9/17 with no malignant cells identified. -Follow-up with PCCM as an outpatient  Essential hypertension.  - On Norvasc, and a 3-second asymptomatic pause on telemetry on 03/27/2020 early morning, beta-blocker stopped.   History of CAD. Chest pain-free no acute issues, beta-blockers has been stopped given 3-second asymptomatic pause on telemetry, aspirin will be held while he is on Eliquis, can be resumed after finishing Eliquis, continue with statin .   Right-sided parapneumonic effusion/empyema questionsilent aspiration. Cleared by speech continue with supportive care as in #2 above.  3 second asymptomatic sinus pause on telemetry. Discontinued beta-blocker,   Last consulted note for PCCM while patient was hospitalized was on 03/30/2020 by Dr. Chase Caller.  That assessment and plan as listed below:   Assessment & Plan:  Empyema due to CAP/aspiration: Strep intermedius on pleural fluid  culture from thora 9/17. [place] with hyperechoic complicated loculated effusion. Chest tube placed for empyema 9/19  -March 30, 2020: Significantly improved.  Chest tube drainage is between 50 and 100 cc/day.  This is for the last 48 hours.  He is nontoxic.  CT chest shows significant improvement.  His baseline lung function is that he does a lot of yard work but is otherwise sedentary.  Pleural fluid cytology is negative for malignant cells  Plan -Change IV antibiotic to oral cephalexin x3 more weeks for a total of 4 weeks.  Ideal duration might be 6 weeks and this can be decided as an outpatient -He will need a follow-up CT scan of the chest in few to several months -Remove chest tube today -> reassess clinically today or tomorrow before discharge according to the hospitalist -Do ambulatory pulse ox on room air before discharge -Follow-up with pulmonary as below  Hypokalemia less than 4 mEq  Plan -Replete potassium   RML adenocarcinoma s/p resection/chemo 2010 Repeat CT scan probably required in the next few to several months -he has an appointment Dr. Julien Nordmann in January 2022 and the follow-up CT scan can be done at this time  COVID 19  Per primary   Patient presenting to office today. He is reporting that he has been doing better since being discharged from the hospital. He has completed his 14 days of Eliquis from the hospitalist team. He has also completed his 3 weeks of cephalexin from hospital discharge. He started to regain his appetite.  He has not had any fevers. He has noticed that he started developed some increase right lower lobe chest wall pain when laying flat. He has not noticed any changes in overall dyspnea. He feels that this is also slowly starting to improve.    Questionaires / Pulmonary Flowsheets:   ACT:  No flowsheet data found.  MMRC: mMRC Dyspnea Scale mMRC Score  04/23/2020 1    Epworth:  No flowsheet data found.  Tests:   Procedures:   9/18 Thoracentesis> 220 cc cloudy fluid removed, right pleural space 9/19 Chest tube placed (right) >>> 9/24 planed  Significant Diagnostic Tests:  CT Angio Chest 9/17 >>loculated pleural effusion in R base, very mild centrilobular emphysema in upper lobes bilaterally, Compressive atelectasis vs consolidation R base, thickening of airways in R base  03/29/2020-CT chest without contrast-low-density nodular process along right major fissure similar to prior study and new since 07/29/2019, low density suggests that this is loculated fluid, continue close attention recommended, posterior right pleural drain with interval decrease in right pleural space and residual fluid/debris/gas in the posterior right pleural space, residual collapse and consolidative opacity in the posterior right lower lobe, new subsegmental atelectasis in the left lower lobe,  Micro Data:  COVID 9/16 >>positive  Pleural culture 9/17: strep intermedius  Antimicrobials:  CTX 9/19 >>  Azithro 9/19 >> 9/20 /remdesivir 9/19>> Regeneron monoclonal antibody September 22 2021x1 dose   FENO:  No results found for: NITRICOXIDE  PFT: No flowsheet data found.  WALK:  No flowsheet data found.  Imaging: DG Chest 1 View  Result Date: 03/25/2020 CLINICAL DATA:  Chest tube in place EXAM: CHEST  1 VIEW COMPARISON:  March 23, 2020 FINDINGS: A pigtail right chest tube is been placed. The right-sided pleural effusion is smaller although effusion and underlying opacity does persist. Postoperative changes in the right base. No pneumothorax. The left lung is clear. The cardiomediastinal silhouette is stable. IMPRESSION: 1. A chest tube is been placed on the right. The right-sided pleural effusion persists but is smaller. Electronically Signed   By: Dorise Bullion III M.D   On: 03/25/2020 19:50   DG Chest 2 View  Result Date: 04/23/2020 CLINICAL DATA:  Empyema EXAM: CHEST - 2 VIEW COMPARISON:  03/28/2020 FINDINGS: Since the  prior examination, the right chest tube has been removed and a moderate posteriorly loculated pleural effusion has redeveloped. Partial right lung resection with right 630 poor thoracotomy and resultant right-sided volume loss is again noted. No superimposed confluent pulmonary infiltrate. Left lung is clear. No pneumothorax or pleural effusion on the left. Cardiac size within normal limits. Pulmonary vascularity is normal. No acute bone abnormality. Degenerative changes are seen within the thoracic spine. IMPRESSION: Interval removal of right chest tube with re-accumulation of a moderate posteriorly loculated right pneumothorax. Stable right-sided volume loss secondary to partial right lung resection. Electronically Signed   By: Fidela Salisbury MD   On: 04/23/2020 10:12   CT CHEST WO CONTRAST  Result Date: 03/29/2020 CLINICAL DATA:  Precarinal lymphadenopathy on prior study. History of lung nodule. EXAM: CT CHEST WITHOUT CONTRAST TECHNIQUE: Multidetector CT imaging of the chest was performed following the standard protocol without IV contrast. COMPARISON:  03/22/2020 FINDINGS: Cardiovascular: The heart size is normal. No substantial pericardial effusion. Coronary artery calcification is evident. Atherosclerotic calcification is noted in the wall of the thoracic aorta. Mediastinum/Nodes: 13 mm short axis precarinal node is unchanged. No other mediastinal lymphadenopathy evident. No evidence for gross hilar lymphadenopathy although assessment is limited by  the lack of intravenous contrast on today's study. The esophagus has normal imaging features. There is no axillary lymphadenopathy. Lungs/Pleura: The low-density nodular process along the right major fissure (69/6) is similar to prior study and new since 07/29/2019. Low-density suggests that this may be loculated fluid. Right parahilar staple line again noted. Posterior pleural drain is identified with pigtail loop formed posteromedially. There is residual  fluid/debris/gas in the posterior right pleural space. Residual collapse/consolidative opacity noted in the posterior right lower lobe. Subsegmental atelectasis in the left lower lobe is new in the interval. No left effusion. Upper Abdomen: Tiny calcified gallstone evident. Musculoskeletal: No worrisome lytic or sclerotic osseous abnormality. IMPRESSION: 1. The low-density nodular process along the right major fissure is similar to prior study and new since 07/29/2019. Low-density suggests that this is loculated fluid. Continued close attention recommended. 2. Posterior right pleural drain with interval decrease in right pleural fluid and residual fluid/debris/gas in the posterior right pleural space. 3. Residual collapse/consolidative opacity in the posterior right lower lobe. 4. New subsegmental atelectasis in the left lower lobe. 5. Cholelithiasis. 6. Aortic Atherosclerosis (ICD10-I70.0). Electronically Signed   By: Misty Stanley M.D.   On: 03/29/2020 10:05   DG Chest Port 1 View  Result Date: 03/28/2020 CLINICAL DATA:  Empyema EXAM: PORTABLE CHEST 1 VIEW COMPARISON:  03/27/2020 FINDINGS: Right basilar chest tube remains in place. No visible pneumothorax. Postoperative changes on the right. Right basilar atelectasis. Left lung clear. Heart is normal size. IMPRESSION: Postoperative changes on the right with right base atelectasis. No visible pneumothorax. Electronically Signed   By: Rolm Baptise M.D.   On: 03/28/2020 08:41   DG Chest Port 1 View  Result Date: 03/27/2020 CLINICAL DATA:  Empyema.  Chest tube.  COVID. EXAM: PORTABLE CHEST 1 VIEW COMPARISON:  03/26/2020. FINDINGS: Right chest tube in stable position. No pneumothorax. Mediastinum hilar structures normal. Persistent right base atelectasis/infiltrate. Persistent small right pleural effusion. Small amount of fluid may be in the right major fissure. Interim improvement in aeration in the left lung base. Stable deformity of the right posterior  seventh rib. Degenerative changes scoliosis thoracic spine. IMPRESSION: 1.  Right chest tube in stable position.  No pneumothorax. 2. Persistent right base atelectasis/infiltrate. Persistent small right pleural effusion. Small amount of fluid may be present in the right major fissure. Similar findings noted on prior exam. Interim improvement in aeration in the left lung base. Electronically Signed   By: Marcello Moores  Register   On: 03/27/2020 06:27   DG Chest Port 1 View  Result Date: 03/26/2020 CLINICAL DATA:  Reason for exam: chest tube in place EXAM: PORTABLE CHEST - 1 VIEW COMPARISON:  the previous day's study FINDINGS: Stable pigtail chest tube at the right lung base. No pneumothorax. Patchy atelectasis/consolidation at the lung bases right greater than left. Staple lines in the right mid lung. Heart size normal.  Aortic Atherosclerosis (ICD10-170.0). There may be a small loculated right pleural effusion laterally at the base. Previous right thoracotomy. IMPRESSION: Stable right chest tube with no pneumothorax. Electronically Signed   By: Lucrezia Europe M.D.   On: 03/26/2020 07:03   VAS [place] LOWER EXTREMITY VENOUS (DVT)  Result Date: 04/01/2020  Lower Venous DVT Study Indications: Covid-19, D-Dimer.  Comparison Study: Prior study from 03/23/20 is available for comparison Performing Technologist: Sharion Dove RVS  Examination Guidelines: A complete evaluation includes B-mode imaging, spectral Doppler, color Doppler, and power Doppler as needed of all accessible portions of each vessel. Bilateral testing is considered an integral  part of a complete examination. Limited examinations for reoccurring indications may be performed as noted. The reflux portion of the exam is performed with the patient in reverse Trendelenburg.  +---------+---------------+---------+-----------+----------+--------------+ RIGHT    CompressibilityPhasicitySpontaneityPropertiesThrombus Aging  +---------+---------------+---------+-----------+----------+--------------+ CFV      Full           Yes      Yes                                 +---------+---------------+---------+-----------+----------+--------------+ SFJ      Full                                                        +---------+---------------+---------+-----------+----------+--------------+ FV Prox  Full                                                        +---------+---------------+---------+-----------+----------+--------------+ FV Mid   Full                                                        +---------+---------------+---------+-----------+----------+--------------+ FV DistalFull                                                        +---------+---------------+---------+-----------+----------+--------------+ PFV      Full                                                        +---------+---------------+---------+-----------+----------+--------------+ POP      Full           Yes      Yes                                 +---------+---------------+---------+-----------+----------+--------------+ PTV      Full                                                        +---------+---------------+---------+-----------+----------+--------------+ PERO     Full                                                        +---------+---------------+---------+-----------+----------+--------------+   +---------+---------------+---------+-----------+----------+--------------+ LEFT     CompressibilityPhasicitySpontaneityPropertiesThrombus Aging +---------+---------------+---------+-----------+----------+--------------+ CFV  Full           Yes      Yes                                 +---------+---------------+---------+-----------+----------+--------------+ SFJ      Full                                                         +---------+---------------+---------+-----------+----------+--------------+ FV Prox  Full                                                        +---------+---------------+---------+-----------+----------+--------------+ FV Mid   Full                                                        +---------+---------------+---------+-----------+----------+--------------+ FV DistalFull                                                        +---------+---------------+---------+-----------+----------+--------------+ PFV      Full                                                        +---------+---------------+---------+-----------+----------+--------------+ POP      Full           Yes      Yes                                 +---------+---------------+---------+-----------+----------+--------------+ PTV      Full                                                        +---------+---------------+---------+-----------+----------+--------------+ PERO     Full                                                        +---------+---------------+---------+-----------+----------+--------------+     Summary: BILATERAL: - No evidence of deep vein thrombosis seen in the lower extremities, bilaterally. -   *See table(s) above for measurements and observations. Electronically signed by Deitra Mayo MD on 04/01/2020 at 7:14:09 AM.    Final     Lab Results:  CBC    Component Value Date/Time   WBC  8.7 03/31/2020 0301   RBC 3.98 (L) 03/31/2020 0301   HGB 11.1 (L) 03/31/2020 0301   HGB 13.9 07/29/2019 1139   HGB 14.1 01/16/2017 0753   HCT 34.6 (L) 03/31/2020 0301   HCT 42.7 01/16/2017 0753   PLT 356 03/31/2020 0301   PLT 198 07/29/2019 1139   PLT 168 01/16/2017 0753   MCV 86.9 03/31/2020 0301   MCV 87.9 01/16/2017 0753   MCH 27.9 03/31/2020 0301   MCHC 32.1 03/31/2020 0301   RDW 13.8 03/31/2020 0301   RDW 13.7 01/16/2017 0753   LYMPHSABS 1.2 03/31/2020 0301    LYMPHSABS 1.5 01/16/2017 0753   MONOABS 1.1 (H) 03/31/2020 0301   MONOABS 0.7 01/16/2017 0753   EOSABS 0.1 03/31/2020 0301   EOSABS 0.1 01/16/2017 0753   BASOSABS 0.0 03/31/2020 0301   BASOSABS 0.0 01/16/2017 0753    BMET    Component Value Date/Time   NA 131 (L) 03/31/2020 0301   NA 141 01/16/2017 0753   K 4.2 03/31/2020 0301   K 4.2 01/16/2017 0753   CL 98 03/31/2020 0301   CL 105 07/13/2012 0848   CO2 26 03/31/2020 0301   CO2 27 01/16/2017 0753   GLUCOSE 110 (H) 03/31/2020 0301   GLUCOSE 110 01/16/2017 0753   GLUCOSE 133 (H) 07/13/2012 0848   BUN 13 03/31/2020 0301   BUN 16.6 01/16/2017 0753   CREATININE 0.94 03/31/2020 0301   CREATININE 1.15 07/29/2019 1139   CREATININE 1.1 01/16/2017 0753   CALCIUM 8.7 (L) 03/31/2020 0301   CALCIUM 9.9 01/16/2017 0753   GFRNONAA >60 03/31/2020 0301   GFRNONAA >60 07/29/2019 1139   GFRAA >60 03/31/2020 0301   GFRAA >60 07/29/2019 1139    BNP    Component Value Date/Time   BNP 70.6 03/30/2020 1054    ProBNP No results found for: PROBNP  Specialty Problems      Pulmonary Problems   C O P D    Qualifier: Diagnosis of  By: Elsworth Soho MD, Leanna Sato        Primary cancer of right middle lobe of lung ([place])    Qualifier: Diagnosis of  By: Elsworth Soho MD, Leanna Sato        Loculated pleural effusion      Allergies  Allergen Reactions  . Lipitor [Atorvastatin Calcium]     Knee pain   . Atorvastatin Other (See Comments)    Joint/knee pain    Immunization History  Administered Date(s) Administered  . Influenza, High Dose Seasonal PF 04/16/2017, 03/15/2018    Past Medical History:  Diagnosis Date  . Asthma   . CAD (coronary artery disease) 1999   stents to RCA, residual 40% Lt main, decreased EF40%   . Cataract    bilateral-beginning stages  . Colon polyps   . GERD (gastroesophageal reflux disease)   . H/O cardiovascular stress test 1999/2005/2008/2011   last with inf scar/infarct, EF 54%  . H/O echocardiogram 2008    EF 50-55%, Mild MR, Mild TR,    . Hemorrhoids   . 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 Hyperlipemia   . Hypertension   . lung ca dx'd 10/2008   chemo comp 01/2009-stage 4 lung cancer  . Myocardial infarction ([place]) 02-18-1998  . Peptic stricture of esophagus   . Right carotid bruit 04/01/12   minimal carotid stenosis on doppler    Tobacco History: Social History   Tobacco Use  Smoking Status Former Smoker  . Packs/day: 2.00  .  Types: Cigarettes  . Quit date: 07/16/1997  . Years since quitting: 22.7  Smokeless Tobacco Never Used   Counseling given: Not Answered   Continue to not smoke  Outpatient Encounter Medications as of 04/23/2020  Medication Sig  . amLODipine (NORVASC) 5 MG tablet Take 1 tablet by mouth daily.  Marland Kitchen apixaban (ELIQUIS) 2.5 MG TABS tablet Take 1 tablet (2.5 mg total) by mouth 2 (two) times daily for 15 days.  Marland Kitchen aspirin 325 MG EC tablet Take 1 tablet (325 mg total) by mouth daily. Please hold for 15 days when you are on Eliquis, resume on 10/11 once you are off Eliquis.  Marland Kitchen ergocalciferol (VITAMIN D2) 1.25 MG (50000 UT) capsule Take 50,000 Units by mouth once a week.  . nitroGLYCERIN (NITROSTAT) 0.4 MG SL tablet Place 1 tablet (0.4 mg total) under the tongue every 5 (five) minutes as needed for chest pain.  Marland Kitchen oxybutynin (DITROPAN) 5 MG tablet Take 5 mg by mouth every 8 (eight) hours as needed for bladder spasms.   . pantoprazole (PROTONIX) 40 MG tablet Take 1 tablet (40 mg total) by mouth daily.  Marland Kitchen saccharomyces boulardii (FLORASTOR) 250 MG capsule Take 1 capsule (250 mg total) by mouth 2 (two) times daily.  . simvastatin (ZOCOR) 40 MG tablet Take 1 tablet (40 mg total) by mouth daily at 6 PM.   Facility-Administered Encounter Medications as of 04/23/2020  Medication  . 0.9 %  sodium chloride infusion     Review of Systems  Review of Systems  Constitutional: Positive for fatigue. Negative for activity change, chills, fever and  unexpected weight change.  HENT: Negative for postnasal drip, rhinorrhea, sinus pressure, sinus pain and sore throat.   Eyes: Negative.   Respiratory: Positive for cough and shortness of breath. Negative for wheezing.        Laying down right lung pain for 3d  Cardiovascular: Negative for chest pain and palpitations.  Gastrointestinal: Negative for constipation, diarrhea, nausea and vomiting.  Endocrine: Negative.   Genitourinary: Negative.   Musculoskeletal: Negative.   Skin: Negative.   Neurological: Negative for dizziness and headaches.  Psychiatric/Behavioral: Negative.  Negative for dysphoric mood. The patient is not nervous/anxious.   All other systems reviewed and are negative.    Physical Exam  BP (!) 100/50 (BP Location: Left Arm, Patient Position: Sitting, Cuff Size: Normal)   Pulse 62   Ht 5\' 10"  (1.778 m)   Wt 154 lb 12.8 oz (70.2 kg)   SpO2 98%   BMI 22.21 kg/m   Wt Readings from Last 5 Encounters:  04/23/20 154 lb 12.8 oz (70.2 kg)  03/28/20 168 lb 6.9 oz (76.4 kg)  09/02/19 174 lb (78.9 kg)  08/01/19 174 lb 11.2 oz (79.2 kg)  09/01/18 174 lb 12.8 oz (79.3 kg)    BMI Readings from Last 5 Encounters:  04/23/20 22.21 kg/m  03/28/20 24.17 kg/m  09/02/19 24.97 kg/m  08/01/19 25.07 kg/m  09/01/18 25.08 kg/m     Physical Exam Vitals and nursing note reviewed.  Constitutional:      General: He is not in acute distress.    Appearance: Normal appearance. He is normal weight.  HENT:     Head: Normocephalic and atraumatic.     Right Ear: Hearing and external ear normal.     Left Ear: Hearing and external ear normal.     Ears:     Comments: Hearing aids bilaterally    Nose: Rhinorrhea present. No mucosal edema.     Right  Turbinates: Not enlarged.     Left Turbinates: Not enlarged.     Mouth/Throat:     Mouth: Mucous membranes are dry.     Pharynx: Oropharynx is clear. No oropharyngeal exudate.  Eyes:     Pupils: Pupils are equal, round, and reactive  to light.  Cardiovascular:     Rate and Rhythm: Normal rate and regular rhythm.     Pulses: Normal pulses.     Heart sounds: Normal heart sounds. No murmur heard.   Pulmonary:     Effort: Pulmonary effort is normal.     Breath sounds: Examination of the right-middle field reveals decreased breath sounds. Examination of the right-lower field reveals decreased breath sounds. Decreased breath sounds present. No wheezing or rales.  Abdominal:     General: Bowel sounds are normal. There is no distension.     Palpations: Abdomen is soft.     Tenderness: There is no abdominal tenderness.  Musculoskeletal:     Cervical back: Normal range of motion.     Right lower leg: No edema.     Left lower leg: No edema.  Lymphadenopathy:     Cervical: No cervical adenopathy.  Skin:    General: Skin is warm and dry.     Capillary Refill: Capillary refill takes less than 2 seconds.     Findings: No erythema or rash.  Neurological:     General: No focal deficit present.     Mental Status: He is alert and oriented to person, place, and time.     Motor: No weakness.     Coordination: Coordination normal.     Gait: Gait is intact. Gait normal.  Psychiatric:        Mood and Affect: Mood normal.        Behavior: Behavior normal. Behavior is cooperative.        Thought Content: Thought content normal.        Judgment: Judgment normal.       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 Erin Fulling  History of COVID-19 September/2021 COVID-19 positive  Plan: Chest  x-ray today We will repeat chest x-ray in 4 weeks with appointment with Dr. Erin Fulling   Empyema [facility]) September/2021 empyema Pleural culture grew strep intermedius Treated with IV Rocephin as well as 3 weeks of cephalexin Repeat chest x-ray today shows partial reaccumulation right lower lobe Discussed case with Dr. Erin Fulling. May need to consider if patient symptoms worsen readmission to the hospital for pigtail catheter placement.  With patient's stable clinical status as well as no fevers, returning to clinical baseline. We will continue to keep a close monitor on patient symptoms. We will bring patient back in to our office in 2 to 4 weeks with a chest x-ray to monitor. As well so he can establish care with Dr. Erin Fulling.  Plan: Follow-up in 2 to 4 weeks with chest x-ray Continue swallow precautions If symptoms worsen such as fever, increased sputum production, loss of appetite, increased shortness of breath patient to contact her office for in person physical evaluation as well as potential hospital admission  Return in about 4 weeks (around 05/21/2020), or if symptoms worsen or fail to improve, for 30 MINUTE SLOT, Follow up with Dr. Silas Flood, Follow up with Dr. Erin Fulling.   Lauraine Rinne, NP 04/23/2020   This appointment required 65 minutes of patient care (this includes precharting, chart review, review of results, face-to-face care, etc.).

## 2020-04-23 NOTE — Assessment & Plan Note (Signed)
Plan:  Keep follow up with oncology 

## 2020-04-23 NOTE — Assessment & Plan Note (Addendum)
September/2021 empyema Pleural culture grew strep intermedius Treated with IV Rocephin as well as 3 weeks of cephalexin Repeat chest x-ray today shows partial reaccumulation right lower lobe Discussed case with Dr. Erin Fulling. May need to consider if patient symptoms worsen readmission to the hospital for pigtail catheter placement.  With patient's stable clinical status as well as no fevers, returning to clinical baseline. We will continue to keep a close monitor on patient symptoms. We will bring patient back in to our office in 2 to 4 weeks with a chest x-ray to monitor. As well so he can establish care with Dr. Erin Fulling.  Plan: Follow-up in 2 to 4 weeks with chest x-ray Continue swallow precautions If symptoms worsen such as fever, increased sputum production, loss of appetite, increased shortness of breath patient to contact her office for in person physical evaluation as well as potential hospital admission

## 2020-04-23 NOTE — Assessment & Plan Note (Signed)
Recent hospitalization in September/2021 Received IV Rocephin Also received 3 weeks of cephalexin at discharge Patient feels clinical improvement Right lower lobe has diminished breath sounds on exam today Patient reporting 3 days of right lower lobe chest wall pain  Plan: Chest x-ray today Close clinical follow-up with appointment with Dr. Erin Fulling

## 2020-04-23 NOTE — Patient Instructions (Addendum)
You were seen today by Lauraine Rinne, NP  For:   1. Loculated pleural effusion 2. History of COVID-19 3. Empyema ([employer])  - DG Chest 2 View; Future  We will bring him back in close follow-up with a repeat chest x-ray in 2 to 4 weeks to monitor this loculated pleural effusion.  If you start having worsened symptoms such as increased fatigue, increased shortness of breath, fevers, weight loss please contact our office immediately  We recommend today:  Orders Placed This Encounter  Procedures  . DG Chest 2 View    Standing Status:   Future    Number of Occurrences:   1    Standing Expiration Date:   04/23/2021    Order Specific Question:   Reason for Exam (SYMPTOM  OR DIAGNOSIS REQUIRED)    Answer:   post covid, PE    Order Specific Question:   Preferred imaging location?    Answer:   Internal    Order Specific Question:   Radiology Contrast Protocol - do NOT remove file path    Answer:   [email or web address]  . DG Chest 2 View    Standing Status:   Future    Standing Expiration Date:   08/24/2020    Order Specific Question:   Reason for Exam (SYMPTOM  OR DIAGNOSIS REQUIRED)    Answer:   recent chest tube, recent empyema, pleural effusion, 3d right lung pain    Order Specific Question:   Preferred imaging location?    Answer:   Internal    Order Specific Question:   Radiology Contrast Protocol - do NOT remove file path    Answer:   [email or web address]   Orders Placed This Encounter  Procedures  . DG Chest 2 View  . DG Chest 2 View   No orders of the defined types were placed in this encounter.   Follow Up:    Return in about 4 weeks (around 05/21/2020), or if symptoms worsen or fail to improve, for 30 MINUTE SLOT, Follow up with Dr. Silas Flood, Follow up with Dr. Erin Fulling. Schedule with either Dr. Erin Fulling or Dr. Silas Flood in a 45min slot with chest xray before     Notification  of test results are managed in the following manner: If there are  any recommendations or changes to the  plan of care discussed in office today,  we will contact you and let you know what they are. If you do not hear from [place], then your results are normal and you can view them through your  MyChart account , or a letter will be sent to you. Thank you again for trusting [place] with your care  - Thank you, [facility]    It is flu season:   >>> Best ways to protect herself from the flu: Receive the yearly flu vaccine, practice good hand hygiene washing with soap and also using hand sanitizer when available, eat a nutritious meals, get adequate rest, hydrate appropriately       Please contact the office if your symptoms worsen or you have concerns that you are not improving.   Thank you for choosing [facility] for your healthcare, and for allowing [place] to partner with you on your healthcare journey. I am thankful to be able to provide care to you today.   Wyn Quaker FNP-C

## 2020-04-23 NOTE — Assessment & Plan Note (Signed)
September/2021 COVID-19 positive  Plan: Chest x-ray today We will repeat chest x-ray in 4 weeks with appointment with Dr. Erin Fulling

## 2020-04-30 ENCOUNTER — Telehealth: Payer: Self-pay | Admitting: Pulmonary Disease

## 2020-04-30 NOTE — Telephone Encounter (Signed)
I would recommend the patient be seen in our office.  Needs to establish with Dr. Erin Fulling.  See if Dr. Erin Fulling has any availability to see the patient sooner.  Wyn Quaker, FNP

## 2020-04-30 NOTE — Telephone Encounter (Signed)
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 Erin Hall  History of COVID-19 September/2021 COVID-19 positive  Plan: Chest x-ray today We will repeat chest x-ray in 4 weeks with appointment with Dr. Erin Hall   Empyema [facility]) September/2021 empyema Pleural culture grew strep intermedius Treated with IV Rocephin as well as 3 weeks of cephalexin Repeat chest x-ray today shows partial reaccumulation right lower lobe Discussed case with Dr. Erin Hall. May need to consider if patient symptoms worsen readmission to the hospital for pigtail catheter placement.  With patient's stable clinical status as well as no fevers, returning to clinical baseline. We will continue to keep a close monitor on patient symptoms. We will bring patient back in to our office in 2 to 4 weeks with a chest x-ray to monitor. As well so he can establish care with Dr. Erin Hall.  Plan: Follow-up in 2 to 4 weeks with chest x-ray Continue swallow precautions If symptoms worsen such as fever, increased sputum production, loss of appetite, increased shortness of breath patient to contact her office for in person physical evaluation as well as potential hospital admission     Return in about 4 weeks (around 05/21/2020), or if symptoms worsen or  fail to improve, for 30 MINUTE SLOT, Follow up with Dr. Silas Flood, Follow up with Dr. Erin Hall.  Was appointment offered to patient (explain)?  Pt wants recommendations   Reason for call: Called and spoke with pt who stated he developed a knot on right side of back where thoracentesis was performed while in hospital. Pt states the knot is not as big as a baseball but close to that in size. Pt said if he leans back on the area, it is sore.  Asked pt if the area is warm to the touch and he states that it is not. Pt states that it feels the same in temperature as the rest of his skin.  Asked pt if he has been running any temp and he denies any fever. Had pt take his temp while I had him on the phone and he stated that his temp was 97.1.  Pt denies any trouble breathing.  Asked pt if he was still on Eliquis and he stated that he was not.  Pt wants to know what could be recommended due to the area that has come up.  M[facility]w [facility], please advise.   Allergies  Allergen Reactions  . Lipitor [Atorvastatin Calcium]     Knee pain   . Atorvastatin Other (See Comments)    Joint/knee pain    Immunization History  Administered Date(s) Administered  . Influenza, High Dose Seasonal PF 04/16/2017, 03/15/2018

## 2020-04-30 NOTE — Telephone Encounter (Signed)
Yes, that is fine if he is added to my schedule on 11/1.  Thanks, Wille Glaser

## 2020-04-30 NOTE — Telephone Encounter (Signed)
Called and spoke with pt's wife and have changed pt's appt with Dr. Erin Fulling 11/1. Nothing further needed.

## 2020-04-30 NOTE — Telephone Encounter (Signed)
Dr. Erin Fulling, please advise if you would be okay with [place] moving pt's appt up from 11/9 to 11/1 with you and if so, what time.

## 2020-04-30 NOTE — Telephone Encounter (Signed)
Patient calling- states he saw [facility] last week and they're "watching his right lung." When he was in the hospital that was where they drained fluid. Pt states he woke up yesterday with a "knot" about half the size of a baseball, states incision is where the knot is. Please advise. [phone]

## 2020-05-07 ENCOUNTER — Ambulatory Visit (INDEPENDENT_AMBULATORY_CARE_PROVIDER_SITE_OTHER): Payer: Medicare Other

## 2020-05-07 ENCOUNTER — Other Ambulatory Visit: Payer: Self-pay

## 2020-05-07 ENCOUNTER — Encounter: Payer: Self-pay | Admitting: Pulmonary Disease

## 2020-05-07 ENCOUNTER — Ambulatory Visit: Payer: Medicare Other | Admitting: Pulmonary Disease

## 2020-05-07 VITALS — BP 136/68 | HR 68 | Temp 97.8°F | Ht 68.0 in | Wt 155.2 lb

## 2020-05-07 DIAGNOSIS — J869 Pyothorax without fistula: Secondary | ICD-10-CM | POA: Diagnosis not present

## 2020-05-07 DIAGNOSIS — J90 Pleural effusion, not elsewhere classified: Secondary | ICD-10-CM | POA: Diagnosis not present

## 2020-05-07 DIAGNOSIS — J189 Pneumonia, unspecified organism: Secondary | ICD-10-CM

## 2020-05-07 HISTORY — DX: Pneumonia, unspecified organism: J18.9

## 2020-05-07 NOTE — Progress Notes (Signed)
Synopsis: Returns to pulmonary clinic after hospitalization for empyema  Subjective:   PATIENT ID: Mathew Hall GENDER: male DOB: 09/13/1940, MRN: [id number]   HPI  Chief Complaint  Patient presents with  . Follow-up    Breathing is overall doing well. He states that he has developed a "lump"- painful right side back- noticed about 2 wks ago.     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 Pleural fluid culture grew Streptococcus intermedius. A 60fr pigtail catheter was placed with continued drainage and no TPA/DNAse given. A CT chest scan was performed on 03/29/20 which showed interval decrease in the right pleural effusion with residual fluid/debris/gas in the posterior right pleural space. The pigtail catheter was then pulled on 9/24 with reported minimal drainage. He was treated with IV rocephin while in patient and discharged on 3 weeks of cephalexin.  He followed up in pulmonary clinic on 10/18 where he reported clinical improvement but was experiencing chest wall pain where the chest tube was placed. A chest radiograph was performed on 10/18 noting a moderate posteriorly loculated pleural effusion had redeveloped. Patient called the clinic on 10/25 with complaints of a "knot" half the size of a baseball at the location of the chest tube. Follow up was then made for today in clinic.   He overall reports feeling better since discharge. He denies fevers, chills or sweats. He has regained most of his appetite. Denies any fatigue, lethargy or weight loss.        Past Medical History:  Diagnosis Date  . Asthma   . CAD (coronary artery disease) 1999   stents to RCA, residual 40% Lt main, decreased EF40%   . Cataract    bilateral-beginning stages  . Colon polyps   . GERD  (gastroesophageal reflux disease)   . H/O cardiovascular stress test 1999/2005/2008/2011   last with inf scar/infarct, EF 54%  . H/O echocardiogram 2008   EF 50-55%, Mild MR, Mild TR,    . Hemorrhoids   . 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 Hyperlipemia   . Hypertension   . lung ca dx'd 10/2008   chemo comp 01/2009-stage 4 lung cancer  . Myocardial infarction ([place]) 02-18-1998  . Peptic stricture of esophagus   . Right carotid bruit 04/01/12   minimal carotid stenosis on doppler     Family History  Problem Relation Age of Onset  . Healthy Sister   . Healthy Sister   . Stroke Mother   . Colon cancer Neg Hx   . Rectal cancer Neg Hx   . Stomach cancer Neg Hx      Social History   Socioeconomic History  . Marital status: Married    Spouse name: Not on file  . Number of children: 2  . Years of education: Not on file  . Highest education level: Not on file  Occupational History  . Occupation: retired  Tobacco Use  . Smoking status: Former Smoker    Packs/day: 2.00    Types: Cigarettes    Quit date: 07/16/1997    Years since quitting: 22.8  . Smokeless tobacco: Never Used  Vaping Use  . Vaping Use: Never used  Substance and Sexual Activity  . Alcohol use: Yes    Comment: occasionally  .  Drug use: No  . Sexual activity: Not on file  Other Topics Concern  . Not on file  Social History Narrative  . Not on file   Social Determinants of Health   Financial Resource Strain:   . Difficulty of Paying Living Expenses: Not on file  Food Insecurity:   . Worried About Charity fundraiser in the Last Year: Not on file  . Ran Out of Food in the Last Year: Not on file  Transportation Needs:   . Lack of Transportation (Medical): Not on file  . Lack of Transportation (Non-Medical): Not on file  Physical Activity:   . Days of Exercise per Week: Not on file  . Minutes of Exercise per Session: Not on file  Stress:   . Feeling of Stress : Not on file    Social Connections:   . Frequency of Communication with Friends and Family: Not on file  . Frequency of Social Gatherings with Friends and Family: Not on file  . Attends Religious Services: Not on file  . Active Member of Clubs or Organizations: Not on file  . Attends Archivist Meetings: Not on file  . Marital Status: Not on file  Intimate Partner Violence:   . Fear of Current or Ex-Partner: Not on file  . Emotionally Abused: Not on file  . Physically Abused: Not on file  . Sexually Abused: Not on file     Allergies  Allergen Reactions  . Lipitor [Atorvastatin Calcium]     Knee pain   . Atorvastatin Other (See Comments)    Joint/knee pain     Outpatient Medications Prior to Visit  Medication Sig Dispense Refill  . amLODipine (NORVASC) 5 MG tablet Take 1 tablet by mouth daily.    Marland Kitchen aspirin 325 MG EC tablet Take 1 tablet (325 mg total) by mouth daily. Please hold for 15 days when you are on Eliquis, resume on 10/11 once you are off Eliquis.    Marland Kitchen ergocalciferol (VITAMIN D2) 1.25 MG (50000 UT) capsule Take 50,000 Units by mouth once a week.    . esomeprazole (NEXIUM) 40 MG capsule Take 40 mg by mouth daily at 12 noon.    Marland Kitchen KLOR-CON M20 20 MEQ tablet Take 20 mEq by mouth daily.    . metoprolol tartrate (LOPRESSOR) 25 MG tablet Take 25 mg by mouth 2 (two) times daily.    . nitroGLYCERIN (NITROSTAT) 0.4 MG SL tablet Place 1 tablet (0.4 mg total) under the tongue every 5 (five) minutes as needed for chest pain. 90 tablet 3  . oxybutynin (DITROPAN) 5 MG tablet Take 5 mg by mouth every 8 (eight) hours as needed for bladder spasms.   2  . Prenatal Multivit-Min-Fe-FA (PRENATAL 1 + IRON PO) Take 1 tablet by mouth daily.    . pantoprazole (PROTONIX) 40 MG tablet Take 1 tablet (40 mg total) by mouth daily. 30 tablet 1  . saccharomyces boulardii (FLORASTOR) 250 MG capsule Take 1 capsule (250 mg total) by mouth 2 (two) times daily. 60 capsule 0  . simvastatin (ZOCOR) 40 MG tablet Take  1 tablet (40 mg total) by mouth daily at 6 PM. 90 tablet 3   Facility-Administered Medications Prior to Visit  Medication Dose Route Frequency Provider Last Rate Last Admin  . 0.9 %  sodium chloride infusion  500 mL Intravenous Once [name], MD        Review of Systems  Constitutional: Negative for chills, diaphoresis, fever, malaise/fatigue and weight  loss.  HENT: Negative for congestion, sinus pain and sore throat.   Eyes: Negative.   Respiratory: Negative for cough, hemoptysis, sputum production, shortness of breath and wheezing.   Cardiovascular: Negative for chest pain, palpitations, orthopnea, claudication, leg swelling and PND.  Gastrointestinal: Negative.  Negative for heartburn.  Genitourinary: Negative.   Musculoskeletal: Positive for back pain (at site of chest tube, with lump).  Skin: Negative for itching and rash.  Neurological: Negative.   Endo/Heme/Allergies: Negative.   Psychiatric/Behavioral: Negative.     Objective:   Vitals:   05/07/20 1152  BP: 136/68  Pulse: 68  Temp: 97.8 F (36.6 C)  TempSrc: Temporal  SpO2: 100%  Weight: 155 lb 3.2 oz (70.4 kg)  Height: 5\' 8"  (1.727 m)     Physical Exam Constitutional:      General: He is not in acute distress.    Appearance: Normal appearance. He is normal weight. He is not ill-appearing.  HENT:     Head: Normocephalic and atraumatic.     Mouth/Throat:     Mouth: Mucous membranes are dry.     Pharynx: Oropharynx is clear.  Eyes:     General: No scleral icterus.    Conjunctiva/sclera: Conjunctivae normal.     Pupils: Pupils are equal, round, and reactive to light.  Cardiovascular:     Rate and Rhythm: Normal rate and regular rhythm.     Pulses: Normal pulses.     Heart sounds: Normal heart sounds. No murmur heard.   Pulmonary:     Effort: Pulmonary effort is normal.     Breath sounds: Decreased breath sounds (right base and mid lung fields) present. No wheezing or rhonchi.  Chest:     Chest  wall: Mass (right posterior thorax near prior thoracotomy incision and chest tube incision. No erythema or warmth) present.  Abdominal:     General: Bowel sounds are normal.     Palpations: Abdomen is soft.  Musculoskeletal:     Cervical back: Neck supple.     Right lower leg: No edema.     Left lower leg: No edema.  Lymphadenopathy:     Cervical: No cervical adenopathy.  Skin:    General: Skin is warm and dry.     Capillary Refill: Capillary refill takes less than 2 seconds.  Neurological:     General: No focal deficit present.     Mental Status: He is alert and oriented to person, place, and time. Mental status is at baseline.     Gait: Gait normal.  Psychiatric:        Mood and Affect: Mood normal.        Behavior: Behavior normal.        Thought Content: Thought content normal.        Judgment: Judgment normal.     CBC    Component Value Date/Time   WBC 8.7 03/31/2020 0301   RBC 3.98 (L) 03/31/2020 0301   HGB 11.1 (L) 03/31/2020 0301   HGB 13.9 07/29/2019 1139   HGB 14.1 01/16/2017 0753   HCT 34.6 (L) 03/31/2020 0301   HCT 42.7 01/16/2017 0753   PLT 356 03/31/2020 0301   PLT 198 07/29/2019 1139   PLT 168 01/16/2017 0753   MCV 86.9 03/31/2020 0301   MCV 87.9 01/16/2017 0753   MCH 27.9 03/31/2020 0301   MCHC 32.1 03/31/2020 0301   RDW 13.8 03/31/2020 0301   RDW 13.7 01/16/2017 0753   LYMPHSABS 1.2 03/31/2020 0301   LYMPHSABS 1.5  01/16/2017 0753   MONOABS 1.1 (H) 03/31/2020 0301   MONOABS 0.7 01/16/2017 0753   EOSABS 0.1 03/31/2020 0301   EOSABS 0.1 01/16/2017 0753   BASOSABS 0.0 03/31/2020 0301   BASOSABS 0.0 01/16/2017 0753     Chest imaging: 03/29/20 CT Chest 1. The low-density nodular process along the right major fissure is similar to prior study and new since 07/29/2019. Low-density suggests that this is loculated fluid. Continued close attention recommended. 2. Posterior right pleural drain with interval decrease in right pleural fluid and residual  fluid/debris/gas in the posterior right pleural space. 3. Residual collapse/consolidative opacity in the posterior right lower lobe. 4. New subsegmental atelectasis in the left lower lobe. 5. Cholelithiasis. 6. Aortic Atherosclerosis (ICD10-I70.0).  04/23/20 CXR Interval removal of right chest tube with re-accumulation of a moderate posteriorly loculated right pneumothorax.  Stable right-sided volume loss secondary to partial right lung Resection.  05/07/20 Unchanged loculated right pleural effusion.  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  He has recurrent loculated effusion on the right, concerning for unresolved empyema from his initial presentation. Chest radiographs since discharge confirm recurrence and CT chest with contrast done on 05/08/20 is concerning for empyema with connection to the chest wall through the previous chest tube tract which explains why he has the lump present on the right posterior thorax where the previous chest tube was located.  Patient is clinically stable at this time. I will get in contact with Cardiothoracic surgery and then notify the patient of the plan. He will need readmission for further treatment of this complicated pleural space.   Freda Jackson, MD [facility] Office: [phone]   See Amion for Pager Details     Current Outpatient Medications:  .  amLODipine (NORVASC) 5 MG tablet, Take 1 tablet by mouth daily., Disp: , Rfl:  .  aspirin 325 MG EC tablet, Take 1 tablet (325 mg total) by mouth daily. Please hold for 15 days when you are on Eliquis, resume on 10/11 once you are  off Eliquis., Disp: , Rfl:  .  ergocalciferol (VITAMIN D2) 1.25 MG (50000 UT) capsule, Take 50,000 Units by mouth once a week., Disp: , Rfl:  .  esomeprazole (NEXIUM) 40 MG capsule, Take 40 mg by mouth daily at 12 noon., Disp: , Rfl:  .  KLOR-CON M20 20 MEQ tablet, Take 20 mEq by mouth daily., Disp: , Rfl:  .  metoprolol tartrate (LOPRESSOR) 25 MG tablet, Take 25 mg by mouth 2 (two) times daily., Disp: , Rfl:  .  nitroGLYCERIN (NITROSTAT) 0.4 MG SL tablet, Place 1 tablet (0.4 mg total) under the tongue every 5 (five) minutes as needed for chest pain., Disp: 90 tablet, Rfl: 3 .  oxybutynin (DITROPAN) 5 MG tablet, Take 5 mg by mouth every 8 (eight) hours as needed for bladder spasms. , Disp: , Rfl: 2 .  Prenatal Multivit-Min-Fe-FA (PRENATAL 1 + IRON PO), Take 1 tablet by mouth daily., Disp: , Rfl:   Current Facility-Administered Medications:  .  0.9 %  sodium chloride infusion, 500 mL, Intravenous, Once, [name], MD

## 2020-05-07 NOTE — Patient Instructions (Signed)
We will check a CT chest scan with contrast to evaluate your chest wall hematoma/lesion We will call you with the results

## 2020-05-08 ENCOUNTER — Ambulatory Visit
Admission: RE | Admit: 2020-05-08 | Discharge: 2020-05-08 | Disposition: A | Discharge status: Home / Self Care | Payer: Medicare Other | Source: Ambulatory Visit | Attending: Pulmonary Disease | Admitting: Pulmonary Disease

## 2020-05-08 DIAGNOSIS — J869 Pyothorax without fistula: Secondary | ICD-10-CM

## 2020-05-08 MED ORDER — IOPAMIDOL (ISOVUE-300) INJECTION 61%
75.0000 mL | Freq: Once | INTRAVENOUS | Status: AC | PRN
  Administered 2020-05-08: 75 mL via INTRAVENOUS

## 2020-05-09 ENCOUNTER — Encounter (HOSPITAL_COMMUNITY): Payer: Self-pay | Admitting: Emergency Medicine

## 2020-05-09 ENCOUNTER — Inpatient Hospital Stay (HOSPITAL_COMMUNITY)
Admission: EM | Admit: 2020-05-09 | Discharge: 2020-05-28 | DRG: 166 | Disposition: A | Discharge status: Home Health | Payer: Medicare Other | Attending: Internal Medicine | Admitting: Internal Medicine

## 2020-05-09 ENCOUNTER — Other Ambulatory Visit: Payer: Self-pay

## 2020-05-09 DIAGNOSIS — R001 Bradycardia, unspecified: Secondary | ICD-10-CM | POA: Diagnosis not present

## 2020-05-09 DIAGNOSIS — E43 Unspecified severe protein-calorie malnutrition: Secondary | ICD-10-CM | POA: Diagnosis present

## 2020-05-09 DIAGNOSIS — Z86010 Personal history of colonic polyps: Secondary | ICD-10-CM

## 2020-05-09 DIAGNOSIS — J90 Pleural effusion, not elsewhere classified: Secondary | ICD-10-CM | POA: Diagnosis present

## 2020-05-09 DIAGNOSIS — J869 Pyothorax without fistula: Secondary | ICD-10-CM | POA: Diagnosis not present

## 2020-05-09 DIAGNOSIS — Z79899 Other long term (current) drug therapy: Secondary | ICD-10-CM | POA: Diagnosis not present

## 2020-05-09 DIAGNOSIS — J449 Chronic obstructive pulmonary disease, unspecified: Secondary | ICD-10-CM | POA: Diagnosis present

## 2020-05-09 DIAGNOSIS — Z955 Presence of coronary angioplasty implant and graft: Secondary | ICD-10-CM

## 2020-05-09 DIAGNOSIS — Z85118 Personal history of other malignant neoplasm of bronchus and lung: Secondary | ICD-10-CM

## 2020-05-09 DIAGNOSIS — Z9221 Personal history of antineoplastic chemotherapy: Secondary | ICD-10-CM

## 2020-05-09 DIAGNOSIS — I2510 Atherosclerotic heart disease of native coronary artery without angina pectoris: Secondary | ICD-10-CM | POA: Diagnosis present

## 2020-05-09 DIAGNOSIS — I252 Old myocardial infarction: Secondary | ICD-10-CM | POA: Diagnosis not present

## 2020-05-09 DIAGNOSIS — K5900 Constipation, unspecified: Secondary | ICD-10-CM | POA: Diagnosis not present

## 2020-05-09 DIAGNOSIS — K219 Gastro-esophageal reflux disease without esophagitis: Secondary | ICD-10-CM | POA: Diagnosis present

## 2020-05-09 DIAGNOSIS — I10 Essential (primary) hypertension: Secondary | ICD-10-CM | POA: Diagnosis present

## 2020-05-09 DIAGNOSIS — R3912 Poor urinary stream: Secondary | ICD-10-CM | POA: Diagnosis present

## 2020-05-09 DIAGNOSIS — I4510 Unspecified right bundle-branch block: Secondary | ICD-10-CM | POA: Diagnosis present

## 2020-05-09 DIAGNOSIS — Z823 Family history of stroke: Secondary | ICD-10-CM

## 2020-05-09 DIAGNOSIS — Z888 Allergy status to other drugs, medicaments and biological substances status: Secondary | ICD-10-CM

## 2020-05-09 DIAGNOSIS — Z87891 Personal history of nicotine dependence: Secondary | ICD-10-CM | POA: Diagnosis not present

## 2020-05-09 DIAGNOSIS — J860 Pyothorax with fistula: Principal | ICD-10-CM | POA: Diagnosis present

## 2020-05-09 DIAGNOSIS — Z8616 Personal history of COVID-19: Secondary | ICD-10-CM | POA: Diagnosis not present

## 2020-05-09 DIAGNOSIS — I454 Nonspecific intraventricular block: Secondary | ICD-10-CM | POA: Diagnosis present

## 2020-05-09 DIAGNOSIS — J948 Other specified pleural conditions: Secondary | ICD-10-CM | POA: Diagnosis present

## 2020-05-09 DIAGNOSIS — I96 Gangrene, not elsewhere classified: Secondary | ICD-10-CM | POA: Diagnosis present

## 2020-05-09 DIAGNOSIS — Z20822 Contact with and (suspected) exposure to covid-19: Secondary | ICD-10-CM | POA: Diagnosis present

## 2020-05-09 DIAGNOSIS — Z7982 Long term (current) use of aspirin: Secondary | ICD-10-CM

## 2020-05-09 DIAGNOSIS — Z09 Encounter for follow-up examination after completed treatment for conditions other than malignant neoplasm: Secondary | ICD-10-CM

## 2020-05-09 DIAGNOSIS — Z902 Acquired absence of lung [part of]: Secondary | ICD-10-CM

## 2020-05-09 DIAGNOSIS — Z6822 Body mass index (BMI) 22.0-22.9, adult: Secondary | ICD-10-CM

## 2020-05-09 DIAGNOSIS — Z923 Personal history of irradiation: Secondary | ICD-10-CM

## 2020-05-09 DIAGNOSIS — H269 Unspecified cataract: Secondary | ICD-10-CM | POA: Diagnosis present

## 2020-05-09 DIAGNOSIS — E785 Hyperlipidemia, unspecified: Secondary | ICD-10-CM | POA: Diagnosis present

## 2020-05-09 DIAGNOSIS — D638 Anemia in other chronic diseases classified elsewhere: Secondary | ICD-10-CM | POA: Diagnosis present

## 2020-05-09 DIAGNOSIS — N401 Enlarged prostate with lower urinary tract symptoms: Secondary | ICD-10-CM | POA: Diagnosis present

## 2020-05-09 LAB — CBC
HCT: 34.7 % — ABNORMAL LOW (ref 39.0–52.0)
Hemoglobin: 10.6 g/dL — ABNORMAL LOW (ref 13.0–17.0)
MCH: 26.2 pg (ref 26.0–34.0)
MCHC: 30.5 g/dL (ref 30.0–36.0)
MCV: 85.7 fL (ref 80.0–100.0)
Platelets: 360 10*3/uL (ref 150–400)
RBC: 4.05 MIL/uL — ABNORMAL LOW (ref 4.22–5.81)
RDW: 14.7 % (ref 11.5–15.5)
WBC: 6.4 10*3/uL (ref 4.0–10.5)
nRBC: 0 % (ref 0.0–0.2)

## 2020-05-09 LAB — BASIC METABOLIC PANEL WITH GFR
Anion gap: 11 (ref 5–15)
BUN: 11 mg/dL (ref 8–23)
CO2: 26 mmol/L (ref 22–32)
Calcium: 9.5 mg/dL (ref 8.9–10.3)
Chloride: 100 mmol/L (ref 98–111)
Creatinine, Ser: 1 mg/dL (ref 0.61–1.24)
GFR, Estimated: >60 mL/min
Glucose, Bld: 109 mg/dL — ABNORMAL HIGH (ref 70–99)
Potassium: 4.2 mmol/L (ref 3.5–5.1)
Sodium: 137 mmol/L (ref 135–145)

## 2020-05-09 LAB — RESP PANEL BY RT PCR (RSV, FLU A&B, COVID)
Influenza A by PCR: NEGATIVE
Influenza B by PCR: NEGATIVE
Respiratory Syncytial Virus by PCR: NEGATIVE
SARS Coronavirus 2 by RT PCR: NEGATIVE

## 2020-05-09 MED ORDER — ACETAMINOPHEN 325 MG PO TABS
650.0000 mg | ORAL_TABLET | Freq: Four times a day (QID) | ORAL | Status: DC | PRN
  Filled 2020-05-09: qty 2

## 2020-05-09 MED ORDER — ONDANSETRON HCL 4 MG/2ML IJ SOLN: 4.0000 mg | Freq: Four times a day (QID) | INTRAMUSCULAR | Status: DC | PRN

## 2020-05-09 MED ORDER — SODIUM CHLORIDE 0.9 % IV SOLN
1.0000 g | INTRAVENOUS | Status: DC
  Administered 2020-05-09: 1 g via INTRAVENOUS
  Filled 2020-05-09: qty 10

## 2020-05-09 MED ORDER — AMLODIPINE BESYLATE 5 MG PO TABS
5.0000 mg | ORAL_TABLET | Freq: Every day | ORAL | Status: DC
  Administered 2020-05-10: 5 mg via ORAL
  Filled 2020-05-09: qty 1

## 2020-05-09 MED ORDER — ASPIRIN EC 325 MG PO TBEC
325.0000 mg | DELAYED_RELEASE_TABLET | Freq: Every day | ORAL | Status: DC
  Administered 2020-05-10 – 2020-05-28 (×15): 325 mg via ORAL
  Filled 2020-05-09 (×16): qty 1

## 2020-05-09 MED ORDER — PANTOPRAZOLE SODIUM 40 MG PO TBEC
40.0000 mg | DELAYED_RELEASE_TABLET | Freq: Every day | ORAL | Status: DC
  Administered 2020-05-10 – 2020-05-12 (×2): 40 mg via ORAL
  Filled 2020-05-09 (×2): qty 1

## 2020-05-09 MED ORDER — ENOXAPARIN SODIUM 40 MG/0.4ML ~~LOC~~ SOLN
40.0000 mg | SUBCUTANEOUS | Status: DC
  Administered 2020-05-09 – 2020-05-10 (×2): 40 mg via SUBCUTANEOUS
  Filled 2020-05-09 (×2): qty 0.4

## 2020-05-09 MED ORDER — METOPROLOL TARTRATE 25 MG PO TABS
25.0000 mg | ORAL_TABLET | Freq: Two times a day (BID) | ORAL | Status: DC
  Administered 2020-05-09 – 2020-05-10 (×2): 25 mg via ORAL
  Filled 2020-05-09 (×2): qty 1

## 2020-05-09 MED ORDER — POTASSIUM CHLORIDE CRYS ER 20 MEQ PO TBCR
20.0000 meq | EXTENDED_RELEASE_TABLET | Freq: Every day | ORAL | Status: DC
  Administered 2020-05-09 – 2020-05-22 (×11): 20 meq via ORAL
  Filled 2020-05-09 (×11): qty 1

## 2020-05-09 MED ORDER — ONDANSETRON HCL 4 MG PO TABS: 4.0000 mg | ORAL_TABLET | Freq: Four times a day (QID) | ORAL | Status: DC | PRN

## 2020-05-09 MED ORDER — OXYBUTYNIN CHLORIDE 5 MG PO TABS
5.0000 mg | ORAL_TABLET | Freq: Three times a day (TID) | ORAL | Status: DC | PRN
  Filled 2020-05-09: qty 1

## 2020-05-09 MED ORDER — ACETAMINOPHEN 650 MG RE SUPP: 650.0000 mg | Freq: Four times a day (QID) | RECTAL | Status: DC | PRN

## 2020-05-09 MED ORDER — VITAMIN D (ERGOCALCIFEROL) 1.25 MG (50000 UNIT) PO CAPS
50000.0000 [IU] | ORAL_CAPSULE | ORAL | Status: DC
  Administered 2020-05-18 – 2020-05-25 (×2): 50000 [IU] via ORAL
  Filled 2020-05-09 (×3): qty 1

## 2020-05-09 MED ORDER — OXYCODONE HCL 5 MG PO TABS: 5.0000 mg | ORAL_TABLET | ORAL | Status: DC | PRN

## 2020-05-09 NOTE — Consult Note (Addendum)
[address]       [name]             [phone]        [name] Date of Birth: 1940-07-17  Referring: Wynetta Fines, MD Primary Care: [name], MD Primary Cardiologist:No primary care provider on file.  Chief Complaint:    Chief Complaint  Patient presents with  . 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  He had subsequent chemotherapy and radiation.  He is followed routinely by Dr. Earlie Server.  He was admitted to an outside hospital for 10 days in mid September of this year with a right-sided empyema that was managed primarily by the pulmonary medicine and internal medicine services with pleural drainage and IV antibiotics.   He was covered with a 3-week course of oral antibiotics following discharge for pansensitive strept intermedius cultured from the pleural drainage during that admission. During that admission in September, he was also incidentally found to be COVID positive.  He did not present with any Covid-related symptoms but was found to be positive on routine screening.  He was treated with oral steroids and remdesivir.  He reports that he lost his taste for about 3 weeks and as result lost about 20 pounds.  His taste has returned and his appetite is currently improving. Mathew Hall recently sought medical attention from his pulmonologist after discovering a "lump" on the right side of his lower back at the site where the previous chest tube has been placed.  He was seen by Dr. Freda Jackson 2 days ago and a CT scan of the chest was obtained.  This demonstrated recurrent right-sided posterior  loculated pleural effusion.  This appears to communicate with a fluid collection in the subcutaneous tissue corresponding to the lump the patient was sensing.  Mathew Hall presented to the ED earlier today for further evaluation.  He was found to have stable vital signs with no hypoxia and was afebrile.  His white blood count was 6400.  He denies having any shortness of breath, chills, or cough.  He does have some minor discomfort around the lump in his right back.   Mathew Hall is accompanied by his daughter today who was at the bedside during this interview.  He retired from [employer].   Current Activity/ Functional Status:    Zubrod Score: At the time of surgery this patient's most appropriate activity status/level should be described as: [x]     0    Normal activity, no symptoms []     1    Restricted in physical strenuous activity but ambulatory, able to do out light work []     2    Ambulatory and capable of self care, unable to do work activities, up and about                 more than 50%  Of the time                            []     3    Only limited self care, in bed greater than 50%  of waking hours []     4    Completely disabled, no self care, confined to bed or chair []     5    Moribund  Past Medical History:  Diagnosis Date  . Asthma   . CAD (coronary artery disease) 1999   stents to RCA, residual 40% Lt main, decreased EF40%   . Cataract    bilateral-beginning stages  . Colon polyps   . GERD (gastroesophageal reflux disease)   . H/O cardiovascular stress test 1999/2005/2008/2011   last with inf scar/infarct, EF 54%  . H/O echocardiogram 2008   EF 50-55%, Mild MR, Mild TR,    . Hemorrhoids   . 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 Hyperlipemia   . Hypertension   . lung ca dx'd 10/2008   chemo comp 01/2009-stage 4 lung cancer  . Myocardial infarction ([place]) 02-18-1998  . Peptic stricture of esophagus   . Right carotid bruit 04/01/12    minimal carotid stenosis on doppler    Past Surgical History:  Procedure Laterality Date  . ANGIOPLASTY     with 2 stent placement  . COLONOSCOPY    . CORONARY ANGIOPLASTY WITH STENT PLACEMENT  02/18/1998   PCI/stents to ostial and mid RCA, emergently, residual Lt main disease  . LUNG SURGERY     right  . POLYPECTOMY     HPP 09-21-08    Social History   Tobacco Use  Smoking Status Former Smoker  . Packs/day: 2.00  . Types: Cigarettes  . Quit date: 07/16/1997  . Years since quitting: 22.8  Smokeless Tobacco Never Used    Social History   Substance and Sexual Activity  Alcohol Use Yes   Comment: occasionally     Allergies  Allergen Reactions  . Lipitor [Atorvastatin Calcium]     Knee pain   . Atorvastatin Other (See Comments)    Joint/knee pain    Current Facility-Administered Medications  Medication Dose Route Frequency Provider Last Rate Last Admin  . 0.9 %  sodium chloride infusion  500 mL Intravenous Once [name], MD      . acetaminophen (TYLENOL) tablet 650 mg  650 mg Oral Q6H PRN Lequita Halt, MD       Or  . acetaminophen (TYLENOL) suppository 650 mg  650 mg Rectal Q6H PRN Wynetta Fines T, MD      . Derrill Memo ON 05/10/2020] amLODipine (NORVASC) tablet 5 mg  5 mg Oral Daily Wynetta Fines T, MD      . Derrill Memo ON 05/10/2020] aspirin EC tablet 325 mg  325 mg Oral Daily Wynetta Fines T, MD      . cefTRIAXone (ROCEPHIN) 1 g in sodium chloride 0.9 % 100 mL IVPB  1 g Intravenous Q24H Wynetta Fines T, MD      . enoxaparin (LOVENOX) injection 40 mg  40 mg Subcutaneous Q24H Wynetta Fines T, MD   40 mg at 05/09/20 1426  . metoprolol tartrate (LOPRESSOR) tablet 25 mg  25 mg Oral BID Wynetta Fines T, MD      . ondansetron [facility]) tablet 4 mg  4 mg Oral Q6H PRN Wynetta Fines T, MD       Or  . ondansetron [facility]) injection 4 mg  4 mg Intravenous Q6H PRN Wynetta Fines T, MD      . oxybutynin (DITROPAN) tablet 5 mg  5 mg Oral Q8H PRN Lequita Halt, MD      . oxyCODONE (Oxy  IR/ROXICODONE) immediate  release tablet 5 mg  5 mg Oral Q4H PRN Wynetta Fines T, MD      . Derrill Memo ON 05/10/2020] pantoprazole (PROTONIX) EC tablet 40 mg  40 mg Oral Daily Wynetta Fines T, MD      . potassium chloride SA (KLOR-CON) CR tablet 20 mEq  20 mEq Oral Daily Lequita Halt, MD      . Derrill Memo ON 05/11/2020] Vitamin D (Ergocalciferol) (DRISDOL) capsule 50,000 Units  50,000 Units Oral Weekly Lequita Halt, MD       Current Outpatient Medications  Medication Sig Dispense Refill  . amLODipine (NORVASC) 5 MG tablet Take 1 tablet by mouth daily.    Marland Kitchen aspirin 325 MG EC tablet Take 1 tablet (325 mg total) by mouth daily. Please hold for 15 days when you are on Eliquis, resume on 10/11 once you are off Eliquis.    Marland Kitchen ergocalciferol (VITAMIN D2) 1.25 MG (50000 UT) capsule Take 50,000 Units by mouth once a week. Friday    . esomeprazole (NEXIUM) 40 MG capsule Take 40 mg by mouth daily at 12 noon.    Marland Kitchen KLOR-CON M20 20 MEQ tablet Take 20 mEq by mouth daily.    . metoprolol tartrate (LOPRESSOR) 25 MG tablet Take 25 mg by mouth 2 (two) times daily.    . nitroGLYCERIN (NITROSTAT) 0.4 MG SL tablet Place 1 tablet (0.4 mg total) under the tongue every 5 (five) minutes as needed for chest pain. 90 tablet 3  . oxybutynin (DITROPAN) 5 MG tablet Take 5 mg by mouth every 8 (eight) hours as needed for bladder spasms.   2  . phenylephrine (SUDAFED PE) 10 MG TABS tablet Take 10 mg by mouth every 4 (four) hours as needed (sinus congestion).    . Prenatal Multivit-Min-Fe-FA (PRENATAL 1 + IRON PO) Take 1 tablet by mouth daily.      (Not in a hospital admission)   Family History  Problem Relation Age of Onset  . Healthy Sister   . Healthy Sister   . Stroke Mother   . Colon cancer Neg Hx   . Rectal cancer Neg Hx   . Stomach cancer Neg Hx      Review of Systems:   ROS     Cardiac Review of Systems: Y or  [    ]= no  Chest Pain [    ]  Resting SOB [   ] Exertional SOB  [  ]  Orthopnea [  ]   Pedal Edema [   ]     Palpitations [  ] Syncope  [  ]   Presyncope [   ]  General Review of Systems: [Y] = yes [  ]=no Constitional: recent weight change Blue.Reese  ]; anorexia [  ]; fatigue [  ]; nausea [  ]; night sweats [  ]; fever [  ]; or chills [  ]                                                                Eye : blurred vision [  ]; diplopia [   ]; vision changes [  ];  Amaurosis fugax[   Resp: cough [  ];  wheezing[  ];  hemoptysis[  ]; shortness of breath[  ];  paroxysmal nocturnal dyspnea[  ]; dyspnea on exertion[  ]; or orthopnea[  ];  GI:  gallstones[  ], vomiting[  ];  dysphagia[  ]; melena[  ];  hematochezia [  ]; heartburn[  ];   Hx of  Colonoscopy[  ]; GU: kidney stones [  ]; hematuria[  ];   dysuria [  ];  nocturia[  ];  history of     obstruction [  ]; urinary frequency [  ]             Skin: rash, swelling[  ];, hair loss[  ];  peripheral edema[  ];  or itching[  ]; Musculosketetal: myalgias[  ];  joint swelling[  ];  joint erythema[  ];  joint pain[  ];  back pain[  ];  Heme/Lymph: bruising[  ];  bleeding[  ];  anemia[  ];  Neuro: TIA[  ];  headaches[  ];  stroke[  ];  vertigo[  ];  seizures[  ];   paresthesias[  ];  difficulty walking[  ];  Psych:depression[  ]; anxiety[  ];  Endocrine: diabetes[  ];  thyroid dysfunction[  ];               Physical Exam: BP 124/67 (BP Location: Right Arm)   Pulse 63   Temp 97.9 F (36.6 C) (Oral)   Resp 16   SpO2 100%    General appearance: alert, cooperative and no distress Head: Normocephalic, without obvious abnormality, atraumatic Neck: no adenopathy, no carotid bruit, no JVD and supple, symmetrical, trachea midline Lymph nodes: Cervical, supraclavicular, and axillary nodes normal. and No obvious lymphadenopathy Resp: clear to auscultation bilaterally Back: symmetric, no curvature. ROM normal. No CVA tenderness., There is a cyst sized mass on the patient's back just below and lateral to the scapula. Cardio: Regular rate and rhythm, heart sounds are  distant, I did not hear murmur. GI: Abdomen is soft and nontender.  Normal bowel sounds. Extremities: All are well perfused, no edema peripherally. Neurologic: Grossly normal  Diagnostic Studies & Laboratory data:     Recent Radiology Findings:  EXAM: CHEST - 2 VIEW  COMPARISON:  04/23/2020  FINDINGS: Normal heart size. Postsurgical changes in volume loss are again noted within the right hemithorax. Loculated right pleural effusion is again noted and appears unchanged from 04/23/2020. No left pleural effusion. No acute airspace densities. Visualized osseous structures are unremarkable.  IMPRESSION: Unchanged loculated right pleural effusion.   Electronically Signed   By: Kerby Moors M.D.   On: 05/07/2020 12:15   I have independently reviewed the above radiologic studies and discussed with the patient   Recent Lab Findings: Lab Results  Component Value Date   WBC 6.4 05/09/2020   HGB 10.6 (L) 05/09/2020   HCT 34.7 (L) 05/09/2020   PLT 360 05/09/2020   GLUCOSE 109 (H) 05/09/2020   ALT 25 03/31/2020   AST 27 03/31/2020   NA 137 05/09/2020   K 4.2 05/09/2020   CL 100 05/09/2020   CREATININE 1.00 05/09/2020   BUN 11 05/09/2020   CO2 26 05/09/2020   TSH 1.228 03/27/2020   INR 1.0 03/01/2009   HGBA1C 5.6 03/23/2020      Assessment / Plan:      Mathew Hall is a pleasant 79 year old male with a history of bronchioloalveolar adenocarcinoma with right middle lobe lung resection 11 years ago and more recently status post pleural drainage for a right sided empyema.  He completed a full course of IV and  oral antibiotics for strept intermedius cultured from the pleural space back in September of this year.  He now presents after noting the development of a mass on his back just below and lateral to the right scapula.  He sought medical attention from his pulmonologist and was seen by Dr. Yvone Neu earlier this week.  A CT scan of the chest was obtained that  demonstrates a small to moderate loculated right posterior fluid collection that appears to communicate with the mass in the subcutaneous tissue overlying the chest wall posteriorly.  Mathew Hall is being admitted to the hospital by the internal medicine service.  He has been started back on Rocephin since the previously cultured staph intermedius was pansensitive.  We will tentatively plan for surgical evacuation of the loculated fluid collection of the right posterior pleural space as well as the soft tissue fluid collection in the operating room on Friday, 05/11/2020.  Mathew Hall is currently in no acute distress and denies having any significant pain.  He is well oxygenated on room air.  He and his daughter agree with this plan of management.  Mathew Hall will be evaluated later by Dr. Roxan Hockey.    I  spent 20 minutes counseling the patient face to face.   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 Recent COVID infection and pneumonia complicated by empyema. Drained with tube and treated with antibiotics with improvement of symptoms initially.  Now with 2 week history of "lump" on back. No fever or chills. CT shows an organized empyema necessitans. WIll require antibiotics and drainage.   Will discuss with IR. However, I think this will likely require open surgical drainage.  Agree with Unasyn  Remo Lipps C. Roxan Hockey, MD [facility] [phone]

## 2020-05-09 NOTE — H&P (Signed)
History and Physical    Mathew Hall:[id number] DOB: September 26, 1940 DOA: 05/09/2020  PCP: [name], MD (Confirm with patient/family/NH records and if not entered, this has to be entered at [facility] point of entry) Patient coming from: Home  I have personally briefly reviewed patient's old medical records in [place]  Chief Complaint: Back pain  HPI: Mathew Hall is a 79 y.o. male with medical history significant of right-sided empyema recently September 16, remote lung CA on right side, recent COVID infection in September 2021, hypertension, GERD, presented with recurrent right-sided empyema.  Patient was hospitalized last month for right-sided empyema, status post chest tube, which was pulled out on 9/24.  Culture showed Streptococcus intermedius pansensitive, patient was treated with 4 days of Rocephin then discharged home with Keflex for 3 more weeks which completed about 2-3 weeks ago.  About 2 weeks ago patient woke up 1 day feeling was " lump" developed on the right sided lower back, where the chest tube was placed.  Denied any fever chills shortness of breath or cough.  He contacted his pulmonologist who saw him this Monday and CT chest yesterday showed recurrent right-sided empyema.  Pulmonology recommend patient admitted to hospital for treatment of recurrent empyema failed outpatient treatment. ED Course: Vitals stable, no hypoxia no fever, WBC 6.4.  CT outpatient yesterday showed recurrent right sided pleural effusion with a herniated sac out of chest wall?  Review of Systems: As per HPI otherwise 14 point review of systems negative.   Past Medical History:  Diagnosis Date  . Asthma   . CAD (coronary artery disease) 1999   stents to RCA, residual 40% Lt main, decreased EF40%   . Cataract    bilateral-beginning stages  . Colon polyps   . GERD (gastroesophageal reflux disease)   . H/O cardiovascular stress test 1999/2005/2008/2011   last with inf scar/infarct, EF  54%  . H/O echocardiogram 2008   EF 50-55%, Mild MR, Mild TR,    . Hemorrhoids   . 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 Hyperlipemia   . Hypertension   . lung ca dx'd 10/2008   chemo comp 01/2009-stage 4 lung cancer  . Myocardial infarction ([place]) 02-18-1998  . Peptic stricture of esophagus   . Right carotid bruit 04/01/12   minimal carotid stenosis on doppler    Past Surgical History:  Procedure Laterality Date  . ANGIOPLASTY     with 2 stent placement  . COLONOSCOPY    . CORONARY ANGIOPLASTY WITH STENT PLACEMENT  02/18/1998   PCI/stents to ostial and mid RCA, emergently, residual Lt main disease  . LUNG SURGERY     right  . POLYPECTOMY     HPP 09-21-08     reports that he quit smoking about 22 years ago. His smoking use included cigarettes. He smoked 2.00 packs per day. He has never used smokeless tobacco. He reports current alcohol use. He reports that he does not use drugs.  Allergies  Allergen Reactions  . Lipitor [Atorvastatin Calcium]     Knee pain   . Atorvastatin Other (See Comments)    Joint/knee pain    Family History  Problem Relation Age of Onset  . Healthy Sister   . Healthy Sister   . Stroke Mother   . Colon cancer Neg Hx   . Rectal cancer Neg Hx   . Stomach cancer Neg Hx      Prior to Admission medications   Medication Sig  Start Date End Date Taking? Authorizing Provider  amLODipine (NORVASC) 5 MG tablet Take 1 tablet by mouth daily. 08/30/18  Yes [provider]  aspirin 325 MG EC tablet Take 1 tablet (325 mg total) by mouth daily. Please hold for 15 days when you are on Eliquis, resume on 10/11 once you are off Eliquis. 04/16/20  Yes Elgergawy, Silver Huguenin, MD  ergocalciferol (VITAMIN D2) 1.25 MG (50000 UT) capsule Take 50,000 Units by mouth once a week. Friday   Yes [provider]  esomeprazole (NEXIUM) 40 MG capsule Take 40 mg by mouth daily at 12 noon.   Yes [provider]  KLOR-CON M20 20 MEQ  tablet Take 20 mEq by mouth daily. 04/13/20  Yes [provider]  metoprolol tartrate (LOPRESSOR) 25 MG tablet Take 25 mg by mouth 2 (two) times daily.   Yes [provider]  nitroGLYCERIN (NITROSTAT) 0.4 MG SL tablet Place 1 tablet (0.4 mg total) under the tongue every 5 (five) minutes as needed for chest pain. 09/01/17  Yes Lorretta Harp, MD  oxybutynin (DITROPAN) 5 MG tablet Take 5 mg by mouth every 8 (eight) hours as needed for bladder spasms.  01/02/15  Yes [provider]  phenylephrine (SUDAFED PE) 10 MG TABS tablet Take 10 mg by mouth every 4 (four) hours as needed (sinus congestion).   Yes [provider]  Prenatal Multivit-Min-Fe-FA (PRENATAL 1 + IRON PO) Take 1 tablet by mouth daily.   Yes [provider]    Physical Exam: Vitals:   05/09/20 1046  BP: 124/67  Pulse: 63  Resp: 16  Temp: 97.9 F (36.6 C)  TempSrc: Oral  SpO2: 100%    Constitutional: NAD, calm, comfortable Vitals:   05/09/20 1046  BP: 124/67  Pulse: 63  Resp: 16  Temp: 97.9 F (36.6 C)  TempSrc: Oral  SpO2: 100%   Eyes: PERRL, lids and conjunctivae normal ENMT: Mucous membranes are moist. Posterior pharynx clear of any exudate or lesions.Normal dentition.  Neck: normal, supple, no masses, no thyromegaly Respiratory: Right sided mid back lump as shown in pic, clear to auscultation bilaterally, no wheezing, no crackles. Normal respiratory effort. No accessory muscle use.  Cardiovascular: Regular rate and rhythm, no murmurs / rubs / gallops. No extremity edema. 2+ pedal pulses. No carotid bruits.  Abdomen: no tenderness, no masses palpated. No hepatosplenomegaly. Bowel sounds positive.  Musculoskeletal: no clubbing / cyanosis. No joint deformity upper and lower extremities. Good ROM, no contractures. Normal muscle tone.  Skin: no rashes, lesions, ulcers. No induration Neurologic: CN 2-12 grossly intact. Sensation intact, DTR normal. Strength 5/5 in all 4.    Psychiatric: Normal judgment and insight. Alert and oriented x 3. Normal mood.       Labs on Admission: I have personally reviewed following labs and imaging studies  CBC: Recent Labs  Lab 05/09/20 1104  WBC 6.4  HGB 10.6*  HCT 34.7*  MCV 85.7  PLT 825   Basic Metabolic Panel: Recent Labs  Lab 05/09/20 1104  NA 137  K 4.2  CL 100  CO2 26  GLUCOSE 109*  BUN 11  CREATININE 1.00  CALCIUM 9.5   GFR: Estimated Creatinine Clearance: 58.9 mL/min (by C-G formula based on SCr of 1 mg/dL). Liver Function Tests: No results for input(s): AST, ALT, ALKPHOS, BILITOT, PROT, ALBUMIN in the last 168 hours. No results for input(s): LIPASE, AMYLASE in the last 168 hours. No results for input(s): AMMONIA in the last 168 hours. Coagulation Profile:  No results for input(s): INR, PROTIME in the last 168 hours. Cardiac Enzymes: No results for input(s): CKTOTAL, CKMB, CKMBINDEX, TROPONINI in the last 168 hours. BNP (last 3 results) No results for input(s): PROBNP in the last 8760 hours. HbA1C: No results for input(s): HGBA1C in the last 72 hours. CBG: No results for input(s): GLUCAP in the last 168 hours. Lipid Profile: No results for input(s): CHOL, HDL, LDLCALC, TRIG, CHOLHDL, LDLDIRECT in the last 72 hours. Thyroid Function Tests: No results for input(s): TSH, T4TOTAL, FREET4, T3FREE, THYROIDAB in the last 72 hours. Anemia Panel: No results for input(s): VITAMINB12, FOLATE, FERRITIN, TIBC, IRON, RETICCTPCT in the last 72 hours. Urine analysis:    Component Value Date/Time   COLORURINE YELLOW 03/22/2020 1533   APPEARANCEUR CLEAR 03/22/2020 1533   LABSPEC >1.046 (H) 03/22/2020 1533   PHURINE 5.0 03/22/2020 1533   GLUCOSEU NEGATIVE 03/22/2020 1533   HGBUR SMALL (A) 03/22/2020 1533   BILIRUBINUR NEGATIVE 03/22/2020 [place] 03/22/2020 1533   PROTEINUR 30 (A) 03/22/2020 1533   UROBILINOGEN 0.2 03/01/2009 0923   NITRITE NEGATIVE 03/22/2020 1533   LEUKOCYTESUR  NEGATIVE 03/22/2020 1533    Radiological Exams on Admission: No results found.  EKG: Independently reviewed.  Chronic RBBB  Assessment/Plan Active Problems:   Empyema (HCC)  (please populate well all problems here in Problem List. (For example, if patient is on BP meds at home and you resume or decide to hold them, it is a problem that needs to be her. Same for CAD, COPD, HLD and so on)  Recurrent right-sided empyema, failed outpatient treatment -Given the patient symptoms started after completion of p.o. antibiotics, suspect treatment failure.  Agreed with pulmonary, patient may need VATS and reculture.  Restart Rocephin, as was shown to be effective on last admission, no plan for escalation antibiotics for now given no systemic symptoms or leukocytosis. -Pulmonology on board.  HTN -Continue home meds  GERD -On PPI  Remote right-sided lung CA -Has been tumor free for 11 years.  Pleural effusion analysis did not show malignant cells on her last admission.  CAD -No acute issue  DVT prophylaxis: Lovenox  code Status: Full Code Family Communication: None at bedside Disposition Plan: Recurrent empyema failed outpatient p.o. antibiotics, will need IV antibiotics and likely VATS Consults called: Pulmonology Admission status: MedSurg admission   Lequita Halt MD [facility] Pager [phone]  05/09/2020, 1:14 PM

## 2020-05-09 NOTE — H&P (View-Only) (Signed)
[address]       [place] [address]             [phone]        Rodert B Fahringer [place] Medical Record #[id number] Date of Birth: [birth year]  Referring: Wynetta Fines, MD Primary Care: [name], MD Primary Cardiologist:No primary care provider on file.  Chief Complaint:    Chief Complaint  Patient presents with  . 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  He had subsequent chemotherapy and radiation.  He is followed routinely by Dr. Earlie Server.  He was admitted to an outside hospital for 10 days in mid September of this year with a right-sided empyema that was managed primarily by the pulmonary medicine and internal medicine services with pleural drainage and IV antibiotics.   He was covered with a 3-week course of oral antibiotics following discharge for pansensitive strept intermedius cultured from the pleural drainage during that admission. During that admission in September, he was also incidentally found to be COVID positive.  He did not present with any Covid-related symptoms but was found to be positive on routine screening.  He was treated with oral steroids and remdesivir.  He reports that he lost his taste for about 3 weeks and as result lost about 20 pounds.  His taste has returned and his appetite is currently improving. Mathew Hall recently sought medical attention from his pulmonologist after discovering a "lump" on the right side of his lower back at the site where the previous chest tube has been placed.  He was seen by Dr. Freda Jackson 2 days ago and a CT scan of the chest was obtained.  This demonstrated recurrent right-sided posterior  loculated pleural effusion.  This appears to communicate with a fluid collection in the subcutaneous Hall corresponding to the lump the patient was sensing.  Mathew Hall presented to the ED earlier today for further evaluation.  He was found to have stable vital signs with no hypoxia and was afebrile.  His white blood count was 6400.  He denies having any shortness of breath, chills, or cough.  He does have some minor discomfort around the lump in his right back.   Mathew Hall is accompanied by his daughter today who was at the bedside during this interview.  He retired from [employer].   Current Activity/ Functional Status:    Zubrod Score: At the time of surgery this patient's most appropriate activity status/level should be described as: [x]     0    Normal activity, no symptoms []     1    Restricted in physical strenuous activity but ambulatory, able to do out light work []     2    Ambulatory and capable of self care, unable to do work activities, up and about                 more than 50%  Of the time                            []     3    Only limited self care, in bed greater than 50%  of waking hours []     4    Completely disabled, no self care, confined to bed or chair []     5    Moribund  Past Medical History:  Diagnosis Date  . Asthma   . CAD (coronary artery disease) 1999   stents to RCA, residual 40% Lt main, decreased EF40%   . Cataract    bilateral-beginning stages  . Colon polyps   . GERD (gastroesophageal reflux disease)   . H/O cardiovascular stress test 1999/2005/2008/2011   last with inf scar/infarct, EF 54%  . H/O echocardiogram 2008   EF 50-55%, Mild MR, Mild TR,    . Hemorrhoids   . 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 Hyperlipemia   . Hypertension   . lung ca dx'd 10/2008   chemo comp 01/2009-stage 4 lung cancer  . Myocardial infarction ([place]) 02-18-1998  . Peptic stricture of esophagus   . Right carotid bruit 04/01/12     minimal carotid stenosis on doppler    Past Surgical History:  Procedure Laterality Date  . ANGIOPLASTY     with 2 stent placement  . COLONOSCOPY    . CORONARY ANGIOPLASTY WITH STENT PLACEMENT  02/18/1998   PCI/stents to ostial and mid RCA, emergently, residual Lt main disease  . LUNG SURGERY     right  . POLYPECTOMY     HPP 09-21-08    Social History   Tobacco Use  Smoking Status Former Smoker  . Packs/day: 2.00  . Types: Cigarettes  . Quit date: 07/16/1997  . Years since quitting: 22.8  Smokeless Tobacco Never Used    Social History   Substance and Sexual Activity  Alcohol Use Yes   Comment: occasionally     Allergies  Allergen Reactions  . Lipitor [Atorvastatin Calcium]     Knee pain   . Atorvastatin Other (See Comments)    Joint/knee pain    Current Facility-Administered Medications  Medication Dose Route Frequency Provider Last Rate Last Admin  . 0.9 %  sodium chloride infusion  500 mL Intravenous Once [name], MD      . acetaminophen (TYLENOL) tablet 650 mg  650 mg Oral Q6H PRN Lequita Halt, MD       Or  . acetaminophen (TYLENOL) suppository 650 mg  650 mg Rectal Q6H PRN Wynetta Fines T, MD      . Derrill Memo ON 05/10/2020] amLODipine (NORVASC) tablet 5 mg  5 mg Oral Daily Wynetta Fines T, MD      . Derrill Memo ON 05/10/2020] aspirin EC tablet 325 mg  325 mg Oral Daily Wynetta Fines T, MD      . cefTRIAXone (ROCEPHIN) 1 g in sodium chloride 0.9 % 100 mL IVPB  1 g Intravenous Q24H Wynetta Fines T, MD      . enoxaparin (LOVENOX) injection 40 mg  40 mg Subcutaneous Q24H Wynetta Fines T, MD   40 mg at 05/09/20 1426  . metoprolol tartrate (LOPRESSOR) tablet 25 mg  25 mg Oral BID Wynetta Fines T, MD      . ondansetron [facility]) tablet 4 mg  4 mg Oral Q6H PRN Wynetta Fines T, MD       Or  . ondansetron [facility]) injection 4 mg  4 mg Intravenous Q6H PRN Wynetta Fines T, MD      . oxybutynin (DITROPAN) tablet 5 mg  5 mg Oral Q8H PRN Lequita Halt, MD      . oxyCODONE (Oxy  IR/ROXICODONE)  immediate release tablet 5 mg  5 mg Oral Q4H PRN Wynetta Fines T, MD      . Derrill Memo ON 05/10/2020] pantoprazole (PROTONIX) EC tablet 40 mg  40 mg Oral Daily Wynetta Fines T, MD      . potassium chloride SA (KLOR-CON) CR tablet 20 mEq  20 mEq Oral Daily Lequita Halt, MD      . Derrill Memo ON 05/11/2020] Vitamin D (Ergocalciferol) (DRISDOL) capsule 50,000 Units  50,000 Units Oral Weekly Lequita Halt, MD       Current Outpatient Medications  Medication Sig Dispense Refill  . amLODipine (NORVASC) 5 MG tablet Take 1 tablet by mouth daily.    Marland Kitchen aspirin 325 MG EC tablet Take 1 tablet (325 mg total) by mouth daily. Please hold for 15 days when you are on Eliquis, resume on 10/11 once you are off Eliquis.    Marland Kitchen ergocalciferol (VITAMIN D2) 1.25 MG (50000 UT) capsule Take 50,000 Units by mouth once a week. Friday    . esomeprazole (NEXIUM) 40 MG capsule Take 40 mg by mouth daily at 12 noon.    Marland Kitchen KLOR-CON M20 20 MEQ tablet Take 20 mEq by mouth daily.    . metoprolol tartrate (LOPRESSOR) 25 MG tablet Take 25 mg by mouth 2 (two) times daily.    . nitroGLYCERIN (NITROSTAT) 0.4 MG SL tablet Place 1 tablet (0.4 mg total) under the tongue every 5 (five) minutes as needed for chest pain. 90 tablet 3  . oxybutynin (DITROPAN) 5 MG tablet Take 5 mg by mouth every 8 (eight) hours as needed for bladder spasms.   2  . phenylephrine (SUDAFED PE) 10 MG TABS tablet Take 10 mg by mouth every 4 (four) hours as needed (sinus congestion).    . Prenatal Multivit-Min-Fe-FA (PRENATAL 1 + IRON PO) Take 1 tablet by mouth daily.      (Not in a hospital admission)   Family History  Problem Relation Age of Onset  . Healthy Sister   . Healthy Sister   . Stroke Mother   . Colon cancer Neg Hx   . Rectal cancer Neg Hx   . Stomach cancer Neg Hx      Review of Systems:   ROS     Cardiac Review of Systems: Y or  [    ]= no  Chest Pain [    ]  Resting SOB [   ] Exertional SOB  [  ]  Orthopnea [  ]   Pedal Edema [   ]      Palpitations [  ] Syncope  [  ]   Presyncope [   ]  General Review of Systems: [Y] = yes [  ]=no Constitional: recent weight change Blue.Reese  ]; anorexia [  ]; fatigue [  ]; nausea [  ]; night sweats [  ]; fever [  ]; or chills [  ]                                                                Eye : blurred vision [  ]; diplopia [   ]; vision changes [  ];  Amaurosis fugax[   Resp: cough [  ];  wheezing[  ];  hemoptysis[  ]; shortness of breath[  ];  paroxysmal nocturnal dyspnea[  ]; dyspnea on exertion[  ]; or orthopnea[  ];  GI:  gallstones[  ], vomiting[  ];  dysphagia[  ]; melena[  ];  hematochezia [  ]; heartburn[  ];   Hx of  Colonoscopy[  ]; GU: kidney stones [  ]; hematuria[  ];   dysuria [  ];  nocturia[  ];  history of     obstruction [  ]; urinary frequency [  ]             Skin: rash, swelling[  ];, hair loss[  ];  peripheral edema[  ];  or itching[  ]; Musculosketetal: myalgias[  ];  joint swelling[  ];  joint erythema[  ];  joint pain[  ];  back pain[  ];  Heme/Lymph: bruising[  ];  bleeding[  ];  anemia[  ];  Neuro: TIA[  ];  headaches[  ];  stroke[  ];  vertigo[  ];  seizures[  ];   paresthesias[  ];  difficulty walking[  ];  Psych:depression[  ]; anxiety[  ];  Endocrine: diabetes[  ];  thyroid dysfunction[  ];               Physical Exam: BP 124/67 (BP Location: Right Arm)   Pulse 63   Temp 97.9 F (36.6 C) (Oral)   Resp 16   SpO2 100%    General appearance: alert, cooperative and no distress Head: Normocephalic, without obvious abnormality, atraumatic Neck: no adenopathy, no carotid bruit, no JVD and supple, symmetrical, trachea midline Lymph nodes: Cervical, supraclavicular, and axillary nodes normal. and No obvious lymphadenopathy Resp: clear to auscultation bilaterally Back: symmetric, no curvature. ROM normal. No CVA tenderness., There is a cyst sized mass on the patient's back just below and lateral to the scapula. Cardio: Regular rate and rhythm, heart sounds are  distant, I did not hear murmur. GI: Abdomen is soft and nontender.  Normal bowel sounds. Extremities: All are well perfused, no edema peripherally. Neurologic: Grossly normal  Diagnostic Studies & Laboratory data:     Recent Radiology Findings:  EXAM: CHEST - 2 VIEW  COMPARISON:  04/23/2020  FINDINGS: Normal heart size. Postsurgical changes in volume loss are again noted within the right hemithorax. Loculated right pleural effusion is again noted and appears unchanged from 04/23/2020. No left pleural effusion. No acute airspace densities. Visualized osseous structures are unremarkable.  IMPRESSION: Unchanged loculated right pleural effusion.   Electronically Signed   By: Kerby Moors M.D.   On: 05/07/2020 12:15   I have independently reviewed the above radiologic studies and discussed with the patient   Recent Lab Findings: Lab Results  Component Value Date   WBC 6.4 05/09/2020   HGB 10.6 (L) 05/09/2020   HCT 34.7 (L) 05/09/2020   PLT 360 05/09/2020   GLUCOSE 109 (H) 05/09/2020   ALT 25 03/31/2020   AST 27 03/31/2020   NA 137 05/09/2020   K 4.2 05/09/2020   CL 100 05/09/2020   CREATININE 1.00 05/09/2020   BUN 11 05/09/2020   CO2 26 05/09/2020   TSH 1.228 03/27/2020   INR 1.0 03/01/2009   HGBA1C 5.6 03/23/2020      Assessment / Plan:      Mathew Hall is a pleasant 79 year old male with a history of bronchioloalveolar adenocarcinoma with right middle lobe lung resection 11 years ago and more recently status post pleural drainage for a right sided empyema.  He completed a full course of IV and  oral antibiotics for strept intermedius cultured from the pleural space back in September of this year.  He now presents after noting the development of a mass on his back just below and lateral to the right scapula.  He sought medical attention from his pulmonologist and was seen by Dr. Yvone Neu earlier this week.  A CT scan of the chest was obtained that  demonstrates a small to moderate loculated right posterior fluid collection that appears to communicate with the mass in the subcutaneous Hall overlying the chest wall posteriorly.  Mathew Hall is being admitted to the hospital by the internal medicine service.  He has been started back on Rocephin since the previously cultured staph intermedius was pansensitive.  We will tentatively plan for surgical evacuation of the loculated fluid collection of the right posterior pleural space as well as the soft Hall fluid collection in the operating room on Friday, 05/11/2020.  Mathew Hall is currently in no acute distress and denies having any significant pain.  He is well oxygenated on room air.  He and his daughter agree with this plan of management.  Mathew Hall will be evaluated later by Dr. Roxan Hockey.    I  spent 20 minutes counseling the patient face to face.   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 Recent COVID infection and pneumonia complicated by empyema. Drained with tube and treated with antibiotics with improvement of symptoms initially.  Now with 2 week history of "lump" on back. No fever or chills. CT shows an organized empyema necessitans. WIll require antibiotics and drainage.   Will discuss with IR. However, I think this will likely require open surgical drainage.  Agree with Unasyn  Remo Lipps C. Roxan Hockey, MD [facility] [phone]

## 2020-05-09 NOTE — ED Triage Notes (Signed)
Patient arrives to ED with complaints of a "knot om his back and continued shortness of breath. Had CT chest yesterday concerning for empyema. The "lump" or "knot" located right posterior thorax is where the chest tube was placed back in his last admission. Pt sent here by PCP for complicated pleural space.

## 2020-05-09 NOTE — ED Notes (Signed)
Pt clarified that he does not have Sob and has not had any SOB. Pt's main complaint is the abscess on the right mid scapula area. Pt. Pt. States affected area is sore but denies overall Pain.

## 2020-05-09 NOTE — Consult Note (Signed)
NAME:  Mathew Hall, MRN:  [id number], DOB:  06-Aug-1940, LOS: 0 ADMISSION DATE:  05/09/2020, CONSULTATION DATE:  05/09/20 REFERRING MD:  EDP CHIEF COMPLAINT:  empyema   Brief History   79yo male presented with recurrent shortness of breath and concerns for recurrent empyema. Patient was recently admitted 03/21/20 - 03/31/20 for empyema of right pleural space, she was treated with pigtail chest tube and Rocephin and Keflex for Streptococcus intermedius.   History of present illness   Mathew Hall is a 79yo male with PMX significant for Empyema, former smoker, right adenocarcinoma s/p partial resection and chemo, asthma, CAD S/P PCI, colon polyps, and GERD who presented to the ED at the recommendation of his primary Pulmonologist Dr. Rudi Heap. Patient was seen by Dr. Erin Fulling 11/1 who felt the patient had developed recurrent loculated effusion on the right with concerns of unresolved empyema from the initial presentation. Repeat imaging confirms recurrence. CT chest 11/2 is concerning for empyema with connection to the chest wall through the previous chest tube tract which would explain presence of right chest wall lump.   Case was discussed with cardiothoracic surgery and decision was made admit patient to the hospitalitis service with plan for surgery Friday. PCCM will be available as consult.     Past Medical History  MI 1999 Adenocarcinoma of right lung HTN HLD GERD CAD Asthma   Significant Hospital Events   Admit 11/3  Consults:  CTS PCCM  Procedures:    Significant Diagnostic Tests:  11/3 CT chest>>  Micro Data:  11/3 Covid-19, flu>> 11/3 BCx2>>  Antimicrobials:  Ceftriaxone 11/3 Unasyn 11/4-   Interim history/subjective:  As above  Objective   Blood pressure 124/67, pulse 63, temperature 97.9 F (36.6 C), temperature source Oral, resp. rate 16, SpO2 100 %.        Intake/Output Summary (Last 24 hours) at 05/09/2020 1716 Last data filed at 05/09/2020  1524 Gross per 24 hour  Intake 100 ml  Output --  Net 100 ml   There were no vitals filed for this visit.  General:  Elderly M, alert, oriented, no distress, non-toxic appearing HEENT: MM pink/moist Neuro: awake, alert and oriented x4 CV: s1s2 rrr, no m/r/g PULM:  Clear bilaterally on RA, large R mid-back mass, firm without erythema, induration or fluctuance, minimally tender GI: soft, bsx4 active  Extremities: warm/dry, no edema  Skin: no rashes or lesions   Resolved Hospital Problem list     Assessment & Plan:    R-sided Empyema and posterior mass in the setting of recent Covid PNA and chest tube Pt clinically stable, but not improving with outpatient treatment, previously grew pan-sensitive staph intermedius, treated with Rocephin P: -Admit to internal medicine, CT surgery consulting and plan for surgical evacuation of fluid loculation and R posterior pleural space on 11/5 -broaden to Unasyn for gram negative coverage  -PCCM will continue to follow along, please contact for any concerns or clinical worsening       Labs   CBC: Recent Labs  Lab 05/09/20 1104  WBC 6.4  HGB 10.6*  HCT 34.7*  MCV 85.7  PLT 102    Basic Metabolic Panel: Recent Labs  Lab 05/09/20 1104  NA 137  K 4.2  CL 100  CO2 26  GLUCOSE 109*  BUN 11  CREATININE 1.00  CALCIUM 9.5   GFR: Estimated Creatinine Clearance: 58.9 mL/min (by C-G formula based on SCr of 1 mg/dL). Recent Labs  Lab 05/09/20 1104  WBC 6.4  Liver Function Tests: No results for input(s): AST, ALT, ALKPHOS, BILITOT, PROT, ALBUMIN in the last 168 hours. No results for input(s): LIPASE, AMYLASE in the last 168 hours. No results for input(s): AMMONIA in the last 168 hours.  ABG    Component Value Date/Time   PHART 7.429 03/06/2009 0437   PCO2ART 36.0 03/06/2009 0437   PO2ART 62.0 (L) 03/06/2009 0437   HCO3 23.9 03/06/2009 0437   TCO2 25 03/06/2009 0437   ACIDBASEDEF 0.5 03/01/2009 0922   O2SAT 93.0  03/06/2009 0437     Coagulation Profile: No results for input(s): INR, PROTIME in the last 168 hours.  Cardiac Enzymes: No results for input(s): CKTOTAL, CKMB, CKMBINDEX, TROPONINI in the last 168 hours.  HbA1C: Hgb A1c MFr Bld  Date/Time Value Ref Range Status  03/23/2020 09:03 AM 5.6 4.8 - 5.6 % Final    Comment:    (NOTE) Pre diabetes:          5.7%-6.4%  Diabetes:              >6.4%  Glycemic control for   <7.0% adults with diabetes     CBG: No results for input(s): GLUCAP in the last 168 hours.  Review of Systems:   Negative except as noted in HPI  Past Medical History  He,  has a past medical history of Asthma, CAD (coronary artery disease) (1999), Cataract, Colon polyps, GERD (gastroesophageal reflux disease), H/O cardiovascular stress test (1999/2005/2008/2011), H/O echocardiogram (2008), Hemorrhoids, acute myocardial infarction of inferior wall (02/18/1998), Hyperlipemia, Hypertension, lung ca (dx'd 10/2008), Myocardial infarction ([place]) (02-18-1998), Peptic stricture of esophagus, and Right carotid bruit (04/01/12).   Surgical History    Past Surgical History:  Procedure Laterality Date  . ANGIOPLASTY     with 2 stent placement  . COLONOSCOPY    . CORONARY ANGIOPLASTY WITH STENT PLACEMENT  02/18/1998   PCI/stents to ostial and mid RCA, emergently, residual Lt main disease  . LUNG SURGERY     right  . POLYPECTOMY     HPP 09-21-08     Social History   reports that he quit smoking about 22 years ago. His smoking use included cigarettes. He smoked 2.00 packs per day. He has never used smokeless tobacco. He reports current alcohol use. He reports that he does not use drugs.   Family History   His family history includes Healthy in his sister and sister; Stroke in his mother. There is no history of Colon cancer, Rectal cancer, or Stomach cancer.   Allergies Allergies  Allergen Reactions  . Lipitor [Atorvastatin Calcium]     Knee pain   . Atorvastatin Other  (See Comments)    Joint/knee pain     Home Medications  Prior to Admission medications   Medication Sig Start Date End Date Taking? Authorizing Provider  amLODipine (NORVASC) 5 MG tablet Take 1 tablet by mouth daily. 08/30/18  Yes [provider]  aspirin 325 MG EC tablet Take 1 tablet (325 mg total) by mouth daily. Please hold for 15 days when you are on Eliquis, resume on 10/11 once you are off Eliquis. 04/16/20  Yes Elgergawy, Silver Huguenin, MD  ergocalciferol (VITAMIN D2) 1.25 MG (50000 UT) capsule Take 50,000 Units by mouth once a week. Friday   Yes [provider]  esomeprazole (NEXIUM) 40 MG capsule Take 40 mg by mouth daily at 12 noon.   Yes [provider]  KLOR-CON M20 20 MEQ tablet Take 20 mEq by mouth daily. 04/13/20  Yes  [provider]  metoprolol tartrate (LOPRESSOR) 25 MG tablet Take 25 mg by mouth 2 (two) times daily.   Yes [provider]  nitroGLYCERIN (NITROSTAT) 0.4 MG SL tablet Place 1 tablet (0.4 mg total) under the tongue every 5 (five) minutes as needed for chest pain. 09/01/17  Yes Lorretta Harp, MD  oxybutynin (DITROPAN) 5 MG tablet Take 5 mg by mouth every 8 (eight) hours as needed for bladder spasms.  01/02/15  Yes [provider]  phenylephrine (SUDAFED PE) 10 MG TABS tablet Take 10 mg by mouth every 4 (four) hours as needed (sinus congestion).   Yes [provider]  Prenatal Multivit-Min-Fe-FA (PRENATAL 1 + IRON PO) Take 1 tablet by mouth daily.   Yes [provider]     Critical care time: 35 minutes     Otilio Carpen Koya Hunger, PA-C [place] P[facility]M  Pager# [phone], if no answer [phone]

## 2020-05-09 NOTE — ED Provider Notes (Signed)
[facility] Provider Note   CSN: [id number] Arrival date & time: 05/09/20  1017     History Chief Complaint  Patient presents with  . Empyema    [facility] Hall is a 79 y.o. male.  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  Patient had recurrent pain in the right mid back followed by development of a fluid collection and lump over the right mid back in the area of previous chest tube.  Patient denies fever, shortness of breath, cough.  Breathing is at baseline.  He states that he was called early this morning and told to come to the hospital.  Dr. Roxan Hall of cardiothoracic surgery is aware of patient.        Past Medical History:  Diagnosis Date  . Asthma   . CAD (coronary artery disease) 1999   stents to RCA, residual 40% Lt main, decreased EF40%   . Cataract    bilateral-beginning stages  . Colon polyps   . GERD (gastroesophageal reflux disease)   . H/O cardiovascular stress test 1999/2005/2008/2011   last with inf scar/infarct, EF 54%  . H/O echocardiogram 2008   EF 50-55%, Mild MR, Mild TR,    . Hemorrhoids   . 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 Hyperlipemia   . Hypertension   . lung ca dx'd 10/2008   chemo comp 01/2009-stage 4 lung cancer  . Myocardial infarction ([place]) 02-18-1998  . Peptic stricture of esophagus   . Right carotid bruit 04/01/12   minimal carotid stenosis on doppler    Patient Active Problem List   Diagnosis Date Noted  . Chest tube in place   . Empyema ([place])   . Loculated pleural effusion   .  History of COVID-19 03/22/2020  . CAD (coronary artery disease), emergent stent to ostial RCA and mid RCA with an acute inferior MI in 1999, residual left main disease 40% at that time, last week's study 2011 with inferior scar no isc   . Primary cancer of right middle lobe of lung ([place]) 12/05/2008  . C O P D 10/27/2008  . HYPERLIPIDEMIA 10/10/2008  . Essential hypertension 10/10/2008  . Acute myocardial infarction ([place]) 10/10/2008  . PERSONAL HX COLONIC POLYPS 09/05/2008  . 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 HEMORRHOIDS, INTERNAL 09/04/2008  . ESOPHAGEAL STRICTURE 09/04/2008  . GERD 09/04/2008    Past Surgical History:  Procedure Laterality Date  . ANGIOPLASTY     with 2 stent placement  . COLONOSCOPY    . CORONARY ANGIOPLASTY WITH STENT PLACEMENT  02/18/1998   PCI/stents to ostial and mid RCA, emergently, residual Lt main disease  . LUNG SURGERY     right  . POLYPECTOMY     HPP 09-21-08       Family History  Problem Relation Age of Onset  . Healthy Sister   . Healthy Sister   . Stroke Mother   . Colon cancer Neg Hx   . Rectal cancer Neg Hx   . Stomach cancer Neg Hx     Social History   Tobacco Use  . Smoking status: Former Smoker    Packs/day:  2.00    Types: Cigarettes    Quit date: 07/16/1997    Years since quitting: 22.8  . Smokeless tobacco: Never Used  Vaping Use  . Vaping Use: Never used  Substance Use Topics  . Alcohol use: Yes    Comment: occasionally  . Drug use: No    Home Medications Prior to Admission medications   Medication Sig Start Date End Date Taking? Authorizing Provider  amLODipine (NORVASC) 5 MG tablet Take 1 tablet by mouth daily. 08/30/18   [provider]  aspirin 325 MG EC tablet Take 1 tablet (325 mg total) by mouth daily. Please hold for 15 days when you are on Eliquis, resume on 10/11 once you are off Eliquis. 04/16/20   Elgergawy, Silver Huguenin, MD  ergocalciferol (VITAMIN D2) 1.25 MG (50000 UT) capsule Take 50,000 Units  by mouth once a week.    [provider]  esomeprazole (NEXIUM) 40 MG capsule Take 40 mg by mouth daily at 12 noon.    [provider]  KLOR-CON M20 20 MEQ tablet Take 20 mEq by mouth daily. 04/13/20   [provider]  metoprolol tartrate (LOPRESSOR) 25 MG tablet Take 25 mg by mouth 2 (two) times daily.    [provider]  nitroGLYCERIN (NITROSTAT) 0.4 MG SL tablet Place 1 tablet (0.4 mg total) under the tongue every 5 (five) minutes as needed for chest pain. 09/01/17   Lorretta Harp, MD  oxybutynin (DITROPAN) 5 MG tablet Take 5 mg by mouth every 8 (eight) hours as needed for bladder spasms.  01/02/15   [provider]  Prenatal Multivit-Min-Fe-FA (PRENATAL 1 + IRON PO) Take 1 tablet by mouth daily.    [provider]    Allergies    Lipitor [atorvastatin calcium] and Atorvastatin  Review of Systems   Review of Systems  Constitutional: Negative for fever.  HENT: Negative for rhinorrhea and sore throat.   Eyes: Negative for redness.  Respiratory: Negative for cough and shortness of breath.   Cardiovascular: Positive for chest pain (lateral).  Gastrointestinal: Negative for abdominal pain, diarrhea, nausea and vomiting.  Genitourinary: Negative for dysuria and hematuria.  Musculoskeletal: Positive for back pain. Negative for myalgias.  Skin: Negative for color change and rash.       +swelling right lateral mid back  Neurological: Negative for headaches.    Physical Exam Updated Vital Signs BP 124/67 (BP Location: Right Arm)   Pulse 63   Temp 97.9 F (36.6 C) (Oral)   Resp 16   SpO2 100%   Physical Exam Vitals and nursing note reviewed.  Constitutional:      Appearance: He is well-developed.  HENT:     Head: Normocephalic and atraumatic.  Eyes:     General:        Right eye: No discharge.        Left eye: No discharge.     Conjunctiva/sclera: Conjunctivae normal.  Cardiovascular:     Rate and Rhythm: Normal rate and  regular rhythm.     Heart sounds: Normal heart sounds.  Pulmonary:     Effort: Pulmonary effort is normal.     Breath sounds: Examination of the right-middle field reveals decreased breath sounds. Examination of the right-lower field reveals decreased breath sounds. Decreased breath sounds present.  Abdominal:     Palpations: Abdomen is soft.     Tenderness: There is no abdominal tenderness.  Musculoskeletal:     Cervical back: Normal range of motion and neck supple.  Comments: Patient with nontender area of swelling, measuring approximately 4 cm in diameter noted to the right mid back.  No overlying erythema.  Skin:    General: Skin is warm and dry.  Neurological:     Mental Status: He is alert.     ED Results / Procedures / Treatments   Labs (all labs ordered are listed, but only abnormal results are displayed) Labs Reviewed  BASIC METABOLIC PANEL - Abnormal; Notable for the following components:      Result Value   Glucose, Bld 109 (*)    All other components within normal limits  CBC - Abnormal; Notable for the following components:   RBC 4.05 (*)    Hemoglobin 10.6 (*)    HCT 34.7 (*)    All other components within normal limits    EKG EKG Interpretation  Date/Time:  Wednesday May 09 2020 11:00:43 EDT Ventricular Rate:  59 PR Interval:  164 QRS Duration: 122 QT Interval:  428 QTC Calculation: 423 R Axis:   89 Text Interpretation: Sinus bradycardia Right bundle branch block Possible Inferior infarct , age undetermined Abnormal ECG When compared to prior, less pvc but more wandering baseline. no STEMI Confirmed by Antony Blackbird [phone]) on 05/09/2020 11:59:24 AM   Radiology DG Chest 2 View  Result Date: 05/07/2020 CLINICAL DATA:  Follow-up empyema EXAM: CHEST - 2 VIEW COMPARISON:  04/23/2020 FINDINGS: Normal heart size. Postsurgical changes in volume loss are again noted within the right hemithorax. Loculated right pleural effusion is again noted and appears  unchanged from 04/23/2020. No left pleural effusion. No acute airspace densities. Visualized osseous structures are unremarkable. IMPRESSION: Unchanged loculated right pleural effusion. Electronically Signed   By: Kerby Moors M.D.   On: 05/07/2020 12:15    Procedures Procedures (including critical care time)  Medications Ordered in ED Medications - No data to display  ED Course  I have reviewed the triage vital signs and the nursing notes.  Pertinent labs & imaging results that were available during my care of the patient were reviewed by me and considered in my medical decision making (see chart for details).  Patient seen and examined. Work-up initiated.   Vital signs reviewed and are as follows: BP 124/67 (BP Location: Right Arm)   Pulse 63   Temp 97.9 F (36.6 C) (Oral)   Resp 16   SpO2 100%   I spoke with Thurmond Butts of CT surgery.  CT surgery plans to consult, Dr. Roxan Hall will see later today after his OR schedule.  They request medicine admission.  Plan for surgery on Friday.  Positive COVID PCR 03/22/20, serology reactive 03/28/20.   12:41 PM Labs reviewed. Will request admission.   1:00 PM Spoke with Dr. Roosevelt Locks who will see patient.    MDM Rules/Calculators/A&P                          Admit.   Final Clinical Impression(s) / ED Diagnoses Final diagnoses:  Empyema with pleural fistula [facility])    Rx / DC Orders ED Discharge Orders    None       [place] Cater, PA-C 05/09/20 1300    Tegeler, Gwenyth Allegra, MD 05/09/20 [phone]

## 2020-05-10 ENCOUNTER — Telehealth: Payer: Self-pay | Admitting: Pulmonary Disease

## 2020-05-10 DIAGNOSIS — J869 Pyothorax without fistula: Secondary | ICD-10-CM | POA: Diagnosis not present

## 2020-05-10 DIAGNOSIS — J860 Pyothorax with fistula: Secondary | ICD-10-CM | POA: Diagnosis not present

## 2020-05-10 DIAGNOSIS — E43 Unspecified severe protein-calorie malnutrition: Secondary | ICD-10-CM | POA: Insufficient documentation

## 2020-05-10 LAB — APTT: aPTT: 45 seconds — ABNORMAL HIGH (ref 24–36)

## 2020-05-10 LAB — CBC
HCT: 29.8 % — ABNORMAL LOW (ref 39.0–52.0)
HCT: 30.4 % — ABNORMAL LOW (ref 39.0–52.0)
Hemoglobin: 9.3 g/dL — ABNORMAL LOW (ref 13.0–17.0)
Hemoglobin: 9.5 g/dL — ABNORMAL LOW (ref 13.0–17.0)
MCH: 26.1 pg (ref 26.0–34.0)
MCH: 26 pg (ref 26.0–34.0)
MCHC: 31.2 g/dL (ref 30.0–36.0)
MCHC: 31.3 g/dL (ref 30.0–36.0)
MCV: 83.5 fL (ref 80.0–100.0)
MCV: 83.3 fL (ref 80.0–100.0)
Platelets: 290 10*3/uL (ref 150–400)
Platelets: 303 10*3/uL (ref 150–400)
RBC: 3.57 MIL/uL — ABNORMAL LOW (ref 4.22–5.81)
RBC: 3.65 MIL/uL — ABNORMAL LOW (ref 4.22–5.81)
RDW: 14.7 % (ref 11.5–15.5)
RDW: 14.8 % (ref 11.5–15.5)
WBC: 6.1 10*3/uL (ref 4.0–10.5)
WBC: 5.7 10*3/uL (ref 4.0–10.5)
nRBC: 0 % (ref 0.0–0.2)
nRBC: 0 % (ref 0.0–0.2)

## 2020-05-10 LAB — URINALYSIS, ROUTINE W REFLEX MICROSCOPIC
Bilirubin Urine: NEGATIVE
Glucose, UA: NEGATIVE mg/dL
Hgb urine dipstick: NEGATIVE
Ketones, ur: NEGATIVE mg/dL
Leukocytes,Ua: NEGATIVE
Nitrite: NEGATIVE
Protein, ur: NEGATIVE mg/dL
Specific Gravity, Urine: 1.021 (ref 1.005–1.030)
pH: 6 (ref 5.0–8.0)

## 2020-05-10 LAB — BLOOD GAS, ARTERIAL
Acid-Base Excess: 0.6 mmol/L (ref 0.0–2.0)
Bicarbonate: 24 mmol/L (ref 20.0–28.0)
Drawn by: 35849
FIO2: 21
O2 Saturation: 97.4 %
Patient temperature: 37
pCO2 arterial: 33.6 mmHg (ref 32.0–48.0)
pH, Arterial: 7.467 — ABNORMAL HIGH (ref 7.350–7.450)
pO2, Arterial: 92.6 mmHg (ref 83.0–108.0)

## 2020-05-10 LAB — COMPREHENSIVE METABOLIC PANEL
ALT: 14 U/L (ref 0–44)
AST: 17 U/L (ref 15–41)
Albumin: 2.7 g/dL — ABNORMAL LOW (ref 3.5–5.0)
Alkaline Phosphatase: 65 U/L (ref 38–126)
Anion gap: 10 (ref 5–15)
BUN: 19 mg/dL (ref 8–23)
CO2: 26 mmol/L (ref 22–32)
Calcium: 9.3 mg/dL (ref 8.9–10.3)
Chloride: 100 mmol/L (ref 98–111)
Creatinine, Ser: 0.97 mg/dL (ref 0.61–1.24)
GFR, Estimated: >60 mL/min (ref >60)
Glucose, Bld: 104 mg/dL — ABNORMAL HIGH (ref 70–99)
Potassium: 4 mmol/L (ref 3.5–5.1)
Sodium: 136 mmol/L (ref 135–145)
Total Bilirubin: 0.5 mg/dL (ref 0.3–1.2)
Total Protein: 7.2 g/dL (ref 6.5–8.1)

## 2020-05-10 LAB — PROTIME-INR
INR: 1.2 (ref 0.8–1.2)
Prothrombin Time: 14.2 seconds (ref 11.4–15.2)

## 2020-05-10 LAB — TYPE AND SCREEN
ABO/RH(D): O POS
Antibody Screen: NEGATIVE

## 2020-05-10 MED ORDER — ADULT MULTIVITAMIN W/MINERALS CH
1.0000 | ORAL_TABLET | Freq: Every day | ORAL | Status: DC
  Administered 2020-05-10 – 2020-05-28 (×16): 1 via ORAL
  Filled 2020-05-10 (×16): qty 1

## 2020-05-10 MED ORDER — OXYCODONE HCL 5 MG PO TABS
5.0000 mg | ORAL_TABLET | ORAL | Status: DC | PRN
  Administered 2020-05-11 (×2): 5 mg via ORAL
  Filled 2020-05-10 (×2): qty 1

## 2020-05-10 MED ORDER — ENSURE ENLIVE PO LIQD
237.0000 mL | Freq: Two times a day (BID) | ORAL | Status: DC
  Administered 2020-05-11 – 2020-05-22 (×12): 237 mL via ORAL

## 2020-05-10 MED ORDER — SODIUM CHLORIDE 0.9 % IV SOLN
1.5000 g | Freq: Four times a day (QID) | INTRAVENOUS | Status: DC
  Administered 2020-05-10 – 2020-05-25 (×56): 1.5 g via INTRAVENOUS
  Filled 2020-05-10 (×2): qty 1.5
  Filled 2020-05-10 (×4): qty 4
  Filled 2020-05-10: qty 1.5
  Filled 2020-05-10 (×4): qty 4
  Filled 2020-05-10 (×3): qty 1.5
  Filled 2020-05-10 (×5): qty 4
  Filled 2020-05-10: qty 1.5
  Filled 2020-05-10 (×13): qty 4
  Filled 2020-05-10: qty 1.5
  Filled 2020-05-10: qty 4
  Filled 2020-05-10: qty 1.5
  Filled 2020-05-10 (×5): qty 4
  Filled 2020-05-10: qty 1.5
  Filled 2020-05-10: qty 4
  Filled 2020-05-10 (×2): qty 1.5
  Filled 2020-05-10: qty 4
  Filled 2020-05-10 (×2): qty 1.5
  Filled 2020-05-10: qty 4
  Filled 2020-05-10: qty 1.5
  Filled 2020-05-10: qty 4
  Filled 2020-05-10 (×2): qty 1.5
  Filled 2020-05-10 (×3): qty 4
  Filled 2020-05-10 (×4): qty 1.5
  Filled 2020-05-10: qty 4
  Filled 2020-05-10 (×2): qty 1.5
  Filled 2020-05-10: qty 4
  Filled 2020-05-10 (×2): qty 1.5
  Filled 2020-05-10: qty 4
  Filled 2020-05-10: qty 1.5
  Filled 2020-05-10: qty 4

## 2020-05-10 MED ORDER — VANCOMYCIN HCL IN DEXTROSE 1-5 GM/200ML-% IV SOLN
1000.0000 mg | INTRAVENOUS | Status: AC
  Administered 2020-05-11: 1000 mg via INTRAVENOUS
  Filled 2020-05-10: qty 200

## 2020-05-10 MED ORDER — HYDROCODONE-ACETAMINOPHEN 5-325 MG PO TABS: 1.0000 | ORAL_TABLET | Freq: Four times a day (QID) | ORAL | Status: DC | PRN

## 2020-05-10 MED ORDER — HYDROCODONE-ACETAMINOPHEN 5-325 MG PO TABS
1.0000 | ORAL_TABLET | Freq: Four times a day (QID) | ORAL | Status: DC | PRN
  Administered 2020-05-12 – 2020-05-13 (×2): 1 via ORAL
  Filled 2020-05-10 (×2): qty 1

## 2020-05-10 MED ORDER — SENNOSIDES-DOCUSATE SODIUM 8.6-50 MG PO TABS
1.0000 | ORAL_TABLET | Freq: Two times a day (BID) | ORAL | Status: DC
  Administered 2020-05-10 – 2020-05-13 (×7): 1 via ORAL
  Filled 2020-05-10 (×8): qty 1

## 2020-05-10 NOTE — Telephone Encounter (Signed)
[facility] and spoke with Truman Hayward in regards to pt's CT chest.  IMPRESSION: 1. Interval loculated pleural fluid posteriorly on the right with a surrounding rim of probably enhancing soft tissue and containing gas or air, suspicious for empyema. 2. The loculated right posterior pleural fluid collection has a lateral extension into the right posterolateral chest wall at the location of the entry tract of the previously demonstrated right chest tube with a heterogeneous area of soft tissue thickening and small fluid collections with extensive adjacent soft tissue edema and stranding. This is compatible with an area of infection with probable small abscesses. 3. Small hiatal hernia. 4. Cholelithiasis. 5. Calcific coronary artery and aortic atherosclerosis.   Routing to Dr. Erin Fulling.

## 2020-05-10 NOTE — H&P (View-Only) (Signed)
      [address]       [place] [address]             [phone]     Subjective: Some discomfort but not severe pain at site  Objective: Vital signs in last 24 hours: Temp:  [97.7 F (36.5 C)-98.5 F (36.9 C)] 98.5 F (36.9 C) (11/04 0827) Pulse Rate:  [57-68] 58 (11/04 0827) Resp:  [17-18] 18 (11/04 0827) BP: (118-121)/(57-68) 119/68 (11/04 0827) SpO2:  [98 %-100 %] 100 % (11/04 0827) Weight:  [66.5 kg] 66.5 kg (11/04 0731)  Hemodynamic parameters for last 24 hours:    Intake/Output from previous day: 11/03 0701 - 11/04 0700 In: 100 [IV Piggyback:100] Out: -  Intake/Output this shift: No intake/output data recorded.  General appearance: alert, cooperative and no distress Neurologic: intact Heart: regular rate and rhythm Lungs: diminished breath sounds right bawse posteriorly chest wall fluid collection  Lab Results: Recent Labs    05/09/20 1104 05/10/20 0131  WBC 6.4 6.1  HGB 10.6* 9.3*  HCT 34.7* 29.8*  PLT 360 290   BMET:  Recent Labs    05/09/20 1104  NA 137  K 4.2  CL 100  CO2 26  GLUCOSE 109*  BUN 11  CREATININE 1.00  CALCIUM 9.5    PT/INR: No results for input(s): LABPROT, INR in the last 72 hours. ABG    Component Value Date/Time   PHART 7.429 03/06/2009 0437   HCO3 23.9 03/06/2009 0437   TCO2 25 03/06/2009 0437   ACIDBASEDEF 0.5 03/01/2009 0922   O2SAT 93.0 03/06/2009 0437   CBG (last 3)  No results for input(s): GLUCAP in the last 72 hours.  Assessment/Plan: S/P  Empyema necessitans right chest  Reviewed CT images again. I do not think there is any way we will get an acceptable result with tube drainage. Best option is a limited right thoracotomy for drainage of the chest wall collection and the pleural collection. Will decorticate if that appears to be prudent at the time of surgery but will probably be looking at a modified Eloesser/ Claggett type procedure with a wound VAC. He understands we may need to  resect part of one or two ribs depending on intraoperative findings  I discussed the general nature of the procedure, including the need for general anesthesia, the incisions to be used, the possibility of a tube and/ or VAC and the possible need for repeat anesthesia for VAC changes.  We discussed the expected hospital stay, overall recovery and short and long term outcomes. I informed him of the indications, risks, benefits and alternatives.  He understands the risks include, but are not limited to death, stroke, MI, DVT/PE, bleeding, possible need for transfusion, infections, air leaks, as well as other organ system dysfunction including respiratory, renal, or GI complications.   He accepts the risks and agrees to proceed.  For OR in AM  [place] C. Roxan Hockey, MD [facility] [phone]    LOS: 1 day    [name] 05/10/2020

## 2020-05-10 NOTE — Progress Notes (Signed)
      [address]       [place] [address]             [phone]     Subjective: Some discomfort but not severe pain at site  Objective: Vital signs in last 24 hours: Temp:  [97.7 F (36.5 C)-98.5 F (36.9 C)] 98.5 F (36.9 C) (11/04 0827) Pulse Rate:  [57-68] 58 (11/04 0827) Resp:  [17-18] 18 (11/04 0827) BP: (118-121)/(57-68) 119/68 (11/04 0827) SpO2:  [98 %-100 %] 100 % (11/04 0827) Weight:  [66.5 kg] 66.5 kg (11/04 0731)  Hemodynamic parameters for last 24 hours:    Intake/Output from previous day: 11/03 0701 - 11/04 0700 In: 100 [IV Piggyback:100] Out: -  Intake/Output this shift: No intake/output data recorded.  General appearance: alert, cooperative and no distress Neurologic: intact Heart: regular rate and rhythm Lungs: diminished breath sounds right bawse posteriorly chest wall fluid collection  Lab Results: Recent Labs    05/09/20 1104 05/10/20 0131  WBC 6.4 6.1  HGB 10.6* 9.3*  HCT 34.7* 29.8*  PLT 360 290   BMET:  Recent Labs    05/09/20 1104  NA 137  K 4.2  CL 100  CO2 26  GLUCOSE 109*  BUN 11  CREATININE 1.00  CALCIUM 9.5    PT/INR: No results for input(s): LABPROT, INR in the last 72 hours. ABG    Component Value Date/Time   PHART 7.429 03/06/2009 0437   HCO3 23.9 03/06/2009 0437   TCO2 25 03/06/2009 0437   ACIDBASEDEF 0.5 03/01/2009 0922   O2SAT 93.0 03/06/2009 0437   CBG (last 3)  No results for input(s): GLUCAP in the last 72 hours.  Assessment/Plan: S/P  Empyema necessitans right chest  Reviewed CT images again. I do not think there is any way we will get an acceptable result with tube drainage. Best option is a limited right thoracotomy for drainage of the chest wall collection and the pleural collection. Will decorticate if that appears to be prudent at the time of surgery but will probably be looking at a modified Eloesser/ Claggett type procedure with a wound VAC. He understands we may need to  resect part of one or two ribs depending on intraoperative findings  I discussed the general nature of the procedure, including the need for general anesthesia, the incisions to be used, the possibility of a tube and/ or VAC and the possible need for repeat anesthesia for VAC changes.  We discussed the expected hospital stay, overall recovery and short and long term outcomes. I informed him of the indications, risks, benefits and alternatives.  He understands the risks include, but are not limited to death, stroke, MI, DVT/PE, bleeding, possible need for transfusion, infections, air leaks, as well as other organ system dysfunction including respiratory, renal, or GI complications.   He accepts the risks and agrees to proceed.  For OR in AM  [place] C. Roxan Hockey, MD [facility] [phone]    LOS: 1 day    [name] 05/10/2020

## 2020-05-10 NOTE — Progress Notes (Signed)
Initial Nutrition Assessment  DOCUMENTATION CODES:   Severe malnutrition in context of acute illness/injury  INTERVENTION:   -Ensure Enlive po BID, each supplement provides 350 kcal and 20 grams of protein -MVI with minerals daily  NUTRITION DIAGNOSIS:   Severe Malnutrition related to acute illness (recurrent rt empyema) as evidenced by percent weight loss, moderate fat depletion, severe fat depletion, severe muscle depletion, moderate muscle depletion.  GOAL:   Patient will meet greater than or equal to 90% of their needs  MONITOR:   PO intake, Supplement acceptance, Labs, Weight trends, Skin, I & O's  REASON FOR ASSESSMENT:   Malnutrition Screening Tool    ASSESSMENT:   [facility] is a 79 y.o. male with medical history significant of right-sided empyema recently September 16, remote lung CA on right side, recent COVID infection in September 2021, hypertension, GERD, presented with recurrent right-sided empyema.  Pt admitted with recurrent rt sided empyema.   Reviewed I/O's: +100 ml x 24 hours  Per CVTS notes, plan for possible surgical evacuation tomorrow (05/11/20).   Spoke with pt and wife at bedside. Both report that pt was discharged from the hospital about 7 weeks ago with an empyema. Pt shares that he had a poor appetite during previous hospitalization and about 2 weeks after, due to "food having no taste- everything tastes like cardboard". Pt wife started preparing meals that have strong flavors- such as tuna, garlic and cream cheese spread and cajun shrimp. Per pt, intake has improved greatly after eating more highly seasoned foods. He estimated he consumed almost all of his breakfast this morning.   Per pt, he estimates that he has lost about 15-20 pounds within the past month. Reviewed wt hx; pt has experienced a 12.9% wt loss over the past month, which is significant for time frame.   Discussed importance of good meal and supplement intake to promote  healing. Pt amenable to Ensure Enlive supplements.  Medications reviewed and include lovenox and senokot.   Labs reviewed.   NUTRITION - FOCUSED PHYSICAL EXAM:    Most Recent Value  Orbital Region Moderate depletion  Upper Arm Region Severe depletion  Thoracic and Lumbar Region Moderate depletion  Buccal Region Moderate depletion  Temple Region Moderate depletion  Clavicle Bone Region Severe depletion  Clavicle and Acromion Bone Region Severe depletion  Scapular Bone Region Severe depletion  Dorsal Hand Severe depletion  Patellar Region Moderate depletion  Anterior Thigh Region Moderate depletion  Posterior Calf Region Moderate depletion  Edema (RD Assessment) None  Hair Reviewed  Eyes Reviewed  Mouth Reviewed  Skin Reviewed  Nails Reviewed       Diet Order:   Diet Order            Diet 2 gram sodium Room service appropriate? Yes; Fluid consistency: Thin  Diet effective now                 EDUCATION NEEDS:   Education needs have been addressed  Skin:  Skin Assessment: Reviewed RN Assessment  Last BM:  05/09/20  Height:   Ht Readings from Last 1 Encounters:  05/10/20 5\' 8"  (1.727 m)    Weight:   Wt Readings from Last 1 Encounters:  05/10/20 66.5 kg    Ideal Body Weight:  70 kg  BMI:  Body mass index is 22.29 kg/m.  Estimated Nutritional Needs:   Kcal:  2100-2300  Protein:  115-130 grams  Fluid:  > 2 L    Loistine Chance, RD, LDN, CDCES  Registered Dietitian II Certified Diabetes Care and Education Specialist Please refer to [facility] for RD and/or RD on-call/weekend/after hours pager

## 2020-05-10 NOTE — Plan of Care (Signed)

## 2020-05-10 NOTE — Progress Notes (Signed)
[facility] Progress Note  Patient: Mathew Hall    VEH:[id number]  DOA: 05/09/2020     Date of Service: the patient was seen and examined on 05/10/2020  Brief hospital course: Past medcal history of recent COVID-19 infection with empyema treated with antibiotics, HTN, GERD, CAD SP PCI.  Presents with complaints of worsening back pain.  Found to have recurrent empyema and failed outpatient treatment. Currently plan is follow-up on recommendation from CT surgery.  Assessment and Plan: Empyema necessitans right chest  Treated with chest tube and IV antibiotics in September. Followed by oral antibiotics outpatient. With continues to have worsening chest pain and found to have persistent subcutaneous mass. CT scan shows evidence of persistent empyema. Pulmonary and cardiothoracic surgery were consulted. Cardiothoracic surgery scheduling the patient for limited right thoracotomy for drainage of the chest wall collection and the pleural collection. With decortication. May need to a wound VAC.   History of Metastatic nonsmall cell lung cancer, adenocarcinoma Right Lung cancer in the past   15 yrs ago, Dr Inda Merlin)  S/p neoadjuvant chemotherapy followed by right middle lobectomy with lymph node dissection.  Essential hypertension.  - On Norvasc,  Beta-blocker were stopped during last admission in September.  Patient still appears to be back on beta-blocker with bradycardia and therefore I will discontinue the beta-blocker. Patient reports that he lost his 20 pounds in the hospital and because of that his blood pressure is significantly better.  History of CAD.  Chest pain-free no acute issues, beta-blockers has been stopped.  Resume aspirin. Statin.  Severe protein calorie malnutrition. Dietary consultation. Body mass index is 22.29 kg/m.  Nutrition Problem: Severe Malnutrition Etiology: acute illness (recurrent rt empyema) Interventions: Interventions: Ensure Enlive  (each supplement provides 350kcal and 20 grams of protein), MVI      Diet: Regular diet DVT Prophylaxis:   Pneumatic SCD boots to accompany all patients to O.R. Start: 05/10/20 1557 enoxaparin (LOVENOX) injection 40 mg Start: 05/09/20 1315   Advance goals of care discussion: Full code  Family Communication: no family was present at bedside, at the time of interview.   Disposition:  Status is: Inpatient  Remains inpatient appropriate because:IV treatments appropriate due to intensity of illness or inability to take PO  Dispo: The patient is from: Home              Anticipated d/c is to: Home              Anticipated d/c date is: > 3 days              Patient currently is not medically stable to d/c.  Subjective: Continues to have some chest pain but controlled.  No nausea or vomiting.  No fever no chills.  Some cough.  Physical Exam:  General: Appear in mild distress, no Rash; Oral Mucosa Clear, moist. no Abnormal Neck Mass Or lumps, Conjunctiva normal  Cardiovascular: S1 and S2 Present, no Murmur, Respiratory: good respiratory effort, Bilateral Air entry present and right Crackles, no wheezes Abdomen: Bowel Sound present, Soft and no tenderness Extremities: no Pedal edema Neurology: alert and oriented to time, place, and person affect appropriate. no new focal deficit Gait not checked due to patient safety concerns  Vitals:   05/09/20 2153 05/10/20 0358 05/10/20 0731 05/10/20 0827  BP: (!) 121/57 119/66 119/66 119/68  Pulse: 62 (!) 57 (!) 57 (!) 58  Resp: 18 17 17 18   Temp: 97.7 F (36.5 C) 98.2 F (36.8 C) 98.2 F (36.8 C)  98.5 F (36.9 C)  TempSrc: Oral Oral Oral Oral  SpO2: 100% 98%  100%  Weight:   66.5 kg   Height:   5\' 8"  (1.727 m)     Intake/Output Summary (Last 24 hours) at 05/10/2020 1841 Last data filed at 05/09/2020 2300 Gross per 24 hour  Intake 0 ml  Output --  Net 0 ml   Filed Weights   05/10/20 0731  Weight: 66.5 kg    Data Reviewed: I  have personally reviewed and interpreted daily labs, tele strips, imagings as discussed above. I reviewed all nursing notes, pharmacy notes, vitals, pertinent old records I have discussed plan of care as described above with RN and patient/family.  CBC: Recent Labs  Lab 05/09/20 1104 05/10/20 0131 05/10/20 1635  WBC 6.4 6.1 5.7  HGB 10.6* 9.3* 9.5*  HCT 34.7* 29.8* 30.4*  MCV 85.7 83.5 83.3  PLT 360 290 124   Basic Metabolic Panel: Recent Labs  Lab 05/09/20 1104 05/10/20 1635  NA 137 136  K 4.2 4.0  CL 100 100  CO2 26 26  GLUCOSE 109* 104*  BUN 11 19  CREATININE 1.00 0.97  CALCIUM 9.5 9.3    Studies: No results found.  Scheduled Meds:  amLODipine  5 mg Oral Daily   aspirin  325 mg Oral Daily   enoxaparin (LOVENOX) injection  40 mg Subcutaneous Q24H   [START ON 05/11/2020] feeding supplement  237 mL Oral BID BM   multivitamin with minerals  1 tablet Oral Daily   pantoprazole  40 mg Oral Daily   potassium chloride SA  20 mEq Oral Daily   senna-docusate  1 tablet Oral BID   [START ON 05/11/2020] Vitamin D (Ergocalciferol)  50,000 Units Oral Weekly   Continuous Infusions:  ampicillin-sulbactam (UNASYN) IV 1.5 g (05/10/20 1638)   [START ON 05/11/2020] vancomycin     PRN Meds: acetaminophen **OR** acetaminophen, HYDROcodone-acetaminophen, ondansetron **OR** ondansetron (ZOFRAN) IV, oxybutynin, oxyCODONE  Time spent: 35 minutes  Author: Berle Mull, MD [facility] 05/10/2020 6:41 PM  To reach On-call, see care teams to locate the attending and reach out via [email or web address]. Between 7PM-7AM, please contact night-coverage If you still have difficulty reaching the attending provider, please page the [facility] (Director on Call) for [facility] on amion for assistance.

## 2020-05-10 NOTE — Plan of Care (Signed)
Patient is alert and oriented x4. Admitted from the ED. Ambulatory, on room air, expiratory wheezes noted, no cough. Right lower lobe empyema, plan for surgery.  Problem: Education: Goal: Knowledge of General Education information will improve Description: Including pain rating scale, medication(s)/side effects and non-pharmacologic comfort measures Outcome: Progressing   Problem: Health Behavior/Discharge Planning: Goal: Ability to manage health-related needs will improve Outcome: Progressing   Problem: Clinical Measurements: Goal: Ability to maintain clinical measurements within normal limits will improve Outcome: Progressing Goal: Will remain free from infection Outcome: Progressing Goal: Diagnostic test results will improve Outcome: Progressing Goal: Respiratory complications will improve Outcome: Progressing Goal: Cardiovascular complication will be avoided Outcome: Progressing   Problem: Activity: Goal: Risk for activity intolerance will decrease Outcome: Progressing   Problem: Nutrition: Goal: Adequate nutrition will be maintained Outcome: Progressing   Problem: Coping: Goal: Level of anxiety will decrease Outcome: Progressing   Problem: Elimination: Goal: Will not experience complications related to bowel motility Outcome: Progressing Goal: Will not experience complications related to urinary retention Outcome: Progressing   Problem: Pain Managment: Goal: General experience of comfort will improve Outcome: Progressing   Problem: Safety: Goal: Ability to remain free from injury will improve Outcome: Progressing   Problem: Skin Integrity: Goal: Risk for impaired skin integrity will decrease Outcome: Progressing

## 2020-05-10 NOTE — Progress Notes (Signed)
Pharmacy Antibiotic Note  Mathew Hall is a 79 y.o. male admitted on 05/09/2020 with empyema.  Pharmacy has been consulted for Unasyn dosing. Creat Clear 57 ml/min  Plan: Unasyn 1.5 gm IV q6h Monitor renal function and clinical status  Height: 5\' 8"  (172.7 cm) Weight: 66.5 kg (146 lb 9.7 oz) IBW/kg (Calculated) : 68.4  Temp (24hrs), Avg:98.1 F (36.7 C), Min:97.7 F (36.5 C), Max:98.5 F (36.9 C)  Recent Labs  Lab 05/09/20 1104 05/10/20 0131  WBC 6.4 6.1  CREATININE 1.00  --     Estimated Creatinine Clearance: 57.3 mL/min (by C-G formula based on SCr of 1 mg/dL).    Allergies  Allergen Reactions  . Lipitor [Atorvastatin Calcium]     Knee pain   . Atorvastatin Other (See Comments)    Joint/knee pain    Antimicrobials this admission: Unasyn 11/4 >>   Thank you for allowing pharmacy to be a part of this patient's care.  Alanda Slim, PharmD, [facility] Clinical Pharmacist Please see AMION for all Pharmacists' Contact Phone Numbers 05/10/2020, 11:00 AM

## 2020-05-10 NOTE — Progress Notes (Signed)
NAME:  Mathew Hall, MRN:  [id number], DOB:  1941-06-29, LOS: 1 ADMISSION DATE:  05/09/2020, CONSULTATION DATE:  05/10/20 REFERRING MD:  EDP CHIEF COMPLAINT:  empyema   Brief History   79yo male presented with recurrent shortness of breath and concerns for recurrent empyema. Patient was recently admitted 03/21/20 - 03/31/20 for empyema of right pleural space, she was treated with pigtail chest tube and Rocephin and Keflex for Streptococcus intermedius.   History of present illness   Mathew Hall is a 79yo male with PMX significant for Empyema, former smoker, right adenocarcinoma s/p partial resection and chemo, asthma, CAD S/P PCI, colon polyps, and GERD who presented to the ED at the recommendation of his primary Pulmonologist Dr. Rudi Heap. Patient was seen by Dr. Erin Fulling 11/1 who felt the patient had developed recurrent loculated effusion on the right with concerns of unresolved empyema from the initial presentation. Repeat imaging confirms recurrence. CT chest 11/2 is concerning for empyema with connection to the chest wall through the previous chest tube tract which would explain presence of right chest wall lump.   Case was discussed with cardiothoracic surgery and decision was made admit patient to the hospitalitis service with plan for surgery Friday. PCCM will be available as consult.     Past Medical History  MI 1999 Adenocarcinoma of right lung HTN HLD GERD CAD Asthma   Significant Hospital Events   Admit 11/3  Consults:  T[facility] PCCM  Procedures:    Significant Diagnostic Tests:  11/2 CT chest>> 1. Interval loculated pleural fluid posteriorly on the right with a surrounding rim of probably enhancing soft tissue and containing gas or air, suspicious for empyema. 2. The loculated right posterior pleural fluid collection has a lateral extension into the right posterolateral chest wall at the location of the entry tract of the previously demonstrated right chest tube  with a heterogeneous area of soft tissue thickening and small fluid collections with extensive adjacent soft tissue edema and stranding. This is compatible with an area of infection with probable small abscesses. 3. Small hiatal hernia. 4. Cholelithiasis. 5. Calcific coronary artery and aortic atherosclerosis.  Micro Data:  11/3 Covid-19, flu>> 11/3 BCx2>>  Antimicrobials:  Ceftriaxone 11/3 Unasyn 11/4-   Interim history/subjective:  No complaints.    Objective   Blood pressure 119/68, pulse (!) 58, temperature 98.5 F (36.9 C), temperature source Oral, resp. rate 18, height 5\' 8"  (1.727 m), weight 66.5 kg, SpO2 100 %.        Intake/Output Summary (Last 24 hours) at 05/10/2020 1206 Last data filed at 05/09/2020 2300 Gross per 24 hour  Intake 100 ml  Output --  Net 100 ml   Filed Weights   05/10/20 0731  Weight: 66.5 kg   General:  Very pleasant elderly male lying in bed in NAD Neuro: Alert, oriented x3, MAE CV: rrrr, no murmur PULM:  Non labored, lungs clear, remains on room air, large firm lateral/ posterior mass mildly tender GI: soft, NT, +bs Extremities: warm/dry, no LE edema  Skin: no rashes   Resolved Hospital Problem list    Assessment & Plan:   R-sided Empyema and posterior mass in the setting of recent Covid PNA and chest tube Pt clinically stable, but not improving with outpatient treatment, previously grew pan-sensitive staph intermedius, treated with Rocephin P: Continue Unasyn Plans for surgical evaluation tentatively 11/5 am by TCTS and will follow culture data  PCCM will continue to follow     Labs   CBC: Recent  Labs  Lab 05/09/20 1104 05/10/20 0131  WBC 6.4 6.1  HGB 10.6* 9.3*  HCT 34.7* 29.8*  MCV 85.7 83.5  PLT 360 280    Basic Metabolic Panel: Recent Labs  Lab 05/09/20 1104  NA 137  K 4.2  CL 100  CO2 26  GLUCOSE 109*  BUN 11  CREATININE 1.00  CALCIUM 9.5   GFR: Estimated Creatinine Clearance: 57.3 mL/min (by C-G  formula based on SCr of 1 mg/dL). Recent Labs  Lab 05/09/20 1104 05/10/20 0131  WBC 6.4 6.1    Liver Function Tests: No results for input(s): AST, ALT, ALKPHOS, BILITOT, PROT, ALBUMIN in the last 168 hours. No results for input(s): LIPASE, AMYLASE in the last 168 hours. No results for input(s): AMMONIA in the last 168 hours.  ABG    Component Value Date/Time   PHART 7.429 03/06/2009 0437   PCO2ART 36.0 03/06/2009 0437   PO2ART 62.0 (L) 03/06/2009 0437   HCO3 23.9 03/06/2009 0437   TCO2 25 03/06/2009 0437   ACIDBASEDEF 0.5 03/01/2009 0922   O2SAT 93.0 03/06/2009 0437     Coagulation Profile: No results for input(s): INR, PROTIME in the last 168 hours.  Cardiac Enzymes: No results for input(s): CKTOTAL, CKMB, CKMBINDEX, TROPONINI in the last 168 hours.  HbA1C: Hgb A1c MFr Bld  Date/Time Value Ref Range Status  03/23/2020 09:03 AM 5.6 4.8 - 5.6 % Final    Comment:    (NOTE) Pre diabetes:          5.7%-6.4%  Diabetes:              >6.4%  Glycemic control for   <7.0% adults with diabetes     CBG: No results for input(s): GLUCAP in the last 168 hours.    Kennieth Rad, ACNP [facility] 05/10/2020, 12:06 PM

## 2020-05-11 ENCOUNTER — Inpatient Hospital Stay (HOSPITAL_COMMUNITY): Payer: Medicare Other | Admitting: Certified Registered Nurse Anesthetist

## 2020-05-11 ENCOUNTER — Inpatient Hospital Stay (HOSPITAL_COMMUNITY): Payer: Medicare Other

## 2020-05-11 ENCOUNTER — Encounter (HOSPITAL_COMMUNITY)
Admission: EM | Disposition: A | Discharge status: Home Health | Payer: Self-pay | Source: Home / Self Care | Attending: Family Medicine

## 2020-05-11 DIAGNOSIS — J860 Pyothorax with fistula: Secondary | ICD-10-CM

## 2020-05-11 DIAGNOSIS — J869 Pyothorax without fistula: Secondary | ICD-10-CM | POA: Diagnosis not present

## 2020-05-11 DIAGNOSIS — N400 Enlarged prostate without lower urinary tract symptoms: Secondary | ICD-10-CM

## 2020-05-11 HISTORY — PX: EMPYEMA DRAINAGE: SHX5097

## 2020-05-11 HISTORY — DX: Benign prostatic hyperplasia without lower urinary tract symptoms: N40.0

## 2020-05-11 HISTORY — PX: APPLICATION OF WOUND VAC: SHX5189

## 2020-05-11 HISTORY — PX: RIB RESECTION: SHX5077

## 2020-05-11 HISTORY — PX: THORACOTOMY: SHX5074

## 2020-05-11 LAB — CBC WITH DIFFERENTIAL/PLATELET
Abs Immature Granulocytes: 0.01 10*3/uL (ref 0.00–0.07)
Basophils Absolute: 0 10*3/uL (ref 0.0–0.1)
Basophils Relative: 1 %
Eosinophils Absolute: 0.1 10*3/uL (ref 0.0–0.5)
Eosinophils Relative: 2 %
HCT: 31.1 % — ABNORMAL LOW (ref 39.0–52.0)
Hemoglobin: 9.9 g/dL — ABNORMAL LOW (ref 13.0–17.0)
Immature Granulocytes: 0 %
Lymphocytes Relative: 33 %
Lymphs Abs: 1.6 10*3/uL (ref 0.7–4.0)
MCH: 26.7 pg (ref 26.0–34.0)
MCHC: 31.8 g/dL (ref 30.0–36.0)
MCV: 83.8 fL (ref 80.0–100.0)
Monocytes Absolute: 0.5 10*3/uL (ref 0.1–1.0)
Monocytes Relative: 11 %
Neutro Abs: 2.6 10*3/uL (ref 1.7–7.7)
Neutrophils Relative %: 53 %
Platelets: 299 10*3/uL (ref 150–400)
RBC: 3.71 MIL/uL — ABNORMAL LOW (ref 4.22–5.81)
RDW: 14.9 % (ref 11.5–15.5)
WBC: 4.9 10*3/uL (ref 4.0–10.5)
nRBC: 0 % (ref 0.0–0.2)

## 2020-05-11 LAB — COMPREHENSIVE METABOLIC PANEL
ALT: 13 U/L (ref 0–44)
AST: 14 U/L — ABNORMAL LOW (ref 15–41)
Albumin: 2.7 g/dL — ABNORMAL LOW (ref 3.5–5.0)
Alkaline Phosphatase: 60 U/L (ref 38–126)
Anion gap: 9 (ref 5–15)
BUN: 15 mg/dL (ref 8–23)
CO2: 24 mmol/L (ref 22–32)
Calcium: 9.3 mg/dL (ref 8.9–10.3)
Chloride: 103 mmol/L (ref 98–111)
Creatinine, Ser: 0.9 mg/dL (ref 0.61–1.24)
GFR, Estimated: >60 mL/min (ref >60)
Glucose, Bld: 111 mg/dL — ABNORMAL HIGH (ref 70–99)
Potassium: 4.2 mmol/L (ref 3.5–5.1)
Sodium: 136 mmol/L (ref 135–145)
Total Bilirubin: 0.6 mg/dL (ref 0.3–1.2)
Total Protein: 7 g/dL (ref 6.5–8.1)

## 2020-05-11 LAB — GLUCOSE, CAPILLARY: Glucose-Capillary: 175 mg/dL — ABNORMAL HIGH (ref 70–99)

## 2020-05-11 LAB — C-REACTIVE PROTEIN: CRP: 2.5 mg/dL — ABNORMAL HIGH (ref <1.0)

## 2020-05-11 LAB — SEDIMENTATION RATE: Sed Rate: 83 mm/hr — ABNORMAL HIGH (ref 0–16)

## 2020-05-11 LAB — PROTIME-INR
INR: 1.1 (ref 0.8–1.2)
Prothrombin Time: 13.5 seconds (ref 11.4–15.2)

## 2020-05-11 LAB — SURGICAL PCR SCREEN
MRSA, PCR: NEGATIVE
Staphylococcus aureus: NEGATIVE

## 2020-05-11 SURGERY — THORACOTOMY, MAJOR
Anesthesia: General | Site: Chest | Laterality: Right

## 2020-05-11 MED ORDER — ENOXAPARIN SODIUM 40 MG/0.4ML ~~LOC~~ SOLN: 40.0000 mg | Freq: Every day | SUBCUTANEOUS | Status: DC

## 2020-05-11 MED ORDER — MIDAZOLAM HCL 2 MG/2ML IJ SOLN
INTRAMUSCULAR | Status: DC | PRN
  Administered 2020-05-11 (×2): 1 mg via INTRAVENOUS

## 2020-05-11 MED ORDER — FENTANYL CITRATE (PF) 100 MCG/2ML IJ SOLN: 25.0000 ug | INTRAMUSCULAR | Status: DC | PRN

## 2020-05-11 MED ORDER — FENTANYL CITRATE (PF) 250 MCG/5ML IJ SOLN
INTRAMUSCULAR | Status: AC
  Filled 2020-05-11: qty 5

## 2020-05-11 MED ORDER — SODIUM CHLORIDE 0.9 % IV SOLN: INTRAVENOUS | Status: DC

## 2020-05-11 MED ORDER — CHLORHEXIDINE GLUCONATE CLOTH 2 % EX PADS
6.0000 | MEDICATED_PAD | Freq: Every day | CUTANEOUS | Status: DC
  Administered 2020-05-11 – 2020-05-28 (×11): 6 via TOPICAL

## 2020-05-11 MED ORDER — 0.9 % SODIUM CHLORIDE (POUR BTL) OPTIME
TOPICAL | Status: DC | PRN
  Administered 2020-05-11: 2000 mL

## 2020-05-11 MED ORDER — LUNG SURGERY BOOK
Freq: Once | Status: AC
  Administered 2020-05-11: 1
  Filled 2020-05-11: qty 1

## 2020-05-11 MED ORDER — BISACODYL 5 MG PO TBEC
10.0000 mg | DELAYED_RELEASE_TABLET | Freq: Every day | ORAL | Status: DC
  Administered 2020-05-12 – 2020-05-13 (×2): 10 mg via ORAL
  Filled 2020-05-11 (×2): qty 2

## 2020-05-11 MED ORDER — SUGAMMADEX SODIUM 200 MG/2ML IV SOLN
INTRAVENOUS | Status: DC | PRN
  Administered 2020-05-11: 300 mg via INTRAVENOUS

## 2020-05-11 MED ORDER — SODIUM CHLORIDE 0.9 % IV SOLN: INTRAVENOUS | Status: DC | PRN

## 2020-05-11 MED ORDER — LIDOCAINE 2% (20 MG/ML) 5 ML SYRINGE
INTRAMUSCULAR | Status: DC | PRN
  Administered 2020-05-11: 60 mg via INTRAVENOUS

## 2020-05-11 MED ORDER — ROCURONIUM BROMIDE 10 MG/ML (PF) SYRINGE
PREFILLED_SYRINGE | INTRAVENOUS | Status: DC | PRN
  Administered 2020-05-11 (×2): 50 mg via INTRAVENOUS
  Administered 2020-05-11: 20 mg via INTRAVENOUS

## 2020-05-11 MED ORDER — ONDANSETRON HCL 4 MG/2ML IJ SOLN: 4.0000 mg | Freq: Four times a day (QID) | INTRAMUSCULAR | Status: DC | PRN

## 2020-05-11 MED ORDER — DEXAMETHASONE SODIUM PHOSPHATE 10 MG/ML IJ SOLN
INTRAMUSCULAR | Status: DC | PRN
  Administered 2020-05-11: 10 mg via INTRAVENOUS

## 2020-05-11 MED ORDER — SODIUM CHLORIDE FLUSH 0.9 % IV SOLN: INTRAVENOUS | Status: DC | PRN

## 2020-05-11 MED ORDER — OXYCODONE HCL 5 MG/5ML PO SOLN: 5.0000 mg | Freq: Once | ORAL | Status: DC | PRN

## 2020-05-11 MED ORDER — FENTANYL CITRATE (PF) 250 MCG/5ML IJ SOLN
INTRAMUSCULAR | Status: DC | PRN
  Administered 2020-05-11 (×5): 50 ug via INTRAVENOUS
  Administered 2020-05-11: 25 ug via INTRAVENOUS
  Administered 2020-05-11: 50 ug via INTRAVENOUS

## 2020-05-11 MED ORDER — TRAMADOL HCL 50 MG PO TABS
50.0000 mg | ORAL_TABLET | Freq: Four times a day (QID) | ORAL | Status: DC | PRN
  Administered 2020-05-13: 50 mg via ORAL
  Filled 2020-05-11: qty 1

## 2020-05-11 MED ORDER — PROPOFOL 10 MG/ML IV BOLUS
INTRAVENOUS | Status: AC
  Filled 2020-05-11: qty 20

## 2020-05-11 MED ORDER — CEFAZOLIN SODIUM-DEXTROSE 2-3 GM-%(50ML) IV SOLR
INTRAVENOUS | Status: DC | PRN
  Administered 2020-05-11: 2 g via INTRAVENOUS

## 2020-05-11 MED ORDER — MIDAZOLAM HCL 2 MG/2ML IJ SOLN
INTRAMUSCULAR | Status: AC
  Filled 2020-05-11: qty 2

## 2020-05-11 MED ORDER — OXYCODONE HCL 5 MG PO TABS: 5.0000 mg | ORAL_TABLET | Freq: Once | ORAL | Status: DC | PRN

## 2020-05-11 MED ORDER — BUPIVACAINE HCL (PF) 0.5 % IJ SOLN
INTRAMUSCULAR | Status: AC
  Filled 2020-05-11: qty 30

## 2020-05-11 MED ORDER — ENOXAPARIN SODIUM 40 MG/0.4ML ~~LOC~~ SOLN
40.0000 mg | SUBCUTANEOUS | Status: DC
  Administered 2020-05-12 – 2020-05-13 (×2): 40 mg via SUBCUTANEOUS
  Filled 2020-05-11 (×3): qty 0.4

## 2020-05-11 MED ORDER — ONDANSETRON HCL 4 MG/2ML IJ SOLN
INTRAMUSCULAR | Status: DC | PRN
  Administered 2020-05-11: 4 mg via INTRAVENOUS

## 2020-05-11 MED ORDER — LACTATED RINGERS IV SOLN: INTRAVENOUS | Status: DC | PRN

## 2020-05-11 MED ORDER — PHENYLEPHRINE HCL (PRESSORS) 10 MG/ML IV SOLN
INTRAVENOUS | Status: DC | PRN
  Administered 2020-05-11: 80 ug via INTRAVENOUS

## 2020-05-11 MED ORDER — PHENYLEPHRINE HCL-NACL 10-0.9 MG/250ML-% IV SOLN
INTRAVENOUS | Status: DC | PRN
  Administered 2020-05-11: 25 ug/min via INTRAVENOUS

## 2020-05-11 MED ORDER — PROPOFOL 10 MG/ML IV BOLUS
INTRAVENOUS | Status: DC | PRN
  Administered 2020-05-11: 140 mg via INTRAVENOUS

## 2020-05-11 MED ORDER — AMLODIPINE BESYLATE 5 MG PO TABS
5.0000 mg | ORAL_TABLET | Freq: Every day | ORAL | Status: DC
  Administered 2020-05-12 – 2020-05-18 (×5): 5 mg via ORAL
  Filled 2020-05-11 (×5): qty 1

## 2020-05-11 MED ORDER — BUPIVACAINE LIPOSOME 1.3 % IJ SUSP
20.0000 mL | Freq: Once | INTRAMUSCULAR | Status: DC
  Filled 2020-05-11: qty 20

## 2020-05-11 SURGICAL SUPPLY — 74 items
APPLIER CLIP ROT 10 11.4 M/L (STAPLE)
BIT DRILL 7/64X5 DISP (BIT) IMPLANT
BLADE CLIPPER SURG (BLADE) ×2 IMPLANT
CANISTER SUCT 3000ML PPV (MISCELLANEOUS) ×8 IMPLANT
CATH COUDE FOLEY 2W 5CC 16FR (CATHETERS) ×2 IMPLANT
CATH COUDE FOLEY 2W 5CC 18FR (CATHETERS) ×2 IMPLANT
CATH FOLEY 2W COUNCIL 5CC 18FR (CATHETERS) ×2 IMPLANT
CATH FOLEY 2WAY SLVR  5CC 12FR (CATHETERS) ×8
CATH FOLEY 2WAY SLVR 5CC 12FR (CATHETERS) IMPLANT
CATH THORACIC 28FR (CATHETERS) IMPLANT
CATH THORACIC 28FR RT ANG (CATHETERS) IMPLANT
CATH THORACIC 36FR (CATHETERS) IMPLANT
CATH THORACIC 36FR RT ANG (CATHETERS) IMPLANT
CLIP APPLIE ROT 10 11.4 M/L (STAPLE) IMPLANT
CLIP VESOCCLUDE MED 6/CT (CLIP) ×4 IMPLANT
CNTNR URN SCR LID CUP LEK RST (MISCELLANEOUS) IMPLANT
CONN ST 1/4X3/8  BEN (MISCELLANEOUS)
CONN ST 1/4X3/8 BEN (MISCELLANEOUS) IMPLANT
CONT SPEC 4OZ STRL OR WHT (MISCELLANEOUS) ×4
DEFOGGER ANTIFOG KIT (MISCELLANEOUS) ×2 IMPLANT
DERMABOND ADVANCED (GAUZE/BANDAGES/DRESSINGS)
DERMABOND ADVANCED .7 DNX12 (GAUZE/BANDAGES/DRESSINGS) IMPLANT
DRSG VERAFLOW VAC LRG (GAUZE/BANDAGES/DRESSINGS) ×2 IMPLANT
DRSG VERSA FOAM LRG 10X15 (GAUZE/BANDAGES/DRESSINGS) ×2 IMPLANT
ELECT BLADE 6.5 EXT (BLADE) ×4 IMPLANT
ELECT REM PT RETURN 9FT ADLT (ELECTROSURGICAL) ×4
ELECTRODE REM PT RTRN 9FT ADLT (ELECTROSURGICAL) ×2 IMPLANT
FELT TEFLON 1X6 (MISCELLANEOUS) ×4 IMPLANT
GAUZE SPONGE 4X4 12PLY STRL (GAUZE/BANDAGES/DRESSINGS) ×4 IMPLANT
GLOVE BIOGEL PI IND STRL 7.0 (GLOVE) IMPLANT
GLOVE BIOGEL PI INDICATOR 7.0 (GLOVE) ×6
GLOVE SURG SIGNA 7.5 PF LTX (GLOVE) ×12 IMPLANT
GOWN STRL REUS W/ TWL LRG LVL3 (GOWN DISPOSABLE) ×4 IMPLANT
GOWN STRL REUS W/ TWL XL LVL3 (GOWN DISPOSABLE) ×2 IMPLANT
GOWN STRL REUS W/TWL LRG LVL3 (GOWN DISPOSABLE) ×8
GOWN STRL REUS W/TWL XL LVL3 (GOWN DISPOSABLE) ×4
GUIDEWIRE STR DUAL SENSOR (WIRE) ×2 IMPLANT
INSERT FOGARTY 61MM (MISCELLANEOUS) IMPLANT
KIT BASIN OR (CUSTOM PROCEDURE TRAY) ×4 IMPLANT
KIT SUCTION CATH 14FR (SUCTIONS) ×4 IMPLANT
KIT TURNOVER KIT B (KITS) ×4 IMPLANT
NS IRRIG 1000ML POUR BTL (IV SOLUTION) ×12 IMPLANT
PACK CHEST (CUSTOM PROCEDURE TRAY) ×4 IMPLANT
PAD ARMBOARD 7.5X6 YLW CONV (MISCELLANEOUS) ×8 IMPLANT
PASSER SUT SWANSON 36MM LOOP (INSTRUMENTS) IMPLANT
SEALANT SURG COSEAL 8ML (VASCULAR PRODUCTS) IMPLANT
SPONGE TONSIL TAPE 1 RFD (DISPOSABLE) ×4 IMPLANT
STAPLER VISISTAT 35W (STAPLE) IMPLANT
STOPCOCK 4 WAY LG BORE MALE ST (IV SETS) ×4 IMPLANT
SUT PROLENE 2 0 MH 48 (SUTURE) IMPLANT
SUT PROLENE 2 0 SH DA (SUTURE) IMPLANT
SUT PROLENE 3 0 SH 48 (SUTURE) IMPLANT
SUT PROLENE 4 0 RB 1 (SUTURE) ×4
SUT PROLENE 4 0 SH DA (SUTURE) IMPLANT
SUT PROLENE 4-0 RB1 .5 CRCL 36 (SUTURE) ×2 IMPLANT
SUT SILK  1 MH (SUTURE) ×8
SUT SILK 1 MH (SUTURE) ×4 IMPLANT
SUT SILK 2 0SH CR/8 30 (SUTURE) ×4 IMPLANT
SUT VIC AB 1 CTX 18 (SUTURE) IMPLANT
SUT VIC AB 1 CTX 36 (SUTURE) ×4
SUT VIC AB 1 CTX36XBRD ANBCTR (SUTURE) ×2 IMPLANT
SUT VIC AB 3-0 X1 27 (SUTURE) ×4 IMPLANT
SUT VICRYL 2 TP 1 (SUTURE) ×4 IMPLANT
SWAB CULTURE ESWAB REG 1ML (MISCELLANEOUS) IMPLANT
SYR 10ML LL (SYRINGE) ×4 IMPLANT
SYR 20ML LL LF (SYRINGE) IMPLANT
SYR 50ML LL SCALE MARK (SYRINGE) ×4 IMPLANT
SYR BULB IRRIG 60ML STRL (SYRINGE) ×2 IMPLANT
SYSTEM SAHARA CHEST DRAIN ATS (WOUND CARE) ×4 IMPLANT
TOWEL GREEN STERILE (TOWEL DISPOSABLE) ×8 IMPLANT
TRAP SPECIMEN MUCUS 40CC (MISCELLANEOUS) ×2 IMPLANT
TRAY FOLEY SLVR 16FR LF STAT (SET/KITS/TRAYS/PACK) ×4 IMPLANT
TUBING EXTENTION W/L.L. (IV SETS) IMPLANT
WATER STERILE IRR 1000ML POUR (IV SOLUTION) ×4 IMPLANT

## 2020-05-11 NOTE — Transfer of Care (Signed)
Immediate Anesthesia Transfer of Care Note  Patient: Mathew Hall  Procedure(s) Performed: THORACOTOMY (Right ) EMPYEMA DRAINAGE (Right ) RIB RESECTION (Right ) APPLICATION OF WOUND VAC (Right Chest)  Patient Location: PACU  Anesthesia Type:General  Level of Consciousness: awake and alert   Airway & Oxygen Therapy: Patient Spontanous Breathing  Post-op Assessment: Report given to RN and Post -op Vital signs reviewed and stable  Post vital signs: Reviewed and stable  Last Vitals:  Vitals Value Taken Time  BP 122/66 05/11/20 1100  Temp 36.7 C 05/11/20 1100  Pulse 64 05/11/20 1105  Resp 14 05/11/20 1105  SpO2 94 % 05/11/20 1105  Vitals shown include unvalidated device data.  Last Pain:  Vitals:   05/11/20 1100  TempSrc:   PainSc: 0-No pain         Complications: No complications documented.

## 2020-05-11 NOTE — Op Note (Signed)
NAME: Mathew Hall, GRANITO MEDICAL RECORD [id number] ACCOUNT [id number] DATE OF BIRTH:03-Jan-1941 FACILITY: [facility] LOCATION: [facility] PHYSICIAN:Tyffany Waldrop Chaya Jan, MD  OPERATIVE REPORT  DATE OF PROCEDURE:  05/11/2020  PREOPERATIVE DIAGNOSIS:  Right empyema necessitans.  POSTOPERATIVE DIAGNOSIS:  Right empyema necessitans.  PROCEDURE:  Right thoracotomy, drainage of empyema, rib resection, VAC placement.  SURGEON:  Modesto Charon, MD  ASSISTANT:  Enid Cutter, PA-C.  ANESTHESIA:  General.  FINDINGS:  Purulent fluid in chest wall and pleural space.  Approximately 6 cm space in the pleural space with lung encased in a dense peel.  CLINICAL NOTE:  Mathew Hall is a 79 year old gentleman with a prior history of right middle lobectomy.  He was treated in September for a streptococcal pneumonia complicated by empyema.  He had a chest tube placed at that time.  Recently, he noted a lump  on the right lower back.  A CT of the chest showed a fluid collection with a thick wall in the right pleural space with communication into the chest wall consistent with an empyema necessitans.  The patient was advised to undergo surgical drainage and  informed of the possibility of need for rib resection and VAC placement.  The indications, risks, benefits, and alternatives were discussed in detail with the patient.  He understood and accepted the risks and agreed to proceed.  OPERATIVE NOTE:  [name] was brought to the preoperative holding area on 05/11/2020.  Anesthesia placed an arterial blood pressure monitoring line and a central venous catheter.  He was taken to the operating room, anesthetized and intubated with a double  lumen endotracheal tube.  Intravenous antibiotics were administered.  Sequential compression devices were placed on the calves for DVT prophylaxis.  An attempt was made by the nursing staff to place a Foley catheter, but resistance was met.  Dr. Junious Silk  from urology was  consulted and came in and placed the Foley catheter without complication.  The patient then was placed in a left lateral decubitus position and the right chest was prepped and draped in the usual sterile fashion.  Single lung ventilation of the left lung was initiated and was tolerated well during the procedure.  A timeout was performed.  An incision was made through the fluctuant area on the chest wall.  It was carried through the subcutaneous tissue and skin.   Hemostasis was achieved.  The muscle then was divided with cautery and the fluid collection was entered.  There was purulent fluid in this area that was sent for aerobic and anaerobic cultures.  The extent of the chest wall cavity then was opened and all  of the fluid and necrotic tissue were removed and debrided respectively.  There was excisional debridement.  The communication into the chest was identified and a sucker was placed into that and murky purulent fluid was evacuated.  The intercostal  muscles were divided and the space was inspected.  Single lung ventilation was stopped and dual-lung ventilation was resumed.  There was essentially no movement of the lung.  There was a thick fibrous peel encasing the lung.  It was not felt that  decortication would be successful.  A portion of the 10th rib was resected to allow a larger opening into the space.  The wound and space then were copiously irrigated with warm saline.  A white VAC sponge was placed into the chest cavity and the wound  was packed with a standard VAC gauze sponge.  The dressing was applied and 50 mmHg negative  pressure was instituted.  The patient then was extubated in the operating room and taken to the [facility] in good condition.  HN/NUANCE  D:05/11/2020 T:05/11/2020 JOB:[id number]

## 2020-05-11 NOTE — Plan of Care (Signed)
Patient is alert and oriented x4. Patient denies any pain . NPO for surgery today. Problem: Education: Goal: Knowledge of General Education information will improve Description: Including pain rating scale, medication(s)/side effects and non-pharmacologic comfort measures Outcome: Progressing   Problem: Health Behavior/Discharge Planning: Goal: Ability to manage health-related needs will improve Outcome: Progressing   Problem: Clinical Measurements: Goal: Ability to maintain clinical measurements within normal limits will improve Outcome: Progressing Goal: Will remain free from infection Outcome: Progressing Goal: Diagnostic test results will improve Outcome: Progressing Goal: Respiratory complications will improve Outcome: Progressing Goal: Cardiovascular complication will be avoided Outcome: Progressing   Problem: Activity: Goal: Risk for activity intolerance will decrease Outcome: Progressing   Problem: Nutrition: Goal: Adequate nutrition will be maintained Outcome: Progressing   Problem: Coping: Goal: Level of anxiety will decrease Outcome: Progressing   Problem: Elimination: Goal: Will not experience complications related to bowel motility Outcome: Progressing Goal: Will not experience complications related to urinary retention Outcome: Progressing   Problem: Pain Managment: Goal: General experience of comfort will improve Outcome: Progressing   Problem: Safety: Goal: Ability to remain free from injury will improve Outcome: Progressing   Problem: Skin Integrity: Goal: Risk for impaired skin integrity will decrease Outcome: Progressing

## 2020-05-11 NOTE — Progress Notes (Signed)
Patient transported to [place] post op from PACU. MD came to PACU to see patient and learned they were transported to [place], but needed to be transferred to [facility]. Patient brought back to PACU for closer monitoring and care while waiting on 2C bed. Monitoring resumed at 12:19.

## 2020-05-11 NOTE — Progress Notes (Signed)
Pt seen in room , alert/oriented in no apparent distress.wound site intact with wound vac, and has a foley cath with bloody discharges. VSS, No complaints voiced.

## 2020-05-11 NOTE — Anesthesia Procedure Notes (Signed)
Procedure Name: Intubation Date/Time: 05/11/2020 7:55 AM Performed by: Clearnce Sorrel, CRNA Pre-anesthesia Checklist: Patient identified, Emergency Drugs available, Suction available, Patient being monitored and Timeout performed Patient Re-evaluated:Patient Re-evaluated prior to induction Oxygen Delivery Method: Circle system utilized Preoxygenation: Pre-oxygenation with 100% oxygen Induction Type: IV induction Ventilation: Mask ventilation without difficulty and Oral airway inserted - appropriate to patient size Laryngoscope Size: Mac and 4 Grade View: Grade I Tube type: Oral Endobronchial tube: Left and 37 Fr Number of attempts: 1 Airway Equipment and Method: Stylet Placement Confirmation: ETT inserted through vocal cords under direct vision,  positive ETCO2 and breath sounds checked- equal and bilateral Tube secured with: Tape Dental Injury: Teeth and Oropharynx as per pre-operative assessment

## 2020-05-11 NOTE — TOC Initial Note (Signed)
Transition of Care [facility]) - Initial/Assessment Note    Patient Details  Name: Mathew Hall MRN: [id number] Date of Birth: June 03, 1941  Transition of Care [facility]) CM/SW Contact:    Verdell Carmine, RN Phone Number: 05/11/2020, 4:03 PM  Clinical Narrative:                 Patient admitted for failed outpatient treatment for empyema, right lung decortication and drainage, wound vac and chest tube. Had lung cancer 15 years ago. Patient is cacetic, need to look into dietary supplementation, when he can eat better post surgically. May potentially need wound vac for home. CM will follow for needs. Patient lives with his wife.   Expected Discharge Plan: Home/Self Care Barriers to Discharge: Continued Medical Work up   Patient Goals and CMS Choice        Expected Discharge Plan and Services Expected Discharge Plan: Home/Self Care   Discharge Planning Services: CM Consult   Living arrangements for the past 2 months: Single Family Home                                      Prior Living Arrangements/Services Living arrangements for the past 2 months: Single Family Home Lives with:: Spouse Patient language and need for interpreter reviewed:: Yes        Need for Family Participation in Patient Care: Yes (Comment) Care giver support system in place?: Yes (comment)   Criminal Activity/Legal Involvement Pertinent to Current Situation/Hospitalization: No - Comment as needed  Activities of Daily Living Home Assistive Devices/Equipment: None ADL Screening (condition at time of admission) Patient's cognitive ability adequate to safely complete daily activities?: Yes Is the patient deaf or have difficulty hearing?: Yes Does the patient have difficulty seeing, even when wearing glasses/contacts?: No Does the patient have difficulty concentrating, remembering, or making decisions?: No Patient able to express need for assistance with ADLs?: Yes Does the patient have difficulty  dressing or bathing?: No Independently performs ADLs?: Yes (appropriate for developmental age) Does the patient have difficulty walking or climbing stairs?: No Weakness of Legs: None Weakness of Arms/Hands: None  Permission Sought/Granted                  Emotional Assessment       Orientation: : Oriented to Self, Oriented to Place, Oriented to  Time, Oriented to Situation Alcohol / Substance Use: Not Applicable Psych Involvement: No (comment)  Admission diagnosis:  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 Empyema of pleural space ([place]) 05/11/2020  . Protein-calorie malnutrition, severe 05/10/2020  . Chest tube in place   . Empyema ([place])   . Loculated pleural effusion   . History of COVID-19 03/22/2020  . CAD (coronary artery disease), emergent stent to ostial RCA and mid RCA with an acute inferior MI in 1999, residual left main disease 40% at that time, last week's study 2011 with inferior scar no isc   . 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 C O P D 10/27/2008  . HYPERLIPIDEMIA 10/10/2008  . Essential hypertension 10/10/2008  . Acute myocardial infarction ([place]) 10/10/2008  . PERSONAL HX COLONIC POLYPS 09/05/2008  . 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 HEMORRHOIDS, INTERNAL 09/04/2008  . ESOPHAGEAL STRICTURE 09/04/2008  . GERD 09/04/2008   PCP:  [name], MD Pharmacy:   [id number] - , [place] [address] [place] [address] Phone: [phone] Fax: [phone]     Social Determinants of Health (SDOH) Interventions    Readmission Risk Interventions No flowsheet data found.

## 2020-05-11 NOTE — Consult Note (Signed)
Consult: difficult foley Requested by: Dr. Modesto Charon   Procedure: Patient was already prepped and draped in the operating room.  I tried to pass a 26 [place] coud catheter which seemed to get hung up in the prostate.  I then passed an 58 [place] coud which also did not make the turn.  The flexible cystoscope was then passed in the urethra was unremarkable, there may have been a small false passage to the right at the veru but no significant false passage or bleeding noted.  There was a sharp turn and a high bladder neck with some BPH and elongated prostatic urethra likely making the catheter placement difficult.  Having seen the anatomy I backed out and tried to pass the 18 coud again but it would not go.  Therefore repassed the cystoscope into the bladder and passed a sensor wire.  Over the sensor wire an 18 [place] council tip catheter was passed without difficulty.  The wire was removed.  Balloon inflated and seated at the bladder neck.  Urine was clear.  He was turned back over to Dr. Roxan Hockey.  I ordered 2 g of IV Ancef to cover for urologic manipulation.  Final assessment/plan: BPH, difficult foley - Recommend to continue Foley catheter until patient is ambulatory and start tamsulosin post-op.  H&P  Chief Complaint:   History of Present Illness: Mr. Bazen is a 79 year old male undergoing thoracotomy in the operating room.  Nurses could not place Foley catheter.  Nurse noted a weak stream with minimal urination in the preop area.  No GU history noted in chart.  Past Medical History:  Diagnosis Date  . Asthma   . CAD (coronary artery disease) 1999   stents to RCA, residual 40% Lt main, decreased EF40%   . Cataract    bilateral-beginning stages  . Colon polyps   . GERD (gastroesophageal reflux disease)   . H/O cardiovascular stress test 1999/2005/2008/2011   last with inf scar/infarct, EF 54%  . H/O echocardiogram 2008   EF 50-55%, Mild MR, Mild TR,    . Hemorrhoids   . Hx  of acute myocardial infarction of inferior wall 02/18/1998  . Hyperlipemia   . Hypertension   . lung ca dx'd 10/2008   chemo comp 01/2009-stage 4 lung cancer  . Myocardial infarction ([place]) 02-18-1998  . Peptic stricture of esophagus   . Right carotid bruit 04/01/12   minimal carotid stenosis on doppler   Past Surgical History:  Procedure Laterality Date  . ANGIOPLASTY     with 2 stent placement  . COLONOSCOPY    . CORONARY ANGIOPLASTY WITH STENT PLACEMENT  02/18/1998   PCI/stents to ostial and mid RCA, emergently, residual Lt main disease  . LUNG SURGERY     right  . POLYPECTOMY     HPP 09-21-08    Home Medications:  Facility-Administered Medications Prior to Admission  Medication Dose Route Frequency Provider Last Rate Last Admin  . 0.9 %  sodium chloride infusion  500 mL Intravenous Once [name], MD       Medications Prior to Admission  Medication Sig Dispense Refill Last Dose  . amLODipine (NORVASC) 5 MG tablet Take 1 tablet by mouth daily.   05/09/2020 at Unknown time  . aspirin 325 MG EC tablet Take 1 tablet (325 mg total) by mouth daily. Please hold for 15 days when you are on Eliquis, resume on 10/11 once you are off Eliquis.   05/09/2020 at Unknown time  . ergocalciferol (VITAMIN D2)  1.25 MG (50000 UT) capsule Take 50,000 Units by mouth once a week. Friday   05/04/2020  . esomeprazole (NEXIUM) 40 MG capsule Take 40 mg by mouth daily at 12 noon.   05/09/2020 at Unknown time  . KLOR-CON M20 20 MEQ tablet Take 20 mEq by mouth daily.   05/08/2020 at Unknown time  . metoprolol tartrate (LOPRESSOR) 25 MG tablet Take 25 mg by mouth 2 (two) times daily.   05/09/2020 at 0730  . nitroGLYCERIN (NITROSTAT) 0.4 MG SL tablet Place 1 tablet (0.4 mg total) under the tongue every 5 (five) minutes as needed for chest pain. 90 tablet 3 unk  . oxybutynin (DITROPAN) 5 MG tablet Take 5 mg by mouth every 8 (eight) hours as needed for bladder spasms.   2 05/09/2020 at Unknown time  . phenylephrine  (SUDAFED PE) 10 MG TABS tablet Take 10 mg by mouth every 4 (four) hours as needed (sinus congestion).   05/09/2020 at Unknown time  . Prenatal Multivit-Min-Fe-FA (PRENATAL 1 + IRON PO) Take 1 tablet by mouth daily.   05/09/2020 at Unknown time   Allergies:  Allergies  Allergen Reactions  . Lipitor [Atorvastatin Calcium]     Knee pain   . Atorvastatin Other (See Comments)    Joint/knee pain    Family History  Problem Relation Age of Onset  . Healthy Sister   . Healthy Sister   . Stroke Mother   . Colon cancer Neg Hx   . Rectal cancer Neg Hx   . Stomach cancer Neg Hx    Social History:  reports that he quit smoking about 22 years ago. His smoking use included cigarettes. He smoked 2.00 packs per day. He has never used smokeless tobacco. He reports current alcohol use. He reports that he does not use drugs.  ROS: A complete review of systems was performed.  All systems are negative except for pertinent findings as noted. Review of Systems  Unable to perform ROS: Intubated     Physical Exam:  Vital signs in last 24 hours: Temp:  [97.6 F (36.4 C)-98.2 F (36.8 C)] 98.2 F (36.8 C) (11/05 0448) Pulse Rate:  [58-72] 72 (11/05 0448) Resp:  [18] 18 (11/05 0448) BP: (113-134)/(52-78) 134/78 (11/05 0448) SpO2:  [99 %] 99 % (11/05 0448) General: Intubated and on ventilator in OR HEENT: Intubated Cardiovascular: Monitored Lungs: On vent GU: Penis circumcised without mass or lesion.  Meatus appeared normal.  There was blood at the meatus and blood on some of the catheters.  Laboratory Data:  Results for orders placed or performed during the hospital encounter of 05/09/20 (from the past 24 hour(s))  CBC     Status: Abnormal   Collection Time: 05/10/20  4:35 PM  Result Value Ref Range   WBC 5.7 4.0 - 10.5 K/uL   RBC 3.65 (L) 4.22 - 5.81 MIL/uL   Hemoglobin 9.5 (L) 13.0 - 17.0 g/dL   HCT 30.4 (L) 39 - 52 %   MCV 83.3 80.0 - 100.0 fL   MCH 26.0 26.0 - 34.0 pg   MCHC 31.3 30.0 -  36.0 g/dL   RDW 14.8 11.5 - 15.5 %   Platelets 303 150 - 400 K/uL   nRBC 0.0 0.0 - 0.2 %  Comprehensive metabolic panel     Status: Abnormal   Collection Time: 05/10/20  4:35 PM  Result Value Ref Range   Sodium 136 135 - 145 mmol/L   Potassium 4.0 3.5 - 5.1 mmol/L   Chloride  100 98 - 111 mmol/L   CO2 26 22 - 32 mmol/L   Glucose, Bld 104 (H) 70 - 99 mg/dL   BUN 19 8 - 23 mg/dL   Creatinine, Ser 0.97 0.61 - 1.24 mg/dL   Calcium 9.3 8.9 - 10.3 mg/dL   Total Protein 7.2 6.5 - 8.1 g/dL   Albumin 2.7 (L) 3.5 - 5.0 g/dL   AST 17 15 - 41 U/L   ALT 14 0 - 44 U/L   Alkaline Phosphatase 65 38 - 126 U/L   Total Bilirubin 0.5 0.3 - 1.2 mg/dL   GFR, Estimated >60 >60 mL/min   Anion gap 10 5 - 15  Protime-INR     Status: None   Collection Time: 05/10/20  4:35 PM  Result Value Ref Range   Prothrombin Time 14.2 11.4 - 15.2 seconds   INR 1.2 0.8 - 1.2  APTT     Status: Abnormal   Collection Time: 05/10/20  4:35 PM  Result Value Ref Range   aPTT 45 (H) 24 - 36 seconds  Urinalysis, Routine w reflex microscopic Urine, Clean Catch     Status: None   Collection Time: 05/10/20  4:35 PM  Result Value Ref Range   Color, Urine YELLOW YELLOW   APPearance CLEAR CLEAR   Specific Gravity, Urine 1.021 1.005 - 1.030   pH 6.0 5.0 - 8.0   Glucose, UA NEGATIVE NEGATIVE mg/dL   Hgb urine dipstick NEGATIVE NEGATIVE   Bilirubin Urine NEGATIVE NEGATIVE   Ketones, ur NEGATIVE NEGATIVE mg/dL   Protein, ur NEGATIVE NEGATIVE mg/dL   Nitrite NEGATIVE NEGATIVE   Leukocytes,Ua NEGATIVE NEGATIVE  Type and screen [place]     Status: None   Collection Time: 05/10/20  4:39 PM  Result Value Ref Range   ABO/RH(D) O POS    Antibody Screen NEG    Sample Expiration      05/13/2020,2359 Performed at [facility], [place] [address] [place], [place] [address]   Blood gas, arterial on room air     Status: Abnormal   Collection Time: 05/10/20  5:15 PM  Result Value Ref Range   FIO2 21.00     pH, Arterial 7.467 (H) 7.35 - 7.45   pCO2 arterial 33.6 32 - 48 mmHg   pO2, Arterial 92.6 83 - 108 mmHg   Bicarbonate 24.0 20.0 - 28.0 mmol/L   Acid-Base Excess 0.6 0.0 - 2.0 mmol/L   O2 Saturation 97.4 %   Patient temperature 37.0    Collection site RIGHT RADIAL    Drawn by  [phone]    Sample type ARTERIAL DRAW    Allens test (pass/fail) PASS PASS  Surgical pcr screen     Status: None   Collection Time: 05/10/20  8:48 PM   Specimen: Nasal Mucosa; Nasal Swab  Result Value Ref Range   MRSA, PCR NEGATIVE NEGATIVE   Staphylococcus aureus NEGATIVE NEGATIVE  CBC with Differential/Platelet     Status: Abnormal   Collection Time: 05/11/20  4:01 AM  Result Value Ref Range   WBC 4.9 4.0 - 10.5 K/uL   RBC 3.71 (L) 4.22 - 5.81 MIL/uL   Hemoglobin 9.9 (L) 13.0 - 17.0 g/dL   HCT 31.1 (L) 39 - 52 %   MCV 83.8 80.0 - 100.0 fL   MCH 26.7 26.0 - 34.0 pg   MCHC 31.8 30.0 - 36.0 g/dL   RDW 14.9 11.5 - 15.5 %   Platelets 299 150 - 400 K/uL  nRBC 0.0 0.0 - 0.2 %   Neutrophils Relative % 53 %   Neutro Abs 2.6 1.7 - 7.7 K/uL   Lymphocytes Relative 33 %   Lymphs Abs 1.6 0.7 - 4.0 K/uL   Monocytes Relative 11 %   Monocytes Absolute 0.5 0.1 - 1.0 K/uL   Eosinophils Relative 2 %   Eosinophils Absolute 0.1 0.0 - 0.5 K/uL   Basophils Relative 1 %   Basophils Absolute 0.0 0.0 - 0.1 K/uL   Immature Granulocytes 0 %   Abs Immature Granulocytes 0.01 0.00 - 0.07 K/uL  Comprehensive metabolic panel     Status: Abnormal   Collection Time: 05/11/20  4:01 AM  Result Value Ref Range   Sodium 136 135 - 145 mmol/L   Potassium 4.2 3.5 - 5.1 mmol/L   Chloride 103 98 - 111 mmol/L   CO2 24 22 - 32 mmol/L   Glucose, Bld 111 (H) 70 - 99 mg/dL   BUN 15 8 - 23 mg/dL   Creatinine, Ser 0.90 0.61 - 1.24 mg/dL   Calcium 9.3 8.9 - 10.3 mg/dL   Total Protein 7.0 6.5 - 8.1 g/dL   Albumin 2.7 (L) 3.5 - 5.0 g/dL   AST 14 (L) 15 - 41 U/L   ALT 13 0 - 44 U/L   Alkaline Phosphatase 60 38 - 126 U/L   Total Bilirubin 0.6  0.3 - 1.2 mg/dL   GFR, Estimated >60 >60 mL/min   Anion gap 9 5 - 15  C-reactive protein     Status: Abnormal   Collection Time: 05/11/20  4:01 AM  Result Value Ref Range   CRP 2.5 (H) <1.0 mg/dL  Sedimentation rate     Status: Abnormal   Collection Time: 05/11/20  4:01 AM  Result Value Ref Range   Sed Rate 83 (H) 0 - 16 mm/hr  Protime-INR     Status: None   Collection Time: 05/11/20  4:01 AM  Result Value Ref Range   Prothrombin Time 13.5 11.4 - 15.2 seconds   INR 1.1 0.8 - 1.2   Recent Results (from the past 240 hour(s))  Culture, blood (routine x 2)     Status: None (Preliminary result)   Collection Time: 05/09/20  2:45 PM   Specimen: BLOOD  Result Value Ref Range Status   Specimen Description BLOOD RIGHT ANTECUBITAL  Final   Special Requests   Final    BOTTLES DRAWN AEROBIC AND ANAEROBIC Blood Culture adequate volume   Culture   Final    NO GROWTH 2 DAYS Performed at [facility], [address], [place], [place] [address]    Report Status PENDING  Incomplete  Resp Panel by RT PCR (RSV, Flu A&B, Covid) - Nasopharyngeal Swab     Status: None   Collection Time: 05/09/20  2:54 PM   Specimen: Nasopharyngeal Swab  Result Value Ref Range Status   SARS Coronavirus 2 by RT PCR NEGATIVE NEGATIVE Final    Comment: (NOTE) SARS-CoV-2 target nucleic acids are NOT DETECTED.  The SARS-CoV-2 RNA is generally detectable in upper respiratoy specimens during the acute phase of infection. The lowest concentration of SARS-CoV-2 viral copies this assay can detect is 131 copies/mL. A negative result does not preclude SARS-Cov-2 infection and should not be used as the sole basis for treatment or other patient management decisions. A negative result may occur with  improper specimen collection/handling, submission of specimen other than nasopharyngeal swab, presence of viral mutation(s) within the areas targeted by  this assay, and inadequate number of viral copies (<131 copies/mL). A  negative result must be combined with clinical observations, patient history, and epidemiological information. The expected result is Negative.  Fact Sheet for Patients:  PinkCheek.be  Fact Sheet for Healthcare Providers:  GravelBags.it  This test is no t yet approved or cleared by the [place] FDA and  has been authorized for detection and/or diagnosis of SARS-CoV-2 by FDA under an Emergency Use Authorization (EUA). This EUA will remain  in effect (meaning this test can be used) for the duration of the COVID-19 declaration under Section 564(b)(1) of the Act, 21 U.S.C. section 360bbb-3(b)(1), unless the authorization is terminated or revoked sooner.     Influenza A by PCR NEGATIVE NEGATIVE Final   Influenza B by PCR NEGATIVE NEGATIVE Final    Comment: (NOTE) The Xpert Xpress SARS-CoV-2/FLU/RSV assay is intended as an aid in  the diagnosis of influenza from Nasopharyngeal swab specimens and  should not be used as a sole basis for treatment. Nasal washings and  aspirates are unacceptable for Xpert Xpress SARS-CoV-2/FLU/RSV  testing.  Fact Sheet for Patients: PinkCheek.be  Fact Sheet for Healthcare Providers: GravelBags.it  This test is not yet approved or cleared by the [place] FDA and  has been authorized for detection and/or diagnosis of SARS-CoV-2 by  FDA under an Emergency Use Authorization (EUA). This EUA will remain  in effect (meaning this test can be used) for the duration of the  Covid-19 declaration under Section 564(b)(1) of the Act, 21  U.S.C. section 360bbb-3(b)(1), unless the authorization is  terminated or revoked.    Respiratory Syncytial Virus by PCR NEGATIVE NEGATIVE Final    Comment: (NOTE) Fact Sheet for Patients: PinkCheek.be  Fact Sheet for Healthcare  Providers: GravelBags.it  This test is not yet approved or cleared by the [place] FDA and  has been authorized for detection and/or diagnosis of SARS-CoV-2 by  FDA under an Emergency Use Authorization (EUA). This EUA will remain  in effect (meaning this test can be used) for the duration of the  COVID-19 declaration under Section 564(b)(1) of the Act, 21 U.S.C.  section 360bbb-3(b)(1), unless the authorization is terminated or  revoked. Performed at [facility], [place] [address] [place], [place] [address]   Culture, blood (routine x 2)     Status: None (Preliminary result)   Collection Time: 05/10/20  1:44 AM   Specimen: BLOOD  Result Value Ref Range Status   Specimen Description BLOOD SITE NOT SPECIFIED  Final   Special Requests   Final    BOTTLES DRAWN AEROBIC AND ANAEROBIC Blood Culture results may not be optimal due to an excessive volume of blood received in culture bottles   Culture   Final    NO GROWTH 1 DAY Performed at [facility], [place] [address] [place], [place] [address]    Report Status PENDING  Incomplete  Surgical pcr screen     Status: None   Collection Time: 05/10/20  8:48 PM   Specimen: Nasal Mucosa; Nasal Swab  Result Value Ref Range Status   MRSA, PCR NEGATIVE NEGATIVE Final   Staphylococcus aureus NEGATIVE NEGATIVE Final    Comment: (NOTE) The Xpert SA Assay (FDA approved for NASAL specimens in patients 40 years of age and older), is one component of a comprehensive surveillance program. It is not intended to diagnose infection nor to guide or monitor treatment. Performed at [facility], [place] [address], [place], [place]  [address]    Creatinine: Recent Labs    05/09/20 1104 05/10/20 1635 05/11/20 0401  CREATININE 1.00 0.97 0.90    Impression/Assessment/plan:  Difficult Foley with blood at meatus, weak stream - Will proceed with Foley catheter placement and flexible cystoscopy if  needed.  Festus Aloe 05/11/2020, 9:36 AM

## 2020-05-11 NOTE — Plan of Care (Signed)

## 2020-05-11 NOTE — Brief Op Note (Addendum)
05/11/2020  10:40 AM  PATIENT:  Mathew Hall  79 y.o. male  PRE-OPERATIVE DIAGNOSIS:  RIGHT EMPYEMA NECESSITANS  POST-OPERATIVE DIAGNOSIS:  RIGHT EMPYEMA NECESSITANS  PROCEDURE:  Procedure(s): THORACOTOMY (Right) EMPYEMA DRAINAGE (Right) RIB RESECTION (Right) APPLICATION OF WOUND VAC (Right)  SURGEON:  Surgeon(s) and Role:    * [name], MD - Primary    * Festus Aloe, MD - (Urology)  Cystoscopy, complex Foley catheter  insertion  PHYSICIAN ASSISTANT:  Enid Cutter, PA-C  ANESTHESIA:   general  EBL:  25 mL   BLOOD ADMINISTERED:none  DRAINS: Wound vac right pleural space.  LOCAL MEDICATIONS USED:  NONE  SPECIMEN:  Source of Specimen:  Abscess fluid, pleural fluid, right parietal pleura  DISPOSITION OF SPECIMEN:  pathology and microbiology  COUNTS:  YES  DICTATION: -  PLAN OF CARE: Admit to inpatient   PATIENT DISPOSITION:  PACU - hemodynamically stable.   Delay start of Pharmacological VTE agent (>24hrs) due to surgical blood loss or risk of bleeding: yes

## 2020-05-11 NOTE — OR Nursing (Signed)
Foley insertion was attempted a couple of times by at least three different nurses. Dr. Roxan Hockey was notified and Dr. Junious Silk was called at [facility].   Patient's wedding band was removed by circulating RN once in the OR room and placed into a sealed bag inside the patient's chart.

## 2020-05-11 NOTE — Hospital Course (Addendum)
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  He had subsequent chemotherapy and radiation.  He is followed routinely by Dr. Earlie Server.  He was admitted to an outside hospital for 10 days in mid September of this year with a right-sided empyema that was managed primarily by the pulmonary medicine and internal medicine services with pleural drainage and IV antibiotics.   He was covered with a 3-week course of oral antibiotics following discharge for pansensitive strept intermedius cultured from the pleural drainage during that admission. During that admission in September, he was also incidentally found to be COVID positive.  He did not present with any Covid-related symptoms but was found to be positive on routine screening.  He was treated with oral steroids and remdesivir.  He reports that he lost his taste for about 3 weeks and as result lost about 20 pounds.  His taste has returned and his appetite is currently improving. Mathew Hall recently sought medical attention from his pulmonologist after discovering a "lump" on the right side of his lower back at the site where the previous chest tube has been placed.  He was seen by Dr. Freda Jackson 2 days ago and a CT scan of the chest was obtained.  This demonstrated recurrent right-sided posterior loculated pleural effusion.  This appears to communicate with a fluid collection in the subcutaneous tissue corresponding to the lump the patient was sensing.  Mathew Hall presented to the ED earlier today for further evaluation.  He was found to have stable vital signs with no hypoxia and was afebrile.  His white blood count was 6400.  He denies having any shortness of breath,  chills, or cough.  He does have some minor discomfort around the lump in his right back.  Dr. Roxan Hockey reviewed CT images again and did not think there is any way we would get an acceptable result with tube drainage. He suggested the best option is a limited right thoracotomy for drainage of the chest wall collection and the pleural collection. Will decorticate if that appears to be prudent at the time of surgery but will probably be looking at a modified Eloesser/ Claggett type procedure with a wound VAC.    Hospital Course: Mathew Hall was continued on IV Unasyn. He remained afebrile and essentially asymptomatic. He was taken to the OR on 05/11/20 where a limited right thoracotomy was performed with drainage of the empyema, resection of a segment of one rib, and placement of a wound vac. Following the procedure he was transferred to [facility] care. The IV Unasyn was continued. The wound vac was initially set to -48mmHg suction.   He has remained quite stable over the weekend of 11/12-13 continuing his Unasyn and plan is for VAC change early next week.

## 2020-05-11 NOTE — Progress Notes (Signed)
[facility] Progress Note  Patient: Mathew Hall    VOZ:[id number]  DOA: 05/09/2020     Date of Service: the patient was seen and examined on 05/11/2020  Brief hospital course: Past medcal history of recent COVID-19 infection with empyema treated with antibiotics, HTN, GERD, CAD SP PCI.  Presents with complaints of worsening back pain.  Found to have recurrent empyema and failed outpatient treatment. 11/5 thoracotomy with empyema drainage and rib resection as well as application of wound VAC. Foley catheter placement in OR. Currently plan is continue postop course follow-up on cultures.  Assessment and Plan: Empyema necessitans right chest Treated with chest tube and IV antibiotics in 9/21. Followed by oral antibiotics outpatient. Presents with worsening chest pain and found to have persistent subcutaneous mass. CT scan shows evidence of persistent empyema. Pulmonary and cardiothoracic surgery were consulted. Underwent thoracotomy with empyema drainage and liposuction as well as application of wound VAC. We will follow up on cultures. Continue with IV Unasyn for now. Currently transferred to progressive care unit.  History of Metastatic nonsmall cell lung cancer, adenocarcinoma Right Lung cancer in the past  15 yrs ago, Dr Inda Merlin)  S/p neoadjuvant chemotherapy followed by right middle lobectomy with lymph node dissection.  Essential hypertension.  - On Norvasc,  Beta-blocker were stopped during last admission in September.  Patient still appears to be back on beta-blocker with bradycardia and therefore I will discontinue the beta-blocker. Patient reports that he lost his 20 pounds in the hospital and because of that his blood pressure is significantly better.  History of CAD.  Chest pain-free no acute issues,beta-blockers has been stopped.  Resume aspirin. Statin.  Severe protein calorie malnutrition. Dietary consultation.  Body mass index is 22.29 kg/m.    Nutrition Problem: Severe Malnutrition Etiology: acute illness (recurrent rt empyema) Interventions: Interventions: Ensure Enlive (each supplement provides 350kcal and 20 grams of protein), MVI      Diet: Regular diet DVT Prophylaxis:   enoxaparin (LOVENOX) injection 40 mg Start: 05/12/20 2000 SCD's Start: 05/11/20 1146    Advance goals of care discussion: Full code  Family Communication: no family was present at bedside, at the time of interview.   Disposition:  Status is: Inpatient  Remains inpatient appropriate because:IV treatments appropriate due to intensity of illness or inability to take PO   Dispo: The patient is from: Home              Anticipated d/c is to: SNF              Anticipated d/c date is: 3 days              Patient currently is not medically stable to d/c.  Subjective: No nausea no vomiting. Seen after the surgery. Somewhat confused after the surgery due to anesthesia. No fever no chills. Pain well controlled for now.  Physical Exam:  General: Appear in mild distress, no Rash; Oral Mucosa Clear, moist. no Abnormal Neck Mass Or lumps, Conjunctiva normal  Cardiovascular: S1 and S2 Present, no Murmur, Respiratory: good respiratory effort, Bilateral Air entry present and right Crackles, no wheezes Abdomen: Bowel Sound present, Soft and no tenderness Extremities: no Pedal edema Neurology: alert and oriented to time, place, and person affect appropriate. no new focal deficit Gait not checked due to patient safety concerns  Vitals:   05/11/20 1345 05/11/20 1400 05/11/20 1415 05/11/20 1500  BP: 114/68 117/81 119/74 116/86  Pulse: 91 93 95 93  Resp: 20 19 20  16  Temp:   (!) 97.1 F (36.2 C) 97.7 F (36.5 C)  TempSrc:    Axillary  SpO2: 97% 99% 99% 98%  Weight:      Height:        Intake/Output Summary (Last 24 hours) at 05/11/2020 1739 Last data filed at 05/11/2020 1500 Gross per 24 hour  Intake 1829.87 ml  Output 1126 ml  Net 703.87 ml    Filed Weights   05/10/20 0731  Weight: 66.5 kg    Data Reviewed: I have personally reviewed and interpreted daily labs, tele strips, imagings as discussed above. I reviewed all nursing notes, pharmacy notes, vitals, pertinent old records I have discussed plan of care as described above with RN and patient/family.  CBC: Recent Labs  Lab 05/09/20 1104 05/10/20 0131 05/10/20 1635 05/11/20 0401  WBC 6.4 6.1 5.7 4.9  NEUTROABS  --   --   --  2.6  HGB 10.6* 9.3* 9.5* 9.9*  HCT 34.7* 29.8* 30.4* 31.1*  MCV 85.7 83.5 83.3 83.8  PLT 360 290 303 409   Basic Metabolic Panel: Recent Labs  Lab 05/09/20 1104 05/10/20 1635 05/11/20 0401  NA 137 136 136  K 4.2 4.0 4.2  CL 100 100 103  CO2 26 26 24   GLUCOSE 109* 104* 111*  BUN 11 19 15   CREATININE 1.00 0.97 0.90  CALCIUM 9.5 9.3 9.3    Studies: DG CHEST PORT 1 VIEW  Result Date: 05/11/2020 CLINICAL DATA:  Postop check EXAM: PORTABLE CHEST 1 VIEW COMPARISON:  Portable exam 1317 hours compared to 05/07/2020 FINDINGS: Normal heart size, mediastinal contours, and pulmonary vascularity. Slightly rotated to the RIGHT. Postsurgical changes at inferior RIGHT hemithorax. Remaining lungs clear. No pleural effusion or pneumothorax. Bones demineralized. IMPRESSION: Postsurgical changes of the inferior RIGHT chest, stable. No acute abnormalities. Electronically Signed   By: Lavonia Dana M.D.   On: 05/11/2020 14:50    Scheduled Meds: . [START ON 05/12/2020] amLODipine  5 mg Oral Daily  . aspirin  325 mg Oral Daily  . bisacodyl  10 mg Oral Daily  . bupivacaine liposome  20 mL Infiltration Once  . Chlorhexidine Gluconate Cloth  6 each Topical Daily  . [START ON 05/12/2020] enoxaparin (LOVENOX) injection  40 mg Subcutaneous Q24H  . feeding supplement  237 mL Oral BID BM  . multivitamin with minerals  1 tablet Oral Daily  . pantoprazole  40 mg Oral Daily  . potassium chloride SA  20 mEq Oral Daily  . senna-docusate  1 tablet Oral BID  .  Vitamin D (Ergocalciferol)  50,000 Units Oral Weekly   Continuous Infusions: . sodium chloride 75 mL/hr at 05/11/20 1311  . ampicillin-sulbactam (UNASYN) IV 1.5 g (05/11/20 1656)   PRN Meds: acetaminophen **OR** acetaminophen, HYDROcodone-acetaminophen, ondansetron (ZOFRAN) IV, oxybutynin, oxyCODONE, traMADol  Time spent: 35 minutes  Author: Berle Mull, MD [facility] 05/11/2020 5:39 PM  To reach On-call, see care teams to locate the attending and reach out via [email or web address]. Between 7PM-7AM, please contact night-coverage If you still have difficulty reaching the attending provider, please page the [facility] (Director on Call) for [facility] on amion for assistance.

## 2020-05-11 NOTE — Anesthesia Procedure Notes (Signed)
Arterial Line Insertion Start/End11/11/2019 7:00 AM, 05/11/2020 7:05 AM Performed by: Clearnce Sorrel, CRNA  Patient location: Pre-op. Preanesthetic checklist: patient identified, IV checked, site marked, risks and benefits discussed, surgical consent, monitors and equipment checked, pre-op evaluation, timeout performed and anesthesia consent Lidocaine 1% used for infiltration Left, radial was placed Catheter size: 20 Fr Hand hygiene performed  and maximum sterile barriers used   Attempts: 1 Procedure performed without using ultrasound guided technique. Following insertion, dressing applied. Post procedure assessment: normal and unchanged

## 2020-05-11 NOTE — Anesthesia Preprocedure Evaluation (Signed)
Anesthesia Evaluation  Patient identified by MRN, date of birth, ID band Patient awake    Reviewed: Allergy & Precautions, H&P , NPO status , Patient's Chart, lab work & pertinent test results  Airway Mallampati: II   Neck ROM: full    Dental   Pulmonary asthma , COPD, former smoker,  empyema   breath sounds clear to auscultation       Cardiovascular hypertension, + CAD and + Cardiac Stents   Rhythm:regular Rate:Normal     Neuro/Psych    GI/Hepatic GERD  ,  Endo/Other    Renal/GU      Musculoskeletal   Abdominal   Peds  Hematology  (+) anemia ,   Anesthesia Other Findings   Reproductive/Obstetrics                             Anesthesia Physical Anesthesia Plan  ASA: III  Anesthesia Plan: General   Post-op Pain Management:    Induction: Intravenous  PONV Risk Score and Plan: 2 and Ondansetron, Dexamethasone and Treatment may vary due to age or medical condition  Airway Management Planned: Double Lumen EBT  Additional Equipment: Arterial line  Intra-op Plan:   Post-operative Plan: Extubation in OR  Informed Consent: I have reviewed the patients History and Physical, chart, labs and discussed the procedure including the risks, benefits and alternatives for the proposed anesthesia with the patient or authorized representative who has indicated his/her understanding and acceptance.       Plan Discussed with: CRNA, Anesthesiologist and Surgeon  Anesthesia Plan Comments:         Anesthesia Quick Evaluation

## 2020-05-11 NOTE — Interval H&P Note (Signed)
History and Physical Interval Note:  05/11/2020 7:14 AM  Mathew Hall  has presented today for surgery, with the diagnosis of RIGHT EMPYEMA.  The various methods of treatment have been discussed with the patient and family. After consideration of risks, benefits and other options for treatment, the patient has consented to  Procedure(s): THORACOTOMY (Right) EMPYEMA DRAINAGE (Right) possible RIB RESECTION (Right) possible APPLICATION OF WOUND VAC (Right) as a surgical intervention.  The patient's history has been reviewed, patient examined, no change in status, stable for surgery.  I have reviewed the patient's chart and labs.  Questions were answered to the patient's satisfaction.     [name]

## 2020-05-12 ENCOUNTER — Inpatient Hospital Stay (HOSPITAL_COMMUNITY): Payer: Medicare Other

## 2020-05-12 DIAGNOSIS — J869 Pyothorax without fistula: Secondary | ICD-10-CM | POA: Diagnosis not present

## 2020-05-12 DIAGNOSIS — J860 Pyothorax with fistula: Secondary | ICD-10-CM | POA: Diagnosis not present

## 2020-05-12 LAB — CBC
HCT: 28.8 % — ABNORMAL LOW (ref 39.0–52.0)
Hemoglobin: 9 g/dL — ABNORMAL LOW (ref 13.0–17.0)
MCH: 26.2 pg (ref 26.0–34.0)
MCHC: 31.3 g/dL (ref 30.0–36.0)
MCV: 83.7 fL (ref 80.0–100.0)
Platelets: 280 10*3/uL (ref 150–400)
RBC: 3.44 MIL/uL — ABNORMAL LOW (ref 4.22–5.81)
RDW: 14.9 % (ref 11.5–15.5)
WBC: 7.6 10*3/uL (ref 4.0–10.5)
nRBC: 0 % (ref 0.0–0.2)

## 2020-05-12 LAB — BASIC METABOLIC PANEL
Anion gap: 7 (ref 5–15)
BUN: 15 mg/dL (ref 8–23)
CO2: 26 mmol/L (ref 22–32)
Calcium: 9.1 mg/dL (ref 8.9–10.3)
Chloride: 103 mmol/L (ref 98–111)
Creatinine, Ser: 0.73 mg/dL (ref 0.61–1.24)
GFR, Estimated: >60 mL/min (ref >60)
Glucose, Bld: 157 mg/dL — ABNORMAL HIGH (ref 70–99)
Potassium: 4.4 mmol/L (ref 3.5–5.1)
Sodium: 136 mmol/L (ref 135–145)

## 2020-05-12 LAB — GLUCOSE, CAPILLARY: Glucose-Capillary: 115 mg/dL — ABNORMAL HIGH (ref 70–99)

## 2020-05-12 MED ORDER — PANTOPRAZOLE SODIUM 40 MG PO TBEC
40.0000 mg | DELAYED_RELEASE_TABLET | Freq: Every day | ORAL | Status: DC
  Administered 2020-05-13 – 2020-05-28 (×16): 40 mg via ORAL
  Filled 2020-05-12 (×16): qty 1

## 2020-05-12 NOTE — Progress Notes (Signed)
[facility] Progress Note  Patient: Mathew Hall    LNL:[id number]  DOA: 05/09/2020     Date of Service: the patient was seen and examined on 05/12/2020  Brief hospital course: Past medcal history of recent COVID-19 infection with empyema treated with antibiotics, HTN, GERD, CAD SP PCI. Presents with complaints of worsening back pain. Found to have recurrent empyema and failed outpatient treatment. 11/5 thoracotomy with empyema drainage and rib resection as well as application of wound VAC. Foley catheter placement in OR. Currently plan is continue IV biotics and follow-up on culture.  Assessment and Plan: 1. Empyema necessitans right chest Treated with chest tube and IV antibiotics in 9/21. Followed by oral antibiotics outpatient. Presents with worsening chest pain and found to have persistent subcutaneous mass. CT scan shows evidence of persistent empyema. Pulmonary and cardiothoracic surgery were consulted. Underwent thoracotomy with empyema drainage and liposuction as well as application of wound VAC. We will follow up on cultures. Continue with IV Unasyn for now. MRSA PCR negative.   Currently continue progressive care unit. Await guidance from culture results as well as CT surgery for possible.  2. History of Metastatic nonsmall cell lung cancer, adenocarcinoma Right Lung cancer in the past  15 yrs ago, seen by Dr Inda Merlin S/pneoadjuvant chemotherapy followed by right middle lobectomy with lymph node dissection. Currently stable.  Monitor.  Cytology was negative in September from the fluid.  3. Essential hypertension. On Norvasc, Beta-blocker were stopped during last admission in September. Patient still appears to be back on beta-blocker.  Currently with bradycardia and therefore I will discontinue the beta-blocker. Patient reports that he lost his 20 pounds in the hospital and because of that his blood pressure is significantly better.  4. History of  CAD. Chest pain-free no acute issues,beta-blockers has been stopped. Resume aspirin. Statin.  5. Severe protein calorie malnutrition. Dietary consultation.  6.  Anemia secondary to chronic illness No hemoglobin stable.  Monitor.  Continue nutritional supplementation.  Body mass index is 22.09 kg/m.  Nutrition Problem: Severe Malnutrition Etiology: acute illness (recurrent rt empyema) Interventions: Interventions: Ensure Enlive (each supplement provides 350kcal and 20 grams of protein), MVI      Diet: Regular diet DVT Prophylaxis:   enoxaparin (LOVENOX) injection 40 mg Start: 05/12/20 2000 SCD's Start: 05/11/20 1146    Advance goals of care discussion: Full code  Family Communication: no family was present at bedside, at the time of interview.   Disposition:  Status is: Inpatient  Remains inpatient appropriate because:IV treatments appropriate due to intensity of illness or inability to take PO   Dispo: The patient is from: Home              Anticipated d/c is to: SNF              Anticipated d/c date is: > 3 days              Patient currently is not medically stable to d/c.  Subjective: Pain well controlled.  No nausea no vomiting.  Has shortness of breath as well as dry cough.  No fever no chills.  Physical Exam:  General: Appear in mild distress, no Rash; Oral Mucosa Clear, moist. no Abnormal Neck Mass Or lumps, Conjunctiva normal  Cardiovascular: S1 and S2 Present, no Murmur, Respiratory: good respiratory effort, Bilateral Air entry present and bilateral  Crackles, no wheezes Abdomen: Bowel Sound present, Soft and no tenderness Extremities: no Pedal edema Neurology: alert and oriented to time, place, and person  affect appropriate. no new focal deficit Gait not checked due to patient safety concerns  Vitals:   05/12/20 0620 05/12/20 0800 05/12/20 0809 05/12/20 1200  BP:  128/72 128/72   Pulse:  88    Resp:  14  17  Temp:  99.3 F (37.4 C)    TempSrc:   Axillary    SpO2:  96%  94%  Weight: 65.9 kg     Height:        Intake/Output Summary (Last 24 hours) at 05/12/2020 1403 Last data filed at 05/12/2020 0400 Gross per 24 hour  Intake 694.58 ml  Output 950 ml  Net -255.42 ml   Filed Weights   05/10/20 0731 05/12/20 0620  Weight: 66.5 kg 65.9 kg    Data Reviewed: I have personally reviewed and interpreted daily labs, tele strips, imagings as discussed above. I reviewed all nursing notes, pharmacy notes, vitals, pertinent old records I have discussed plan of care as described above with RN and patient/family.  CBC: Recent Labs  Lab 05/09/20 1104 05/10/20 0131 05/10/20 1635 05/11/20 0401 05/12/20 0107  WBC 6.4 6.1 5.7 4.9 7.6  NEUTROABS  --   --   --  2.6  --   HGB 10.6* 9.3* 9.5* 9.9* 9.0*  HCT 34.7* 29.8* 30.4* 31.1* 28.8*  MCV 85.7 83.5 83.3 83.8 83.7  PLT 360 290 303 299 665   Basic Metabolic Panel: Recent Labs  Lab 05/09/20 1104 05/10/20 1635 05/11/20 0401 05/12/20 0107  NA 137 136 136 136  K 4.2 4.0 4.2 4.4  CL 100 100 103 103  CO2 26 26 24 26   GLUCOSE 109* 104* 111* 157*  BUN 11 19 15 15   CREATININE 1.00 0.97 0.90 0.73  CALCIUM 9.5 9.3 9.3 9.1    Studies: DG CHEST PORT 1 VIEW  Result Date: 05/12/2020 CLINICAL DATA:  Status post drainage of right empyema. EXAM: PORTABLE CHEST 1 VIEW COMPARISON:  05/11/2020 FINDINGS: Normal heart size. No pneumothorax identified. Stable postsurgical changes within the right inferior hemithorax. Subsegmental atelectasis noted within the left base. No interstitial edema. No airspace opacities. IMPRESSION: Stable postsurgical changes involving the inferior right hemithorax. No pneumothorax identified. Subsegmental atelectasis noted in the left base. Electronically Signed   By: Kerby Moors M.D.   On: 05/12/2020 08:30    Scheduled Meds: . amLODipine  5 mg Oral Daily  . aspirin  325 mg Oral Daily  . bisacodyl  10 mg Oral Daily  . bupivacaine liposome  20 mL Infiltration  Once  . Chlorhexidine Gluconate Cloth  6 each Topical Daily  . enoxaparin (LOVENOX) injection  40 mg Subcutaneous Q24H  . feeding supplement  237 mL Oral BID BM  . multivitamin with minerals  1 tablet Oral Daily  . [START ON 05/13/2020] pantoprazole  40 mg Oral QAC breakfast  . potassium chloride SA  20 mEq Oral Daily  . senna-docusate  1 tablet Oral BID  . Vitamin D (Ergocalciferol)  50,000 Units Oral Weekly   Continuous Infusions: . ampicillin-sulbactam (UNASYN) IV 1.5 g (05/12/20 1123)   PRN Meds: acetaminophen **OR** acetaminophen, HYDROcodone-acetaminophen, ondansetron (ZOFRAN) IV, oxybutynin, oxyCODONE, traMADol  Time spent: 35 minutes  Author: Berle Mull, MD [facility] 05/12/2020 2:03 PM  To reach On-call, see care teams to locate the attending and reach out via [email or web address]. Between 7PM-7AM, please contact night-coverage If you still have difficulty reaching the attending provider, please page the [facility] (Director on Call) for [facility] on amion for assistance.

## 2020-05-12 NOTE — Progress Notes (Signed)
NAME:  Mathew Hall, MRN:  [id number], DOB:  May 01, 1941, LOS: 3 ADMISSION DATE:  05/09/2020, CONSULTATION DATE:  05/12/20 REFERRING MD:  EDP CHIEF COMPLAINT:  empyema   Brief History   79yo male presented with recurrent shortness of breath and concerns for recurrent empyema. Patient was recently admitted 03/21/20 - 03/31/20 for empyema of right pleural space, she was treated with pigtail chest tube and Rocephin and Keflex for Streptococcus intermedius.   History of present illness   Mathew Hall is a 79yo male with PMX significant for Empyema, former smoker, right adenocarcinoma s/p partial resection and chemo, asthma, CAD S/P PCI, colon polyps, and GERD who presented to the ED at the recommendation of his primary Pulmonologist Dr. Rudi Heap. Patient was seen by Dr. Erin Fulling 11/1 who felt the patient had developed recurrent loculated effusion on the right with concerns of unresolved empyema from the initial presentation. Repeat imaging confirms recurrence. CT chest 11/2 is concerning for empyema with connection to the chest wall through the previous chest tube tract which would explain presence of right chest wall lump.   Case was discussed with cardiothoracic surgery and decision was made admit patient to the hospitalitis service with plan for surgery Friday. PCCM will be available as consult.     Past Medical History  MI 1999 Adenocarcinoma of right lung HTN HLD GERD CAD Asthma   Significant Hospital Events   Admit 11/3  Consults:  TCTS PCCM  Procedures:  11/5 right thoracotomy drainage of empyema rib resection VAC placement.  Significant Diagnostic Tests:  11/2 CT chest>> 1. Interval loculated pleural fluid posteriorly on the right with a surrounding rim of probably enhancing soft tissue and containing gas or air, suspicious for empyema. 2. The loculated right posterior pleural fluid collection has a lateral extension into the right posterolateral chest wall at  the location of the entry tract of the previously demonstrated right chest tube with a heterogeneous area of soft tissue thickening and small fluid collections with extensive adjacent soft tissue edema and stranding. This is compatible with an area of infection with probable small abscesses. 3. Small hiatal hernia. 4. Cholelithiasis. 5. Calcific coronary artery and aortic atherosclerosis.  Micro Data:  11/3 Covid-19, flu>> 11/3 BCx2>>  Antimicrobials:  Ceftriaxone 11/3 Unasyn 11/4-   Interim history/subjective:  No complaints.    Objective   Blood pressure 128/72, pulse 88, temperature 99.3 F (37.4 C), temperature source Axillary, resp. rate 17, height 5\' 8"  (1.727 m), weight 65.9 kg, SpO2 94 %.        Intake/Output Summary (Last 24 hours) at 05/12/2020 1418 Last data filed at 05/12/2020 0400 Gross per 24 hour  Intake 694.58 ml  Output 950 ml  Net -255.42 ml   Filed Weights   05/10/20 0731 05/12/20 0620  Weight: 66.5 kg 65.9 kg   General:  Very pleasant elderly male lying in bed in NAD Neuro: Alert, oriented x3, MAE CV: rrrr, no murmur PULM:  Non labored, lungs clear, remains on room air, VAC dressing in place GI: soft, NT, +bs Extremities: warm/dry, no LE edema  Skin: no rashes   Resolved Hospital Problem list    Assessment & Plan:   R-sided Empyema and posterior mass in the setting of recent Covid PNA and chest tube now status post surgical drainage. Would recommend continuing IV antibiotics until [facility] dressing is removed.  Would then switch to oral amoxicillin clavulanic acid 850 twice daily for for another 5 days.    Labs   CBC:  Recent Labs  Lab 05/09/20 1104 05/10/20 0131 05/10/20 1635 05/11/20 0401 05/12/20 0107  WBC 6.4 6.1 5.7 4.9 7.6  NEUTROABS  --   --   --  2.6  --   HGB 10.6* 9.3* 9.5* 9.9* 9.0*  HCT 34.7* 29.8* 30.4* 31.1* 28.8*  MCV 85.7 83.5 83.3 83.8 83.7  PLT 360 290 303 299 462    Basic Metabolic Panel: Recent Labs  Lab  05/09/20 1104 05/10/20 1635 05/11/20 0401 05/12/20 0107  NA 137 136 136 136  K 4.2 4.0 4.2 4.4  CL 100 100 103 103  CO2 26 26 24 26   GLUCOSE 109* 104* 111* 157*  BUN 11 19 15 15   CREATININE 1.00 0.97 0.90 0.73  CALCIUM 9.5 9.3 9.3 9.1   GFR: Estimated Creatinine Clearance: 70.9 mL/min (by C-G formula based on SCr of 0.73 mg/dL). Recent Labs  Lab 05/10/20 0131 05/10/20 1635 05/11/20 0401 05/12/20 0107  WBC 6.1 5.7 4.9 7.6    Liver Function Tests: Recent Labs  Lab 05/10/20 1635 05/11/20 0401  AST 17 14*  ALT 14 13  ALKPHOS 65 60  BILITOT 0.5 0.6  PROT 7.2 7.0  ALBUMIN 2.7* 2.7*   No results for input(s): LIPASE, AMYLASE in the last 168 hours. No results for input(s): AMMONIA in the last 168 hours.  ABG    Component Value Date/Time   PHART 7.467 (H) 05/10/2020 1715   PCO2ART 33.6 05/10/2020 1715   PO2ART 92.6 05/10/2020 1715   HCO3 24.0 05/10/2020 1715   TCO2 25 03/06/2009 0437   ACIDBASEDEF 0.5 03/01/2009 0922   O2SAT 97.4 05/10/2020 1715     Coagulation Profile: Recent Labs  Lab 05/10/20 1635 05/11/20 0401  INR 1.2 1.1    Cardiac Enzymes: No results for input(s): CKTOTAL, CKMB, CKMBINDEX, TROPONINI in the last 168 hours.  HbA1C: Hgb A1c MFr Bld  Date/Time Value Ref Range Status  03/23/2020 09:03 AM 5.6 4.8 - 5.6 % Final    Comment:    (NOTE) Pre diabetes:          5.7%-6.4%  Diabetes:              >6.4%  Glycemic control for   <7.0% adults with diabetes     CBG: Recent Labs  Lab 05/11/20 1618 05/12/20 [place] Tonyia Marschall, [facility] Physician [place]  Pager: [phone] Mobile: [phone] After hours: [phone].  05/12/2020, 2:21 PM      05/12/2020, 2:18 PM

## 2020-05-12 NOTE — Plan of Care (Signed)
  Problem: Coping: Goal: Level of anxiety will decrease 05/12/2020 0744 by Shanon Ace, RN Outcome: Progressing 05/12/2020 0743 by Shanon Ace, RN Outcome: Progressing   Problem: Pain Managment: Goal: General experience of comfort will improve 05/12/2020 0744 by Shanon Ace, RN Outcome: Progressing 05/12/2020 0743 by Shanon Ace, RN Outcome: Progressing   Problem: Safety: Goal: Ability to remain free from injury will improve 05/12/2020 0744 by Shanon Ace, RN Outcome: Progressing 05/12/2020 0743 by Shanon Ace, RN Outcome: Progressing   Problem: Skin Integrity: Goal: Risk for impaired skin integrity will decrease 05/12/2020 0744 by Shanon Ace, RN Outcome: Progressing 05/12/2020 0743 by Shanon Ace, RN Outcome: Progressing   Problem: Education: Goal: Knowledge of General Education information will improve Description: Including pain rating scale, medication(s)/side effects and non-pharmacologic comfort measures 05/12/2020 0744 by Shanon Ace, RN Outcome: Progressing 05/12/2020 0743 by Shanon Ace, RN Outcome: Progressing   Problem: Health Behavior/Discharge Planning: Goal: Ability to manage health-related needs will improve 05/12/2020 0744 by Shanon Ace, RN Outcome: Progressing 05/12/2020 0743 by Shanon Ace, RN Outcome: Progressing   Problem: Clinical Measurements: Goal: Ability to maintain clinical measurements within normal limits will improve 05/12/2020 0744 by Shanon Ace, RN Outcome: Progressing 05/12/2020 0743 by Shanon Ace, RN Outcome: Progressing Goal: Will remain free from infection 05/12/2020 0744 by Shanon Ace, RN Outcome: Progressing 05/12/2020 0743 by Shanon Ace, RN Outcome: Progressing Goal: Diagnostic test results will improve 05/12/2020 0744 by Shanon Ace, RN Outcome: Progressing 05/12/2020 0743 by Shanon Ace, RN Outcome: Progressing Goal: Respiratory complications will improve 05/12/2020 0744 by Shanon Ace,  RN Outcome: Progressing 05/12/2020 0743 by Shanon Ace, RN Outcome: Progressing Goal: Cardiovascular complication will be avoided 05/12/2020 0744 by Shanon Ace, RN Outcome: Progressing 05/12/2020 0743 by Shanon Ace, RN Outcome: Progressing

## 2020-05-12 NOTE — Plan of Care (Signed)

## 2020-05-12 NOTE — Progress Notes (Signed)
      [address]       [place] [address]             [phone]      1 Day Post-Op Procedure(s) (LRB): THORACOTOMY (Right) EMPYEMA DRAINAGE (Right) RIB RESECTION (Right) APPLICATION OF WOUND VAC (Right)   Subjective:  Patient doing well.  States has some soreness but not really pain.    Objective: Vital signs in last 24 hours: Temp:  [97.1 F (36.2 C)-99.3 F (37.4 C)] 99.3 F (37.4 C) (11/06 0800) Pulse Rate:  [63-95] 88 (11/06 0800) Cardiac Rhythm: Normal sinus rhythm (11/05 2000) Resp:  [12-30] 14 (11/06 0800) BP: (114-128)/(66-86) 128/72 (11/06 0809) SpO2:  [94 %-100 %] 96 % (11/06 0800) Weight:  [65.9 kg] 65.9 kg (11/06 0620)  Intake/Output from previous day: 11/05 0701 - 11/06 0700 In: 2144.6 [I.V.:2144.6] Out: 2075 [Urine:2000; Drains:50; Blood:25]  General appearance: alert, cooperative and no distress Heart: regular rate and rhythm Lungs: clear to auscultation bilaterally Abdomen: soft, non-tender; bowel sounds normal; no masses,  no organomegaly Extremities: extremities normal, atraumatic, no cyanosis or edema Wound: wound vac in place  Lab Results: Recent Labs    05/11/20 0401 05/12/20 0107  WBC 4.9 7.6  HGB 9.9* 9.0*  HCT 31.1* 28.8*  PLT 299 280   BMET:  Recent Labs    05/11/20 0401 05/12/20 0107  NA 136 136  K 4.2 4.4  CL 103 103  CO2 24 26  GLUCOSE 111* 157*  BUN 15 15  CREATININE 0.90 0.73  CALCIUM 9.3 9.1    PT/INR:  Recent Labs    05/11/20 0401  LABPROT 13.5  INR 1.1   ABG    Component Value Date/Time   PHART 7.467 (H) 05/10/2020 1715   HCO3 24.0 05/10/2020 1715   TCO2 25 03/06/2009 0437   ACIDBASEDEF 0.5 03/01/2009 0922   O2SAT 97.4 05/10/2020 1715   CBG (last 3)  Recent Labs    05/11/20 1618  GLUCAP 175*    Assessment/Plan: S/P Procedure(s) (LRB): THORACOTOMY (Right) EMPYEMA DRAINAGE (Right) RIB RESECTION (Right) APPLICATION OF WOUND VAC (Right)  1. S/P Right Thoracotomy with rib  resection and drainage of empyema- patient doing well, denies pain, okay to start Lovenox for DVT prophylaxis 2. ID- afebrile, no leukocytosis- GS shows no bacteria, cultures remain pending on Vanc/ Unasyn 3. Dispo- patient stable, continue current care per medicine, wound vac in place and he is on schedule Monday for wound vac change in operating room with Dr. Roxan Hockey   LOS: 3 days    Ellwood Handler, PA-C 05/12/2020

## 2020-05-13 ENCOUNTER — Encounter (HOSPITAL_COMMUNITY): Payer: Self-pay | Admitting: Thoracic Surgery (Cardiothoracic Vascular Surgery)

## 2020-05-13 DIAGNOSIS — J860 Pyothorax with fistula: Secondary | ICD-10-CM | POA: Diagnosis not present

## 2020-05-13 LAB — COMPREHENSIVE METABOLIC PANEL
ALT: 11 U/L (ref 0–44)
AST: 17 U/L (ref 15–41)
Albumin: 2.5 g/dL — ABNORMAL LOW (ref 3.5–5.0)
Alkaline Phosphatase: 56 U/L (ref 38–126)
Anion gap: 9 (ref 5–15)
BUN: 10 mg/dL (ref 8–23)
CO2: 25 mmol/L (ref 22–32)
Calcium: 9 mg/dL (ref 8.9–10.3)
Chloride: 102 mmol/L (ref 98–111)
Creatinine, Ser: 0.83 mg/dL (ref 0.61–1.24)
GFR, Estimated: >60 mL/min (ref >60)
Glucose, Bld: 98 mg/dL (ref 70–99)
Potassium: 3.9 mmol/L (ref 3.5–5.1)
Sodium: 136 mmol/L (ref 135–145)
Total Bilirubin: 0.5 mg/dL (ref 0.3–1.2)
Total Protein: 6.5 g/dL (ref 6.5–8.1)

## 2020-05-13 LAB — CBC
HCT: 29.7 % — ABNORMAL LOW (ref 39.0–52.0)
Hemoglobin: 9.4 g/dL — ABNORMAL LOW (ref 13.0–17.0)
MCH: 26.8 pg (ref 26.0–34.0)
MCHC: 31.6 g/dL (ref 30.0–36.0)
MCV: 84.6 fL (ref 80.0–100.0)
Platelets: 285 10*3/uL (ref 150–400)
RBC: 3.51 MIL/uL — ABNORMAL LOW (ref 4.22–5.81)
RDW: 15.6 % — ABNORMAL HIGH (ref 11.5–15.5)
WBC: 6.1 10*3/uL (ref 4.0–10.5)
nRBC: 0 % (ref 0.0–0.2)

## 2020-05-13 MED ORDER — LACTULOSE 10 GM/15ML PO SOLN
20.0000 g | Freq: Two times a day (BID) | ORAL | Status: DC
  Administered 2020-05-13 (×2): 20 g via ORAL
  Filled 2020-05-13 (×2): qty 30

## 2020-05-13 NOTE — Progress Notes (Signed)
[facility] Progress Note  Patient: Mathew Hall    IWP:[id number]  DOA: 05/09/2020     Date of Service: the patient was seen and examined on 05/13/2020  Brief hospital course: Past medcal history of recent COVID-19 infection with empyema treated with antibiotics, HTN, GERD, CAD SP PCI. Presents with complaints of worsening back pain. Found to have recurrent empyema and failed outpatient treatment. 11/5 thoracotomy with empyema drainage and rib resection as well as application of wound VAC. Foley catheter placement in OR. Currently plan is continue IV biotics and follow-up on culture.  Wound VAC change in OR tomorrow.  Assessment and Plan: 1. Empyema necessitans right chest Treated with chest tube and IV antibiotics in 9/21. Followed by oral antibiotics outpatient. Presents with worsening chest pain and found to have persistent subcutaneous mass. CT scan shows evidence of persistent empyema. Pulmonary and cardiothoracic surgery were consulted. Underwent thoracotomy with empyema drainage and liposuction as well as application of wound VAC. Wound VAC change in OR tomorrow. We will follow up on cultures. Continue with IV Unasyn for now.  Transition to p.o. once wound VAC is off. MRSA PCR negative.   Currently continue progressive care unit. Await guidance from culture results, so far negative.  2. History of Metastatic nonsmall cell lung cancer, adenocarcinoma Right Lung cancer in the past  15 yrs ago, seen by Dr Inda Merlin S/pneoadjuvant chemotherapy followed by right middle lobectomy with lymph node dissection. Currently stable.  Monitor.  Cytology was negative in September from the fluid.  3. Essential hypertension. On Norvasc, Beta-blocker were stopped during last admission in September. Patient still appears to be back on beta-blocker.  Currently with bradycardia and therefore I will discontinue the beta-blocker. Patient reports that he lost his 20 pounds in the  hospital and because of that his blood pressure is significantly better.  4. History of CAD. Chest pain-free no acute issues,beta-blockers has been stopped. Resume aspirin. Statin.  5. Severe protein calorie malnutrition. Dietary consultation.  6.  Anemia secondary to chronic illness No hemoglobin stable.  Monitor.  Continue nutritional supplementation.  Body mass index is 22.09 kg/m.  Nutrition Problem: Severe Malnutrition Etiology: acute illness (recurrent rt empyema) Interventions: Interventions: Ensure Enlive (each supplement provides 350kcal and 20 grams of protein), MVI      Diet: Regular diet DVT Prophylaxis:   enoxaparin (LOVENOX) injection 40 mg Start: 05/12/20 2000 SCD's Start: 05/11/20 1146    Advance goals of care discussion: Full code  Family Communication: no family was present at bedside, at the time of interview.   Disposition:  Status is: Inpatient  Remains inpatient appropriate because:IV treatments appropriate due to intensity of illness or inability to take PO   Dispo: The patient is from: Home              Anticipated d/c is to: SNF              Anticipated d/c date is: > 3 days              Patient currently is not medically stable to d/c.  Subjective: Pain well controlled.  No nausea no vomiting.  Wound VAC fell off last night.  No fever no chills.  Physical Exam:  General: Appear in mild distress, no Rash; Oral Mucosa Clear, moist. no Abnormal Neck Mass Or lumps, Conjunctiva normal  Cardiovascular: S1 and S2 Present, no Murmur, Respiratory: good respiratory effort, Bilateral Air entry present and bilateral  Crackles, no wheezes Abdomen: Bowel Sound present, Soft and  no tenderness Extremities: no Pedal edema Neurology: alert and oriented to time, place, and person affect appropriate. no new focal deficit Gait not checked due to patient safety concerns  Vitals:   05/13/20 0549 05/13/20 0800 05/13/20 0951 05/13/20 0955  BP: 128/81  122/82 132/70 132/70  Pulse: 76 73  92  Resp: (!) 24 14  20   Temp: 97.6 F (36.4 C) 97.8 F (36.6 C)  98 F (36.7 C)  TempSrc: Oral Oral  Oral  SpO2: 97% 96%  97%  Weight:      Height:        Intake/Output Summary (Last 24 hours) at 05/13/2020 1625 Last data filed at 05/13/2020 1251 Gross per 24 hour  Intake 480 ml  Output 2025 ml  Net -1545 ml   Filed Weights   05/10/20 0731 05/12/20 0620  Weight: 66.5 kg 65.9 kg    Data Reviewed: I have personally reviewed and interpreted daily labs, tele strips, imagings as discussed above. I reviewed all nursing notes, pharmacy notes, vitals, pertinent old records I have discussed plan of care as described above with RN and patient/family.  CBC: Recent Labs  Lab 05/10/20 0131 05/10/20 1635 05/11/20 0401 05/12/20 0107 05/13/20 0829  WBC 6.1 5.7 4.9 7.6 6.1  NEUTROABS  --   --  2.6  --   --   HGB 9.3* 9.5* 9.9* 9.0* 9.4*  HCT 29.8* 30.4* 31.1* 28.8* 29.7*  MCV 83.5 83.3 83.8 83.7 84.6  PLT 290 303 299 280 092   Basic Metabolic Panel: Recent Labs  Lab 05/09/20 1104 05/10/20 1635 05/11/20 0401 05/12/20 0107 05/13/20 0829  NA 137 136 136 136 136  K 4.2 4.0 4.2 4.4 3.9  CL 100 100 103 103 102  CO2 26 26 24 26 25   GLUCOSE 109* 104* 111* 157* 98  BUN 11 19 15 15 10   CREATININE 1.00 0.97 0.90 0.73 0.83  CALCIUM 9.5 9.3 9.3 9.1 9.0    Studies: No results found.  Scheduled Meds:  amLODipine  5 mg Oral Daily   aspirin  325 mg Oral Daily   bisacodyl  10 mg Oral Daily   bupivacaine liposome  20 mL Infiltration Once   Chlorhexidine Gluconate Cloth  6 each Topical Daily   enoxaparin (LOVENOX) injection  40 mg Subcutaneous Q24H   feeding supplement  237 mL Oral BID BM   lactulose  20 g Oral BID   multivitamin with minerals  1 tablet Oral Daily   pantoprazole  40 mg Oral QAC breakfast   potassium chloride SA  20 mEq Oral Daily   senna-docusate  1 tablet Oral BID   Vitamin D (Ergocalciferol)  50,000 Units Oral  Weekly   Continuous Infusions:  ampicillin-sulbactam (UNASYN) IV 1.5 g (05/13/20 1251)   PRN Meds: acetaminophen **OR** acetaminophen, HYDROcodone-acetaminophen, ondansetron (ZOFRAN) IV, oxybutynin, oxyCODONE, traMADol  Time spent: 35 minutes  Author: Berle Mull, MD [facility] 05/13/2020 4:25 PM  To reach On-call, see care teams to locate the attending and reach out via [email or web address]. Between 7PM-7AM, please contact night-coverage If you still have difficulty reaching the attending provider, please page the [facility] (Director on Call) for [facility] on amion for assistance.

## 2020-05-13 NOTE — Progress Notes (Addendum)
      [address]       [place] [address]             [phone]      2 Days Post-Op Procedure(s) (LRB): THORACOTOMY (Right) EMPYEMA DRAINAGE (Right) RIB RESECTION (Right) APPLICATION OF WOUND VAC (Right)   Subjective:  No new complaints.  He states some dressing came off on his back last night and they had to replace it.  Incision area remains sore  Objective: Vital signs in last 24 hours: Temp:  [97.6 F (36.4 C)-97.9 F (36.6 C)] 97.8 F (36.6 C) (11/07 0800) Pulse Rate:  [67-92] 92 (11/07 0955) Cardiac Rhythm: Normal sinus rhythm (11/06 2030) Resp:  [14-24] 20 (11/07 0955) BP: (116-132)/(70-97) 132/70 (11/07 0955) SpO2:  [94 %-97 %] 97 % (11/07 0955)  Intake/Output from previous day: 11/06 0701 - 11/07 0700 In: -  Out: 2925 [Urine:2900; Drains:25]  General appearance: alert, cooperative and no distress Heart: regular rate and rhythm Lungs: clear to auscultation bilaterally Abdomen: soft, non-tender; bowel sounds normal; no masses,  no organomegaly Extremities: extremities normal, atraumatic, no cyanosis or edema Wound: wound vac in place  Lab Results: Recent Labs    05/12/20 0107 05/13/20 0829  WBC 7.6 6.1  HGB 9.0* 9.4*  HCT 28.8* 29.7*  PLT 280 285   BMET:  Recent Labs    05/12/20 0107 05/13/20 0829  NA 136 136  K 4.4 3.9  CL 103 102  CO2 26 25  GLUCOSE 157* 98  BUN 15 10  CREATININE 0.73 0.83  CALCIUM 9.1 9.0    PT/INR:  Recent Labs    05/11/20 0401  LABPROT 13.5  INR 1.1   ABG    Component Value Date/Time   PHART 7.467 (H) 05/10/2020 1715   HCO3 24.0 05/10/2020 1715   TCO2 25 03/06/2009 0437   ACIDBASEDEF 0.5 03/01/2009 0922   O2SAT 97.4 05/10/2020 1715   CBG (last 3)  Recent Labs    05/11/20 1618 05/12/20 1209  GLUCAP 175* 115*    Assessment/Plan: S/P Procedure(s) (LRB): THORACOTOMY (Right) EMPYEMA DRAINAGE (Right) RIB RESECTION (Right) APPLICATION OF WOUND VAC (Right)  1. S/P Right  Thoracotomy with rib resection, drainage of empyema.Marland Kitchen wound vac in place.. plans to change in OR tomorrow afternoon 2. ID-afebrile, no leukocytosis.. OR culture shows no growth to date, on IV ABX 3. Dispo- patient stable, continue ABX, for wound vac change in OR tomorrow afternoon with Dr. Roxan Hockey   LOS: 4 days   Ellwood Handler, PA-C 05/13/2020   wound vac "fell off" last night - black sponge and seal replaced, white sponge not replace Dressing change tomorrow  I have seen and examined Fayne Mediate and agree with the above assessment  and plan.  Grace Isaac MD Beeper [phone] Office [phone] 05/13/2020 11:47 AM

## 2020-05-14 ENCOUNTER — Inpatient Hospital Stay (HOSPITAL_COMMUNITY): Payer: Medicare Other | Admitting: Certified Registered"

## 2020-05-14 ENCOUNTER — Encounter (HOSPITAL_COMMUNITY): Payer: Self-pay | Admitting: Thoracic Surgery (Cardiothoracic Vascular Surgery)

## 2020-05-14 ENCOUNTER — Encounter (HOSPITAL_COMMUNITY)
Admission: EM | Disposition: A | Discharge status: Home Health | Payer: Self-pay | Source: Home / Self Care | Attending: Family Medicine

## 2020-05-14 DIAGNOSIS — J869 Pyothorax without fistula: Secondary | ICD-10-CM

## 2020-05-14 DIAGNOSIS — J860 Pyothorax with fistula: Secondary | ICD-10-CM | POA: Diagnosis not present

## 2020-05-14 HISTORY — PX: APPLICATION OF WOUND VAC: SHX5189

## 2020-05-14 LAB — CULTURE, BLOOD (ROUTINE X 2)
Culture: NO GROWTH
Special Requests: ADEQUATE

## 2020-05-14 LAB — SURGICAL PATHOLOGY

## 2020-05-14 SURGERY — APPLICATION, WOUND VAC
Anesthesia: General | Laterality: Right

## 2020-05-14 MED ORDER — CHLORHEXIDINE GLUCONATE 0.12 % MT SOLN: 15.0000 mL | Freq: Once | OROMUCOSAL | Status: AC

## 2020-05-14 MED ORDER — DEXAMETHASONE SODIUM PHOSPHATE 10 MG/ML IJ SOLN
INTRAMUSCULAR | Status: AC
  Filled 2020-05-14: qty 1

## 2020-05-14 MED ORDER — SODIUM CHLORIDE 0.9 % IV SOLN
1.5000 g | INTRAVENOUS | Status: DC
  Filled 2020-05-14: qty 4

## 2020-05-14 MED ORDER — LIDOCAINE 2% (20 MG/ML) 5 ML SYRINGE
INTRAMUSCULAR | Status: AC
  Filled 2020-05-14: qty 5

## 2020-05-14 MED ORDER — SUCCINYLCHOLINE CHLORIDE 200 MG/10ML IV SOSY
PREFILLED_SYRINGE | INTRAVENOUS | Status: DC | PRN
  Administered 2020-05-14: 120 mg via INTRAVENOUS

## 2020-05-14 MED ORDER — POTASSIUM CHLORIDE IN NACL 20-0.45 MEQ/L-% IV SOLN
INTRAVENOUS | Status: DC
  Filled 2020-05-14 (×2): qty 1000

## 2020-05-14 MED ORDER — ONDANSETRON HCL 4 MG/2ML IJ SOLN
4.0000 mg | Freq: Four times a day (QID) | INTRAMUSCULAR | Status: DC | PRN
  Administered 2020-05-21: 4 mg via INTRAVENOUS

## 2020-05-14 MED ORDER — ESMOLOL HCL 100 MG/10ML IV SOLN
INTRAVENOUS | Status: DC | PRN
  Administered 2020-05-14 (×2): 30 mg via INTRAVENOUS

## 2020-05-14 MED ORDER — SUGAMMADEX SODIUM 200 MG/2ML IV SOLN
INTRAVENOUS | Status: DC | PRN
  Administered 2020-05-14: 200 mg via INTRAVENOUS
  Administered 2020-05-14: 50 mg via INTRAVENOUS

## 2020-05-14 MED ORDER — METOPROLOL TARTRATE 25 MG PO TABS
25.0000 mg | ORAL_TABLET | Freq: Two times a day (BID) | ORAL | Status: DC
  Administered 2020-05-15 – 2020-05-18 (×7): 25 mg via ORAL
  Filled 2020-05-14 (×4): qty 1
  Filled 2020-05-14: qty 2
  Filled 2020-05-14 (×2): qty 1

## 2020-05-14 MED ORDER — ROCURONIUM BROMIDE 10 MG/ML (PF) SYRINGE
PREFILLED_SYRINGE | INTRAVENOUS | Status: DC | PRN
  Administered 2020-05-14: 50 mg via INTRAVENOUS

## 2020-05-14 MED ORDER — DEXAMETHASONE SODIUM PHOSPHATE 10 MG/ML IJ SOLN
INTRAMUSCULAR | Status: DC | PRN
  Administered 2020-05-14: 10 mg via INTRAVENOUS

## 2020-05-14 MED ORDER — LIDOCAINE 2% (20 MG/ML) 5 ML SYRINGE
INTRAMUSCULAR | Status: DC | PRN
  Administered 2020-05-14: 80 mg via INTRAVENOUS

## 2020-05-14 MED ORDER — SENNOSIDES-DOCUSATE SODIUM 8.6-50 MG PO TABS
1.0000 | ORAL_TABLET | Freq: Every day | ORAL | Status: DC
  Administered 2020-05-17 – 2020-05-24 (×8): 1 via ORAL
  Filled 2020-05-14 (×9): qty 1

## 2020-05-14 MED ORDER — ACETAMINOPHEN 500 MG PO TABS
1000.0000 mg | ORAL_TABLET | Freq: Four times a day (QID) | ORAL | Status: AC
  Administered 2020-05-14 – 2020-05-19 (×18): 1000 mg via ORAL
  Filled 2020-05-14 (×18): qty 2

## 2020-05-14 MED ORDER — DOCUSATE SODIUM 100 MG PO CAPS
100.0000 mg | ORAL_CAPSULE | Freq: Two times a day (BID) | ORAL | Status: DC
  Administered 2020-05-15 – 2020-05-28 (×24): 100 mg via ORAL
  Filled 2020-05-14 (×25): qty 1

## 2020-05-14 MED ORDER — OXYCODONE HCL 5 MG PO TABS
5.0000 mg | ORAL_TABLET | ORAL | Status: DC | PRN
  Administered 2020-05-14: 10 mg via ORAL
  Filled 2020-05-14: qty 2

## 2020-05-14 MED ORDER — POLYETHYLENE GLYCOL 3350 17 G PO PACK
17.0000 g | PACK | Freq: Every day | ORAL | Status: DC
  Administered 2020-05-15 – 2020-05-28 (×11): 17 g via ORAL
  Filled 2020-05-14 (×11): qty 1

## 2020-05-14 MED ORDER — LACTULOSE 10 GM/15ML PO SOLN
20.0000 g | Freq: Every day | ORAL | Status: DC | PRN
  Administered 2020-05-16 – 2020-05-25 (×2): 20 g via ORAL
  Filled 2020-05-14 (×3): qty 30

## 2020-05-14 MED ORDER — PHENYLEPHRINE 40 MCG/ML (10ML) SYRINGE FOR IV PUSH (FOR BLOOD PRESSURE SUPPORT)
PREFILLED_SYRINGE | INTRAVENOUS | Status: DC | PRN
  Administered 2020-05-14 (×2): 80 ug via INTRAVENOUS

## 2020-05-14 MED ORDER — LACTATED RINGERS IV SOLN: INTRAVENOUS | Status: DC

## 2020-05-14 MED ORDER — PROPOFOL 10 MG/ML IV BOLUS
INTRAVENOUS | Status: AC
  Filled 2020-05-14: qty 20

## 2020-05-14 MED ORDER — ACETAMINOPHEN 160 MG/5ML PO SOLN: 1000.0000 mg | Freq: Four times a day (QID) | ORAL | Status: AC

## 2020-05-14 MED ORDER — LABETALOL HCL 5 MG/ML IV SOLN
INTRAVENOUS | Status: DC | PRN
  Administered 2020-05-14 (×2): 5 mg via INTRAVENOUS

## 2020-05-14 MED ORDER — FENTANYL CITRATE (PF) 100 MCG/2ML IJ SOLN: 25.0000 ug | INTRAMUSCULAR | Status: DC | PRN

## 2020-05-14 MED ORDER — CHLORHEXIDINE GLUCONATE 0.12 % MT SOLN
OROMUCOSAL | Status: AC
  Administered 2020-05-14: 15 mL via OROMUCOSAL
  Filled 2020-05-14: qty 15

## 2020-05-14 MED ORDER — ONDANSETRON HCL 4 MG/2ML IJ SOLN
INTRAMUSCULAR | Status: AC
  Filled 2020-05-14: qty 2

## 2020-05-14 MED ORDER — ESMOLOL HCL 100 MG/10ML IV SOLN
INTRAVENOUS | Status: AC
  Filled 2020-05-14: qty 10

## 2020-05-14 MED ORDER — PHENYLEPHRINE 40 MCG/ML (10ML) SYRINGE FOR IV PUSH (FOR BLOOD PRESSURE SUPPORT)
PREFILLED_SYRINGE | INTRAVENOUS | Status: AC
  Filled 2020-05-14: qty 10

## 2020-05-14 MED ORDER — ONDANSETRON HCL 4 MG/2ML IJ SOLN
INTRAMUSCULAR | Status: DC | PRN
  Administered 2020-05-14: 4 mg via INTRAVENOUS

## 2020-05-14 MED ORDER — ENOXAPARIN SODIUM 40 MG/0.4ML ~~LOC~~ SOLN
40.0000 mg | Freq: Every day | SUBCUTANEOUS | Status: DC
  Administered 2020-05-15 – 2020-05-27 (×11): 40 mg via SUBCUTANEOUS
  Filled 2020-05-14 (×13): qty 0.4

## 2020-05-14 MED ORDER — ORAL CARE MOUTH RINSE: 15.0000 mL | Freq: Once | OROMUCOSAL | Status: AC

## 2020-05-14 MED ORDER — PROPOFOL 10 MG/ML IV BOLUS
INTRAVENOUS | Status: DC | PRN
  Administered 2020-05-14: 150 mg via INTRAVENOUS

## 2020-05-14 MED ORDER — 0.9 % SODIUM CHLORIDE (POUR BTL) OPTIME
TOPICAL | Status: DC | PRN
  Administered 2020-05-14: 1000 mL

## 2020-05-14 MED ORDER — SODIUM CHLORIDE 0.9 % IV SOLN: INTRAVENOUS | Status: DC | PRN

## 2020-05-14 MED ORDER — FENTANYL CITRATE (PF) 250 MCG/5ML IJ SOLN
INTRAMUSCULAR | Status: AC
  Filled 2020-05-14: qty 5

## 2020-05-14 MED ORDER — FENTANYL CITRATE (PF) 250 MCG/5ML IJ SOLN
INTRAMUSCULAR | Status: DC | PRN
  Administered 2020-05-14: 100 ug via INTRAVENOUS

## 2020-05-14 MED ORDER — BISACODYL 5 MG PO TBEC
10.0000 mg | DELAYED_RELEASE_TABLET | Freq: Every day | ORAL | Status: DC
  Administered 2020-05-15 – 2020-05-25 (×7): 10 mg via ORAL
  Filled 2020-05-14 (×8): qty 2

## 2020-05-14 SURGICAL SUPPLY — 61 items
APPLIER CLIP ROT 10 11.4 M/L (STAPLE)
BIT DRILL 7/64X5 DISP (BIT) IMPLANT
BLADE CLIPPER SURG (BLADE) ×3 IMPLANT
CANISTER SUCT 3000ML PPV (MISCELLANEOUS) ×6 IMPLANT
CATH THORACIC 28FR (CATHETERS) IMPLANT
CATH THORACIC 28FR RT ANG (CATHETERS) IMPLANT
CATH THORACIC 36FR (CATHETERS) IMPLANT
CATH THORACIC 36FR RT ANG (CATHETERS) IMPLANT
CLIP APPLIE ROT 10 11.4 M/L (STAPLE) IMPLANT
CLIP VESOCCLUDE MED 6/CT (CLIP) ×3 IMPLANT
CONN ST 1/4X3/8  BEN (MISCELLANEOUS)
CONN ST 1/4X3/8 BEN (MISCELLANEOUS) IMPLANT
DERMABOND ADVANCED (GAUZE/BANDAGES/DRESSINGS)
DERMABOND ADVANCED .7 DNX12 (GAUZE/BANDAGES/DRESSINGS) IMPLANT
DRSG VAC ATS MED SENSATRAC (GAUZE/BANDAGES/DRESSINGS) ×3 IMPLANT
DRSG VERSA FOAM LRG 10X15 (GAUZE/BANDAGES/DRESSINGS) ×3 IMPLANT
ELECT BLADE 6.5 EXT (BLADE) IMPLANT
ELECT REM PT RETURN 9FT ADLT (ELECTROSURGICAL) ×3
ELECTRODE REM PT RTRN 9FT ADLT (ELECTROSURGICAL) ×1 IMPLANT
FELT TEFLON 1X6 (MISCELLANEOUS) IMPLANT
GAUZE SPONGE 4X4 12PLY STRL (GAUZE/BANDAGES/DRESSINGS) ×3 IMPLANT
GLOVE SURG SIGNA 7.5 PF LTX (GLOVE) ×9 IMPLANT
GOWN STRL REUS W/ TWL LRG LVL3 (GOWN DISPOSABLE) ×2 IMPLANT
GOWN STRL REUS W/ TWL XL LVL3 (GOWN DISPOSABLE) ×1 IMPLANT
GOWN STRL REUS W/TWL LRG LVL3 (GOWN DISPOSABLE) ×6
GOWN STRL REUS W/TWL XL LVL3 (GOWN DISPOSABLE) ×3
INSERT FOGARTY 61MM (MISCELLANEOUS) IMPLANT
KIT BASIN OR (CUSTOM PROCEDURE TRAY) ×3 IMPLANT
KIT SUCTION CATH 14FR (SUCTIONS) ×3 IMPLANT
KIT TURNOVER KIT B (KITS) ×3 IMPLANT
NS IRRIG 1000ML POUR BTL (IV SOLUTION) ×6 IMPLANT
PACK CHEST (CUSTOM PROCEDURE TRAY) ×3 IMPLANT
PAD ARMBOARD 7.5X6 YLW CONV (MISCELLANEOUS) ×6 IMPLANT
PASSER SUT SWANSON 36MM LOOP (INSTRUMENTS) IMPLANT
SEALANT SURG COSEAL 8ML (VASCULAR PRODUCTS) IMPLANT
SPONGE TONSIL TAPE 1 RFD (DISPOSABLE) ×3 IMPLANT
STAPLER VISISTAT 35W (STAPLE) IMPLANT
STOPCOCK 4 WAY LG BORE MALE ST (IV SETS) ×3 IMPLANT
SUT PROLENE 2 0 MH 48 (SUTURE) IMPLANT
SUT PROLENE 2 0 SH DA (SUTURE) IMPLANT
SUT PROLENE 3 0 SH 48 (SUTURE) IMPLANT
SUT PROLENE 4 0 RB 1 (SUTURE) ×3
SUT PROLENE 4 0 SH DA (SUTURE) IMPLANT
SUT PROLENE 4-0 RB1 .5 CRCL 36 (SUTURE) ×1 IMPLANT
SUT SILK  1 MH (SUTURE) ×6
SUT SILK 1 MH (SUTURE) ×2 IMPLANT
SUT SILK 2 0SH CR/8 30 (SUTURE) ×3 IMPLANT
SUT VIC AB 1 CTX 18 (SUTURE) IMPLANT
SUT VIC AB 1 CTX 36 (SUTURE) ×3
SUT VIC AB 1 CTX36XBRD ANBCTR (SUTURE) ×1 IMPLANT
SUT VIC AB 3-0 X1 27 (SUTURE) ×3 IMPLANT
SUT VICRYL 2 TP 1 (SUTURE) ×3 IMPLANT
SWAB CULTURE ESWAB REG 1ML (MISCELLANEOUS) IMPLANT
SYR 10ML LL (SYRINGE) ×3 IMPLANT
SYR 20ML LL LF (SYRINGE) IMPLANT
SYR 50ML LL SCALE MARK (SYRINGE) ×3 IMPLANT
SYSTEM SAHARA CHEST DRAIN ATS (WOUND CARE) ×3 IMPLANT
TOWEL GREEN STERILE (TOWEL DISPOSABLE) ×6 IMPLANT
TRAY FOLEY SLVR 16FR LF STAT (SET/KITS/TRAYS/PACK) ×3 IMPLANT
TUBING EXTENTION W/L.L. (IV SETS) IMPLANT
WATER STERILE IRR 1000ML POUR (IV SOLUTION) ×3 IMPLANT

## 2020-05-14 NOTE — Progress Notes (Signed)
Patients gold necklace and wedding band has been given to Retail banker from [facility]

## 2020-05-14 NOTE — Brief Op Note (Signed)
05/14/2020  3:44 PM  PATIENT:  Fayne Mediate  79 y.o. male  PRE-OPERATIVE DIAGNOSIS:  EMPYEMA  POST-OPERATIVE DIAGNOSIS:  EMPYEMA  PROCEDURE:  Procedure(s): WOUND VAC CHANGE (Right)  SURGEON:  Surgeon(s) and Role:    * [name], MD - Primary  PHYSICIAN ASSISTANT:   ASSISTANTS: none   ANESTHESIA:   general  EBL:  10 mL   BLOOD ADMINISTERED:none  DRAINS: VAC right chest   LOCAL MEDICATIONS USED:  NONE  SPECIMEN:  No Specimen  DISPOSITION OF SPECIMEN:  N/A  COUNTS:  YES  TOURNIQUET:  * No tourniquets in log *  DICTATION: .Other Dictation: Dictation Number -  PLAN OF CARE: Admit to inpatient   PATIENT DISPOSITION:  PACU - hemodynamically stable.   Delay start of Pharmacological VTE agent (>24hrs) due to surgical blood loss or risk of bleeding: no

## 2020-05-14 NOTE — Transfer of Care (Signed)
Immediate Anesthesia Transfer of Care Note  Patient: Mathew Hall  Procedure(s) Performed: WOUND VAC CHANGE (Right )  Patient Location: PACU  Anesthesia Type:General  Level of Consciousness: awake, alert , oriented and patient cooperative  Airway & Oxygen Therapy: Patient Spontanous Breathing  Post-op Assessment: Report given to RN and Post -op Vital signs reviewed and stable  Post vital signs: Reviewed and stable  Last Vitals:  Vitals Value Taken Time  BP 114/96 05/14/20 1550  Temp    Pulse 85 05/14/20 1557  Resp 31 05/14/20 1557  SpO2 98 % 05/14/20 1557  Vitals shown include unvalidated device data.  Last Pain:  Vitals:   05/14/20 1550  TempSrc:   PainSc: (P) 0-No pain         Complications: No complications documented.

## 2020-05-14 NOTE — Anesthesia Procedure Notes (Signed)
Procedure Name: Intubation Date/Time: 05/14/2020 2:57 PM Performed by: Kathryne Hitch, CRNA Pre-anesthesia Checklist: Patient identified, Emergency Drugs available, Suction available and Patient being monitored Patient Re-evaluated:Patient Re-evaluated prior to induction Oxygen Delivery Method: Circle system utilized Preoxygenation: Pre-oxygenation with 100% oxygen Induction Type: IV induction Ventilation: Mask ventilation without difficulty Laryngoscope Size: Miller and 2 Grade View: Grade I Tube type: Oral Tube size: 7.5 mm Number of attempts: 1 Airway Equipment and Method: Stylet and Oral airway Placement Confirmation: ETT inserted through vocal cords under direct vision,  positive ETCO2 and breath sounds checked- equal and bilateral Secured at: 23 cm Tube secured with: Tape Dental Injury: Teeth and Oropharynx as per pre-operative assessment

## 2020-05-14 NOTE — Anesthesia Postprocedure Evaluation (Signed)
Anesthesia Post Note  Patient: ABDULLAHI VALLONE  Procedure(s) Performed: THORACOTOMY (Right ) EMPYEMA DRAINAGE (Right ) RIB RESECTION (Right ) APPLICATION OF WOUND VAC (Right Chest)     Patient location during evaluation: PACU Anesthesia Type: General Level of consciousness: awake and alert Pain management: pain level controlled Vital Signs Assessment: post-procedure vital signs reviewed and stable Respiratory status: spontaneous breathing, nonlabored ventilation, respiratory function stable and patient connected to nasal cannula oxygen Cardiovascular status: blood pressure returned to baseline and stable Postop Assessment: no apparent nausea or vomiting Anesthetic complications: no   No complications documented.  Last Vitals:  Vitals:   05/14/20 0840 05/14/20 1237  BP: 131/82 127/85  Pulse:  90  Resp:  19  Temp: 37.1 C 36.7 C  SpO2:  97%    Last Pain:  Vitals:   05/14/20 1237  TempSrc: Oral  PainSc: 0-No pain                 Vickey Ewbank S

## 2020-05-14 NOTE — Care Management Important Message (Signed)
Important Message  Patient Details  Name: Mathew Hall MRN: [id number] Date of Birth: [birth year]   Medicare Important Message Given:  Yes     [name] 05/14/2020, 4:07 PM

## 2020-05-14 NOTE — Anesthesia Preprocedure Evaluation (Signed)
Anesthesia Evaluation  Patient identified by MRN, date of birth, ID band Patient awake    Reviewed: Allergy & Precautions, NPO status , Patient's Chart, lab work & pertinent test results  Airway Mallampati: II  TM Distance: >3 FB     Dental   Pulmonary asthma , COPD, former smoker,    breath sounds clear to auscultation       Cardiovascular hypertension, + Past MI   Rhythm:Regular Rate:Normal     Neuro/Psych    GI/Hepatic Neg liver ROS, GERD  ,  Endo/Other  negative endocrine ROS  Renal/GU negative Renal ROS     Musculoskeletal   Abdominal   Peds  Hematology   Anesthesia Other Findings   Reproductive/Obstetrics                             Anesthesia Physical Anesthesia Plan  ASA: III  Anesthesia Plan: General   Post-op Pain Management:    Induction: Intravenous  PONV Risk Score and Plan: 2 and Ondansetron, Dexamethasone and Midazolam  Airway Management Planned: Oral ETT  Additional Equipment:   Intra-op Plan:   Post-operative Plan: Possible Post-op intubation/ventilation  Informed Consent: I have reviewed the patients History and Physical, chart, labs and discussed the procedure including the risks, benefits and alternatives for the proposed anesthesia with the patient or authorized representative who has indicated his/her understanding and acceptance.     Dental advisory given  Plan Discussed with: CRNA and Anesthesiologist  Anesthesia Plan Comments:         Anesthesia Quick Evaluation

## 2020-05-14 NOTE — Progress Notes (Signed)
[facility] Progress Note  Patient: Mathew Hall    VOJ:[id number]  DOA: 05/09/2020     Date of Service: the patient was seen and examined on 05/14/2020  Brief hospital course: Past medcal history of recent COVID-19 infection with empyema treated with antibiotics, HTN, GERD, CAD SP PCI. Presents with complaints of worsening back pain. Found to have recurrent empyema and failed outpatient treatment. 11/5 thoracotomy with empyema drainage and rib resection as well as application of wound VAC. Foley catheter placement in OR. Currently plan is continue IV biotics and follow-up on culture.  Wound VAC change in OR today  Assessment and Plan: 1. Empyema necessitans right chest Treated with chest tube and IV antibiotics in 9/21. Followed by oral antibiotics outpatient. Presents with worsening chest pain and found to have persistent subcutaneous mass. CT scan shows evidence of persistent empyema. Pulmonary and cardiothoracic surgery were consulted. Underwent thoracotomy with empyema drainage as well as application of wound VAC. Wound VAC change in OR 05/14/2020. Cultures so far no growth. Continue with IV Unasyn for now.  Transition to p.o. once wound VAC is off. MRSA PCR negative.   Currently continue progressive care unit. Await guidance from culture results, so far negative.  2. History of Metastatic nonsmall cell lung cancer, adenocarcinoma Right Lung cancer in the past  15 yrs ago, seen by Dr Inda Merlin S/pneoadjuvant chemotherapy followed by right middle lobectomy with lymph node dissection. Currently stable.  Monitor.  Cytology was negative in September from the fluid.  3. Essential hypertension. On Norvasc, Beta-blocker were stopped during last admission in September. Patient still appears to be back on beta-blocker.   Currently with bradycardia and therefore I will discontinue the beta-blocker. Patient reports that he lost his 20 pounds in the hospital and because of  that his blood pressure is significantly better.  4. History of CAD. Chest pain-free no acute issues,beta-blockers has been stopped. Resume aspirin. Statin.  5. Severe protein calorie malnutrition. Dietary consultation.  6.  Anemia secondary to chronic illness No hemoglobin stable.  Monitor.  Continue nutritional supplementation.  Body mass index is 22.09 kg/m.  Nutrition Problem: Severe Malnutrition Etiology: acute illness (recurrent rt empyema) Interventions: Interventions: Ensure Enlive (each supplement provides 350kcal and 20 grams of protein), MVI      Diet: Regular diet DVT Prophylaxis:   enoxaparin (LOVENOX) injection 40 mg Start: 05/14/20 2100 SCD's Start: 05/14/20 1543 SCD's Start: 05/11/20 1146  Advance goals of care discussion: Full code  Family Communication: no family was present at bedside, at the time of interview.   Disposition:  Status is: Inpatient  Remains inpatient appropriate because:IV treatments appropriate due to intensity of illness or inability to take PO  Dispo: The patient is from: Home              Anticipated d/c is to: SNF              Anticipated d/c date is: > 3 days              Patient currently is not medically stable to d/c.  Subjective: No nausea no vomiting or no fever no chills or no chest pain.  Physical Exam:  General: Appear in mild distress, no Rash; Oral Mucosa Clear, moist. no Abnormal Neck Mass Or lumps, Conjunctiva normal  Cardiovascular: S1 and S2 Present, no Murmur, Respiratory: good respiratory effort, Bilateral Air entry present and CTA, no Crackles, no wheezes Abdomen: Bowel Sound present, Soft and no tenderness Extremities: no Pedal edema Neurology: alert  and oriented to time, place, and person affect appropriate. no new focal deficit Gait not checked due to patient safety concerns  Vitals:   05/14/20 1313 05/14/20 1550 05/14/20 1605 05/14/20 1620  BP:  (!) 114/96 134/79 136/76  Pulse:  91 91 79  Resp:   18 20 20   Temp:  (!) 97.2 F (36.2 C)  (!) 97.2 F (36.2 C)  TempSrc:      SpO2:  98% 98% 99%  Weight: 65.9 kg     Height: 5\' 8"  (1.727 m)       Intake/Output Summary (Last 24 hours) at 05/14/2020 1704 Last data filed at 05/14/2020 1600 Gross per 24 hour  Intake 340 ml  Output 545 ml  Net -205 ml   Filed Weights   05/10/20 0731 05/12/20 0620 05/14/20 1313  Weight: 66.5 kg 65.9 kg 65.9 kg    Data Reviewed: I have personally reviewed and interpreted daily labs, tele strips, imagings as discussed above. I reviewed all nursing notes, pharmacy notes, vitals, pertinent old records I have discussed plan of care as described above with RN and patient/family.  CBC: Recent Labs  Lab 05/10/20 0131 05/10/20 1635 05/11/20 0401 05/12/20 0107 05/13/20 0829  WBC 6.1 5.7 4.9 7.6 6.1  NEUTROABS  --   --  2.6  --   --   HGB 9.3* 9.5* 9.9* 9.0* 9.4*  HCT 29.8* 30.4* 31.1* 28.8* 29.7*  MCV 83.5 83.3 83.8 83.7 84.6  PLT 290 303 299 280 387   Basic Metabolic Panel: Recent Labs  Lab 05/09/20 1104 05/10/20 1635 05/11/20 0401 05/12/20 0107 05/13/20 0829  NA 137 136 136 136 136  K 4.2 4.0 4.2 4.4 3.9  CL 100 100 103 103 102  CO2 26 26 24 26 25   GLUCOSE 109* 104* 111* 157* 98  BUN 11 19 15 15 10   CREATININE 1.00 0.97 0.90 0.73 0.83  CALCIUM 9.5 9.3 9.3 9.1 9.0    Studies: No results found.  Scheduled Meds: . acetaminophen  1,000 mg Oral Q6H   Or  . acetaminophen (TYLENOL) oral liquid 160 mg/5 mL  1,000 mg Oral Q6H  . amLODipine  5 mg Oral Daily  . aspirin  325 mg Oral Daily  . bisacodyl  10 mg Oral Daily  . bupivacaine liposome  20 mL Infiltration Once  . Chlorhexidine Gluconate Cloth  6 each Topical Daily  . docusate sodium  100 mg Oral BID  . enoxaparin (LOVENOX) injection  40 mg Subcutaneous Daily  . feeding supplement  237 mL Oral BID BM  . [START ON 05/15/2020] metoprolol tartrate  25 mg Oral BID  . multivitamin with minerals  1 tablet Oral Daily  . pantoprazole  40  mg Oral QAC breakfast  . [START ON 05/15/2020] polyethylene glycol  17 g Oral Daily  . potassium chloride SA  20 mEq Oral Daily  . senna-docusate  1 tablet Oral QHS  . Vitamin D (Ergocalciferol)  50,000 Units Oral Weekly   Continuous Infusions: . 0.45 % NaCl with KCl 20 mEq / L    . ampicillin-sulbactam (UNASYN) IV 1.5 g (05/14/20 1700)   PRN Meds: acetaminophen **OR** acetaminophen, HYDROcodone-acetaminophen, lactulose, ondansetron (ZOFRAN) IV, oxybutynin, oxyCODONE, oxyCODONE, traMADol  Time spent: 35 minutes  Author: Berle Mull, MD [facility] 05/14/2020 5:04 PM  To reach On-call, see care teams to locate the attending and reach out via [email or web address]. Between 7PM-7AM, please contact night-coverage If you still have difficulty reaching the attending provider, please page the [facility] (  Director on Call) for [facility] on [place] for assistance.

## 2020-05-14 NOTE — Progress Notes (Signed)
Patient arrived to short stay wearing a gold wedding band and gold chain with gold cross on it.  Called 2C, RN to come get patient's valuables.

## 2020-05-14 NOTE — Anesthesia Postprocedure Evaluation (Signed)
Anesthesia Post Note  Patient: Mathew Hall  Procedure(s) Performed: WOUND VAC CHANGE (Right )     Patient location during evaluation: PACU Anesthesia Type: General Level of consciousness: awake Pain management: pain level controlled Vital Signs Assessment: post-procedure vital signs reviewed and stable Respiratory status: spontaneous breathing Cardiovascular status: stable Postop Assessment: no apparent nausea or vomiting Anesthetic complications: no   No complications documented.  Last Vitals:  Vitals:   05/14/20 1605 05/14/20 1620  BP: 134/79 136/76  Pulse: 91 79  Resp: 20 20  Temp:  (!) 36.2 C  SpO2: 98% 99%    Last Pain:  Vitals:   05/14/20 1756  TempSrc:   PainSc: 2                  Coolidge Gossard

## 2020-05-14 NOTE — Progress Notes (Signed)
3 Days Post-Op Procedure(s) (LRB): THORACOTOMY (Right) EMPYEMA DRAINAGE (Right) RIB RESECTION (Right) APPLICATION OF WOUND VAC (Right) Subjective: C/o constipation yesterday Feels better today  Objective: Vital signs in last 24 hours: Temp:  [97.5 F (36.4 C)-98.1 F (36.7 C)] 97.9 F (36.6 C) (11/08 0337) Pulse Rate:  [73-107] 85 (11/08 0337) Cardiac Rhythm: Normal sinus rhythm;Sinus tachycardia (11/07 2000) Resp:  [14-20] 20 (11/08 0337) BP: (122-133)/(70-82) 133/71 (11/08 0337) SpO2:  [96 %-99 %] 96 % (11/08 0337)  Hemodynamic parameters for last 24 hours:    Intake/Output from previous day: 11/07 0701 - 11/08 0700 In: 720 [P.O.:720] Out: 1225 [Urine:1200; Drains:25] Intake/Output this shift: No intake/output data recorded.  General appearance: alert, cooperative and no distress Neurologic: intact Heart: regular rate and rhythm Lungs: diminished breath sounds right base Abdomen: normal findings: soft, non-tender  Lab Results: Recent Labs    05/12/20 0107 05/13/20 0829  WBC 7.6 6.1  HGB 9.0* 9.4*  HCT 28.8* 29.7*  PLT 280 285   BMET:  Recent Labs    05/12/20 0107 05/13/20 0829  NA 136 136  K 4.4 3.9  CL 103 102  CO2 26 25  GLUCOSE 157* 98  BUN 15 10  CREATININE 0.73 0.83  CALCIUM 9.1 9.0    PT/INR: No results for input(s): LABPROT, INR in the last 72 hours. ABG    Component Value Date/Time   PHART 7.467 (H) 05/10/2020 1715   HCO3 24.0 05/10/2020 1715   TCO2 25 03/06/2009 0437   ACIDBASEDEF 0.5 03/01/2009 0922   O2SAT 97.4 05/10/2020 1715   CBG (last 3)  Recent Labs    05/11/20 1618 05/12/20 1209  GLUCAP 175* 115*    Assessment/Plan: S/P Procedure(s) (LRB): THORACOTOMY (Right) EMPYEMA DRAINAGE (Right) RIB RESECTION (Right) APPLICATION OF WOUND VAC (Right) -Empyema necessitans Cultures NGTD Grew strep intermedius last admission Continue Unasyn for now VAC change under anesthesia this AM   LOS: 5 days    [name] 05/14/2020

## 2020-05-15 ENCOUNTER — Encounter (HOSPITAL_COMMUNITY): Payer: Self-pay | Admitting: Thoracic Surgery (Cardiothoracic Vascular Surgery)

## 2020-05-15 ENCOUNTER — Inpatient Hospital Stay (HOSPITAL_COMMUNITY): Payer: Medicare Other

## 2020-05-15 ENCOUNTER — Institutional Professional Consult (permissible substitution): Payer: Medicare Other | Admitting: Pulmonary Disease

## 2020-05-15 DIAGNOSIS — J860 Pyothorax with fistula: Secondary | ICD-10-CM | POA: Diagnosis not present

## 2020-05-15 LAB — CBC
HCT: 31.7 % — ABNORMAL LOW (ref 39.0–52.0)
Hemoglobin: 9.7 g/dL — ABNORMAL LOW (ref 13.0–17.0)
MCH: 26.3 pg (ref 26.0–34.0)
MCHC: 30.6 g/dL (ref 30.0–36.0)
MCV: 85.9 fL (ref 80.0–100.0)
Platelets: 288 10*3/uL (ref 150–400)
RBC: 3.69 MIL/uL — ABNORMAL LOW (ref 4.22–5.81)
RDW: 15.4 % (ref 11.5–15.5)
WBC: 5 10*3/uL (ref 4.0–10.5)
nRBC: 0 % (ref 0.0–0.2)

## 2020-05-15 LAB — BASIC METABOLIC PANEL
Anion gap: 9 (ref 5–15)
BUN: 20 mg/dL (ref 8–23)
CO2: 26 mmol/L (ref 22–32)
Calcium: 9.2 mg/dL (ref 8.9–10.3)
Chloride: 100 mmol/L (ref 98–111)
Creatinine, Ser: 0.84 mg/dL (ref 0.61–1.24)
GFR, Estimated: >60 mL/min (ref >60)
Glucose, Bld: 233 mg/dL — ABNORMAL HIGH (ref 70–99)
Potassium: 4.6 mmol/L (ref 3.5–5.1)
Sodium: 135 mmol/L (ref 135–145)

## 2020-05-15 LAB — CULTURE, BLOOD (ROUTINE X 2): Culture: NO GROWTH

## 2020-05-15 NOTE — Progress Notes (Addendum)
Pt "adjusting himself in bed" when he popped off the suction portion of the wound vac. Foam and Tegaderm portion still intact. New parts ordered for RN to replace just the round suction part. Wound vac turned off for approx 25 mins while waiting for supplies to deliver to floor

## 2020-05-15 NOTE — TOC Progression Note (Signed)
Transition of Care [facility]) - Progression Note    Patient Details  Name: KEATS KINGRY MRN: [id number] Date of Birth: 06-27-1941  [facility] of [facility] CM/SW Contact  Zenon Mayo, RN Phone Number: 05/15/2020, 1:27 PM  Clinical Narrative:    Per MD patient will go home with wound vac,  NCM made referral to [facility] with [employer] for wound vac.     Expected Discharge Plan: Home/Self Care Barriers to Discharge: Continued Medical Work up  Expected Discharge Plan and Services Expected Discharge Plan: Home/Self Care   Discharge Planning Services: CM Consult   Living arrangements for the past 2 months: Single Family Home                                       Social Determinants of Health (SDOH) Interventions    Readmission Risk Interventions No flowsheet data found.

## 2020-05-15 NOTE — Progress Notes (Signed)
[facility] Progress Note  Patient: Mathew Hall    FFM:[id number]  DOA: 05/09/2020     Date of Service: the patient was seen and examined on 05/15/2020  Brief hospital course: CAD SP PCI. Presents with complaints of worsening back pain. Found to have recurrent empyema and failed outpatient treatment. 11/5 thoracotomy with empyema drainage and rib resection as well as application of wound VAC. Foley catheter placement in OR. Currently plan is continue with IV antibiotics, wound VAC change in OR on Thursday.  Assessment and Plan: 1. Empyema necessitans right chest Treated with chest tube and IV antibiotics in9/21. Followed by oral antibiotics outpatient. Presents withworsening chest pain and found to have persistent subcutaneous mass. CT scan shows evidence of persistent empyema. Pulmonary and cardiothoracic surgery were consulted. 11/05 Underwent thoracotomy with empyema drainage as well as application of wound VAC. 11/08 Wound VAC change in OR. MRSA PCR negative.   Cultures so far no growth. Continue with IV Unasyn for now.  Transition to p.o. once wound VAC is off. Currently continue progressive care unit.  2. History of Metastatic nonsmall cell lung cancer, adenocarcinoma Right Lung cancer in the past  15 yrs ago, seen by Dr Inda Merlin S/pneoadjuvant chemotherapy followed by right middle lobectomy with lymph node dissection. Currently stable.  Monitor.  Cytology was negative in September from the fluid.  3. Essential hypertension. On Norvasc, Beta-blocker were stopped during last admission in September. Patient still appears to be back on beta-blocker.   Currently with bradycardia and therefore I will discontinue the beta-blocker. Patient reports that he lost 20 pounds in the hospital and because of that his blood pressure is significantly better.  4. History of CAD. Chest pain-free no acute issues,beta-blockers has been stopped. Resume  aspirin. Statin.  5. Severe protein calorie malnutrition. Dietary consultation.  Appreciate assistance.  6.  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  Monitor.  Continue nutritional supplementation.  Body mass index is 22.09 kg/m.  Nutrition Problem: Severe Malnutrition Etiology: acute illness (recurrent rt empyema) Interventions: Interventions: Ensure Enlive (each supplement provides 350kcal and 20 grams of protein), MVI  Diet: Regular diet DVT Prophylaxis:   enoxaparin (LOVENOX) injection 40 mg Start: 05/14/20 2100 SCD's Start: 05/14/20 1543 SCD's Start: 05/11/20 1146  Advance goals of care discussion: Full code  Family Communication: no family was present at bedside, at the time of interview.  Patient mentioned that he remains in touch with his wife.  Disposition:  Status is: Inpatient  Remains inpatient appropriate because:Ongoing diagnostic testing needed not appropriate for outpatient work up  Dispo: The patient is from: Home              Anticipated d/c is to: Home              Anticipated d/c date is: 3 days              Patient currently is not medically stable to d/c.  Subjective: No nausea no vomiting.  Pain still present but controlled.  No fever no chills.  Reports constipation.  Passing gas.  Tolerating oral diet.  Physical Exam:  General: Appear in mild distress, no Rash; Oral Mucosa Clear, moist. no Abnormal Neck Mass Or lumps, Conjunctiva normal  Cardiovascular: S1 and S2 Present, no Murmur, Respiratory: good respiratory effort, Bilateral Air entry present and CTA, no Crackles, no wheezes Abdomen: Bowel Sound present, Soft and no tenderness Extremities: no Pedal edema Neurology: alert and oriented to time, place, and person affect appropriate. no new  focal deficit Gait not checked due to patient safety concerns  Vitals:   05/15/20 0106 05/15/20 0353 05/15/20 0736 05/15/20 1200  BP: 107/69 103/66 126/80 98/83  Pulse: 75 60 71 90   Resp: 20 16 19 20   Temp: 98 F (36.7 C) (!) 97.5 F (36.4 C) 97.9 F (36.6 C)   TempSrc: Oral Oral Oral   SpO2: 95% 94% 93% 98%  Weight:      Height:        Intake/Output Summary (Last 24 hours) at 05/15/2020 1442 Last data filed at 05/15/2020 0900 Gross per 24 hour  Intake 2273.59 ml  Output 1220 ml  Net 1053.59 ml   Filed Weights   05/10/20 0731 05/12/20 0620 05/14/20 1313  Weight: 66.5 kg 65.9 kg 65.9 kg    Data Reviewed: I have personally reviewed and interpreted daily labs, tele strips, imagings as discussed above. I reviewed all nursing notes, pharmacy notes, vitals, pertinent old records I have discussed plan of care as described above with RN and patient/family.  CBC: Recent Labs  Lab 05/10/20 1635 05/11/20 0401 05/12/20 0107 05/13/20 0829 05/15/20 0029  WBC 5.7 4.9 7.6 6.1 5.0  NEUTROABS  --  2.6  --   --   --   HGB 9.5* 9.9* 9.0* 9.4* 9.7*  HCT 30.4* 31.1* 28.8* 29.7* 31.7*  MCV 83.3 83.8 83.7 84.6 85.9  PLT 303 299 280 285 161   Basic Metabolic Panel: Recent Labs  Lab 05/10/20 1635 05/11/20 0401 05/12/20 0107 05/13/20 0829 05/15/20 0029  NA 136 136 136 136 135  K 4.0 4.2 4.4 3.9 4.6  CL 100 103 103 102 100  CO2 26 24 26 25 26   GLUCOSE 104* 111* 157* 98 233*  BUN 19 15 15 10 20   CREATININE 0.97 0.90 0.73 0.83 0.84  CALCIUM 9.3 9.3 9.1 9.0 9.2    Studies: DG Chest Port 1 View  Result Date: 05/15/2020 CLINICAL DATA:  History of empyema.  Short of breath EXAM: PORTABLE CHEST 1 VIEW COMPARISON:  05/12/2020 FINDINGS: Postsurgical changes right lung base. Mild improvement in right lower lobe airspace disease. No effusion. Left lung clear.  Negative for heart failure or pleural effusion IMPRESSION: Interval improvement in right lower lobe aeration with decreased airspace disease. No pleural effusion. Electronically Signed   By: Franchot Gallo M.D.   On: 05/15/2020 07:57    Scheduled Meds: . acetaminophen  1,000 mg Oral Q6H   Or  . acetaminophen  (TYLENOL) oral liquid 160 mg/5 mL  1,000 mg Oral Q6H  . amLODipine  5 mg Oral Daily  . aspirin  325 mg Oral Daily  . bisacodyl  10 mg Oral Daily  . Chlorhexidine Gluconate Cloth  6 each Topical Daily  . docusate sodium  100 mg Oral BID  . enoxaparin (LOVENOX) injection  40 mg Subcutaneous Daily  . feeding supplement  237 mL Oral BID BM  . metoprolol tartrate  25 mg Oral BID  . multivitamin with minerals  1 tablet Oral Daily  . pantoprazole  40 mg Oral QAC breakfast  . polyethylene glycol  17 g Oral Daily  . potassium chloride SA  20 mEq Oral Daily  . senna-docusate  1 tablet Oral QHS  . Vitamin D (Ergocalciferol)  50,000 Units Oral Weekly   Continuous Infusions: . ampicillin-sulbactam (UNASYN) IV 1.5 g (05/15/20 1133)   PRN Meds: lactulose, ondansetron (ZOFRAN) IV, oxybutynin, oxyCODONE, traMADol  Time spent: 35 minutes  Author: Berle Mull, MD [facility] 05/15/2020 2:42  PM  To reach On-call, see care teams to locate the attending and reach out via [email or web address]. Between 7PM-7AM, please contact night-coverage If you still have difficulty reaching the attending provider, please page the [facility] (Director on Call) for [facility] on amion for assistance.

## 2020-05-15 NOTE — Op Note (Signed)
NAME: JOHNSON, ARIZOLA MEDICAL RECORD [id number]:[id number] ACCOUNT [id number] DATE OF BIRTH:1940-10-26 FACILITY: [facility] LOCATION: [facility] PHYSICIAN:Jhordan Kinter Chaya Jan, MD  OPERATIVE REPORT  DATE OF PROCEDURE:  05/14/2020  PREOPERATIVE DIAGNOSES:  Empyema necessitans status post incision and drainage.  POSTOPERATIVE DIAGNOSES:  Empyema necessitans status post incision and drainage.  PROCEDURE:  VAC change under general anesthesia.  SURGEON:  Dr. Modesto Charon.    ASSISTANT:  None.  FINDINGS:  Some mild purulence on the internal sponges, chest wall granulating over 90% of surface area, wound 9 cm in length, 4 cm in width, chest wall 3 cm in depth, 7 cm cavity in pleural space resulting in total depth of 10 cm.  CLINICAL NOTE:  The patient is a 79 year old man recently treated for empyema.  He presented back with an empyema necessitans with communication into the right chest.  He was taken to the operating room on 05/11/2020 where he underwent incision and  drainage and VAC placement.  He now needs a VAC dressing change.  We plan to do this in the operating room under general anesthesia.  The indications, risks, benefits, and alternatives were discussed in detail with the patient.  He accepted the risks and  agreed to proceed.  OPERATIVE NOTE:  The patient was brought to the operating room on 05/14/2020.  He received intravenous Unasyn per schedule.  He was anesthetized and intubated sequential compression devices were in place on the calves.  He was placed in a left lateral  decubitus position.  The superficial VAC sponge was removed.  The right chest was prepped and draped in the usual sterile fashion.   A timeout was performed.  The remaining VAC sponges were removed including the deep sponges.  The chest wall and resected rib areas were granulating.  There was some mild purulence on the internal VAC sponges.  The empyema cavity was copiously irrigated  with warm saline as was the chest  wall wound.  A white VAC sponge then was placed back into the cavity, which was approximately 7 cm in diameter and then the chest wall was packed with the normal VAC sponge, dressing was applied and 75 mmHg suction was  applied to the VAC, there was a good seal.  The patient then was extubated in the operating room and taken to the [place] [facility] in good condition.  HN/NUANCE  D:05/14/2020 T:05/15/2020 JOB:[id number]

## 2020-05-15 NOTE — Progress Notes (Signed)
Nutrition Follow-up  DOCUMENTATION CODES:   Severe malnutrition in context of acute illness/injury  INTERVENTION:   - Ensure Enlive po BID, each supplement provides 350 kcal and 20 grams of protein  - MVI with minerals daily  - Liberalize diet to Regular, verbal with readback order placed per MD  NUTRITION DIAGNOSIS:   Severe Malnutrition related to acute illness (recurrent rt empyema) as evidenced by percent weight loss, moderate fat depletion, severe fat depletion, severe muscle depletion, moderate muscle depletion.  Ongoing  GOAL:   Patient will meet greater than or equal to 90% of their needs  Progressing  MONITOR:   PO intake, Supplement acceptance, Labs, Weight trends, Skin, I & O's  REASON FOR ASSESSMENT:   Malnutrition Screening Tool    ASSESSMENT:   Mathew Hall is a 79 y.o. male with medical history significant of right-sided empyema recently September 16, remote lung CA on right side, recent COVID infection in September 2021, hypertension, GERD, presented with recurrent right-sided empyema.  Pt admitted with recurrent right sided empyema.  11/05 - s/p thoracotomy, empyema drainage, rib resection, application of wound VAC 11/08 - would VAC change in OR  Noted plan for VAC change Thursday or Friday in OR.  Spoke with pt at bedside. Pt reports appetite is good but that he is having issues finding foods that he enjoys and ordering foods that aren't "tough." Pt reports that the chicken is very "tough." Discussed diet liberalization with MD who agreed. Verbal with readback order placed for Regular diet.  Meal Completion: 25-90%  Medications reviewed and include: dulcolax, colace, Ensure Enlive BID, MVI with minerals daily, protonix, miralax, klor-con 20 mEq daily, senna, vitamin D weekly, IV abx  Labs reviewed: hemoglobin 9.7  UOP: 535 ml x 24 hours I/O's: -1.9 L since admit  Diet Order:   Diet Order            Diet regular Room service appropriate?  Yes; Fluid consistency: Thin  Diet effective now                 EDUCATION NEEDS:   Education needs have been addressed  Skin:  Skin Assessment: Skin Integrity Issues: Incisions: chest x 2  Last BM:  05/10/20  Height:   Ht Readings from Last 1 Encounters:  05/14/20 5\' 8"  (1.727 m)    Weight:   Wt Readings from Last 1 Encounters:  05/14/20 65.9 kg    Ideal Body Weight:  70 kg  BMI:  Body mass index is 22.09 kg/m.  Estimated Nutritional Needs:   Kcal:  2100-2300  Protein:  115-130 grams  Fluid:  > 2 L    Gaynell Face, MS, RD, LDN Inpatient Clinical Dietitian Please see AMiON for contact information.

## 2020-05-15 NOTE — Progress Notes (Addendum)
      [address]       [place] [address]             [phone]      2 Days Post-Op Procedure(s) (LRB): WOUND VAC CHANGE (Right)   Subjective:  Awake and alert, says he had some pain early post-op last evening but is comfortable now. Tolerating PO's  Objective: Vital signs in last 24 hours: Temp:  [97.2 F (36.2 C)-98.8 F (37.1 C)] 97.9 F (36.6 C) (11/09 0736) Pulse Rate:  [60-91] 71 (11/09 0736) Cardiac Rhythm: Normal sinus rhythm (11/09 0353) Resp:  [15-20] 19 (11/09 0736) BP: (103-136)/(65-96) 126/80 (11/09 0736) SpO2:  [93 %-99 %] 93 % (11/09 0736) Weight:  [65.9 kg] 65.9 kg (11/08 1313)  Intake/Output from previous day: 11/08 0701 - 11/09 0700 In: 2153.6 [I.V.:477.2; IV Piggyback:1676.4] Out: 545 [Urine:535; Blood:10]  General appearance: alert, cooperative and no distress Heart: regular rate and rhythm Lungs: clear to auscultation bilaterally Wound: wound vac in place and functioning appropriately  Lab Results: Recent Labs    05/13/20 0829 05/15/20 0029  WBC 6.1 5.0  HGB 9.4* 9.7*  HCT 29.7* 31.7*  PLT 285 288   BMET:  Recent Labs    05/13/20 0829 05/15/20 0029  NA 136 135  K 3.9 4.6  CL 102 100  CO2 25 26  GLUCOSE 98 233*  BUN 10 20  CREATININE 0.83 0.84  CALCIUM 9.0 9.2    PT/INR:  No results for input(s): LABPROT, INR in the last 72 hours. ABG    Component Value Date/Time   PHART 7.467 (H) 05/10/2020 1715   HCO3 24.0 05/10/2020 1715   TCO2 25 03/06/2009 0437   ACIDBASEDEF 0.5 03/01/2009 0922   O2SAT 97.4 05/10/2020 1715   CBG (last 3)  Recent Labs    05/12/20 1209  GLUCAP 115*    Assessment/Plan: S/P Procedure(s) (LRB): WOUND VAC CHANGE (Right)  1. S/P Right Thoracotomy with rib resection, drainage of empyema.Marland Kitchen wound vac in place with appropriate function. D/C Foley catheter, D/C IVF.  2. ID-afebrile, no leukocytosis.. OR culture shows no growth at 3 days, on IV ABX 3. Dispo- patient stable,  continue ABX, vac change Thursday or Friday in OR.  LOS: 6 days   Antony Odea, PA-C 05/15/2020 Patient seen and examined, agree with above Will dc Foley as he is ambulatory  Rolin Schult C. Roxan Hockey, MD [facility] [phone]

## 2020-05-15 NOTE — Evaluation (Signed)
Physical Therapy Evaluation Patient Details Name: Mathew Hall MRN: [id number] DOB: 04/10/1941 Today's Date: 05/15/2020   History of Present Illness  Past medcal history of recent COVID-19 infection with empyema treated with antibiotics, HTN, GERD, CAD SP PCI. Presents with complaints of worsening back pain. Found to have recurrent empyema and failed outpatient treatment.11/5 thoracotomy with empyema drainage and rib resection as well as application of wound VAC.    Clinical Impression  Pt admitted with above diagnosis. Pt was able to ambulate with min guard assist and cues with RW.  Pt safe with use of RW and has one he can use if needed at home. No LOB.  Pt should progress well.  Pt currently with functional limitations due to the deficits listed below (see PT Problem List). Pt will benefit from skilled PT to increase their independence and safety with mobility to allow discharge to the venue listed below.      Follow Up Recommendations No PT follow up;Supervision/Assistance - 24 hour    Equipment Recommendations  None recommended by PT    Recommendations for Other Services       Precautions / Restrictions Precautions Precautions: Fall Precaution Comments: wound VAC Restrictions Weight Bearing Restrictions: No      Mobility  Bed Mobility Overal bed mobility: Independent                  Transfers Overall transfer level: Modified independent Equipment used: Rolling walker (2 wheeled)                Ambulation/Gait Ambulation/Gait assistance: Min guard;Supervision Gait Distance (Feet): 600 Feet Assistive device: Rolling walker (2 wheeled) Gait Pattern/deviations: Step-through pattern;Decreased stride length;Drifts right/left   Gait velocity interpretation: <1.8 ft/sec, indicate of risk for recurrent falls General Gait Details: Pt was able to ambulate on unit with occasional cues for steering RW.  Overall progressing well with gait.   Stairs             Wheelchair Mobility    Modified Rankin (Stroke Patients Only)       Balance                                             Pertinent Vitals/Pain Pain Assessment: No/denies pain    Home Living Family/patient expects to be discharged to:: Private residence Living Arrangements: Spouse/significant other Available Help at Discharge: Family;Available 24 hours/day Type of Home: House Home Access: Stairs to enter Entrance Stairs-Rails: Right;Left;Can reach both Entrance Stairs-Number of Steps: 4 Home Layout: One level Home Equipment: Grab bars - tub/shower;Cane - single point;Walker - 4 wheels;Shower seat - built in      Prior Function Level of Independence: Independent         Comments: almost never uses cane or walker for knee pain; still push mows his yard and ditches     Hand Dominance   Dominant Hand: Right    Extremity/Trunk Assessment   Upper Extremity Assessment Upper Extremity Assessment: Defer to OT evaluation    Lower Extremity Assessment Lower Extremity Assessment: Generalized weakness    Cervical / Trunk Assessment Cervical / Trunk Assessment: Normal  Communication   Communication: HOH  Cognition Arousal/Alertness: Awake/alert Behavior During Therapy: WFL for tasks assessed/performed Overall Cognitive Status: Within Functional Limits for tasks assessed  General Comments      Exercises     Assessment/Plan    PT Assessment Patient needs continued PT services  PT Problem List Decreased activity tolerance;Decreased balance;Decreased mobility;Decreased knowledge of use of DME;Decreased safety awareness;Decreased knowledge of precautions;Cardiopulmonary status limiting activity       PT Treatment Interventions DME instruction;Gait training;Functional mobility training;Therapeutic exercise;Therapeutic activities;Balance training;Patient/family education;Stair training     PT Goals (Current goals can be found in the Care Plan section)  Acute Rehab PT Goals Patient Stated Goal: to go home PT Goal Formulation: With patient Time For Goal Achievement: 05/29/20 Potential to Achieve Goals: Good    Frequency Min 3X/week   Barriers to discharge        Co-evaluation               AM-PAC PT "6 Clicks" Mobility  Outcome Measure Help needed turning from your back to your side while in a flat bed without using bedrails?: None Help needed moving from lying on your back to sitting on the side of a flat bed without using bedrails?: None Help needed moving to and from a bed to a chair (including a wheelchair)?: None Help needed standing up from a chair using your arms (e.g., wheelchair or bedside chair)?: A Little Help needed to walk in hospital room?: A Little Help needed climbing 3-5 steps with a railing? : A Little 6 Click Score: 21    End of Session Equipment Utilized During Treatment: Gait belt Activity Tolerance: Patient tolerated treatment well Patient left: in chair;with call bell/phone within reach;with chair alarm set Nurse Communication: Mobility status PT Visit Diagnosis: Muscle weakness (generalized) (M62.81)    Time: 6568-1275 PT Time Calculation (min) (ACUTE ONLY): 25 min   Charges:   PT Evaluation $PT Eval Moderate Complexity: 1 Mod PT Treatments $Gait Training: 8-22 mins        Sivan Quast W,PT Acute Rehabilitation Services Pager:  [phone]  Office:  [phone]    Denice Paradise 05/15/2020, 11:24 AM

## 2020-05-16 DIAGNOSIS — E43 Unspecified severe protein-calorie malnutrition: Secondary | ICD-10-CM | POA: Diagnosis not present

## 2020-05-16 DIAGNOSIS — J869 Pyothorax without fistula: Secondary | ICD-10-CM | POA: Diagnosis not present

## 2020-05-16 LAB — COMPREHENSIVE METABOLIC PANEL
ALT: 15 U/L (ref 0–44)
AST: 19 U/L (ref 15–41)
Albumin: 2.7 g/dL — ABNORMAL LOW (ref 3.5–5.0)
Alkaline Phosphatase: 55 U/L (ref 38–126)
Anion gap: 9 (ref 5–15)
BUN: 19 mg/dL (ref 8–23)
CO2: 25 mmol/L (ref 22–32)
Calcium: 9.2 mg/dL (ref 8.9–10.3)
Chloride: 102 mmol/L (ref 98–111)
Creatinine, Ser: 0.85 mg/dL (ref 0.61–1.24)
GFR, Estimated: >60 mL/min (ref >60)
Glucose, Bld: 113 mg/dL — ABNORMAL HIGH (ref 70–99)
Potassium: 4.3 mmol/L (ref 3.5–5.1)
Sodium: 136 mmol/L (ref 135–145)
Total Bilirubin: 0.7 mg/dL (ref 0.3–1.2)
Total Protein: 6.7 g/dL (ref 6.5–8.1)

## 2020-05-16 LAB — AEROBIC/ANAEROBIC CULTURE W GRAM STAIN (SURGICAL/DEEP WOUND)
Culture: NO GROWTH
Culture: NO GROWTH
Culture: NO GROWTH
Culture: NO GROWTH

## 2020-05-16 LAB — CBC
HCT: 30.7 % — ABNORMAL LOW (ref 39.0–52.0)
Hemoglobin: 9.6 g/dL — ABNORMAL LOW (ref 13.0–17.0)
MCH: 26.5 pg (ref 26.0–34.0)
MCHC: 31.3 g/dL (ref 30.0–36.0)
MCV: 84.8 fL (ref 80.0–100.0)
Platelets: 322 10*3/uL (ref 150–400)
RBC: 3.62 MIL/uL — ABNORMAL LOW (ref 4.22–5.81)
RDW: 15.9 % — ABNORMAL HIGH (ref 11.5–15.5)
WBC: 7.3 10*3/uL (ref 4.0–10.5)
nRBC: 0 % (ref 0.0–0.2)

## 2020-05-16 NOTE — Progress Notes (Signed)
[facility]  PROGRESS NOTE  Mathew Hall MCN:[id number] DOB: 04-30-1941 DOA: 05/09/2020 PCP: [name], MD   Brief HPI:   79 year old male with history of recent COVID-19 infection with empyema treated with antibiotics, hypertension, GERD, CAD s/p PCI presents with complaints of worsening back pain.  He was found to have recurrent empyema and failed outpatient treatment.  Underwent thoracotomy on 05/11/2020 with empyema drainage and rib resection as well as application of wound VAC.    Subjective   Patient seen and examined, pain is well controlled.  Denies shortness of breath.   Assessment/Plan:     1. Empyema necessitates right chest-s/p thoracotomy and drainage of empyema with rib resection as well as application of wound VAC on 05/11/2020.  Patient was initially treated with chest tube and IV antibiotics in September followed by oral antibiotics outpatient.  He presented with worsening chest pain and was found to have persistent subcutaneous mass.  CT showed evidence of persistent empyema.  Patient underwent above procedure.  Continue IV Unasyn.  We will switch to p.o. Augmentin once wound VAC is off.  MRSA PCR is negative.  Wound culture is currently pending. 2. 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  He was seen by Dr. Worthy Flank 15 years ago.  S/p neoadjuvant chemotherapy followed by right middle lobectomy and lymph node dissection.  Cytology was negative from pleural fluid in September. 3. Essential hypertension-blood pressures controlled, continue Norvasc.  Beta-blocker was stopped during last admission in September.  Beta-blocker was discontinued as patient was bradycardic to this admission.  Blood pressure is well controlled. 4. History of CAD-no chest pain.  Beta-blocker stopped.  Aspirin resumed.   5. Severe protein calorie malnutrition-dietitian consulted. 6. Anemia due to chronic illness-hemoglobin  is stable. 7. Severe malnutrition-BMI 22.09 kg/m, continue Ensure Enlive, MVI.     COVID-19 Labs  No results for input(s): DDIMER, FERRITIN, LDH, CRP in the last 72 hours.  Lab Results  Component Value Date   SARSCOV2NAA NEGATIVE 05/09/2020   SARSCOV2NAA POSITIVE (A) 03/22/2020     Scheduled medications:   . acetaminophen  1,000 mg Oral Q6H   Or  . acetaminophen (TYLENOL) oral liquid 160 mg/5 mL  1,000 mg Oral Q6H  . amLODipine  5 mg Oral Daily  . aspirin  325 mg Oral Daily  . bisacodyl  10 mg Oral Daily  . Chlorhexidine Gluconate Cloth  6 each Topical Daily  . docusate sodium  100 mg Oral BID  . enoxaparin (LOVENOX) injection  40 mg Subcutaneous Daily  . feeding supplement  237 mL Oral BID BM  . metoprolol tartrate  25 mg Oral BID  . multivitamin with minerals  1 tablet Oral Daily  . pantoprazole  40 mg Oral QAC breakfast  . polyethylene glycol  17 g Oral Daily  . potassium chloride SA  20 mEq Oral Daily  . senna-docusate  1 tablet Oral QHS  . Vitamin D (Ergocalciferol)  50,000 Units Oral Weekly         CBG: Recent Labs  Lab 05/11/20 1618 05/12/20 1209  GLUCAP 175* 115*    SpO2: 90 %    CBC: Recent Labs  Lab 05/11/20 0401 05/12/20 0107 05/13/20 0829 05/15/20 0029 05/16/20 0029  WBC 4.9 7.6 6.1 5.0 7.3  NEUTROABS 2.6  --   --   --   --   HGB 9.9* 9.0* 9.4* 9.7* 9.6*  HCT 31.1* 28.8* 29.7* 31.7* 30.7*  MCV 83.8  83.7 84.6 85.9 84.8  PLT 299 280 285 288 502    Basic Metabolic Panel: Recent Labs  Lab 05/11/20 0401 05/12/20 0107 05/13/20 0829 05/15/20 0029 05/16/20 0029  NA 136 136 136 135 136  K 4.2 4.4 3.9 4.6 4.3  CL 103 103 102 100 102  CO2 24 26 25 26 25   GLUCOSE 111* 157* 98 233* 113*  BUN 15 15 10 20 19   CREATININE 0.90 0.73 0.83 0.84 0.85  CALCIUM 9.3 9.1 9.0 9.2 9.2     Liver Function Tests: Recent Labs  Lab 05/10/20 1635 05/11/20 0401 05/13/20 0829 05/16/20 0029  AST 17 14* 17 19  ALT 14 13 11 15   ALKPHOS 65 60 56  55  BILITOT 0.5 0.6 0.5 0.7  PROT 7.2 7.0 6.5 6.7  ALBUMIN 2.7* 2.7* 2.5* 2.7*     Antibiotics: Anti-infectives (From admission, onward)   Start     Dose/Rate Route Frequency Ordered Stop   05/14/20 1315  ampicillin-sulbactam (UNASYN) 1.5 g in sodium chloride 0.9 % 100 mL IVPB  Status:  Discontinued        1.5 g 200 mL/hr over 30 Minutes Intravenous To ShortStay Surgical 05/14/20 1310 05/14/20 1542   05/11/20 0600  vancomycin (VANCOCIN) IVPB 1000 mg/200 mL premix        1,000 mg 200 mL/hr over 60 Minutes Intravenous On call to O.R. 05/10/20 1556 05/12/20 0733   05/10/20 1145  ampicillin-sulbactam (UNASYN) 1.5 g in sodium chloride 0.9 % 100 mL IVPB        1.5 g 200 mL/hr over 30 Minutes Intravenous Every 6 hours 05/10/20 1058     05/09/20 1330  cefTRIAXone (ROCEPHIN) 1 g in sodium chloride 0.9 % 100 mL IVPB  Status:  Discontinued        1 g 200 mL/hr over 30 Minutes Intravenous Every 24 hours 05/09/20 1321 05/10/20 1053       DVT prophylaxis: Lovenox  Code Status: Full code  Family Communication: No family at bedside   Consultants:  CT surgery  Procedures:  Thoracotomy and empyema drainage with rib resection and wound VAC application    Objective   Vitals:   05/16/20 1000 05/16/20 1100 05/16/20 1151 05/16/20 1200  BP: (!) 125/56  126/76 134/71  Pulse: (!) 51 (!) 56 77 72  Resp: 14 (!) 21  (!) 21  Temp:   98.1 F (36.7 C)   TempSrc:   Oral   SpO2: 96% 98% 97% 90%  Weight:      Height:        Intake/Output Summary (Last 24 hours) at 05/16/2020 1347 Last data filed at 05/16/2020 1300 Gross per 24 hour  Intake 640 ml  Output 1750 ml  Net -1110 ml    11/08 1901 - 11/10 0700 In: 957.4 [P.O.:120; I.V.:337.4] Out: 2750 [Urine:2725; Drains:25]  Filed Weights   05/10/20 0731 05/12/20 0620 05/14/20 1313  Weight: 66.5 kg 65.9 kg 65.9 kg    Physical Examination:   General-appears in no acute distress Heart-S1-S2, regular, no murmur  auscultated Lungs-clear to auscultation bilaterally, no wheezing or crackles auscultated, wound VAC in place Abdomen-soft, nontender, no organomegaly Extremities-no edema in the lower extremities Neuro-alert, oriented x3, no focal deficit noted  Status is: Inpatient  Dispo: The patient is from: Home              Anticipated d/c is to: Skilled nursing facility versus home  Anticipated d/c date is: 05/21/2020              Patient currently not medically stable for discharge  Barrier to discharge-ongoing treatment for empyema       Data Reviewed:   Recent Results (from the past 240 hour(s))  Culture, blood (routine x 2)     Status: None   Collection Time: 05/09/20  2:45 PM   Specimen: BLOOD  Result Value Ref Range Status   Specimen Description BLOOD RIGHT ANTECUBITAL  Final   Special Requests   Final    BOTTLES DRAWN AEROBIC AND ANAEROBIC Blood Culture adequate volume   Culture   Final    NO GROWTH 5 DAYS Performed at [facility], [address] [place], [place] [address]    Report Status 05/14/2020 FINAL  Final  Resp Panel by RT PCR (RSV, Flu A&B, Covid) - Nasopharyngeal Swab     Status: None   Collection Time: 05/09/20  2:54 PM   Specimen: Nasopharyngeal Swab  Result Value Ref Range Status   SARS Coronavirus 2 by RT PCR NEGATIVE NEGATIVE Final    Comment: (NOTE) SARS-CoV-2 target nucleic acids are NOT DETECTED.  The SARS-CoV-2 RNA is generally detectable in upper respiratoy specimens during the acute phase of infection. The lowest concentration of SARS-CoV-2 viral copies this assay can detect is 131 copies/mL. A negative result does not preclude SARS-Cov-2 infection and should not be used as the sole basis for treatment or other patient management decisions. A negative result may occur with  improper specimen collection/handling, submission of specimen other than nasopharyngeal swab, presence of viral mutation(s) within the areas targeted by this  assay, and inadequate number of viral copies (<131 copies/mL). A negative result must be combined with clinical observations, patient history, and epidemiological information. The expected result is Negative.  Fact Sheet for Patients:  PinkCheek.be  Fact Sheet for Healthcare Providers:  GravelBags.it  This test is no t yet approved or cleared by the [place] FDA and  has been authorized for detection and/or diagnosis of SARS-CoV-2 by FDA under an Emergency Use Authorization (EUA). This EUA will remain  in effect (meaning this test can be used) for the duration of the COVID-19 declaration under Section 564(b)(1) of the Act, 21 U.S.C. section 360bbb-3(b)(1), unless the authorization is terminated or revoked sooner.     Influenza A by PCR NEGATIVE NEGATIVE Final   Influenza B by PCR NEGATIVE NEGATIVE Final    Comment: (NOTE) The Xpert Xpress SARS-CoV-2/FLU/RSV assay is intended as an aid in  the diagnosis of influenza from Nasopharyngeal swab specimens and  should not be used as a sole basis for treatment. Nasal washings and  aspirates are unacceptable for Xpert Xpress SARS-CoV-2/FLU/RSV  testing.  Fact Sheet for Patients: PinkCheek.be  Fact Sheet for Healthcare Providers: GravelBags.it  This test is not yet approved or cleared by the [place] FDA and  has been authorized for detection and/or diagnosis of SARS-CoV-2 by  FDA under an Emergency Use Authorization (EUA). This EUA will remain  in effect (meaning this test can be used) for the duration of the  Covid-19 declaration under Section 564(b)(1) of the Act, 21  U.S.C. section 360bbb-3(b)(1), unless the authorization is  terminated or revoked.    Respiratory Syncytial Virus by PCR NEGATIVE NEGATIVE Final    Comment: (NOTE) Fact Sheet for Patients: PinkCheek.be  Fact  Sheet for Healthcare Providers: GravelBags.it  This test is not yet approved or cleared by the [place]  FDA and  has been authorized for detection and/or diagnosis of SARS-CoV-2 by  FDA under an Emergency Use Authorization (EUA). This EUA will remain  in effect (meaning this test can be used) for the duration of the  COVID-19 declaration under Section 564(b)(1) of the Act, 21 U.S.C.  section 360bbb-3(b)(1), unless the authorization is terminated or  revoked. Performed at [facility], [place] [address], [place], [place] [address]   Culture, blood (routine x 2)     Status: None   Collection Time: 05/10/20  1:44 AM   Specimen: BLOOD  Result Value Ref Range Status   Specimen Description BLOOD SITE NOT SPECIFIED  Final   Special Requests   Final    BOTTLES DRAWN AEROBIC AND ANAEROBIC Blood Culture results may not be optimal due to an excessive volume of blood received in culture bottles   Culture   Final    NO GROWTH 5 DAYS Performed at [facility], [place] [address] [place], [place] [address]    Report Status 05/15/2020 FINAL  Final  Surgical pcr screen     Status: None   Collection Time: 05/10/20  8:48 PM   Specimen: Nasal Mucosa; Nasal Swab  Result Value Ref Range Status   MRSA, PCR NEGATIVE NEGATIVE Final   Staphylococcus aureus NEGATIVE NEGATIVE Final    Comment: (NOTE) The Xpert SA Assay (FDA approved for NASAL specimens in patients 70 years of age and older), is one component of a comprehensive surveillance program. It is not intended to diagnose infection nor to guide or monitor treatment. Performed at [facility], [place] [address], [place], [place] [address]   Aerobic/Anaerobic Culture (surgical/deep wound)     Status: None (Preliminary result)   Collection Time: 05/11/20  9:55 AM   Specimen: PATH Cytology Pleural fluid; Body Fluid  Result Value Ref Range Status   Specimen Description FLUID PLEURAL RIGHT EMPYEMA  Final    Special Requests A  Final   Gram Stain   Final    WBC PRESENT,BOTH PMN AND MONONUCLEAR NO ORGANISMS SEEN    Culture   Final    NO GROWTH 4 DAYS NO ANAEROBES ISOLATED; CULTURE IN PROGRESS FOR 5 DAYS Performed at [facility], [place] [address], [place], [place] [address]    Report Status PENDING  Incomplete  Aerobic/Anaerobic Culture (surgical/deep wound)     Status: None (Preliminary result)   Collection Time: 05/11/20  9:58 AM   Specimen: PATH Soft tissue  Result Value Ref Range Status   Specimen Description TISSUE RIGHT EMPYEMA  Final   Special Requests B  Final   Gram Stain   Final    FEW WBC PRESENT,BOTH PMN AND MONONUCLEAR NO ORGANISMS SEEN    Culture   Final    NO GROWTH 4 DAYS NO ANAEROBES ISOLATED; CULTURE IN PROGRESS FOR 5 DAYS Performed at [facility], [address], [place], [place] [address]    Report Status PENDING  Incomplete  Aerobic/Anaerobic Culture (surgical/deep wound)     Status: None (Preliminary result)   Collection Time: 05/11/20 10:00 AM   Specimen: PATH Cytology Pleural fluid; Body Fluid  Result Value Ref Range Status   Specimen Description FLUID CYTO RIGHT EMPYEMA  Final   Special Requests FLUID C  Final   Gram Stain   Final    ABUNDANT WBC PRESENT,BOTH PMN AND MONONUCLEAR NO ORGANISMS SEEN    Culture   Final    NO GROWTH 4 DAYS NO  ANAEROBES ISOLATED; CULTURE IN PROGRESS FOR 5 DAYS Performed at [facility], [place] [address], [place], [place] [address]    Report Status PENDING  Incomplete  Aerobic/Anaerobic Culture (surgical/deep wound)     Status: None (Preliminary result)   Collection Time: 05/11/20 10:01 AM   Specimen: PATH Soft tissue resection  Result Value Ref Range Status   Specimen Description TISSUE RIGHT EMPYEMA  Final   Special Requests DIALYSIS  Final   Gram Stain   Final    MODERATE WBC PRESENT,BOTH PMN AND MONONUCLEAR NO ORGANISMS SEEN    Culture   Final    NO GROWTH 4 DAYS NO ANAEROBES ISOLATED; CULTURE IN  PROGRESS FOR 5 DAYS Performed at [facility], [place] [address], [place], [place] [address]    Report Status PENDING  Incomplete    BNP (last 3 results) Recent Labs    03/28/20 0830 03/29/20 0033 03/30/20 1054  BNP 67.4 50.5 70.6     Studies:  DG Chest Port 1 View  Result Date: 05/15/2020 CLINICAL DATA:  History of empyema.  Short of breath EXAM: PORTABLE CHEST 1 VIEW COMPARISON:  05/12/2020 FINDINGS: Postsurgical changes right lung base. Mild improvement in right lower lobe airspace disease. No effusion. Left lung clear.  Negative for heart failure or pleural effusion IMPRESSION: Interval improvement in right lower lobe aeration with decreased airspace disease. No pleural effusion. Electronically Signed   By: Franchot Gallo M.D.   On: 05/15/2020 07:57       [place]   [facility] If 7PM-7AM, please contact night-coverage at [email or web address], Office  [phone]   05/16/2020, 1:47 PM  LOS: 7 days

## 2020-05-16 NOTE — Progress Notes (Addendum)
      [address]       [place] [address]             [phone]      2 Days Post-Op Procedure(s) (LRB): WOUND VAC CHANGE (Right)   Subjective:  Awake and alert, says he feels good and pain is well controlled. Tolerating PO's The wound vac hose became disconnected last evening when he was maneuvering in bed. The hose was replaced and has been functioning appropriately.   Objective: Vital signs in last 24 hours: Temp:  [97.5 F (36.4 C)-97.8 F (36.6 C)] 97.6 F (36.4 C) (11/10 0334) Pulse Rate:  [62-90] 62 (11/10 0334) Cardiac Rhythm: Normal sinus rhythm (11/10 0741) Resp:  [18-23] 18 (11/10 0334) BP: (97-133)/(59-83) 133/72 (11/10 0334) SpO2:  [97 %-99 %] 98 % (11/10 0334)  Intake/Output from previous day: 11/09 0701 - 11/10 0700 In: 42 [P.O.:120; IV Piggyback:400] Out: 2425 [Urine:2400; Drains:25]  General appearance: alert, cooperative and no distress Heart: regular rate and rhythm Lungs: clear to auscultation bilaterally Wound: wound vac in place and functioning appropriately. Thin serosanguinous drainage in the canister (79ml/24 hours)  Lab Results: Recent Labs    05/15/20 0029 05/16/20 0029  WBC 5.0 7.3  HGB 9.7* 9.6*  HCT 31.7* 30.7*  PLT 288 322   BMET:  Recent Labs    05/15/20 0029 05/16/20 0029  NA 135 136  K 4.6 4.3  CL 100 102  CO2 26 25  GLUCOSE 233* 113*  BUN 20 19  CREATININE 0.84 0.85  CALCIUM 9.2 9.2    PT/INR:  No results for input(s): LABPROT, INR in the last 72 hours. ABG    Component Value Date/Time   PHART 7.467 (H) 05/10/2020 1715   HCO3 24.0 05/10/2020 1715   TCO2 25 03/06/2009 0437   ACIDBASEDEF 0.5 03/01/2009 0922   O2SAT 97.4 05/10/2020 1715   CBG (last 3)  No results for input(s): GLUCAP in the last 72 hours.  Assessment/Plan: S/P Procedure(s) (LRB): WOUND VAC CHANGE (Right)  1. S/P Right Thoracotomy with rib resection, drainage of empyema.Marland Kitchen wound vac in place with appropriate function.  Encourage ambulation.  2. ID-afebrile, no leukocytosis. OR culture shows no growth at 3 days, on IV ABX 3. Dispo- patient stable, continue ABX, vac change Thursday or Friday in OR.  LOS: 7 days   Antony Odea, PA-C 05/16/2020 Patient seen and examined, agree with above Will plan to change [place]. Roxan Hockey, MD [facility] [phone]

## 2020-05-16 NOTE — TOC Transition Note (Addendum)
Transition of Care [facility]) - CM/SW Discharge Note   Patient Details  Name: Mathew Hall MRN: [id number] Date of Birth: Jul 30, 1940  [facility] CM/SW Contact:  Zenon Mayo, RN Phone Number: 05/16/2020, 3:49 PM   Clinical Narrative:    NCM spoke with patient at bedside, NCM offered choice , he states he has no preference, NCM made referral to Amy with [facility], she states she will send in referral and get back with me.  Informed her HHRN is for wound vac.  Olivia Mackie with [facility] is working on getting the wound vac.  Patient states he has two walker and 2 canes at home.  Patient will most likely be here til next week.  Amy  States she can not take the referral due to staffing. NCM made referral to [facility] with [facility], awaiting to hear back. Kenzie with [facility] states they can take referral for [facility].   Final next level of care: [place] Barriers to Discharge: Continued Medical Work up   Patient Goals and CMS Choice Patient states their goals for this hospitalization and ongoing recovery are:: to get better CMS Medicare.gov Compare Post Acute Care list provided to:: Patient Choice offered to / list presented to : Patient  Discharge Placement                       Discharge Plan and Services   Discharge Planning Services: CM Consult            DME Arranged: Negative pressure wound device DME Agency: [facility] Date DME Agency Contacted: 05/15/20 Time DME Agency Contacted: 1500 Representative spoke with at DME Agency: Olivia Mackie HH Arranged: RN [place] Agency: [facility] [place] Date [place]: 05/16/20 Time [place]: [place] Representative spoke with at [place]: Amy  Social Determinants of Health ([place]) Interventions     Readmission Risk Interventions No flowsheet data found.

## 2020-05-17 ENCOUNTER — Encounter (HOSPITAL_COMMUNITY)
Admission: EM | Disposition: A | Discharge status: Home Health | Payer: Self-pay | Source: Home / Self Care | Attending: Family Medicine

## 2020-05-17 ENCOUNTER — Inpatient Hospital Stay (HOSPITAL_COMMUNITY): Payer: Medicare Other | Admitting: Registered Nurse

## 2020-05-17 ENCOUNTER — Encounter (HOSPITAL_COMMUNITY): Payer: Self-pay | Admitting: Thoracic Surgery (Cardiothoracic Vascular Surgery)

## 2020-05-17 DIAGNOSIS — J869 Pyothorax without fistula: Secondary | ICD-10-CM | POA: Diagnosis not present

## 2020-05-17 DIAGNOSIS — E43 Unspecified severe protein-calorie malnutrition: Secondary | ICD-10-CM | POA: Diagnosis not present

## 2020-05-17 HISTORY — PX: APPLICATION OF WOUND VAC: SHX5189

## 2020-05-17 LAB — GLUCOSE, CAPILLARY: Glucose-Capillary: 91 mg/dL (ref 70–99)

## 2020-05-17 SURGERY — APPLICATION, WOUND VAC
Anesthesia: Monitor Anesthesia Care | Site: Chest | Laterality: Right

## 2020-05-17 MED ORDER — 0.9 % SODIUM CHLORIDE (POUR BTL) OPTIME
TOPICAL | Status: DC | PRN
  Administered 2020-05-17: 3000 mL

## 2020-05-17 MED ORDER — PROPOFOL 500 MG/50ML IV EMUL
INTRAVENOUS | Status: DC | PRN
  Administered 2020-05-17: 75 ug/kg/min via INTRAVENOUS

## 2020-05-17 MED ORDER — OXYCODONE HCL 5 MG PO TABS: 5.0000 mg | ORAL_TABLET | Freq: Once | ORAL | Status: DC | PRN

## 2020-05-17 MED ORDER — EPHEDRINE 5 MG/ML INJ
INTRAVENOUS | Status: AC
  Filled 2020-05-17: qty 10

## 2020-05-17 MED ORDER — DEXAMETHASONE SODIUM PHOSPHATE 10 MG/ML IJ SOLN
INTRAMUSCULAR | Status: AC
  Filled 2020-05-17: qty 1

## 2020-05-17 MED ORDER — ONDANSETRON HCL 4 MG/2ML IJ SOLN
INTRAMUSCULAR | Status: AC
  Filled 2020-05-17: qty 2

## 2020-05-17 MED ORDER — PHENYLEPHRINE 40 MCG/ML (10ML) SYRINGE FOR IV PUSH (FOR BLOOD PRESSURE SUPPORT)
PREFILLED_SYRINGE | INTRAVENOUS | Status: DC | PRN
  Administered 2020-05-17: 80 ug via INTRAVENOUS

## 2020-05-17 MED ORDER — MIDAZOLAM HCL 2 MG/2ML IJ SOLN
INTRAMUSCULAR | Status: AC
  Filled 2020-05-17: qty 2

## 2020-05-17 MED ORDER — PROPOFOL 10 MG/ML IV BOLUS
INTRAVENOUS | Status: DC | PRN
  Administered 2020-05-17 (×2): 20 mg via INTRAVENOUS

## 2020-05-17 MED ORDER — ONDANSETRON HCL 4 MG/2ML IJ SOLN
INTRAMUSCULAR | Status: DC | PRN
  Administered 2020-05-17: 4 mg via INTRAVENOUS

## 2020-05-17 MED ORDER — PHENYLEPHRINE 40 MCG/ML (10ML) SYRINGE FOR IV PUSH (FOR BLOOD PRESSURE SUPPORT)
PREFILLED_SYRINGE | INTRAVENOUS | Status: AC
  Filled 2020-05-17: qty 10

## 2020-05-17 MED ORDER — ACETAMINOPHEN 500 MG PO TABS: 1000.0000 mg | ORAL_TABLET | Freq: Once | ORAL | Status: DC | PRN

## 2020-05-17 MED ORDER — CHLORHEXIDINE GLUCONATE 0.12 % MT SOLN
OROMUCOSAL | Status: AC
  Administered 2020-05-17: 15 mL via OROMUCOSAL
  Filled 2020-05-17: qty 15

## 2020-05-17 MED ORDER — MIDAZOLAM HCL 5 MG/5ML IJ SOLN
INTRAMUSCULAR | Status: DC | PRN
  Administered 2020-05-17 (×2): 1 mg via INTRAVENOUS

## 2020-05-17 MED ORDER — ORAL CARE MOUTH RINSE: 15.0000 mL | Freq: Once | OROMUCOSAL | Status: AC

## 2020-05-17 MED ORDER — OXYCODONE HCL 5 MG/5ML PO SOLN: 5.0000 mg | Freq: Once | ORAL | Status: DC | PRN

## 2020-05-17 MED ORDER — EPHEDRINE SULFATE-NACL 50-0.9 MG/10ML-% IV SOSY
PREFILLED_SYRINGE | INTRAVENOUS | Status: DC | PRN
  Administered 2020-05-17 (×2): 5 mg via INTRAVENOUS
  Administered 2020-05-17: 10 mg via INTRAVENOUS
  Administered 2020-05-17: 5 mg via INTRAVENOUS

## 2020-05-17 MED ORDER — LIDOCAINE 2% (20 MG/ML) 5 ML SYRINGE
INTRAMUSCULAR | Status: AC
  Filled 2020-05-17: qty 5

## 2020-05-17 MED ORDER — ACETAMINOPHEN 10 MG/ML IV SOLN: 1000.0000 mg | Freq: Once | INTRAVENOUS | Status: DC | PRN

## 2020-05-17 MED ORDER — FENTANYL CITRATE (PF) 100 MCG/2ML IJ SOLN: 25.0000 ug | INTRAMUSCULAR | Status: DC | PRN

## 2020-05-17 MED ORDER — CHLORHEXIDINE GLUCONATE 0.12 % MT SOLN: 15.0000 mL | Freq: Once | OROMUCOSAL | Status: AC

## 2020-05-17 MED ORDER — FENTANYL CITRATE (PF) 250 MCG/5ML IJ SOLN
INTRAMUSCULAR | Status: AC
  Filled 2020-05-17: qty 5

## 2020-05-17 MED ORDER — LIDOCAINE 2% (20 MG/ML) 5 ML SYRINGE
INTRAMUSCULAR | Status: DC | PRN
  Administered 2020-05-17: 60 mg via INTRAVENOUS

## 2020-05-17 MED ORDER — ACETAMINOPHEN 160 MG/5ML PO SOLN: 1000.0000 mg | Freq: Once | ORAL | Status: DC | PRN

## 2020-05-17 MED ORDER — LACTATED RINGERS IV SOLN: INTRAVENOUS | Status: DC

## 2020-05-17 MED ORDER — DEXAMETHASONE SODIUM PHOSPHATE 10 MG/ML IJ SOLN
INTRAMUSCULAR | Status: DC | PRN
  Administered 2020-05-17: 5 mg via INTRAVENOUS

## 2020-05-17 MED ORDER — FENTANYL CITRATE (PF) 250 MCG/5ML IJ SOLN
INTRAMUSCULAR | Status: DC | PRN
  Administered 2020-05-17 (×2): 25 ug via INTRAVENOUS

## 2020-05-17 SURGICAL SUPPLY — 45 items
BLADE CLIPPER SURG (BLADE) ×3 IMPLANT
BLADE SURG 10 STRL SS (BLADE) ×3 IMPLANT
CANISTER SUCT 3000ML PPV (MISCELLANEOUS) ×3 IMPLANT
CNTNR URN SCR LID CUP LEK RST (MISCELLANEOUS) IMPLANT
CONT SPEC 4OZ STRL OR WHT (MISCELLANEOUS)
COVER SURGICAL LIGHT HANDLE (MISCELLANEOUS) ×2 IMPLANT
DRAPE LAPAROSCOPIC ABDOMINAL (DRAPES) ×3 IMPLANT
DRAPE SLUSH/WARMER DISC (DRAPES) IMPLANT
DRAPE WARM FLUID 44X44 (DRAPES) ×2 IMPLANT
DRSG PAD ABDOMINAL 8X10 ST (GAUZE/BANDAGES/DRESSINGS) IMPLANT
DRSG VAC ATS MED SENSATRAC (GAUZE/BANDAGES/DRESSINGS) ×2 IMPLANT
DRSG VERSA FOAM LRG 10X15 (GAUZE/BANDAGES/DRESSINGS) ×2 IMPLANT
ELECT REM PT RETURN 9FT ADLT (ELECTROSURGICAL) ×3
ELECTRODE REM PT RTRN 9FT ADLT (ELECTROSURGICAL) ×1 IMPLANT
GAUZE SPONGE 4X4 12PLY STRL (GAUZE/BANDAGES/DRESSINGS) ×3 IMPLANT
GLOVE ECLIPSE 8.0 STRL XLNG CF (GLOVE) ×2 IMPLANT
GLOVE SURG SIGNA 7.5 PF LTX (GLOVE) ×6 IMPLANT
GOWN STRL REUS W/ TWL LRG LVL3 (GOWN DISPOSABLE) ×2 IMPLANT
GOWN STRL REUS W/ TWL XL LVL3 (GOWN DISPOSABLE) ×1 IMPLANT
GOWN STRL REUS W/TWL LRG LVL3 (GOWN DISPOSABLE) ×6
GOWN STRL REUS W/TWL XL LVL3 (GOWN DISPOSABLE) ×3
HANDPIECE INTERPULSE COAX TIP (DISPOSABLE)
HEMOSTAT POWDER SURGIFOAM 1G (HEMOSTASIS) IMPLANT
KIT BASIN OR (CUSTOM PROCEDURE TRAY) ×3 IMPLANT
KIT TURNOVER KIT B (KITS) ×3 IMPLANT
NS IRRIG 1000ML POUR BTL (IV SOLUTION) ×8 IMPLANT
PACK CHEST (CUSTOM PROCEDURE TRAY) ×3 IMPLANT
PAD ARMBOARD 7.5X6 YLW CONV (MISCELLANEOUS) ×6 IMPLANT
SET HNDPC FAN SPRY TIP SCT (DISPOSABLE) IMPLANT
SPONGE LAP 18X18 RF (DISPOSABLE) ×3 IMPLANT
SUT STEEL 6MS V (SUTURE) IMPLANT
SUT STEEL STERNAL CCS#1 18IN (SUTURE) IMPLANT
SUT STEEL SZ 6 DBL 3X14 BALL (SUTURE) IMPLANT
SUT VIC AB 1 CTX 36 (SUTURE)
SUT VIC AB 1 CTX36XBRD ANBCTR (SUTURE) ×2 IMPLANT
SUT VIC AB 2-0 CTX 27 (SUTURE) ×2 IMPLANT
SUT VIC AB 3-0 X1 27 (SUTURE) ×2 IMPLANT
SWAB COLLECTION DEVICE MRSA (MISCELLANEOUS) IMPLANT
SWAB CULTURE ESWAB REG 1ML (MISCELLANEOUS) IMPLANT
SYR 5ML LL (SYRINGE) IMPLANT
SYR BULB IRRIG 60ML STRL (SYRINGE) ×2 IMPLANT
TOWEL GREEN STERILE (TOWEL DISPOSABLE) ×3 IMPLANT
TOWEL GREEN STERILE FF (TOWEL DISPOSABLE) ×3 IMPLANT
TRAY FOLEY MTR SLVR 14FR STAT (SET/KITS/TRAYS/PACK) IMPLANT
WATER STERILE IRR 1000ML POUR (IV SOLUTION) ×3 IMPLANT

## 2020-05-17 NOTE — Anesthesia Preprocedure Evaluation (Signed)
Anesthesia Evaluation  Patient identified by MRN, date of birth, ID band Patient awake    Reviewed: Allergy & Precautions, NPO status , Patient's Chart, lab work & pertinent test results  History of Anesthesia Complications Negative for: history of anesthetic complications  Airway Mallampati: II  TM Distance: >3 FB Neck ROM: Full    Dental  (+) Dental Advisory Given   Pulmonary asthma , COPD, former smoker,    breath sounds clear to auscultation       Cardiovascular hypertension, Pt. on medications and Pt. on home beta blockers + CAD, + Past MI and + Cardiac Stents   Rhythm:Regular Rate:Normal     Neuro/Psych negative neurological ROS  negative psych ROS   GI/Hepatic Neg liver ROS, GERD  Medicated,  Endo/Other  negative endocrine ROS  Renal/GU negative Renal ROS     Musculoskeletal negative musculoskeletal ROS (+)   Abdominal   Peds  Hematology  (+) Blood dyscrasia, anemia , Lab Results      Component                Value               Date                      WBC                      7.3                 05/16/2020                HGB                      9.6 (L)             05/16/2020                HCT                      30.7 (L)            05/16/2020                MCV                      84.8                05/16/2020                PLT                      322                 05/16/2020              Anesthesia Other Findings   Reproductive/Obstetrics                             Anesthesia Physical Anesthesia Plan  ASA: III  Anesthesia Plan: MAC   Post-op Pain Management:    Induction: Intravenous  PONV Risk Score and Plan: 1 and Treatment may vary due to age or medical condition and Propofol infusion  Airway Management Planned: Nasal Cannula  Additional Equipment: None  Intra-op Plan:   Post-operative Plan:   Informed Consent: I have reviewed the patients  History and Physical, chart, labs and discussed the  procedure including the risks, benefits and alternatives for the proposed anesthesia with the patient or authorized representative who has indicated his/her understanding and acceptance.     Dental advisory given  Plan Discussed with: CRNA and Surgeon  Anesthesia Plan Comments:         Anesthesia Quick Evaluation

## 2020-05-17 NOTE — Anesthesia Procedure Notes (Signed)
Procedure Name: MAC Date/Time: 05/17/2020 12:38 PM Performed by: Trinna Post., CRNA Pre-anesthesia Checklist: Patient identified, Emergency Drugs available, Suction available, Patient being monitored and Timeout performed Patient Re-evaluated:Patient Re-evaluated prior to induction Oxygen Delivery Method: Simple face mask Preoxygenation: Pre-oxygenation with 100% oxygen Induction Type: IV induction Placement Confirmation: positive ETCO2

## 2020-05-17 NOTE — Transfer of Care (Signed)
Immediate Anesthesia Transfer of Care Note  Patient: THADIUS SMISEK  Procedure(s) Performed: WOUND VAC CHANGE (Right Chest)  Patient Location: PACU  Anesthesia Type:MAC  Level of Consciousness: awake, alert  and oriented  Airway & Oxygen Therapy: Patient Spontanous Breathing and Patient connected to face mask oxygen  Post-op Assessment: Report given to RN and Post -op Vital signs reviewed and stable  Post vital signs: Reviewed and stable  Last Vitals:  Vitals Value Taken Time  BP 109/64 05/17/20 1325  Temp 36.3 C 05/17/20 1325  Pulse 75 05/17/20 1328  Resp 12 05/17/20 1328  SpO2 98 % 05/17/20 1328  Vitals shown include unvalidated device data.  Last Pain:  Vitals:   05/17/20 1143  TempSrc:   PainSc: 2       Patients Stated Pain Goal: 0 (39/58/44 1712)  Complications: No complications documented.

## 2020-05-17 NOTE — Progress Notes (Addendum)
      [address]       [place] [address]             [phone]      2 Days Post-Op Procedure(s) (LRB): WOUND VAC CHANGE (Right)   Subjective:  Awake and alert, says he feels good and pain is well controlled.  No new concerns.  Objective: Vital signs in last 24 hours: Temp:  [97.7 F (36.5 C)-98.3 F (36.8 C)] 97.8 F (36.6 C) (11/11 0800) Pulse Rate:  [51-77] 64 (11/11 0800) Cardiac Rhythm: Sinus bradycardia (11/11 0745) Resp:  [13-22] 22 (11/11 0311) BP: (100-134)/(36-76) 118/54 (11/11 0800) SpO2:  [90 %-99 %] 97 % (11/11 0800)  Intake/Output from previous day: 11/10 0701 - 11/11 0700 In: 647.3 [P.O.:240; IV Piggyback:407.3] Out: 725 [Urine:650; Drains:75]  General appearance: alert, cooperative and no distress Heart: regular rate and rhythm Lungs: clear to auscultation bilaterally Wound: wound vac in place and functioning appropriately. Thin serosanguinous drainage in the canister (25ml/24 hours)  Lab Results: Recent Labs    05/15/20 0029 05/16/20 0029  WBC 5.0 7.3  HGB 9.7* 9.6*  HCT 31.7* 30.7*  PLT 288 322   BMET:  Recent Labs    05/15/20 0029 05/16/20 0029  NA 135 136  K 4.6 4.3  CL 100 102  CO2 26 25  GLUCOSE 233* 113*  BUN 20 19  CREATININE 0.84 0.85  CALCIUM 9.2 9.2    PT/INR:  No results for input(s): LABPROT, INR in the last 72 hours. ABG    Component Value Date/Time   PHART 7.467 (H) 05/10/2020 1715   HCO3 24.0 05/10/2020 1715   TCO2 25 03/06/2009 0437   ACIDBASEDEF 0.5 03/01/2009 0922   O2SAT 97.4 05/10/2020 1715   CBG (last 3)  No results for input(s): GLUCAP in the last 72 hours.  Assessment/Plan: S/P Procedure(s) (LRB): WOUND VAC CHANGE (Right)  1. S/P Right Thoracotomy with rib resection, drainage of empyema.Marland Kitchen wound vac in place with appropriate function.  2. ID-afebrile, no leukocytosis. OR culture shows no growth at 3 days, on IV ABX 3. Dispo- patient stable, continue ABX, vac change in OR later  today.   LOS: 8 days   Antony Odea, PA-C 05/17/2020 Patient seen and examined, agree with above VAC change today  Remo Lipps C. Roxan Hockey, MD [facility] [phone]

## 2020-05-17 NOTE — Progress Notes (Signed)
[facility]  PROGRESS NOTE  Mathew Hall GHW:[id number] DOB: 07/23/1940 DOA: 05/09/2020 PCP: [name], MD   Brief HPI:   79 year old male with history of recent COVID-19 infection with empyema treated with antibiotics, hypertension, GERD, CAD s/p PCI presents with complaints of worsening back pain.  He was found to have recurrent empyema and failed outpatient treatment.  Underwent thoracotomy on 05/11/2020 with empyema drainage and rib resection as well as application of wound VAC.    Subjective   Patient seen and examined, denies any pain.  Plan to go to the OR for wound VAC change today.   Assessment/Plan:     1. Empyema necessitates right chest-s/p thoracotomy and drainage of empyema with rib resection as well as application of wound VAC on 05/11/2020.  Patient was initially treated with chest tube and IV antibiotics in September followed by oral antibiotics outpatient.  He presented with worsening chest pain and was found to have persistent subcutaneous mass.  CT showed evidence of persistent empyema.  Patient underwent above procedure.  Continue IV Unasyn.  We will switch to p.o. Augmentin once wound VAC is off.  MRSA PCR is negative.  Wound culture is currently pending.  Plan to change wound VAC today.  CT surgery following. 2. 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  He was seen by Dr. Worthy Flank 15 years ago.  S/p neoadjuvant chemotherapy followed by right middle lobectomy and lymph node dissection.  Cytology was negative from pleural fluid in September. 3. Essential hypertension-blood pressures controlled, continue Norvasc.  Beta-blocker was stopped during last admission in September.  Beta-blocker was discontinued as patient was bradycardic to this admission.  Blood pressure is well controlled. 4. History of CAD-no chest pain.  Beta-blocker stopped.  Aspirin resumed.   5. Severe protein calorie  malnutrition-dietitian consulted. 6. Anemia due to chronic illness-hemoglobin is stable. 7. Severe malnutrition-BMI 22.09 kg/m, continue Ensure Enlive, MVI.     COVID-19 Labs  No results for input(s): DDIMER, FERRITIN, LDH, CRP in the last 72 hours.  Lab Results  Component Value Date   SARSCOV2NAA NEGATIVE 05/09/2020   SARSCOV2NAA POSITIVE (A) 03/22/2020     Scheduled medications:    [MAR Hold] acetaminophen  1,000 mg Oral Q6H   Or   [MAR Hold] acetaminophen (TYLENOL) oral liquid 160 mg/5 mL  1,000 mg Oral Q6H   [MAR Hold] amLODipine  5 mg Oral Daily   [MAR Hold] aspirin  325 mg Oral Daily   [MAR Hold] bisacodyl  10 mg Oral Daily   chlorhexidine  15 mL Mouth/Throat Once   Or   mouth rinse  15 mL Mouth Rinse Once   chlorhexidine       [MAR Hold] Chlorhexidine Gluconate Cloth  6 each Topical Daily   [MAR Hold] docusate sodium  100 mg Oral BID   [MAR Hold] enoxaparin (LOVENOX) injection  40 mg Subcutaneous Daily   [MAR Hold] feeding supplement  237 mL Oral BID BM   [MAR Hold] metoprolol tartrate  25 mg Oral BID   [MAR Hold] multivitamin with minerals  1 tablet Oral Daily   [MAR Hold] pantoprazole  40 mg Oral QAC breakfast   [MAR Hold] polyethylene glycol  17 g Oral Daily   [MAR Hold] potassium chloride SA  20 mEq Oral Daily   [MAR Hold] senna-docusate  1 tablet Oral QHS   [MAR Hold] Vitamin D (Ergocalciferol)  50,000 Units Oral Weekly  CBG: Recent Labs  Lab 05/11/20 1618 05/12/20 1209  GLUCAP 175* 115*    SpO2: 97 %    CBC: Recent Labs  Lab 05/11/20 0401 05/12/20 0107 05/13/20 0829 05/15/20 0029 05/16/20 0029  WBC 4.9 7.6 6.1 5.0 7.3  NEUTROABS 2.6  --   --   --   --   HGB 9.9* 9.0* 9.4* 9.7* 9.6*  HCT 31.1* 28.8* 29.7* 31.7* 30.7*  MCV 83.8 83.7 84.6 85.9 84.8  PLT 299 280 285 288 599    Basic Metabolic Panel: Recent Labs  Lab 05/11/20 0401 05/12/20 0107 05/13/20 0829 05/15/20 0029 05/16/20 0029  NA 136 136 136  135 136  K 4.2 4.4 3.9 4.6 4.3  CL 103 103 102 100 102  CO2 24 26 25 26 25   GLUCOSE 111* 157* 98 233* 113*  BUN 15 15 10 20 19   CREATININE 0.90 0.73 0.83 0.84 0.85  CALCIUM 9.3 9.1 9.0 9.2 9.2     Liver Function Tests: Recent Labs  Lab 05/10/20 1635 05/11/20 0401 05/13/20 0829 05/16/20 0029  AST 17 14* 17 19  ALT 14 13 11 15   ALKPHOS 65 60 56 55  BILITOT 0.5 0.6 0.5 0.7  PROT 7.2 7.0 6.5 6.7  ALBUMIN 2.7* 2.7* 2.5* 2.7*     Antibiotics: Anti-infectives (From admission, onward)   Start     Dose/Rate Route Frequency Ordered Stop   05/14/20 1315  ampicillin-sulbactam (UNASYN) 1.5 g in sodium chloride 0.9 % 100 mL IVPB  Status:  Discontinued        1.5 g 200 mL/hr over 30 Minutes Intravenous To ShortStay Surgical 05/14/20 1310 05/14/20 1542   05/11/20 0600  vancomycin (VANCOCIN) IVPB 1000 mg/200 mL premix        1,000 mg 200 mL/hr over 60 Minutes Intravenous On call to O.R. 05/10/20 1556 05/12/20 0733   05/10/20 1145  [MAR Hold]  ampicillin-sulbactam (UNASYN) 1.5 g in sodium chloride 0.9 % 100 mL IVPB        (MAR Hold since Thu 05/17/2020 at 1122.Hold Reason: Transfer to a Procedural area.)   1.5 g 200 mL/hr over 30 Minutes Intravenous Every 6 hours 05/10/20 1058     05/09/20 1330  cefTRIAXone (ROCEPHIN) 1 g in sodium chloride 0.9 % 100 mL IVPB  Status:  Discontinued        1 g 200 mL/hr over 30 Minutes Intravenous Every 24 hours 05/09/20 1321 05/10/20 1053       DVT prophylaxis: Lovenox  Code Status: Full code  Family Communication: No family at bedside   Consultants:  CT surgery  Procedures:  Thoracotomy and empyema drainage with rib resection and wound VAC application    Objective   Vitals:   05/16/20 2000 05/16/20 2351 05/17/20 0311 05/17/20 0800  BP: 100/70 (!) 111/52 114/69 (!) 118/54  Pulse: 64 72 60 64  Resp: 16 (!) 21 (!) 22 18  Temp: 97.7 F (36.5 C) 98.3 F (36.8 C)  97.8 F (36.6 C)  TempSrc: Oral Oral  Oral  SpO2: 97% 95% 94% 97%   Weight:      Height:        Intake/Output Summary (Last 24 hours) at 05/17/2020 1140 Last data filed at 05/17/2020 0900 Gross per 24 hour  Intake 527.31 ml  Output 1100 ml  Net -572.69 ml    11/09 1901 - 11/11 0700 In: 747.3 [P.O.:240] Out: 2350 [Urine:2275; Drains:75]  Filed Weights   05/10/20 0731 05/12/20 0620 05/14/20 1313  Weight: 66.5  kg 65.9 kg 65.9 kg    Physical Examination:   General-appears in no acute distress Heart-S1-S2, regular, no murmur auscultated Lungs-clear to auscultation bilaterally, no wheezing or crackles auscultated, wound VAC in place Abdomen-soft, nontender, no organomegaly Extremities-no edema in the lower extremities Neuro-alert, oriented x3, no focal deficit noted  Status is: Inpatient  Dispo: The patient is from: Home              Anticipated d/c is to: Skilled nursing facility versus home              Anticipated d/c date is: 05/21/2020              Patient currently not medically stable for discharge  Barrier to discharge-ongoing treatment for empyema       Data Reviewed:   Recent Results (from the past 240 hour(s))  Culture, blood (routine x 2)     Status: None   Collection Time: 05/09/20  2:45 PM   Specimen: BLOOD  Result Value Ref Range Status   Specimen Description BLOOD RIGHT ANTECUBITAL  Final   Special Requests   Final    BOTTLES DRAWN AEROBIC AND ANAEROBIC Blood Culture adequate volume   Culture   Final    NO GROWTH 5 DAYS Performed at [facility], [address], [place], [place] [address]    Report Status 05/14/2020 FINAL  Final  Resp Panel by RT PCR (RSV, Flu A&B, Covid) - Nasopharyngeal Swab     Status: None   Collection Time: 05/09/20  2:54 PM   Specimen: Nasopharyngeal Swab  Result Value Ref Range Status   SARS Coronavirus 2 by RT PCR NEGATIVE NEGATIVE Final    Comment: (NOTE) SARS-CoV-2 target nucleic acids are NOT DETECTED.  The SARS-CoV-2 RNA is generally detectable in upper  respiratoy specimens during the acute phase of infection. The lowest concentration of SARS-CoV-2 viral copies this assay can detect is 131 copies/mL. A negative result does not preclude SARS-Cov-2 infection and should not be used as the sole basis for treatment or other patient management decisions. A negative result may occur with  improper specimen collection/handling, submission of specimen other than nasopharyngeal swab, presence of viral mutation(s) within the areas targeted by this assay, and inadequate number of viral copies (<131 copies/mL). A negative result must be combined with clinical observations, patient history, and epidemiological information. The expected result is Negative.  Fact Sheet for Patients:  PinkCheek.be  Fact Sheet for Healthcare Providers:  GravelBags.it  This test is no t yet approved or cleared by the [place] FDA and  has been authorized for detection and/or diagnosis of SARS-CoV-2 by FDA under an Emergency Use Authorization (EUA). This EUA will remain  in effect (meaning this test can be used) for the duration of the COVID-19 declaration under Section 564(b)(1) of the Act, 21 U.S.C. section 360bbb-3(b)(1), unless the authorization is terminated or revoked sooner.     Influenza A by PCR NEGATIVE NEGATIVE Final   Influenza B by PCR NEGATIVE NEGATIVE Final    Comment: (NOTE) The Xpert Xpress SARS-CoV-2/FLU/RSV assay is intended as an aid in  the diagnosis of influenza from Nasopharyngeal swab specimens and  should not be used as a sole basis for treatment. Nasal washings and  aspirates are unacceptable for Xpert Xpress SARS-CoV-2/FLU/RSV  testing.  Fact Sheet for Patients: PinkCheek.be  Fact Sheet for Healthcare Providers: GravelBags.it  This test is not yet approved or cleared by the [place] FDA and  has been  authorized for detection and/or diagnosis of SARS-CoV-2 by  FDA under an Emergency Use Authorization (EUA). This EUA will remain  in effect (meaning this test can be used) for the duration of the  Covid-19 declaration under Section 564(b)(1) of the Act, 21  U.S.C. section 360bbb-3(b)(1), unless the authorization is  terminated or revoked.    Respiratory Syncytial Virus by PCR NEGATIVE NEGATIVE Final    Comment: (NOTE) Fact Sheet for Patients: PinkCheek.be  Fact Sheet for Healthcare Providers: GravelBags.it  This test is not yet approved or cleared by the [place] FDA and  has been authorized for detection and/or diagnosis of SARS-CoV-2 by  FDA under an Emergency Use Authorization (EUA). This EUA will remain  in effect (meaning this test can be used) for the duration of the  COVID-19 declaration under Section 564(b)(1) of the Act, 21 U.S.C.  section 360bbb-3(b)(1), unless the authorization is terminated or  revoked. Performed at [facility], [place] [address] [place], [place] [address]   Culture, blood (routine x 2)     Status: None   Collection Time: 05/10/20  1:44 AM   Specimen: BLOOD  Result Value Ref Range Status   Specimen Description BLOOD SITE NOT SPECIFIED  Final   Special Requests   Final    BOTTLES DRAWN AEROBIC AND ANAEROBIC Blood Culture results may not be optimal due to an excessive volume of blood received in culture bottles   Culture   Final    NO GROWTH 5 DAYS Performed at [facility], [place] [address] [place], [place] [address]    Report Status 05/15/2020 FINAL  Final  Surgical pcr screen     Status: None   Collection Time: 05/10/20  8:48 PM   Specimen: Nasal Mucosa; Nasal Swab  Result Value Ref Range Status   MRSA, PCR NEGATIVE NEGATIVE Final   Staphylococcus aureus NEGATIVE NEGATIVE Final    Comment: (NOTE) The Xpert SA Assay (FDA approved for NASAL specimens in patients 91 years of  age and older), is one component of a comprehensive surveillance program. It is not intended to diagnose infection nor to guide or monitor treatment. Performed at [facility], [place] [address] [place], [place] [address]   Aerobic/Anaerobic Culture (surgical/deep wound)     Status: None   Collection Time: 05/11/20  9:55 AM   Specimen: PATH Cytology Pleural fluid; Body Fluid  Result Value Ref Range Status   Specimen Description FLUID PLEURAL RIGHT EMPYEMA  Final   Special Requests A  Final   Gram Stain   Final    WBC PRESENT,BOTH PMN AND MONONUCLEAR NO ORGANISMS SEEN    Culture   Final    No growth aerobically or anaerobically. Performed at [facility], [place] [address], [place], [place] [address]    Report Status 05/16/2020 FINAL  Final  Aerobic/Anaerobic Culture (surgical/deep wound)     Status: None   Collection Time: 05/11/20  9:58 AM   Specimen: PATH Soft tissue  Result Value Ref Range Status   Specimen Description TISSUE RIGHT EMPYEMA  Final   Special Requests B  Final   Gram Stain   Final    FEW WBC PRESENT,BOTH PMN AND MONONUCLEAR NO ORGANISMS SEEN    Culture   Final    No growth aerobically or anaerobically. Performed at [facility], [place] [address], [place], [place] [address]    Report Status 05/16/2020 FINAL  Final  Aerobic/Anaerobic Culture (surgical/deep wound)  Status: None   Collection Time: 05/11/20 10:00 AM   Specimen: PATH Cytology Pleural fluid; Body Fluid  Result Value Ref Range Status   Specimen Description FLUID CYTO RIGHT EMPYEMA  Final   Special Requests FLUID C  Final   Gram Stain   Final    ABUNDANT WBC PRESENT,BOTH PMN AND MONONUCLEAR NO ORGANISMS SEEN    Culture   Final    No growth aerobically or anaerobically. Performed at [facility], [place] [address], [place], [place] [address]    Report Status 05/16/2020 FINAL  Final  Aerobic/Anaerobic Culture (surgical/deep wound)     Status: None   Collection Time: 05/11/20  10:01 AM   Specimen: PATH Soft tissue resection  Result Value Ref Range Status   Specimen Description TISSUE RIGHT EMPYEMA  Final   Special Requests DIALYSIS  Final   Gram Stain   Final    MODERATE WBC PRESENT,BOTH PMN AND MONONUCLEAR NO ORGANISMS SEEN    Culture   Final    No growth aerobically or anaerobically. Performed at [facility], [place] [address], [place], [place] [address]    Report Status 05/16/2020 FINAL  Final    BNP (last 3 results) Recent Labs    03/28/20 0830 03/29/20 0033 03/30/20 1054  BNP 67.4 50.5 70.6     Studies:  No results found.     Oswald Hillock   [facility] If 7PM-7AM, please contact night-coverage at [email or web address], Office  [phone]   05/17/2020, 11:40 AM  LOS: 8 days

## 2020-05-17 NOTE — Brief Op Note (Signed)
05/17/2020  1:27 PM  PATIENT:  Fayne Mediate  79 y.o. male  PRE-OPERATIVE DIAGNOSIS:  empyema  POST-OPERATIVE DIAGNOSIS:  empyema  PROCEDURE:  Procedure(s): WOUND VAC CHANGE (Right)  SURGEON:  Surgeon(s) and Role:    * [name], MD - Primary  PHYSICIAN ASSISTANT:   ASSISTANTS: none   ANESTHESIA:   IV sedation  EBL:  15 mL   BLOOD ADMINISTERED:none  DRAINS: VAC right chest   LOCAL MEDICATIONS USED:  NONE  SPECIMEN:  No Specimen  DISPOSITION OF SPECIMEN:  N/A  COUNTS:  YES  TOURNIQUET:  * No tourniquets in log *  DICTATION: .Other Dictation: Dictation Number -  PLAN OF CARE: return to inpatient  PATIENT DISPOSITION:  PACU - hemodynamically stable.   Delay start of Pharmacological VTE agent (>24hrs) due to surgical blood loss or risk of bleeding: no

## 2020-05-17 NOTE — Progress Notes (Signed)
PT Cancellation Note  Patient Details Name: Mathew Hall MRN: [id number] DOB: Apr 06, 1941   Cancelled Treatment:    Reason Eval/Treat Not Completed: (P) Patient at procedure or test/unavailable Pt is off the floor for a Wound Vac change. PT will follow back for treatment as able.  Cristo Ausburn B. Migdalia Dk PT, DPT Acute Rehabilitation Services Pager [phone] Office [phone]    [place] 05/17/2020, 12:25 PM

## 2020-05-17 NOTE — Interval H&P Note (Signed)
History and Physical Interval Note:  05/17/2020 12:12 PM  Mathew Hall  has presented today for surgery, with the diagnosis of empyema.  The various methods of treatment have been discussed with the patient and family. After consideration of risks, benefits and other options for treatment, the patient has consented to  Procedure(s): WOUND VAC CHANGE (Right) as a surgical intervention.  The patient's history has been reviewed, patient examined, no change in status, stable for surgery.  I have reviewed the patient's chart and labs.  Questions were answered to the patient's satisfaction.     [name]

## 2020-05-18 ENCOUNTER — Encounter (HOSPITAL_COMMUNITY): Payer: Self-pay | Admitting: Thoracic Surgery (Cardiothoracic Vascular Surgery)

## 2020-05-18 DIAGNOSIS — J869 Pyothorax without fistula: Secondary | ICD-10-CM | POA: Diagnosis not present

## 2020-05-18 DIAGNOSIS — E43 Unspecified severe protein-calorie malnutrition: Secondary | ICD-10-CM | POA: Diagnosis not present

## 2020-05-18 NOTE — Progress Notes (Signed)
Physical Therapy Treatment Patient Details Name: Mathew Hall MRN: [id number] DOB: [birth year] Today's Date: 05/18/2020    History of Present Illness (P) Past medcal history of recent COVID-19 infection with empyema treated with antibiotics, HTN, GERD, CAD SP PCI. Presents with complaints of worsening back pain. Found to have recurrent empyema and failed outpatient treatment.11/5 thoracotomy with empyema drainage and rib resection as well as application of wound VAC.      PT Comments    Pt fully participated in session. Pt demonstrating improved gait during ambulation; cueing needed for safety with turns with RW; pt would benefit from ambulation and balance activities without RW to progress balance and gait; pt will benefit from skilled PT to address deficits in balance, gait and endurance to maximize independence and return to PLOF    Follow Up Recommendations  No PT follow up;Supervision/Assistance - 24 hour     Equipment Recommendations  None recommended by PT    Recommendations for Other Services       Precautions / Restrictions Precautions Precautions: Fall Precaution Comments: wound VAC Restrictions Weight Bearing Restrictions: No    Mobility  Bed Mobility Overal bed mobility: Independent                Transfers Overall transfer level: Modified independent Equipment used: Rolling walker (2 wheeled)                Ambulation/Gait Ambulation/Gait assistance: Supervision Gait Distance (Feet): 550 Feet Assistive device: Rolling walker (2 wheeled) Gait Pattern/deviations: Step-through pattern     General Gait Details: verbal cueing needed with turns with RW for safety   Stairs             Wheelchair Mobility    Modified Rankin (Stroke Patients Only)       Balance                                            Cognition                                              Exercises      General  Comments General comments (skin integrity, edema, etc.): VSS      Pertinent Vitals/Pain      Home Living                      Prior Function            PT Goals (current goals can now be found in the care plan section) Acute Rehab PT Goals Patient Stated Goal: to go home PT Goal Formulation: With patient Time For Goal Achievement: 05/29/20 Potential to Achieve Goals: Good Progress towards PT goals: Progressing toward goals    Frequency    Min 3X/week      PT Plan Current plan remains appropriate    Co-evaluation              AM-PAC PT "6 Clicks" Mobility   Outcome Measure  Help needed turning from your back to your side while in a flat bed without using bedrails?: None Help needed moving from lying on your back to sitting on the side of a flat bed without using bedrails?: None Help needed moving to and from a  bed to a chair (including a wheelchair)?: None Help needed standing up from a chair using your arms (e.g., wheelchair or bedside chair)?: None Help needed to walk in hospital room?: None Help needed climbing 3-5 steps with a railing? : A Little 6 Click Score: 23    End of Session Equipment Utilized During Treatment: Gait belt Activity Tolerance: Patient tolerated treatment well Patient left: with call bell/phone within reach;in bed Nurse Communication: Mobility status PT Visit Diagnosis: Muscle weakness (generalized) (M62.81)     Time: 0981-1914 PT Time Calculation (min) (ACUTE ONLY): 21 min  Charges:  $Gait Training: 8-22 mins                     Lyanne Co, DPT Acute Rehabilitation Services [phone]   [name] 05/18/2020, 11:57 AM

## 2020-05-18 NOTE — Progress Notes (Signed)
[facility]  PROGRESS NOTE  Mathew Hall WIO:[id number] DOB: March 31, 1941 DOA: 05/09/2020 PCP: [name], MD   Brief HPI:   79 year old male with history of recent COVID-19 infection with empyema treated with antibiotics, hypertension, GERD, CAD s/p PCI presents with complaints of worsening back pain.  He was found to have recurrent empyema and failed outpatient treatment.  Underwent thoracotomy on 05/11/2020 with empyema drainage and rib resection as well as application of wound VAC.    Subjective   Patient seen and examined, underwent second VAC change in OR yesterday.   Assessment/Plan:     1. Empyema necessitates right chest-s/p thoracotomy and drainage of empyema with rib resection as well as application of wound VAC on 05/11/2020.  Patient was initially treated with chest tube and IV antibiotics in September followed by oral antibiotics outpatient.  He presented with worsening chest pain and was found to have persistent subcutaneous mass.  CT showed evidence of persistent empyema.  Patient underwent above procedure.  Continue IV Unasyn.  We will switch to p.o. Augmentin once wound VAC is off.  MRSA PCR is negative.  Wound culture showed no growth . CT surgery is following. Plan to change wound VAC today. 2. 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  He was seen by Dr. Worthy Flank 15 years ago.  S/p neoadjuvant chemotherapy followed by right middle lobectomy and lymph node dissection.  Cytology was negative from pleural fluid in September. 3. Essential hypertension-blood pressures controlled, continue Norvasc.  Beta-blocker was stopped during last admission in September.  Beta-blocker was discontinued as patient was bradycardic during  this admission.  Blood pressure is well controlled. 4. History of CAD-no chest pain.  Beta-blocker stopped.  Aspirin resumed.   5. Severe protein calorie malnutrition-dietitian  consulted. 6. Anemia due to chronic illness-hemoglobin is stable. 7. Severe malnutrition-BMI 22.09 kg/m, continue Ensure Enlive, MVI.      Lab Results  Component Value Date   SARSCOV2NAA NEGATIVE 05/09/2020   SARSCOV2NAA POSITIVE (A) 03/22/2020     Scheduled medications:   . acetaminophen  1,000 mg Oral Q6H   Or  . acetaminophen (TYLENOL) oral liquid 160 mg/5 mL  1,000 mg Oral Q6H  . amLODipine  5 mg Oral Daily  . aspirin  325 mg Oral Daily  . bisacodyl  10 mg Oral Daily  . Chlorhexidine Gluconate Cloth  6 each Topical Daily  . docusate sodium  100 mg Oral BID  . enoxaparin (LOVENOX) injection  40 mg Subcutaneous Daily  . feeding supplement  237 mL Oral BID BM  . metoprolol tartrate  25 mg Oral BID  . multivitamin with minerals  1 tablet Oral Daily  . pantoprazole  40 mg Oral QAC breakfast  . polyethylene glycol  17 g Oral Daily  . potassium chloride SA  20 mEq Oral Daily  . senna-docusate  1 tablet Oral QHS  . Vitamin D (Ergocalciferol)  50,000 Units Oral Weekly         CBG: Recent Labs  Lab 05/11/20 1618 05/12/20 1209 05/17/20 1146  GLUCAP 175* 115* 91    SpO2: 97 %    CBC: Recent Labs  Lab 05/12/20 0107 05/13/20 0829 05/15/20 0029 05/16/20 0029  WBC 7.6 6.1 5.0 7.3  HGB 9.0* 9.4* 9.7* 9.6*  HCT 28.8* 29.7* 31.7* 30.7*  MCV 83.7 84.6 85.9 84.8  PLT 280 285 288 426    Basic Metabolic Panel: Recent Labs  Lab 05/12/20 0107 05/13/20 0829  05/15/20 0029 05/16/20 0029  NA 136 136 135 136  K 4.4 3.9 4.6 4.3  CL 103 102 100 102  CO2 26 25 26 25   GLUCOSE 157* 98 233* 113*  BUN 15 10 20 19   CREATININE 0.73 0.83 0.84 0.85  CALCIUM 9.1 9.0 9.2 9.2     Liver Function Tests: Recent Labs  Lab 05/13/20 0829 05/16/20 0029  AST 17 19  ALT 11 15  ALKPHOS 56 55  BILITOT 0.5 0.7  PROT 6.5 6.7  ALBUMIN 2.5* 2.7*     Antibiotics: Anti-infectives (From admission, onward)   Start     Dose/Rate Route Frequency Ordered Stop   05/14/20 1315   ampicillin-sulbactam (UNASYN) 1.5 g in sodium chloride 0.9 % 100 mL IVPB  Status:  Discontinued        1.5 g 200 mL/hr over 30 Minutes Intravenous To ShortStay Surgical 05/14/20 1310 05/14/20 1542   05/11/20 0600  vancomycin (VANCOCIN) IVPB 1000 mg/200 mL premix        1,000 mg 200 mL/hr over 60 Minutes Intravenous On call to O.R. 05/10/20 1556 05/12/20 0733   05/10/20 1145  ampicillin-sulbactam (UNASYN) 1.5 g in sodium chloride 0.9 % 100 mL IVPB        1.5 g 200 mL/hr over 30 Minutes Intravenous Every 6 hours 05/10/20 1058     05/09/20 1330  cefTRIAXone (ROCEPHIN) 1 g in sodium chloride 0.9 % 100 mL IVPB  Status:  Discontinued        1 g 200 mL/hr over 30 Minutes Intravenous Every 24 hours 05/09/20 1321 05/10/20 1053       DVT prophylaxis: Lovenox  Code Status: Full code  Family Communication: No family at bedside   Consultants:  CT surgery  Procedures:  Thoracotomy and empyema drainage with rib resection and wound VAC application    Objective   Vitals:   05/17/20 2337 05/18/20 0005 05/18/20 0339 05/18/20 0729  BP: 101/71  123/73 133/65  Pulse: 69  68 69  Resp: 17  19 (!) 23  Temp: (!) 97.5 F (36.4 C)  (!) 97.5 F (36.4 C)   TempSrc: Oral  Oral   SpO2: (!) 89% 98% 100% 97%  Weight:   67.2 kg   Height:        Intake/Output Summary (Last 24 hours) at 05/18/2020 0913 Last data filed at 05/18/2020 0339 Gross per 24 hour  Intake 1040 ml  Output 1050 ml  Net -10 ml    11/10 1901 - 11/12 0700 In: 1447.3 [P.O.:240; I.V.:500] Out: 2000 [Urine:1850; Drains:135]  Filed Weights   05/12/20 0620 05/14/20 1313 05/18/20 0339  Weight: 65.9 kg 65.9 kg 67.2 kg    Physical Examination:   General-appears in no acute distress Heart-S1-S2, regular, no murmur auscultated Lungs-clear to auscultation bilaterally, no wheezing or crackles auscultated, wound VAC in place Abdomen-soft, nontender, no organomegaly Extremities-no edema in the lower extremities Neuro-alert,  oriented x3, no focal deficit noted  Status is: Inpatient  Dispo: The patient is from: Home              Anticipated d/c is to: Skilled nursing facility versus home              Anticipated d/c date is: 05/21/2020              Patient currently not medically stable for discharge  Barrier to discharge-ongoing treatment for empyema       Data Reviewed:   Recent Results (from the past 240  hour(s))  Culture, blood (routine x 2)     Status: None   Collection Time: 05/09/20  2:45 PM   Specimen: BLOOD  Result Value Ref Range Status   Specimen Description BLOOD RIGHT ANTECUBITAL  Final   Special Requests   Final    BOTTLES DRAWN AEROBIC AND ANAEROBIC Blood Culture adequate volume   Culture   Final    NO GROWTH 5 DAYS Performed at [facility], [address] [place], [place] [address]    Report Status 05/14/2020 FINAL  Final  Resp Panel by RT PCR (RSV, Flu A&B, Covid) - Nasopharyngeal Swab     Status: None   Collection Time: 05/09/20  2:54 PM   Specimen: Nasopharyngeal Swab  Result Value Ref Range Status   SARS Coronavirus 2 by RT PCR NEGATIVE NEGATIVE Final    Comment: (NOTE) SARS-CoV-2 target nucleic acids are NOT DETECTED.  The SARS-CoV-2 RNA is generally detectable in upper respiratoy specimens during the acute phase of infection. The lowest concentration of SARS-CoV-2 viral copies this assay can detect is 131 copies/mL. A negative result does not preclude SARS-Cov-2 infection and should not be used as the sole basis for treatment or other patient management decisions. A negative result may occur with  improper specimen collection/handling, submission of specimen other than nasopharyngeal swab, presence of viral mutation(s) within the areas targeted by this assay, and inadequate number of viral copies (<131 copies/mL). A negative result must be combined with clinical observations, patient history, and epidemiological information. The expected result is  Negative.  Fact Sheet for Patients:  PinkCheek.be  Fact Sheet for Healthcare Providers:  GravelBags.it  This test is no t yet approved or cleared by the [place] FDA and  has been authorized for detection and/or diagnosis of SARS-CoV-2 by FDA under an Emergency Use Authorization (EUA). This EUA will remain  in effect (meaning this test can be used) for the duration of the COVID-19 declaration under Section 564(b)(1) of the Act, 21 U.S.C. section 360bbb-3(b)(1), unless the authorization is terminated or revoked sooner.     Influenza A by PCR NEGATIVE NEGATIVE Final   Influenza B by PCR NEGATIVE NEGATIVE Final    Comment: (NOTE) The Xpert Xpress SARS-CoV-2/FLU/RSV assay is intended as an aid in  the diagnosis of influenza from Nasopharyngeal swab specimens and  should not be used as a sole basis for treatment. Nasal washings and  aspirates are unacceptable for Xpert Xpress SARS-CoV-2/FLU/RSV  testing.  Fact Sheet for Patients: PinkCheek.be  Fact Sheet for Healthcare Providers: GravelBags.it  This test is not yet approved or cleared by the [place] FDA and  has been authorized for detection and/or diagnosis of SARS-CoV-2 by  FDA under an Emergency Use Authorization (EUA). This EUA will remain  in effect (meaning this test can be used) for the duration of the  Covid-19 declaration under Section 564(b)(1) of the Act, 21  U.S.C. section 360bbb-3(b)(1), unless the authorization is  terminated or revoked.    Respiratory Syncytial Virus by PCR NEGATIVE NEGATIVE Final    Comment: (NOTE) Fact Sheet for Patients: PinkCheek.be  Fact Sheet for Healthcare Providers: GravelBags.it  This test is not yet approved or cleared by the [place] FDA and  has been authorized for detection and/or diagnosis of  SARS-CoV-2 by  FDA under an Emergency Use Authorization (EUA). This EUA will remain  in effect (meaning this test can be used) for the duration of the  COVID-19 declaration under Section 564(b)(1) of the Act,  21 U.S.C.  section 360bbb-3(b)(1), unless the authorization is terminated or  revoked. Performed at [facility], [place] [address] [place], [place] [address]   Culture, blood (routine x 2)     Status: None   Collection Time: 05/10/20  1:44 AM   Specimen: BLOOD  Result Value Ref Range Status   Specimen Description BLOOD SITE NOT SPECIFIED  Final   Special Requests   Final    BOTTLES DRAWN AEROBIC AND ANAEROBIC Blood Culture results may not be optimal due to an excessive volume of blood received in culture bottles   Culture   Final    NO GROWTH 5 DAYS Performed at [facility], [place] [address], [place], [place] [address]    Report Status 05/15/2020 FINAL  Final  Surgical pcr screen     Status: None   Collection Time: 05/10/20  8:48 PM   Specimen: Nasal Mucosa; Nasal Swab  Result Value Ref Range Status   MRSA, PCR NEGATIVE NEGATIVE Final   Staphylococcus aureus NEGATIVE NEGATIVE Final    Comment: (NOTE) The Xpert SA Assay (FDA approved for NASAL specimens in patients 21 years of age and older), is one component of a comprehensive surveillance program. It is not intended to diagnose infection nor to guide or monitor treatment. Performed at [facility], [place] [address] [place], [place] [address]   Aerobic/Anaerobic Culture (surgical/deep wound)     Status: None   Collection Time: 05/11/20  9:55 AM   Specimen: PATH Cytology Pleural fluid; Body Fluid  Result Value Ref Range Status   Specimen Description FLUID PLEURAL RIGHT EMPYEMA  Final   Special Requests A  Final   Gram Stain   Final    WBC PRESENT,BOTH PMN AND MONONUCLEAR NO ORGANISMS SEEN    Culture   Final    No growth aerobically or anaerobically. Performed at [facility], [place] [address], [place], [place] [address]    Report Status 05/16/2020 FINAL  Final  Aerobic/Anaerobic Culture (surgical/deep wound)     Status: None   Collection Time: 05/11/20  9:58 AM   Specimen: PATH Soft tissue  Result Value Ref Range Status   Specimen Description TISSUE RIGHT EMPYEMA  Final   Special Requests B  Final   Gram Stain   Final    FEW WBC PRESENT,BOTH PMN AND MONONUCLEAR NO ORGANISMS SEEN    Culture   Final    No growth aerobically or anaerobically. Performed at [facility], [place] [address], [place], [place] [address]    Report Status 05/16/2020 FINAL  Final  Aerobic/Anaerobic Culture (surgical/deep wound)     Status: None   Collection Time: 05/11/20 10:00 AM   Specimen: PATH Cytology Pleural fluid; Body Fluid  Result Value Ref Range Status   Specimen Description FLUID CYTO RIGHT EMPYEMA  Final   Special Requests FLUID C  Final   Gram Stain   Final    ABUNDANT WBC PRESENT,BOTH PMN AND MONONUCLEAR NO ORGANISMS SEEN    Culture   Final    No growth aerobically or anaerobically. Performed at [facility], [place] [address], [place],  [address]    Report Status 05/16/2020 FINAL  Final  Aerobic/Anaerobic Culture (surgical/deep wound)     Status: None   Collection Time: 05/11/20 10:01 AM   Specimen: PATH Soft tissue resection  Result Value Ref Range Status   Specimen Description TISSUE RIGHT EMPYEMA  Final   Special Requests DIALYSIS  Final   Gram Stain   Final    MODERATE WBC PRESENT,BOTH PMN AND MONONUCLEAR NO ORGANISMS SEEN    Culture   Final    No growth aerobically or anaerobically. Performed at [facility], [place] [address], [place],  [address]    Report Status 05/16/2020 FINAL  Final    BNP (last 3 results) Recent Labs    03/28/20 0830 03/29/20 0033 03/30/20 1054  BNP 67.4 50.5 70.6     Studies:  No results found.     Oswald Hillock   [facility] If 7PM-7AM, please contact night-coverage at [email or web address], Office   [phone]   05/18/2020, 9:13 AM  LOS: 9 days

## 2020-05-18 NOTE — Progress Notes (Signed)
      [address]       [place] [address]             [id number]      2 Days Post-Op Procedure(s) (LRB): WOUND VAC CHANGE (Right)   Subjective:  Had vac changed in OR yesterday with IV sedation.   Awake and alert, says he feels good and pain is well controlled.    Objective: Vital signs in last 24 hours: Temp:  [97.3 F (36.3 C)-98.1 F (36.7 C)] 97.5 F (36.4 C) (11/12 0339) Pulse Rate:  [61-80] 69 (11/12 0729) Cardiac Rhythm: Normal sinus rhythm (11/12 0700) Resp:  [10-25] 23 (11/12 0729) BP: (101-138)/(45-82) 133/65 (11/12 0729) SpO2:  [89 %-100 %] 97 % (11/12 0729) Weight:  [67.2 kg] 67.2 kg (11/12 0339)  Intake/Output from previous day: 11/11 0701 - 11/12 0700 In: 1040 [P.O.:240; I.V.:500; IV Piggyback:300] Out: 1898 [Urine:1350; Drains:60; Blood:15]  General appearance: alert, cooperative and no distress Heart: regular rate and rhythm Lungs: clear to auscultation bilaterally Wound: wound vac in place and functioning appropriately. Thin serosanguinous drainage in the canister (23ml/24 hours)  Lab Results: Recent Labs    05/16/20 0029  WBC 7.3  HGB 9.6*  HCT 30.7*  PLT 322   BMET:  Recent Labs    05/16/20 0029  NA 136  K 4.3  CL 102  CO2 25  GLUCOSE 113*  BUN 19  CREATININE 0.85  CALCIUM 9.2    PT/INR:  No results for input(s): LABPROT, INR in the last 72 hours. ABG    Component Value Date/Time   PHART 7.467 (H) 05/10/2020 1715   HCO3 24.0 05/10/2020 1715   TCO2 25 03/06/2009 0437   ACIDBASEDEF 0.5 03/01/2009 0922   O2SAT 97.4 05/10/2020 1715   CBG (last 3)  Recent Labs    05/17/20 1146  GLUCAP 91    Assessment/Plan: S/P Procedure(s) (LRB): WOUND VAC CHANGE (Right)  1. S/P Right Thoracotomy with rib resection, drainage of empyema.Marland Kitchen wound vac in place with appropriate function. Second vac change in OR yesterday. Hope to eventually transition to bedside vac changes.  2. ID-afebrile, no leukocytosis. OR culture  shows no growth at 3 days, on IV ABX 3. Dispo- patient stable, continue ABX, vac change in OR later today.   LOS: 9 days   Mathew Hall, [place]  [phone] 05/18/2020

## 2020-05-18 NOTE — Op Note (Signed)
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  PROCEDURE:  VAC change under sedation.  SURGEON:  Modesto Charon, MD  ASSISTANT:  None.  ANESTHESIA:  Intravenous sedation.  FINDINGS:  Chest wall wound granulating, 10 cm long, 4 cm wide, 3 cm deep, chest cavity approximately 7 cm in diameter with early granulation.  CLINICAL NOTE:  The patient is a 79 year old gentleman with an empyema necessitans, requiring incision and drainage and VAC placement.  He now needs a VAC sponge change.  The indications, risks, benefits, and alternatives were discussed in detail with  the patient.  He understood and accepted the risks and agreed to proceed.  OPERATIVE NOTE:  The patient was brought to the operating room on 05/17/2020.  He was given intravenous sedation and monitored by the anesthesia service.  He was placed in a left lateral decubitus position.  The superficial VAC sponge was removed.  The  surrounding skin was prepped and draped in the usual sterile fashion.  The patient was already on intravenous antibiotics.  A timeout was performed.  The intrapleural VAC sponges were removed.  The cavity appeared slightly smaller than it had previously.  There was granulation internally.  The chest wall opening is healing nicely and granulating over 100% of the surface area  with no necrotic tissue.  The wound was copiously irrigated with warm saline.  A white VAC sponge was cut in half and the 2 halves were placed into the pleural space.  A black VAC sponge was cut to fit the chest wall defect.  The VAC was applied and turned   on at 75 mmHg suction.  The patient then was taken to the postanesthetic care unit in good condition.  Counts were correct.  HN/NUANCE  D:05/17/2020 T:05/18/2020 JOB:[id number]

## 2020-05-19 DIAGNOSIS — R001 Bradycardia, unspecified: Secondary | ICD-10-CM | POA: Diagnosis not present

## 2020-05-19 DIAGNOSIS — J869 Pyothorax without fistula: Secondary | ICD-10-CM | POA: Diagnosis not present

## 2020-05-19 DIAGNOSIS — E43 Unspecified severe protein-calorie malnutrition: Secondary | ICD-10-CM | POA: Diagnosis not present

## 2020-05-19 LAB — BASIC METABOLIC PANEL
Anion gap: 7 (ref 5–15)
BUN: 17 mg/dL (ref 8–23)
CO2: 27 mmol/L (ref 22–32)
Calcium: 9.2 mg/dL (ref 8.9–10.3)
Chloride: 101 mmol/L (ref 98–111)
Creatinine, Ser: 0.84 mg/dL (ref 0.61–1.24)
GFR, Estimated: >60 mL/min (ref >60)
Glucose, Bld: 110 mg/dL — ABNORMAL HIGH (ref 70–99)
Potassium: 4 mmol/L (ref 3.5–5.1)
Sodium: 135 mmol/L (ref 135–145)

## 2020-05-19 LAB — CBC
HCT: 31.5 % — ABNORMAL LOW (ref 39.0–52.0)
Hemoglobin: 10 g/dL — ABNORMAL LOW (ref 13.0–17.0)
MCH: 27.1 pg (ref 26.0–34.0)
MCHC: 31.7 g/dL (ref 30.0–36.0)
MCV: 85.4 fL (ref 80.0–100.0)
Platelets: 323 10*3/uL (ref 150–400)
RBC: 3.69 MIL/uL — ABNORMAL LOW (ref 4.22–5.81)
RDW: 16.8 % — ABNORMAL HIGH (ref 11.5–15.5)
WBC: 5.2 10*3/uL (ref 4.0–10.5)
nRBC: 0 % (ref 0.0–0.2)

## 2020-05-19 MED ORDER — METOPROLOL TARTRATE 12.5 MG HALF TABLET: 12.5000 mg | ORAL_TABLET | Freq: Two times a day (BID) | ORAL | Status: DC

## 2020-05-19 MED ORDER — AMLODIPINE BESYLATE 10 MG PO TABS
10.0000 mg | ORAL_TABLET | Freq: Every day | ORAL | Status: DC
  Administered 2020-05-19 – 2020-05-28 (×10): 10 mg via ORAL
  Filled 2020-05-19 (×10): qty 1

## 2020-05-19 NOTE — Progress Notes (Signed)
2 Days Post-Op Procedure(s) (LRB): WOUND VAC CHANGE (Right) Subjective: occasional mild discomfort at [facility] site, not requiring any pain med  Objective: Vital signs in last 24 hours: Temp:  [97.6 F (36.4 C)-97.8 F (36.6 C)] 97.6 F (36.4 C) (11/13 0729) Pulse Rate:  [52-62] 52 (11/13 0328) Cardiac Rhythm: Normal sinus rhythm (11/13 0729) Resp:  [15-23] 19 (11/13 0328) BP: (118-136)/(54-72) 124/54 (11/13 0328) SpO2:  [93 %-99 %] 93 % (11/13 0328)  Hemodynamic parameters for last 24 hours:    Intake/Output from previous day: 11/12 0701 - 11/13 0700 In: -  Out: 2175 [Urine:2175] Intake/Output this shift: Total I/O In: -  Out: 100 [Urine:100]  General appearance: alert, cooperative and no distress Neurologic: intact Heart: regular rate and rhythm Lungs: diminished breath sounds right base VAC in place  Lab Results: Recent Labs    05/19/20 0107  WBC 5.2  HGB 10.0*  HCT 31.5*  PLT 323   BMET:  Recent Labs    05/19/20 0107  NA 135  K 4.0  CL 101  CO2 27  GLUCOSE 110*  BUN 17  CREATININE 0.84  CALCIUM 9.2    PT/INR: No results for input(s): LABPROT, INR in the last 72 hours. ABG    Component Value Date/Time   PHART 7.467 (H) 05/10/2020 1715   HCO3 24.0 05/10/2020 1715   TCO2 25 03/06/2009 0437   ACIDBASEDEF 0.5 03/01/2009 0922   O2SAT 97.4 05/10/2020 1715   CBG (last 3)  Recent Labs    05/17/20 1146  GLUCAP 91    Assessment/Plan: S/P Procedure(s) (LRB): WOUND VAC CHANGE (Right) -Doing well Will change VAC in OR again early next week, will try with lighter sedation and see if we can transition to doing at bedside Continue Unasyn   LOS: 10 days    [name] 05/19/2020

## 2020-05-19 NOTE — Progress Notes (Signed)
[facility]  PROGRESS NOTE  AMARI ZAGAL OIN:[id number] DOB: 1940-08-30 DOA: 05/09/2020 PCP: [name], MD   Brief HPI:   79 year old male with history of recent COVID-19 infection with empyema treated with antibiotics, hypertension, GERD, CAD s/p PCI presents with complaints of worsening back pain.  He was found to have recurrent empyema and failed outpatient treatment.  Underwent thoracotomy on 05/11/2020 with empyema drainage and rib resection as well as application of wound VAC.   Subjective   Patient seen and examined, denies any complaints.  S/p wound VAC change per CT surgery.   Assessment/Plan:    1. Empyema necessitates right chest-s/p thoracotomy and drainage of empyema with rib resection as well as application of wound VAC on 05/11/2020.  Patient was initially treated with chest tube and IV antibiotics in September followed by oral antibiotics outpatient.  He presented with worsening chest pain and was found to have persistent subcutaneous mass.  CT showed evidence of persistent empyema.  Patient underwent above procedure.  Continue IV Unasyn till [facility] dressing is removed.  We will switch to p.o. Augmentin for another 5 days once VAC dressing is off.  MRSA PCR is negative.  Wound culture showed no growth . CT surgery is following. Plan to change wound VAC in OR early next week as per CT surgery. 2. 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  He was seen by Dr. Worthy Flank 15 years ago.  S/p neoadjuvant chemotherapy followed by right middle lobectomy and lymph node dissection.  Cytology was negative from pleural fluid in September. 3. Essential hypertension-blood pressures controlled, continue Norvasc.  Beta-blocker was stopped during last admission in September.  Beta-blocker was discontinued as patient was bradycardic during  this admission.  Blood pressure is well controlled. 4. History of CAD-no chest pain.   Beta-blocker stopped.  Aspirin resumed.   5. Severe protein calorie malnutrition-dietitian consulted. 6. Anemia due to chronic illness-hemoglobin is stable. 7. Severe malnutrition-BMI 22.09 kg/m, continue Ensure Enlive, MVI.      Lab Results  Component Value Date   SARSCOV2NAA NEGATIVE 05/09/2020   SARSCOV2NAA POSITIVE (A) 03/22/2020     Scheduled medications:   . acetaminophen  1,000 mg Oral Q6H   Or  . acetaminophen (TYLENOL) oral liquid 160 mg/5 mL  1,000 mg Oral Q6H  . amLODipine  10 mg Oral Daily  . aspirin  325 mg Oral Daily  . bisacodyl  10 mg Oral Daily  . Chlorhexidine Gluconate Cloth  6 each Topical Daily  . docusate sodium  100 mg Oral BID  . enoxaparin (LOVENOX) injection  40 mg Subcutaneous Daily  . feeding supplement  237 mL Oral BID BM  . multivitamin with minerals  1 tablet Oral Daily  . pantoprazole  40 mg Oral QAC breakfast  . polyethylene glycol  17 g Oral Daily  . potassium chloride SA  20 mEq Oral Daily  . senna-docusate  1 tablet Oral QHS  . Vitamin D (Ergocalciferol)  50,000 Units Oral Weekly         CBG: Recent Labs  Lab 05/17/20 1146  GLUCAP 91    SpO2: 93 %    CBC: Recent Labs  Lab 05/13/20 0829 05/15/20 0029 05/16/20 0029 05/19/20 0107  WBC 6.1 5.0 7.3 5.2  HGB 9.4* 9.7* 9.6* 10.0*  HCT 29.7* 31.7* 30.7* 31.5*  MCV 84.6 85.9 84.8 85.4  PLT 285 288 322 709    Basic Metabolic Panel: Recent Labs  Lab  05/13/20 0829 05/15/20 0029 05/16/20 0029 05/19/20 0107  NA 136 135 136 135  K 3.9 4.6 4.3 4.0  CL 102 100 102 101  CO2 25 26 25 27   GLUCOSE 98 233* 113* 110*  BUN 10 20 19 17   CREATININE 0.83 0.84 0.85 0.84  CALCIUM 9.0 9.2 9.2 9.2     Liver Function Tests: Recent Labs  Lab 05/13/20 0829 05/16/20 0029  AST 17 19  ALT 11 15  ALKPHOS 56 55  BILITOT 0.5 0.7  PROT 6.5 6.7  ALBUMIN 2.5* 2.7*     Antibiotics: Anti-infectives (From admission, onward)   Start     Dose/Rate Route Frequency Ordered Stop    05/14/20 1315  ampicillin-sulbactam (UNASYN) 1.5 g in sodium chloride 0.9 % 100 mL IVPB  Status:  Discontinued        1.5 g 200 mL/hr over 30 Minutes Intravenous To ShortStay Surgical 05/14/20 1310 05/14/20 1542   05/11/20 0600  vancomycin (VANCOCIN) IVPB 1000 mg/200 mL premix        1,000 mg 200 mL/hr over 60 Minutes Intravenous On call to O.R. 05/10/20 1556 05/12/20 0733   05/10/20 1145  ampicillin-sulbactam (UNASYN) 1.5 g in sodium chloride 0.9 % 100 mL IVPB        1.5 g 200 mL/hr over 30 Minutes Intravenous Every 6 hours 05/10/20 1058     05/09/20 1330  cefTRIAXone (ROCEPHIN) 1 g in sodium chloride 0.9 % 100 mL IVPB  Status:  Discontinued        1 g 200 mL/hr over 30 Minutes Intravenous Every 24 hours 05/09/20 1321 05/10/20 1053       DVT prophylaxis: Lovenox  Code Status: Full code  Family Communication: No family at bedside   Consultants:  CT surgery  Procedures:  Thoracotomy and empyema drainage with rib resection and wound VAC application    Objective   Vitals:   05/18/20 2000 05/18/20 2356 05/19/20 0328 05/19/20 0729  BP:  128/62 (!) 124/54   Pulse:  62 (!) 52   Resp: 17 16 19    Temp:  97.6 F (36.4 C) 97.8 F (36.6 C) 97.6 F (36.4 C)  TempSrc:  Oral Oral Oral  SpO2:  99% 93%   Weight:      Height:        Intake/Output Summary (Last 24 hours) at 05/19/2020 1253 Last data filed at 05/19/2020 0730 Gross per 24 hour  Intake --  Output 1725 ml  Net -1725 ml    11/11 1901 - 11/13 0700 In: 520 [P.O.:120] Out: 2985 [Urine:2925; Drains:60]  Filed Weights   05/12/20 0620 05/14/20 1313 05/18/20 0339  Weight: 65.9 kg 65.9 kg 67.2 kg    Physical Examination:   General-appears in no acute distress Heart-S1-S2, regular, no murmur auscultated Lungs-clear to auscultation bilaterally, no wheezing or crackles auscultated, wound VAC in place Abdomen-soft, nontender, no organomegaly Extremities-no edema in the lower extremities Neuro-alert, oriented  x3, no focal deficit noted  Status is: Inpatient  Dispo: The patient is from: Home              Anticipated d/c is to: Skilled nursing facility versus home              Anticipated d/c date is: 05/21/2020              Patient currently not medically stable for discharge  Barrier to discharge-ongoing treatment for empyema      Data Reviewed:   Recent Results (from the past  240 hour(s))  Culture, blood (routine x 2)     Status: None   Collection Time: 05/09/20  2:45 PM   Specimen: BLOOD  Result Value Ref Range Status   Specimen Description BLOOD RIGHT ANTECUBITAL  Final   Special Requests   Final    BOTTLES DRAWN AEROBIC AND ANAEROBIC Blood Culture adequate volume   Culture   Final    NO GROWTH 5 DAYS Performed at [facility], [address] [place], [place] [address]    Report Status 05/14/2020 FINAL  Final  Resp Panel by RT PCR (RSV, Flu A&B, Covid) - Nasopharyngeal Swab     Status: None   Collection Time: 05/09/20  2:54 PM   Specimen: Nasopharyngeal Swab  Result Value Ref Range Status   SARS Coronavirus 2 by RT PCR NEGATIVE NEGATIVE Final    Comment: (NOTE) SARS-CoV-2 target nucleic acids are NOT DETECTED.  The SARS-CoV-2 RNA is generally detectable in upper respiratoy specimens during the acute phase of infection. The lowest concentration of SARS-CoV-2 viral copies this assay can detect is 131 copies/mL. A negative result does not preclude SARS-Cov-2 infection and should not be used as the sole basis for treatment or other patient management decisions. A negative result may occur with  improper specimen collection/handling, submission of specimen other than nasopharyngeal swab, presence of viral mutation(s) within the areas targeted by this assay, and inadequate number of viral copies (<131 copies/mL). A negative result must be combined with clinical observations, patient history, and epidemiological information. The expected result is Negative.  Fact Sheet  for Patients:  PinkCheek.be  Fact Sheet for Healthcare Providers:  GravelBags.it  This test is no t yet approved or cleared by the [place] FDA and  has been authorized for detection and/or diagnosis of SARS-CoV-2 by FDA under an Emergency Use Authorization (EUA). This EUA will remain  in effect (meaning this test can be used) for the duration of the COVID-19 declaration under Section 564(b)(1) of the Act, 21 U.S.C. section 360bbb-3(b)(1), unless the authorization is terminated or revoked sooner.     Influenza A by PCR NEGATIVE NEGATIVE Final   Influenza B by PCR NEGATIVE NEGATIVE Final    Comment: (NOTE) The Xpert Xpress SARS-CoV-2/FLU/RSV assay is intended as an aid in  the diagnosis of influenza from Nasopharyngeal swab specimens and  should not be used as a sole basis for treatment. Nasal washings and  aspirates are unacceptable for Xpert Xpress SARS-CoV-2/FLU/RSV  testing.  Fact Sheet for Patients: PinkCheek.be  Fact Sheet for Healthcare Providers: GravelBags.it  This test is not yet approved or cleared by the [place] FDA and  has been authorized for detection and/or diagnosis of SARS-CoV-2 by  FDA under an Emergency Use Authorization (EUA). This EUA will remain  in effect (meaning this test can be used) for the duration of the  Covid-19 declaration under Section 564(b)(1) of the Act, 21  U.S.C. section 360bbb-3(b)(1), unless the authorization is  terminated or revoked.    Respiratory Syncytial Virus by PCR NEGATIVE NEGATIVE Final    Comment: (NOTE) Fact Sheet for Patients: PinkCheek.be  Fact Sheet for Healthcare Providers: GravelBags.it  This test is not yet approved or cleared by the [place] FDA and  has been authorized for detection and/or diagnosis of SARS-CoV-2 by  FDA under  an Emergency Use Authorization (EUA). This EUA will remain  in effect (meaning this test can be used) for the duration of the  COVID-19 declaration under Section 564(b)(1) of the  Act, 21 U.S.C.  section 360bbb-3(b)(1), unless the authorization is terminated or  revoked. Performed at [facility], [place] [address] [place], [place] [address]   Culture, blood (routine x 2)     Status: None   Collection Time: 05/10/20  1:44 AM   Specimen: BLOOD  Result Value Ref Range Status   Specimen Description BLOOD SITE NOT SPECIFIED  Final   Special Requests   Final    BOTTLES DRAWN AEROBIC AND ANAEROBIC Blood Culture results may not be optimal due to an excessive volume of blood received in culture bottles   Culture   Final    NO GROWTH 5 DAYS Performed at [facility], [place] [address] [place], [place] [address]    Report Status 05/15/2020 FINAL  Final  Surgical pcr screen     Status: None   Collection Time: 05/10/20  8:48 PM   Specimen: Nasal Mucosa; Nasal Swab  Result Value Ref Range Status   MRSA, PCR NEGATIVE NEGATIVE Final   Staphylococcus aureus NEGATIVE NEGATIVE Final    Comment: (NOTE) The Xpert SA Assay (FDA approved for NASAL specimens in patients 65 years of age and older), is one component of a comprehensive surveillance program. It is not intended to diagnose infection nor to guide or monitor treatment. Performed at [facility], [place] [address], [place], [place] [address]   Aerobic/Anaerobic Culture (surgical/deep wound)     Status: None   Collection Time: 05/11/20  9:55 AM   Specimen: PATH Cytology Pleural fluid; Body Fluid  Result Value Ref Range Status   Specimen Description FLUID PLEURAL RIGHT EMPYEMA  Final   Special Requests A  Final   Gram Stain   Final    WBC PRESENT,BOTH PMN AND MONONUCLEAR NO ORGANISMS SEEN    Culture   Final    No growth aerobically or anaerobically. Performed at [facility], [place] [address], [place], [place] [address]     Report Status 05/16/2020 FINAL  Final  Aerobic/Anaerobic Culture (surgical/deep wound)     Status: None   Collection Time: 05/11/20  9:58 AM   Specimen: PATH Soft tissue  Result Value Ref Range Status   Specimen Description TISSUE RIGHT EMPYEMA  Final   Special Requests B  Final   Gram Stain   Final    FEW WBC PRESENT,BOTH PMN AND MONONUCLEAR NO ORGANISMS SEEN    Culture   Final    No growth aerobically or anaerobically. Performed at [facility], [place] [address], [place], [place] [address]    Report Status 05/16/2020 FINAL  Final  Aerobic/Anaerobic Culture (surgical/deep wound)     Status: None   Collection Time: 05/11/20 10:00 AM   Specimen: PATH Cytology Pleural fluid; Body Fluid  Result Value Ref Range Status   Specimen Description FLUID CYTO RIGHT EMPYEMA  Final   Special Requests FLUID C  Final   Gram Stain   Final    ABUNDANT WBC PRESENT,BOTH PMN AND MONONUCLEAR NO ORGANISMS SEEN    Culture   Final    No growth aerobically or anaerobically. Performed at [facility], [place] [address], [place], [place] [address]    Report Status 05/16/2020 FINAL  Final  Aerobic/Anaerobic Culture (surgical/deep wound)     Status: None   Collection Time: 05/11/20 10:01 AM   Specimen: PATH Soft tissue resection  Result Value Ref Range Status   Specimen Description TISSUE RIGHT EMPYEMA  Final   Special Requests DIALYSIS  Final   Gram Stain   Final    MODERATE WBC PRESENT,BOTH PMN AND MONONUCLEAR NO ORGANISMS SEEN    Culture   Final    No growth aerobically or anaerobically. Performed at [facility], [place] [employer] [address] [place], [place] [address]    Report Status 05/16/2020 FINAL  Final    BNP (last 3 results) Recent Labs    03/28/20 0830 03/29/20 0033 03/30/20 1054  BNP 67.4 50.5 70.6      [place]   [facility] If 7PM-7AM, please contact night-coverage at [email or web address], Office  [phone]   05/19/2020, 12:53 PM  LOS: 10 days

## 2020-05-20 DIAGNOSIS — R001 Bradycardia, unspecified: Secondary | ICD-10-CM | POA: Diagnosis not present

## 2020-05-20 DIAGNOSIS — E43 Unspecified severe protein-calorie malnutrition: Secondary | ICD-10-CM | POA: Diagnosis not present

## 2020-05-20 DIAGNOSIS — J869 Pyothorax without fistula: Secondary | ICD-10-CM | POA: Diagnosis not present

## 2020-05-20 NOTE — Progress Notes (Signed)
Ambulated along the hallway about 350 feet, tolerated well. Still complaining  Of constipation despite laxatives and stool softener given this am.  Warm prune juice given. Continue to monitor.

## 2020-05-20 NOTE — H&P (View-Only) (Signed)
3 Days Post-Op Procedure(s) (LRB): WOUND VAC CHANGE (Right) Subjective: C/o constipation  Objective: Vital signs in last 24 hours: Temp:  [97.5 F (36.4 C)-98.1 F (36.7 C)] 98.1 F (36.7 C) (11/14 0832) Pulse Rate:  [62-78] 78 (11/14 0832) Cardiac Rhythm: Normal sinus rhythm (11/14 0754) Resp:  [16-22] 19 (11/14 0832) BP: (125-131)/(66-85) 128/73 (11/14 0832) SpO2:  [97 %-100 %] 98 % (11/14 0832)  Hemodynamic parameters for last 24 hours:    Intake/Output from previous day: 11/13 0701 - 11/14 0700 In: 120 [P.O.:120] Out: 1085 [Urine:950; Drains:135] Intake/Output this shift: Total I/O In: -  Out: 100 [Drains:100]  General appearance: alert, cooperative and no distress Neurologic: intact Heart: regular rate and rhythm Lungs: diminished breath sounds right base Abdomen: normal findings: soft, non-tender Wound: VAC in place  Lab Results: Recent Labs    05/19/20 0107  WBC 5.2  HGB 10.0*  HCT 31.5*  PLT 323   BMET:  Recent Labs    05/19/20 0107  NA 135  K 4.0  CL 101  CO2 27  GLUCOSE 110*  BUN 17  CREATININE 0.84  CALCIUM 9.2    PT/INR: No results for input(s): LABPROT, INR in the last 72 hours. ABG    Component Value Date/Time   PHART 7.467 (H) 05/10/2020 1715   HCO3 24.0 05/10/2020 1715   TCO2 25 03/06/2009 0437   ACIDBASEDEF 0.5 03/01/2009 0922   O2SAT 97.4 05/10/2020 1715   CBG (last 3)  Recent Labs    05/17/20 1146  GLUCAP 91    Assessment/Plan: S/P Procedure(s) (LRB): WOUND VAC CHANGE (Right) -Constipation- has laxatives ordered will plan VAC change in OR tomorrow with lighter sedation and hopefully can transition to doing it at bedside Day 10 IV Unasyn  LOS: 11 days    [name] 05/20/2020

## 2020-05-20 NOTE — Progress Notes (Signed)
3 Days Post-Op Procedure(s) (LRB): WOUND VAC CHANGE (Right) Subjective: C/o constipation  Objective: Vital signs in last 24 hours: Temp:  [97.5 F (36.4 C)-98.1 F (36.7 C)] 98.1 F (36.7 C) (11/14 0832) Pulse Rate:  [62-78] 78 (11/14 0832) Cardiac Rhythm: Normal sinus rhythm (11/14 0754) Resp:  [16-22] 19 (11/14 0832) BP: (125-131)/(66-85) 128/73 (11/14 0832) SpO2:  [97 %-100 %] 98 % (11/14 0832)  Hemodynamic parameters for last 24 hours:    Intake/Output from previous day: 11/13 0701 - 11/14 0700 In: 120 [P.O.:120] Out: 1085 [Urine:950; Drains:135] Intake/Output this shift: Total I/O In: -  Out: 100 [Drains:100]  General appearance: alert, cooperative and no distress Neurologic: intact Heart: regular rate and rhythm Lungs: diminished breath sounds right base Abdomen: normal findings: soft, non-tender Wound: VAC in place  Lab Results: Recent Labs    05/19/20 0107  WBC 5.2  HGB 10.0*  HCT 31.5*  PLT 323   BMET:  Recent Labs    05/19/20 0107  NA 135  K 4.0  CL 101  CO2 27  GLUCOSE 110*  BUN 17  CREATININE 0.84  CALCIUM 9.2    PT/INR: No results for input(s): LABPROT, INR in the last 72 hours. ABG    Component Value Date/Time   PHART 7.467 (H) 05/10/2020 1715   HCO3 24.0 05/10/2020 1715   TCO2 25 03/06/2009 0437   ACIDBASEDEF 0.5 03/01/2009 0922   O2SAT 97.4 05/10/2020 1715   CBG (last 3)  Recent Labs    05/17/20 1146  GLUCAP 91    Assessment/Plan: S/P Procedure(s) (LRB): WOUND VAC CHANGE (Right) -Constipation- has laxatives ordered will plan VAC change in OR tomorrow with lighter sedation and hopefully can transition to doing it at bedside Day 10 IV Unasyn  LOS: 11 days    [name] 05/20/2020

## 2020-05-20 NOTE — Progress Notes (Signed)
[facility]  PROGRESS NOTE  Mathew Hall RFF:[id number] DOB: 1941-06-14 DOA: 05/09/2020 PCP: [name], MD   Brief HPI:   79 year old male with history of recent COVID-19 infection with empyema treated with antibiotics, hypertension, GERD, CAD s/p PCI presents with complaints of worsening back pain.  He was found to have recurrent empyema and failed outpatient treatment.  Underwent thoracotomy on 05/11/2020 with empyema drainage and rib resection as well as application of wound VAC.   Subjective   Patient seen and examined, denies any complaints.  Pain well controlled.   Assessment/Plan:    1. Empyema necessitates right chest-s/p thoracotomy and drainage of empyema with rib resection as well as application of wound VAC on 05/11/2020.  Patient was initially treated with chest tube and IV antibiotics in September followed by oral antibiotics outpatient.  He presented with worsening chest pain and was found to have persistent subcutaneous mass.  CT showed evidence of persistent empyema.  Patient underwent above procedure.  Continue IV Unasyn till [facility] dressing is removed.  We will switch to p.o. Augmentin for another 5 days once VAC dressing is off.  MRSA PCR is negative.  Wound culture showed no growth . CT surgery is following. Plan to change wound VAC in OR early next week as per CT surgery. 2. 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  He was seen by Dr. Worthy Flank 15 years ago.  S/p neoadjuvant chemotherapy followed by right middle lobectomy and lymph node dissection.  Cytology was negative from pleural fluid in September. 3. Essential hypertension-blood pressures controlled, continue Norvasc 10 mg daily.  Beta-blocker was stopped during last admission in September.  Beta-blocker was discontinued as patient was bradycardic during  this admission.  Blood pressure is well controlled. 4. Bradycardia-resolved after stopping  metoprolol.  Blood pressure is well controlled  with Norvasc. 5. History of CAD-no chest pain.  Beta-blocker stopped.  Aspirin resumed.   6. Severe protein calorie malnutrition-dietitian consulted. 7. Anemia due to chronic illness-hemoglobin is stable. 8. Severe malnutrition-BMI 22.09 kg/m, continue Ensure Enlive, MVI.      Lab Results  Component Value Date   SARSCOV2NAA NEGATIVE 05/09/2020   SARSCOV2NAA POSITIVE (A) 03/22/2020     Scheduled medications:   . amLODipine  10 mg Oral Daily  . aspirin  325 mg Oral Daily  . bisacodyl  10 mg Oral Daily  . Chlorhexidine Gluconate Cloth  6 each Topical Daily  . docusate sodium  100 mg Oral BID  . enoxaparin (LOVENOX) injection  40 mg Subcutaneous Daily  . feeding supplement  237 mL Oral BID BM  . multivitamin with minerals  1 tablet Oral Daily  . pantoprazole  40 mg Oral QAC breakfast  . polyethylene glycol  17 g Oral Daily  . potassium chloride SA  20 mEq Oral Daily  . senna-docusate  1 tablet Oral QHS  . Vitamin D (Ergocalciferol)  50,000 Units Oral Weekly         CBG: Recent Labs  Lab 05/17/20 1146  GLUCAP 91    SpO2: 98 %    CBC: Recent Labs  Lab 05/15/20 0029 05/16/20 0029 05/19/20 0107  WBC 5.0 7.3 5.2  HGB 9.7* 9.6* 10.0*  HCT 31.7* 30.7* 31.5*  MCV 85.9 84.8 85.4  PLT 288 322 357    Basic Metabolic Panel: Recent Labs  Lab 05/15/20 0029 05/16/20 0029 05/19/20 0107  NA 135 136 135  K 4.6 4.3 4.0  CL 100  102 101  CO2 26 25 27   GLUCOSE 233* 113* 110*  BUN 20 19 17   CREATININE 0.84 0.85 0.84  CALCIUM 9.2 9.2 9.2     Liver Function Tests: Recent Labs  Lab 05/16/20 0029  AST 19  ALT 15  ALKPHOS 55  BILITOT 0.7  PROT 6.7  ALBUMIN 2.7*     Antibiotics: Anti-infectives (From admission, onward)   Start     Dose/Rate Route Frequency Ordered Stop   05/14/20 1315  ampicillin-sulbactam (UNASYN) 1.5 g in sodium chloride 0.9 % 100 mL IVPB  Status:  Discontinued        1.5 g 200 mL/hr over  30 Minutes Intravenous To ShortStay Surgical 05/14/20 1310 05/14/20 1542   05/11/20 0600  vancomycin (VANCOCIN) IVPB 1000 mg/200 mL premix        1,000 mg 200 mL/hr over 60 Minutes Intravenous On call to O.R. 05/10/20 1556 05/12/20 0733   05/10/20 1145  ampicillin-sulbactam (UNASYN) 1.5 g in sodium chloride 0.9 % 100 mL IVPB        1.5 g 200 mL/hr over 30 Minutes Intravenous Every 6 hours 05/10/20 1058     05/09/20 1330  cefTRIAXone (ROCEPHIN) 1 g in sodium chloride 0.9 % 100 mL IVPB  Status:  Discontinued        1 g 200 mL/hr over 30 Minutes Intravenous Every 24 hours 05/09/20 1321 05/10/20 1053       DVT prophylaxis: Lovenox  Code Status: Full code  Family Communication: No family at bedside   Consultants:  CT surgery  Procedures:  Thoracotomy and empyema drainage with rib resection and wound VAC application    Objective   Vitals:   05/19/20 2000 05/20/20 0009 05/20/20 0323 05/20/20 0832  BP:  130/66 131/85 128/73  Pulse: 65 62 77 78  Resp: (!) 22 19 20 19   Temp:  97.6 F (36.4 C) (!) 97.5 F (36.4 C) 98.1 F (36.7 C)  TempSrc:  Oral Oral Oral  SpO2: 97% 100% 99% 98%  Weight:      Height:        Intake/Output Summary (Last 24 hours) at 05/20/2020 7893 Last data filed at 05/20/2020 0600 Gross per 24 hour  Intake 120 ml  Output 950 ml  Net -830 ml    11/12 1901 - 11/14 0700 In: 120 [P.O.:120] Out: 2235 [Urine:2100; Drains:135]  Filed Weights   05/12/20 0620 05/14/20 1313 05/18/20 0339  Weight: 65.9 kg 65.9 kg 67.2 kg    Physical Examination:   General-appears in no acute distress Heart-S1-S2, regular, no murmur auscultated Lungs-clear to auscultation bilaterally, no wheezing or crackles auscultated Abdomen-soft, nontender, no organomegaly Extremities-no edema in the lower extremities Neuro-alert, oriented x3, no focal deficit noted  Status is: Inpatient  Dispo: The patient is from: Home              Anticipated d/c is to: Skilled nursing  facility versus home              Anticipated d/c date is: 05/21/2020              Patient currently not medically stable for discharge  Barrier to discharge-ongoing treatment for empyema      Data Reviewed:   Recent Results (from the past 240 hour(s))  Surgical pcr screen     Status: None   Collection Time: 05/10/20  8:48 PM   Specimen: Nasal Mucosa; Nasal Swab  Result Value Ref Range Status   MRSA, PCR NEGATIVE NEGATIVE Final  Staphylococcus aureus NEGATIVE NEGATIVE Final    Comment: (NOTE) The Xpert SA Assay (FDA approved for NASAL specimens in patients 52 years of age and older), is one component of a comprehensive surveillance program. It is not intended to diagnose infection nor to guide or monitor treatment. Performed at [facility], [place] [address], [place], [place] [address]   Aerobic/Anaerobic Culture (surgical/deep wound)     Status: None   Collection Time: 05/11/20  9:55 AM   Specimen: PATH Cytology Pleural fluid; Body Fluid  Result Value Ref Range Status   Specimen Description FLUID PLEURAL RIGHT EMPYEMA  Final   Special Requests A  Final   Gram Stain   Final    WBC PRESENT,BOTH PMN AND MONONUCLEAR NO ORGANISMS SEEN    Culture   Final    No growth aerobically or anaerobically. Performed at [facility], [place] [address], [place], [place] [address]    Report Status 05/16/2020 FINAL  Final  Aerobic/Anaerobic Culture (surgical/deep wound)     Status: None   Collection Time: 05/11/20  9:58 AM   Specimen: PATH Soft tissue  Result Value Ref Range Status   Specimen Description TISSUE RIGHT EMPYEMA  Final   Special Requests B  Final   Gram Stain   Final    FEW WBC PRESENT,BOTH PMN AND MONONUCLEAR NO ORGANISMS SEEN    Culture   Final    No growth aerobically or anaerobically. Performed at [facility], [place] [address] [place], [place] [address]    Report Status 05/16/2020 FINAL  Final  Aerobic/Anaerobic Culture (surgical/deep wound)      Status: None   Collection Time: 05/11/20 10:00 AM   Specimen: PATH Cytology Pleural fluid; Body Fluid  Result Value Ref Range Status   Specimen Description FLUID CYTO RIGHT EMPYEMA  Final   Special Requests FLUID C  Final   Gram Stain   Final    ABUNDANT WBC PRESENT,BOTH PMN AND MONONUCLEAR NO ORGANISMS SEEN    Culture   Final    No growth aerobically or anaerobically. Performed at [facility], [place] [address], [place], [place] [address]    Report Status 05/16/2020 FINAL  Final  Aerobic/Anaerobic Culture (surgical/deep wound)     Status: None   Collection Time: 05/11/20 10:01 AM   Specimen: PATH Soft tissue resection  Result Value Ref Range Status   Specimen Description TISSUE RIGHT EMPYEMA  Final   Special Requests DIALYSIS  Final   Gram Stain   Final    MODERATE WBC PRESENT,BOTH PMN AND MONONUCLEAR NO ORGANISMS SEEN    Culture   Final    No growth aerobically or anaerobically. Performed at [facility], [place] [address] [place], [place] [address]    Report Status 05/16/2020 FINAL  Final    BNP (last 3 results) Recent Labs    03/28/20 0830 03/29/20 0033 03/30/20 1054  BNP 67.4 50.5 70.6      [place]   [facility] If 7PM-7AM, please contact night-coverage at [email or web address], Office  [phone]   05/20/2020, 8:33 AM  LOS: 11 days

## 2020-05-21 ENCOUNTER — Encounter (HOSPITAL_COMMUNITY)
Admission: EM | Disposition: A | Discharge status: Home Health | Payer: Self-pay | Source: Home / Self Care | Attending: Family Medicine

## 2020-05-21 ENCOUNTER — Inpatient Hospital Stay (HOSPITAL_COMMUNITY): Payer: Medicare Other | Admitting: Critical Care Medicine

## 2020-05-21 ENCOUNTER — Encounter (HOSPITAL_COMMUNITY): Payer: Self-pay | Admitting: Certified Registered"

## 2020-05-21 ENCOUNTER — Encounter (HOSPITAL_COMMUNITY): Payer: Self-pay | Admitting: Thoracic Surgery (Cardiothoracic Vascular Surgery)

## 2020-05-21 DIAGNOSIS — J869 Pyothorax without fistula: Secondary | ICD-10-CM | POA: Diagnosis not present

## 2020-05-21 HISTORY — PX: APPLICATION OF WOUND VAC: SHX5189

## 2020-05-21 SURGERY — APPLICATION, WOUND VAC
Anesthesia: Moderate Sedation | Laterality: Right

## 2020-05-21 SURGERY — APPLICATION, WOUND VAC
Anesthesia: Monitor Anesthesia Care | Site: Chest | Laterality: Right

## 2020-05-21 MED ORDER — FENTANYL CITRATE (PF) 250 MCG/5ML IJ SOLN
INTRAMUSCULAR | Status: AC
  Filled 2020-05-21: qty 5

## 2020-05-21 MED ORDER — PROPOFOL 500 MG/50ML IV EMUL
INTRAVENOUS | Status: DC | PRN
  Administered 2020-05-21: 50 ug/kg/min via INTRAVENOUS

## 2020-05-21 MED ORDER — LIDOCAINE 2% (20 MG/ML) 5 ML SYRINGE
INTRAMUSCULAR | Status: AC
  Filled 2020-05-21: qty 5

## 2020-05-21 MED ORDER — HYDROMORPHONE HCL 1 MG/ML IJ SOLN: 0.2500 mg | INTRAMUSCULAR | Status: DC | PRN

## 2020-05-21 MED ORDER — ONDANSETRON HCL 4 MG/2ML IJ SOLN: 4.0000 mg | Freq: Once | INTRAMUSCULAR | Status: DC | PRN

## 2020-05-21 MED ORDER — CHLORHEXIDINE GLUCONATE 0.12 % MT SOLN
15.0000 mL | Freq: Once | OROMUCOSAL | Status: DC
  Filled 2020-05-21: qty 15

## 2020-05-21 MED ORDER — ACETAMINOPHEN 10 MG/ML IV SOLN: 1000.0000 mg | Freq: Once | INTRAVENOUS | Status: DC | PRN

## 2020-05-21 MED ORDER — LACTATED RINGERS IV SOLN: INTRAVENOUS | Status: DC

## 2020-05-21 MED ORDER — LACTATED RINGERS IV SOLN: INTRAVENOUS | Status: DC | PRN

## 2020-05-21 MED ORDER — FENTANYL CITRATE (PF) 100 MCG/2ML IJ SOLN
INTRAMUSCULAR | Status: DC | PRN
  Administered 2020-05-21: 50 ug via INTRAVENOUS

## 2020-05-21 MED ORDER — PROPOFOL 10 MG/ML IV BOLUS
INTRAVENOUS | Status: AC
  Filled 2020-05-21: qty 20

## 2020-05-21 SURGICAL SUPPLY — 17 items
CANISTER SUCT 3000ML PPV (MISCELLANEOUS) ×3 IMPLANT
CANISTER WOUND CARE 500ML ATS (WOUND CARE) ×2 IMPLANT
COVER BACK TABLE 60X90IN (DRAPES) ×2 IMPLANT
DRSG VAC ATS MED SENSATRAC (GAUZE/BANDAGES/DRESSINGS) ×2 IMPLANT
DRSG VERSA FOAM LRG 10X15 (GAUZE/BANDAGES/DRESSINGS) ×2 IMPLANT
GAUZE SPONGE 4X4 12PLY STRL (GAUZE/BANDAGES/DRESSINGS) ×3 IMPLANT
GLOVE BIO SURGEON STRL SZ 6.5 (GLOVE) ×1 IMPLANT
GLOVE BIO SURGEONS STRL SZ 6.5 (GLOVE) ×1
GLOVE BIOGEL PI IND STRL 6.5 (GLOVE) IMPLANT
GLOVE BIOGEL PI INDICATOR 6.5 (GLOVE) ×2
GLOVE SURG SIGNA 7.5 PF LTX (GLOVE) ×4 IMPLANT
GOWN STRL REUS W/ TWL LRG LVL3 (GOWN DISPOSABLE) ×2 IMPLANT
GOWN STRL REUS W/ TWL XL LVL3 (GOWN DISPOSABLE) ×1 IMPLANT
GOWN STRL REUS W/TWL LRG LVL3 (GOWN DISPOSABLE) ×3
GOWN STRL REUS W/TWL XL LVL3 (GOWN DISPOSABLE) ×3
KIT TURNOVER KIT B (KITS) ×3 IMPLANT
TOWEL GREEN STERILE FF (TOWEL DISPOSABLE) ×3 IMPLANT

## 2020-05-21 NOTE — Progress Notes (Signed)
[facility]  PROGRESS NOTE  Mathew Hall OEV:[id number] DOB: 1940/11/13 DOA: 05/09/2020 PCP: [name], MD   Brief HPI:   79 year old male with history of recent COVID-19 infection with empyema treated with antibiotics, hypertension, GERD, CAD s/p PCI presents with complaints of worsening back pain.  He was found to have recurrent empyema and failed outpatient treatment.  Underwent thoracotomy on 05/11/2020 with empyema drainage and rib resection as well as application of wound VAC.   Subjective   Patient seen and examined, denies any complaints. Plan to go to work for  [employer] change.   Assessment/Plan:    1. Empyema necessitates right chest-s/p thoracotomy and drainage of empyema with rib resection as well as application of wound VAC on 05/11/2020.  Patient was initially treated with chest tube and IV antibiotics in September followed by oral antibiotics outpatient.  He presented with worsening chest pain and was found to have persistent subcutaneous mass.  CT showed evidence of persistent empyema.  Patient underwent above procedure.  Continue IV Unasyn till [facility] dressing is removed.  We will switch to p.o. Augmentin for another 5 days once VAC dressing is off.  MRSA PCR is negative.  Wound culture showed no growth . CT surgery is following. Plan to change wound VAC in OR today. 2. 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  He was seen by Dr. Worthy Flank 15 years ago.  S/p neoadjuvant chemotherapy followed by right middle lobectomy and lymph node dissection.  Cytology was negative from pleural fluid in September. 3. Essential hypertension-blood pressures controlled, continue Norvasc 10 mg daily.  Beta-blocker was stopped during last admission in September.  Beta-blocker was discontinued as patient was bradycardic during  this admission.  Blood pressure is well controlled. 4. Bradycardia-resolved after stopping metoprolol.   Blood pressure is well controlled  with Norvasc. 5. History of CAD-no chest pain.  Beta-blocker stopped.  Aspirin resumed.   6. Severe protein calorie malnutrition-dietitian consulted. 7. Anemia due to chronic illness-hemoglobin is stable. 8. Severe malnutrition-BMI 22.09 kg/m, continue Ensure Enlive, MVI.      Lab Results  Component Value Date   SARSCOV2NAA NEGATIVE 05/09/2020   SARSCOV2NAA POSITIVE (A) 03/22/2020     Scheduled medications:   . amLODipine  10 mg Oral Daily  . aspirin  325 mg Oral Daily  . bisacodyl  10 mg Oral Daily  . Chlorhexidine Gluconate Cloth  6 each Topical Daily  . docusate sodium  100 mg Oral BID  . enoxaparin (LOVENOX) injection  40 mg Subcutaneous Daily  . feeding supplement  237 mL Oral BID BM  . multivitamin with minerals  1 tablet Oral Daily  . pantoprazole  40 mg Oral QAC breakfast  . polyethylene glycol  17 g Oral Daily  . potassium chloride SA  20 mEq Oral Daily  . senna-docusate  1 tablet Oral QHS  . Vitamin D (Ergocalciferol)  50,000 Units Oral Weekly         CBG: Recent Labs  Lab 05/17/20 1146  GLUCAP 91    SpO2: 98 %    CBC: Recent Labs  Lab 05/15/20 0029 05/16/20 0029 05/19/20 0107  WBC 5.0 7.3 5.2  HGB 9.7* 9.6* 10.0*  HCT 31.7* 30.7* 31.5*  MCV 85.9 84.8 85.4  PLT 288 322 829    Basic Metabolic Panel: Recent Labs  Lab 05/15/20 0029 05/16/20 0029 05/19/20 0107  NA 135 136 135  K 4.6 4.3 4.0  CL 100 102  101  CO2 26 25 27   GLUCOSE 233* 113* 110*  BUN 20 19 17   CREATININE 0.84 0.85 0.84  CALCIUM 9.2 9.2 9.2     Liver Function Tests: Recent Labs  Lab 05/16/20 0029  AST 19  ALT 15  ALKPHOS 55  BILITOT 0.7  PROT 6.7  ALBUMIN 2.7*     Antibiotics: Anti-infectives (From admission, onward)   Start     Dose/Rate Route Frequency Ordered Stop   05/14/20 1315  ampicillin-sulbactam (UNASYN) 1.5 g in sodium chloride 0.9 % 100 mL IVPB  Status:  Discontinued        1.5 g 200 mL/hr over 30 Minutes  Intravenous To ShortStay Surgical 05/14/20 1310 05/14/20 1542   05/11/20 0600  vancomycin (VANCOCIN) IVPB 1000 mg/200 mL premix        1,000 mg 200 mL/hr over 60 Minutes Intravenous On call to O.R. 05/10/20 1556 05/12/20 0733   05/10/20 1145  ampicillin-sulbactam (UNASYN) 1.5 g in sodium chloride 0.9 % 100 mL IVPB        1.5 g 200 mL/hr over 30 Minutes Intravenous Every 6 hours 05/10/20 1058     05/09/20 1330  cefTRIAXone (ROCEPHIN) 1 g in sodium chloride 0.9 % 100 mL IVPB  Status:  Discontinued        1 g 200 mL/hr over 30 Minutes Intravenous Every 24 hours 05/09/20 1321 05/10/20 1053       DVT prophylaxis: Lovenox  Code Status: Full code  Family Communication: No family at bedside   Consultants:  CT surgery  Procedures:  Thoracotomy and empyema drainage with rib resection and wound VAC application    Objective   Vitals:   05/20/20 2000 05/20/20 2335 05/21/20 0425 05/21/20 0817  BP:  123/68 114/68 122/70  Pulse:  83  72  Resp: 16 20 16  (!) 21  Temp:  97.7 F (36.5 C) 97.8 F (36.6 C) 97.9 F (36.6 C)  TempSrc:  Oral Oral Oral  SpO2:  98% 98% 98%  Weight:      Height:        Intake/Output Summary (Last 24 hours) at 05/21/2020 4765 Last data filed at 05/21/2020 0400 Gross per 24 hour  Intake 687 ml  Output 900 ml  Net -213 ml    11/13 1901 - 11/15 0700 In: 907 [P.O.:707] Out: 4650 [Urine:1550; Drains:100]  Filed Weights   05/12/20 0620 05/14/20 1313 05/18/20 0339  Weight: 65.9 kg 65.9 kg 67.2 kg    Physical Examination:   General-appears in no acute distress Heart-S1-S2, regular, no murmur auscultated Lungs-clear to auscultation bilaterally, no wheezing or crackles auscultated Abdomen-soft, nontender, no organomegaly Extremities-no edema in the lower extremities Neuro-alert, oriented x3, no focal deficit noted  Status is: Inpatient  Dispo: The patient is from: Home              Anticipated d/c is to: Skilled nursing facility versus home               Anticipated d/c date is: 05/21/2020              Patient currently not medically stable for discharge  Barrier to discharge-ongoing treatment for empyema      Data Reviewed:   No results found for this or any previous visit (from the past 240 hour(s)).  BNP (last 3 results) Recent Labs    03/28/20 0830 03/29/20 0033 03/30/20 1054  BNP 67.4 50.5 70.6      [place]   [facility]  If 7PM-7AM, please contact night-coverage at [email or web address], Office  [phone]   05/21/2020, 9:38 AM  LOS: 12 days

## 2020-05-21 NOTE — Anesthesia Procedure Notes (Signed)
Procedure Name: MAC Date/Time: 05/21/2020 12:32 PM Performed by: Darral Dash, DO Pre-anesthesia Checklist: Patient identified, Suction available, Emergency Drugs available, Patient being monitored and Timeout performed Patient Re-evaluated:Patient Re-evaluated prior to induction Oxygen Delivery Method: Simple face mask Preoxygenation: Pre-oxygenation with 100% oxygen Induction Type: IV induction Placement Confirmation: breath sounds checked- equal and bilateral and positive ETCO2 Dental Injury: Teeth and Oropharynx as per pre-operative assessment

## 2020-05-21 NOTE — Anesthesia Postprocedure Evaluation (Signed)
Anesthesia Post Note  Patient: Mathew Hall  Procedure(s) Performed: WOUND VAC CHANGE RIGHT CHEST (Right Chest)     Patient location during evaluation: PACU Anesthesia Type: MAC Level of consciousness: awake and alert Pain management: pain level controlled Vital Signs Assessment: post-procedure vital signs reviewed and stable Respiratory status: spontaneous breathing, nonlabored ventilation, respiratory function stable and patient connected to nasal cannula oxygen Cardiovascular status: stable and blood pressure returned to baseline Postop Assessment: no apparent nausea or vomiting Anesthetic complications: no   No complications documented.  Last Vitals:  Vitals:   05/21/20 1315 05/21/20 1329  BP: 118/63 117/64  Pulse: 72 80  Resp: 17 16  Temp:  36.8 C  SpO2: 98% 100%    Last Pain:  Vitals:   05/21/20 1315  TempSrc:   PainSc: 0-No pain                 Belenda Cruise P Armandina Iman

## 2020-05-21 NOTE — Anesthesia Preprocedure Evaluation (Signed)
Anesthesia Evaluation  Patient identified by MRN, date of birth, ID band Patient awake    Reviewed: Patient's Chart, lab work & pertinent test results  Airway Mallampati: II  TM Distance: >3 FB Neck ROM: Full    Dental  (+) Teeth Intact   Pulmonary asthma , former smoker,  Empyema, lung Ca stage 4    Pulmonary exam normal        Cardiovascular hypertension, Pt. 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    Peds  Hematology negative hematology ROS (+)   Anesthesia Other Findings   Reproductive/Obstetrics                             Anesthesia Physical Anesthesia Plan  ASA: III  Anesthesia Plan: MAC   Post-op Pain Management:    Induction:   PONV Risk Score and Plan: 1 and Ondansetron, Dexamethasone, Propofol infusion and Treatment may vary due to age or medical condition  Airway Management Planned: Simple Face Mask, Natural Airway and Nasal Cannula  Additional Equipment: None  Intra-op Plan:   Post-operative Plan:   Informed Consent: I have reviewed the patients History and Physical, chart, labs and discussed the procedure including the risks, benefits and alternatives for the proposed anesthesia with the patient or authorized representative who has indicated his/her understanding and acceptance.     Dental advisory given  Plan Discussed with: CRNA  Anesthesia Plan Comments: (Lab Results      Component                Value               Date                      WBC                      5.2                 05/19/2020                 HGB                      10.0 (L)            05/19/2020                HCT                      31.5 (L)            05/19/2020                MCV                      85.4                05/19/2020                PLT  323                 05/19/2020          )        Anesthesia Quick Evaluation

## 2020-05-21 NOTE — Progress Notes (Signed)
Physical Therapy Treatment Patient Details Name: Mathew Hall MRN: [id number] DOB: 1940/08/07 Today's Date: 05/21/2020    History of Present Illness Pt is a 79yo male with PMH of recent COVID-19 infection with empyema treated with antibiotics, HTN, GERD, CAD SP PCI. Presents with complaints of worsening back pain. Found to have recurrent empyema and failed outpatient treatment.11/5 thoracotomy with empyema drainage and rib resection as well as application of wound VAC.      PT Comments    Pt in good spirits and reports going for a procedure later today but eager to walk. Pt now amb without AD with fluid gait pattern and supervision. Was going to go outside however to cold for patient as he only had a gown on. Will consider going outside next session if patient has appropriate clothing to put on. Acute PT to cont to follow.    Follow Up Recommendations  No PT follow up;Supervision/Assistance - 24 hour     Equipment Recommendations  None recommended by PT    Recommendations for Other Services       Precautions / Restrictions Precautions Precautions: Fall Precaution Comments: wound vac Restrictions Weight Bearing Restrictions: No    Mobility  Bed Mobility Overal bed mobility: Independent             General bed mobility comments: HOB elevated  Transfers Overall transfer level: Modified independent Equipment used: None             General transfer comment: assist for IV Pole and wound vac only, pt steady, no physical assist needed  Ambulation/Gait Ambulation/Gait assistance: Supervision Gait Distance (Feet): 600 Feet Assistive device: None Gait Pattern/deviations: Step-through pattern Gait velocity: dec Gait velocity interpretation: >2.62 ft/sec, indicative of community ambulatory General Gait Details: decreased cadence but steady and fluid. mild trunk flexion could be baseline due to age or due to R wound vac   Stairs Stairs: Yes Stairs assistance:  Min guard Stair Management: One rail Left;Step to pattern;Forwards Number of Stairs: 2 General stair comments: limited by IV pole and wound vac   Wheelchair Mobility    Modified Rankin (Stroke Patients Only)       Balance Overall balance assessment: Mild deficits observed, not formally tested                                          Cognition Arousal/Alertness: Awake/alert Behavior During Therapy: WFL for tasks assessed/performed Overall Cognitive Status: Within Functional Limits for tasks assessed                                        Exercises      General Comments General comments (skin integrity, edema, etc.): VSS      Pertinent Vitals/Pain Pain Assessment: No/denies pain    Home Living                      Prior Function            PT Goals (current goals can now be found in the care plan section) Acute Rehab PT Goals Patient Stated Goal: go home Progress towards PT goals: Progressing toward goals    Frequency    Min 3X/week      PT Plan Current plan remains appropriate    Co-evaluation  AM-PAC PT "6 Clicks" Mobility   Outcome Measure  Help needed turning from your back to your side while in a flat bed without using bedrails?: None Help needed moving from lying on your back to sitting on the side of a flat bed without using bedrails?: None Help needed moving to and from a bed to a chair (including a wheelchair)?: None Help needed standing up from a chair using your arms (e.g., wheelchair or bedside chair)?: None Help needed to walk in hospital room?: None Help needed climbing 3-5 steps with a railing? : A Little 6 Click Score: 23    End of Session Equipment Utilized During Treatment: Gait belt Activity Tolerance: Patient tolerated treatment well Patient left: in bed;with call bell/phone within reach Nurse Communication: Mobility status PT Visit Diagnosis: Muscle weakness  (generalized) (M62.81)     Time: 1740-8144 PT Time Calculation (min) (ACUTE ONLY): 19 min  Charges:  $Gait Training: 8-22 mins                     Kittie Plater, PT, DPT Acute Rehabilitation Services Pager #: [phone] Office #: [phone]    [place]line Lopes 05/21/2020, 10:47 AM

## 2020-05-21 NOTE — Brief Op Note (Signed)
05/21/2020  12:50 PM  PATIENT:  Mathew Hall  79 y.o. male  PRE-OPERATIVE DIAGNOSIS:  Empyema  POST-OPERATIVE DIAGNOSIS:  Empyema  PROCEDURE:  Procedure(s): WOUND VAC CHANGE RIGHT CHEST (Right)  SURGEON:  Surgeon(s) and Role:    * [name], MD - Primary  PHYSICIAN ASSISTANT:   ASSISTANTS: none   ANESTHESIA:   IV sedation  EBL: minimal  BLOOD ADMINISTERED:none  DRAINS: VAC right chest   LOCAL MEDICATIONS USED:  NONE  SPECIMEN:  No Specimen  DISPOSITION OF SPECIMEN:  N/A  COUNTS:  NO not indicated  TOURNIQUET:  * No tourniquets in log *  DICTATION: .Other Dictation: Dictation Number -  PLAN OF CARE: already an inpatient  PATIENT DISPOSITION:  PACU - hemodynamically stable.   Delay start of Pharmacological VTE agent (>24hrs) due to surgical blood loss or risk of bleeding: no

## 2020-05-21 NOTE — Transfer of Care (Signed)
Immediate Anesthesia Transfer of Care Note  Patient: Mathew Hall  Procedure(s) Performed: WOUND VAC CHANGE RIGHT CHEST (Right Chest)  Patient Location: PACU  Anesthesia Type:MAC  Level of Consciousness: awake, alert  and oriented  Airway & Oxygen Therapy: Patient connected to face mask oxygen  Post-op Assessment: Post -op Vital signs reviewed and stable  Post vital signs: stable  Last Vitals:  Vitals Value Taken Time  BP    Temp    Pulse 86 05/21/20 1246  Resp 14 05/21/20 1246  SpO2 100 % 05/21/20 1246  Vitals shown include unvalidated device data.  Last Pain:  Vitals:   05/21/20 0817  TempSrc: Oral  PainSc: 0-No pain      Patients Stated Pain Goal: 0 (90/38/33 3832)  Complications: No complications documented.

## 2020-05-21 NOTE — Interval H&P Note (Signed)
History and Physical Interval Note: VAC change under sedation 05/21/2020 11:54 AM  Mathew Hall  has presented today for surgery, with the diagnosis of Empyema.  The various methods of treatment have been discussed with the patient and family. After consideration of risks, benefits and other options for treatment, the patient has consented to  Procedure(s): WOUND VAC CHANGE RIGHT CHEST (Right) as a surgical intervention.  The patient's history has been reviewed, patient examined, no change in status, stable for surgery.  I have reviewed the patient's chart and labs.  Questions were answered to the patient's satisfaction.     [name]

## 2020-05-21 NOTE — Anesthesia Postprocedure Evaluation (Signed)
Anesthesia Post Note  Patient: ELIZA GRISSINGER  Procedure(s) Performed: WOUND VAC CHANGE (Right Chest)     Patient location during evaluation: PACU Anesthesia Type: MAC Level of consciousness: awake and alert Pain management: pain level controlled Vital Signs Assessment: post-procedure vital signs reviewed and stable Respiratory status: spontaneous breathing, nonlabored ventilation, respiratory function stable and patient connected to nasal cannula oxygen Cardiovascular status: stable and blood pressure returned to baseline Postop Assessment: no apparent nausea or vomiting Anesthetic complications: no   No complications documented.  Last Vitals:  Vitals:   05/21/20 1315 05/21/20 1329  BP: 118/63 117/64  Pulse: 72 80  Resp: 17 16  Temp:  36.8 C  SpO2: 98% 100%    Last Pain:  Vitals:   05/21/20 1315  TempSrc:   PainSc: 0-No pain                 Levin Dagostino

## 2020-05-22 ENCOUNTER — Encounter (HOSPITAL_COMMUNITY): Payer: Self-pay | Admitting: Thoracic Surgery (Cardiothoracic Vascular Surgery)

## 2020-05-22 DIAGNOSIS — R001 Bradycardia, unspecified: Secondary | ICD-10-CM | POA: Diagnosis not present

## 2020-05-22 DIAGNOSIS — E43 Unspecified severe protein-calorie malnutrition: Secondary | ICD-10-CM | POA: Diagnosis not present

## 2020-05-22 DIAGNOSIS — J869 Pyothorax without fistula: Secondary | ICD-10-CM | POA: Diagnosis not present

## 2020-05-22 MED ORDER — ENSURE ENLIVE PO LIQD
237.0000 mL | Freq: Three times a day (TID) | ORAL | Status: DC
  Administered 2020-05-22 – 2020-05-26 (×14): 237 mL via ORAL

## 2020-05-22 NOTE — Progress Notes (Signed)
Nutrition Follow-up  RD working remotely.  DOCUMENTATION CODES:   Severe malnutrition in context of acute illness/injury  INTERVENTION:   - Increase Ensure Enlive po to TID, each supplement provides 350 kcal and 20 grams of protein  - MVI with minerals daily  - Recommend liberalizing diet to Regular to allow for more food options, discussed with MD who agreed and Regular diet ordered  NUTRITION DIAGNOSIS:   Severe Malnutrition related to acute illness (recurrent rt empyema) as evidenced by percent weight loss, moderate fat depletion, severe fat depletion, severe muscle depletion, moderate muscle depletion.  Ongoing  GOAL:   Patient will meet greater than or equal to 90% of their needs  Progressing  MONITOR:   PO intake, Supplement acceptance, Labs, Weight trends, Skin, I & O's  REASON FOR ASSESSMENT:   Malnutrition Screening Tool    ASSESSMENT:   Mathew Hall is a 79 y.o. male with medical history significant of right-sided empyema recently September 16, remote lung CA on right side, recent COVID infection in September 2021, hypertension, GERD, presented with recurrent right-sided empyema.  Pt admitted with recurrent right sided empyema.  11/05 - s/p thoracotomy, empyema drainage, rib resection, application of wound VAC 11/08 - wound VAC change in OR 11/11 - wound VAC change in OR 11/15 - wound VAC change in OR  Noted plan for next VAC change at the bedside. Per CTS note, if pt tolerates this for a few cycles, he could potentially be discharged home for outpatient wound management.  Weight stable.  Spoke with pt via phone call to room. Pt in good spirits and looking forward to possible d/c next week. Pt states that he is drinking about 3 Ensure Enlive supplements daily. He reports that he is eating well but getting bored of the menu. Diet was liberalized to Regular last week but pt now on Heart Healthy diet. Discussed with MD who agreed with diet  liberalization to Regular. Verbal with readback orders placed.  Current weight: 67.2 kg Admit weight: 66.5 kg  Meal Completion: 25-95%  Medications reviewed and include: dulcolax, colace, Ensure Enlive BID, MVI with minerals, protonix, miralax, klor-con, senna, vitamin D weekly, IV abx  Labs reviewed.  UOP: 1000 ml x 24 hours VAC: 50 ml x 24 hours I/O's: -7.7 L since admit  Diet Order:   Diet Order            Diet Heart Room service appropriate? Yes; Fluid consistency: Thin  Diet effective now                 EDUCATION NEEDS:   Education needs have been addressed  Skin:  Skin Assessment: Skin Integrity Issues: Wound VAC: chest Incisions: chest  Last BM:  05/21/20 small type 5  Height:   Ht Readings from Last 1 Encounters:  05/21/20 5' 7.99" (1.727 m)    Weight:   Wt Readings from Last 1 Encounters:  05/21/20 67.2 kg    Ideal Body Weight:  70 kg  BMI:  Body mass index is 22.53 kg/m.  Estimated Nutritional Needs:   Kcal:  2100-2300  Protein:  115-130 grams  Fluid:  > 2 L    Gaynell Face, MS, RD, LDN Inpatient Clinical Dietitian Please see AMiON for contact information.

## 2020-05-22 NOTE — Progress Notes (Signed)
Ambulated along the hallway  about 400 feet tolerated well.

## 2020-05-22 NOTE — Op Note (Signed)
NAME: YAKUB, LODES MEDICAL RECORD UT:[id number] ACCOUNT [id number] DATE OF BIRTH:14-May-1941 FACILITY: [facility] LOCATION: [facility] PHYSICIAN:Gardy Montanari Chaya Jan, MD  OPERATIVE REPORT  DATE OF PROCEDURE:  05/21/2020  PREOPERATIVE DIAGNOSIS:  Empyema, needs VAC change.  POSTOPERATIVE DIAGNOSIS:  Empyema, needs VAC change.  PROCEDURE:  VAC change.  SURGEON:  Modesto Charon, MD  ASSISTANT:  None.  ANESTHESIA:  MAC.  FINDINGS:  Wound granulating well.  The chest wall defect 9 cm in length, 4 cm in width, 3 cm in depth, chest wall cavity approximately 6 cm in diameter.  CLINICAL NOTE:  Mr. 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  He is now due for a VAC sponge change.  OPERATIVE NOTE:  Mr. Felicetti was brought to the operating room on 05/21/2020.  He was given intravenous sedation monitored by the Anesthesia service, but remained awake and talking throughout the procedure.  The dressing and outer sponge were removed and  he experienced a very mild discomfort.  The pleural sponges then were removed and he tolerated that extremely well.  A white VAC sponge was cut in half and then 1 piece of the VAC sponge was placed into the chest and then the other was cut in half and half of that was  placed into the pleural space as well.  A regular VAC sponge then was cut to fit the chest wall defect.  The covering dressing was applied.  Suction was applied.  The patient tolerated the procedure well and was taken to the Postanesthetic Care Unit in  good condition.  HN/NUANCE  D:05/21/2020 T:05/22/2020 JOB:[id number]

## 2020-05-22 NOTE — Progress Notes (Signed)
[facility]  PROGRESS NOTE  Mathew Hall:[id number] DOB: 1940/12/19 DOA: 05/09/2020 PCP: [name], MD   Brief HPI:   79 year old male with history of recent COVID-19 infection with empyema treated with antibiotics, hypertension, GERD, CAD s/p PCI presents with complaints of worsening back pain.  He was found to have recurrent empyema and failed outpatient treatment.  Underwent thoracotomy on 05/11/2020 with empyema drainage and rib resection as well as application of wound VAC.   Subjective   Patient seen and examined, denies any complaints.   Assessment/Plan:    1. Empyema necessitates right chest-s/p thoracotomy and drainage of empyema with rib resection as well as application of wound VAC on 05/11/2020.  Patient was initially treated with chest tube and IV antibiotics in September followed by oral antibiotics outpatient.  He presented with worsening chest pain and was found to have persistent subcutaneous mass.  CT showed evidence of persistent empyema.  Patient underwent above procedure.  Continue IV Unasyn till [facility] dressing is removed.  We will switch to p.o. Augmentin for another 5 days once VAC dressing is off.  MRSA PCR is negative.  Wound culture showed no growth . CT surgery is following. Plan to change wound VAC at bedside.  If he tolerate,s this for few cycles then he can be discharged home for outpatient wound management.  CT surgery working on arranging for portable VAC equipment and Espino RN. 2. 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  He was seen by Dr. Worthy Flank 15 years ago.  S/p neoadjuvant chemotherapy followed by right middle lobectomy and lymph node dissection.  Cytology was negative from pleural fluid in September. 3. Essential hypertension-blood pressures controlled, continue Norvasc 10 mg daily.  Beta-blocker was stopped during last admission in September.  Beta-blocker was discontinued as  patient was bradycardic during  this admission.  Blood pressure is well controlled. 4. Bradycardia-resolved after stopping metoprolol.  Blood pressure is well controlled  with Norvasc. 5. History of CAD-no chest pain.  Beta-blocker stopped.  Aspirin resumed.   6. Severe protein calorie malnutrition-dietitian consulted. 7. Anemia due to chronic illness-hemoglobin is stable. 8. Severe malnutrition-BMI 22.09 kg/m, continue Ensure Enlive, MVI.  Change to regular diet.      Lab Results  Component Value Date   SARSCOV2NAA NEGATIVE 05/09/2020   SARSCOV2NAA POSITIVE (A) 03/22/2020     Scheduled medications:   . amLODipine  10 mg Oral Daily  . aspirin  325 mg Oral Daily  . bisacodyl  10 mg Oral Daily  . Chlorhexidine Gluconate Cloth  6 each Topical Daily  . docusate sodium  100 mg Oral BID  . enoxaparin (LOVENOX) injection  40 mg Subcutaneous Daily  . feeding supplement  237 mL Oral TID BM  . multivitamin with minerals  1 tablet Oral Daily  . pantoprazole  40 mg Oral QAC breakfast  . polyethylene glycol  17 g Oral Daily  . potassium chloride SA  20 mEq Oral Daily  . senna-docusate  1 tablet Oral QHS  . Vitamin D (Ergocalciferol)  50,000 Units Oral Weekly         CBG: Recent Labs  Lab 05/17/20 1146  GLUCAP 91    SpO2: 99 %    CBC: Recent Labs  Lab 05/16/20 0029 05/19/20 0107  WBC 7.3 5.2  HGB 9.6* 10.0*  HCT 30.7* 31.5*  MCV 84.8 85.4  PLT 322 676    Basic Metabolic Panel: Recent Labs  Lab 05/16/20  0029 05/19/20 0107  NA 136 135  K 4.3 4.0  CL 102 101  CO2 25 27  GLUCOSE 113* 110*  BUN 19 17  CREATININE 0.85 0.84  CALCIUM 9.2 9.2     Liver Function Tests: Recent Labs  Lab 05/16/20 0029  AST 19  ALT 15  ALKPHOS 55  BILITOT 0.7  PROT 6.7  ALBUMIN 2.7*     Antibiotics: Anti-infectives (From admission, onward)   Start     Dose/Rate Route Frequency Ordered Stop   05/14/20 1315  ampicillin-sulbactam (UNASYN) 1.5 g in sodium chloride 0.9 %  100 mL IVPB  Status:  Discontinued        1.5 g 200 mL/hr over 30 Minutes Intravenous To ShortStay Surgical 05/14/20 1310 05/14/20 1542   05/11/20 0600  vancomycin (VANCOCIN) IVPB 1000 mg/200 mL premix        1,000 mg 200 mL/hr over 60 Minutes Intravenous On call to O.R. 05/10/20 1556 05/12/20 0733   05/10/20 1145  ampicillin-sulbactam (UNASYN) 1.5 g in sodium chloride 0.9 % 100 mL IVPB        1.5 g 200 mL/hr over 30 Minutes Intravenous Every 6 hours 05/10/20 1058     05/09/20 1330  cefTRIAXone (ROCEPHIN) 1 g in sodium chloride 0.9 % 100 mL IVPB  Status:  Discontinued        1 g 200 mL/hr over 30 Minutes Intravenous Every 24 hours 05/09/20 1321 05/10/20 1053       DVT prophylaxis: Lovenox  Code Status: Full code  Family Communication: No family at bedside   Consultants:  CT surgery  Procedures:  Thoracotomy and empyema drainage with rib resection and wound VAC application    Objective   Vitals:   05/22/20 0300 05/22/20 0435 05/22/20 0741 05/22/20 1141  BP:  135/69 (!) 118/92 107/79  Pulse:  79 76 83  Resp: 17 15 (!) 23 20  Temp:  97.8 F (36.6 C) 97.7 F (36.5 C) 97.6 F (36.4 C)  TempSrc:  Oral Oral Oral  SpO2:  95%  99%  Weight:      Height:        Intake/Output Summary (Last 24 hours) at 05/22/2020 1225 Last data filed at 05/22/2020 0941 Gross per 24 hour  Intake 637 ml  Output 1050 ml  Net -413 ml    11/14 1901 - 11/16 0700 In: 450 [P.O.:150; I.V.:200] Out: 1850 [Urine:1800; Drains:50]  Filed Weights   05/14/20 1313 05/18/20 0339 05/21/20 1133  Weight: 65.9 kg 67.2 kg 67.2 kg    Physical Examination:   General-appears in no acute distress Heart-S1-S2, regular, no murmur auscultated Lungs-clear to auscultation bilaterally, no wheezing or crackles auscultated Abdomen-soft, nontender, no organomegaly Extremities-no edema in the lower extremities Neuro-alert, oriented x3, no focal deficit noted  Status is: Inpatient  Dispo: The patient is  from: Home              Anticipated d/c is to: Skilled nursing facility versus home              Anticipated d/c date is: 05/26/2020              Patient currently not medically stable for discharge  Barrier to discharge-ongoing treatment for empyema      Data Reviewed:   No results found for this or any previous visit (from the past 240 hour(s)).  BNP (last 3 results) Recent Labs    03/28/20 0830 03/29/20 0033 03/30/20 1054  BNP 67.4 50.5 70.6  Oswald Hillock   [facility] If 7PM-7AM, please contact night-coverage at [email or web address], Office  [phone]   05/22/2020, 12:25 PM  LOS: 13 days

## 2020-05-22 NOTE — Telephone Encounter (Signed)
Pt had empyema drained with wound vac placement on 11/5. Will close encounter and forward to Dr. Erin Fulling as Juluis Rainier.

## 2020-05-22 NOTE — Progress Notes (Addendum)
      [address]       [place] [address]             [phone]      1 Day Post-Op Procedure(s) (LRB): WOUND VAC CHANGE RIGHT CHEST (Right) Subjective: Sitting up, finished breakfast. Says he feels good, denies pain.   Objective: Vital signs in last 24 hours: Temp:  [97.7 F (36.5 C)-98.2 F (36.8 C)] 97.7 F (36.5 C) (11/16 0741) Pulse Rate:  [72-86] 76 (11/16 0741) Cardiac Rhythm: Normal sinus rhythm (11/16 0752) Resp:  [14-27] 23 (11/16 0741) BP: (108-135)/(60-92) 118/92 (11/16 0741) SpO2:  [93 %-100 %] 95 % (11/16 0435) Weight:  [67.2 kg] 67.2 kg (11/15 1133)   Intake/Output from previous day: 11/15 0701 - 11/16 0700 In: 300 [I.V.:200; IV Piggyback:100] Out: 1050 [Urine:1000; Drains:50] Intake/Output this shift: No intake/output data recorded.  General appearance: alert, cooperative and no distress Neurologic: intact Heart: regular rate and rhythm Lungs: Breath sounds are clear, good oxygenation on RA.  Wound: the wound vac is intact and functioning appropriately.   Lab Results: No results for input(s): WBC, HGB, HCT, PLT in the last 72 hours. BMET: No results for input(s): NA, K, CL, CO2, GLUCOSE, BUN, CREATININE, CALCIUM in the last 72 hours.  PT/INR: No results for input(s): LABPROT, INR in the last 72 hours. ABG    Component Value Date/Time   PHART 7.467 (H) 05/10/2020 1715   HCO3 24.0 05/10/2020 1715   TCO2 25 03/06/2009 0437   ACIDBASEDEF 0.5 03/01/2009 0922   O2SAT 97.4 05/10/2020 1715   CBG (last 3)  No results for input(s): GLUCAP in the last 72 hours.  Assessment/Plan: S/P Procedure(s) (LRB): WOUND VAC CHANGE RIGHT CHEST (Right)  -POD-11 modified Eloesser procedure for recurrent right empyema. He has had several vac changes in the OR with most recent yesterday which he tolerated with only minimal sedation. Will plan next vac change at the bedside. If he tolerates this for a few cycles he could potentially be discharged to  home for outpatient wound mgt.  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     LOS: 13 days    Malon Kindle [phone] 05/22/2020  Patient seen and examined, agree with assessment and plan as outlined above  Remo Lipps C. Roxan Hockey, MD [facility] [phone]

## 2020-05-23 DIAGNOSIS — J860 Pyothorax with fistula: Secondary | ICD-10-CM | POA: Diagnosis not present

## 2020-05-23 NOTE — Progress Notes (Signed)
PROGRESS NOTE    Mathew Hall  KNL:[id number] DOB: 03-Apr-1941 DOA: 05/09/2020 PCP: [name], MD     Brief Narrative:  Mathew Hall is a 79 year old male with history of recent COVID-19 infection with empyema treated with antibiotics, hypertension, GERD, CAD s/p PCI presents with complaints of worsening back pain.  He was found to have recurrent empyema and failed outpatient treatment.  Underwent thoracotomy on 05/11/2020 with empyema drainage and rib resection as well as application of wound VAC.   New events last 24 hours / Subjective: Sitting in a chair this morning.  He denies any worsening pain or worsening shortness of breath.  Feeling well overall.  Assessment & Plan:   Active Problems:   Empyema (HCC)   Protein-calorie malnutrition, severe   Empyema with pleural fistula (HCC)   Recurrent empyema -Status post thoracotomy and drainage of empyema with rib resection, wound VAC application 79/0 -Wound culture negative  -Continue IV Unasyn until wound VAC dressing is removed, plan to de-escalate to p.o. Augmentin for 5 days once wound VAC dressing is off -Per CT surgery  Essential hypertension -Continue Norvasc -Beta-blocker was discontinued due to bradycardia   CAD -Continue aspirin  History of metastatic non-small cell lung cancer -Seen by Dr. Julien Nordmann 15 years ago, status post neoadjuvant chemotherapy, right middle lobectomy and lymph node dissection    DVT prophylaxis:  enoxaparin (LOVENOX) injection 40 mg Start: 05/14/20 2100 SCD's Start: 05/14/20 1543  Code Status: Full code Family Communication: No family at bedside Disposition Plan:  Status is: Inpatient  Remains inpatient appropriate because:Inpatient level of care appropriate due to severity of illness   Dispo: The patient is from: Home              Anticipated d/c is to: Home              Anticipated d/c date is: 2 days              Patient currently is not medically stable to d/c.  Await  discharge clearance from cardiothoracic surgery team.  Patient will need to tolerate bedside wound VAC changes prior to discharge home.      Consultants:   Cardiothoracic surgery  Critical care  Urology   Antimicrobials:  Anti-infectives (From admission, onward)   Start     Dose/Rate Route Frequency Ordered Stop   05/14/20 1315  ampicillin-sulbactam (UNASYN) 1.5 g in sodium chloride 0.9 % 100 mL IVPB  Status:  Discontinued        1.5 g 200 mL/hr over 30 Minutes Intravenous To ShortStay Surgical 05/14/20 1310 05/14/20 1542   05/11/20 0600  vancomycin (VANCOCIN) IVPB 1000 mg/200 mL premix        1,000 mg 200 mL/hr over 60 Minutes Intravenous On call to O.R. 05/10/20 1556 05/12/20 0733   05/10/20 1145  ampicillin-sulbactam (UNASYN) 1.5 g in sodium chloride 0.9 % 100 mL IVPB        1.5 g 200 mL/hr over 30 Minutes Intravenous Every 6 hours 05/10/20 1058     05/09/20 1330  cefTRIAXone (ROCEPHIN) 1 g in sodium chloride 0.9 % 100 mL IVPB  Status:  Discontinued        1 g 200 mL/hr over 30 Minutes Intravenous Every 24 hours 05/09/20 1321 05/10/20 1053        Objective: Vitals:   05/22/20 2009 05/22/20 2341 05/23/20 0404 05/23/20 0749  BP: 139/66 (!) 121/57 102/61 120/77  Pulse: 76 74 68 78  Resp: 20 14 18  (!)  22  Temp: 97.7 F (36.5 C) 97.7 F (36.5 C) 97.7 F (36.5 C) 97.8 F (36.6 C)  TempSrc: Oral Oral Oral Oral  SpO2: 93% 98% 96% 98%  Weight:      Height:        Intake/Output Summary (Last 24 hours) at 05/23/2020 1208 Last data filed at 05/23/2020 3559 Gross per 24 hour  Intake 694 ml  Output 1050 ml  Net -356 ml   Filed Weights   05/14/20 1313 05/18/20 0339 05/21/20 1133  Weight: 65.9 kg 67.2 kg 67.2 kg    Examination:  General exam: Appears calm and comfortable  Respiratory system: Clear to auscultation. Respiratory effort normal. No respiratory distress. No conversational dyspnea. + Right posterior thorax wound VAC in place Cardiovascular system: S1 &  S2 heard, RRR. No murmurs. No pedal edema. Gastrointestinal system: Abdomen is nondistended, soft and nontender. Normal bowel sounds heard. Central nervous system: Alert and oriented. No focal neurological deficits. Speech clear.  Extremities: Symmetric in appearance  Skin: No rashes, lesions or ulcers on exposed skin  Psychiatry: Judgement and insight appear normal. Mood & affect appropriate.   Data Reviewed: I have personally reviewed following labs and imaging studies  CBC: Recent Labs  Lab 05/19/20 0107  WBC 5.2  HGB 10.0*  HCT 31.5*  MCV 85.4  PLT 741   Basic Metabolic Panel: Recent Labs  Lab 05/19/20 0107  NA 135  K 4.0  CL 101  CO2 27  GLUCOSE 110*  BUN 17  CREATININE 0.84  CALCIUM 9.2   GFR: Estimated Creatinine Clearance: 68.9 mL/min (by C-G formula based on SCr of 0.84 mg/dL). Liver Function Tests: No results for input(s): AST, ALT, ALKPHOS, BILITOT, PROT, ALBUMIN in the last 168 hours. No results for input(s): LIPASE, AMYLASE in the last 168 hours. No results for input(s): AMMONIA in the last 168 hours. Coagulation Profile: No results for input(s): INR, PROTIME in the last 168 hours. Cardiac Enzymes: No results for input(s): CKTOTAL, CKMB, CKMBINDEX, TROPONINI in the last 168 hours. BNP (last 3 results) No results for input(s): PROBNP in the last 8760 hours. HbA1C: No results for input(s): HGBA1C in the last 72 hours. CBG: Recent Labs  Lab 05/17/20 1146  GLUCAP 91   Lipid Profile: No results for input(s): CHOL, HDL, LDLCALC, TRIG, CHOLHDL, LDLDIRECT in the last 72 hours. Thyroid Function Tests: No results for input(s): TSH, T4TOTAL, FREET4, T3FREE, THYROIDAB in the last 72 hours. Anemia Panel: No results for input(s): VITAMINB12, FOLATE, FERRITIN, TIBC, IRON, RETICCTPCT in the last 72 hours. Sepsis Labs: No results for input(s): PROCALCITON, LATICACIDVEN in the last 168 hours.  No results found for this or any previous visit (from the past 240  hour(s)).    Radiology Studies: No results found.    Scheduled Meds: . amLODipine  10 mg Oral Daily  . aspirin  325 mg Oral Daily  . bisacodyl  10 mg Oral Daily  . Chlorhexidine Gluconate Cloth  6 each Topical Daily  . docusate sodium  100 mg Oral BID  . enoxaparin (LOVENOX) injection  40 mg Subcutaneous Daily  . feeding supplement  237 mL Oral TID BM  . multivitamin with minerals  1 tablet Oral Daily  . pantoprazole  40 mg Oral QAC breakfast  . polyethylene glycol  17 g Oral Daily  . senna-docusate  1 tablet Oral QHS  . Vitamin D (Ergocalciferol)  50,000 Units Oral Weekly   Continuous Infusions: . ampicillin-sulbactam (UNASYN) IV 1.5 g (05/23/20 0902)  LOS: 14 days      Time spent: 35 minutes   Dessa Phi, DO [facility] 05/23/2020, 12:08 PM   Available via Epic secure chat 7am-7pm After these hours, please refer to coverage provider listed on [email or web address]

## 2020-05-23 NOTE — Progress Notes (Signed)
      [address]       [place] [address]             [phone]      2 Days Post-Op Procedure(s) (LRB): WOUND VAC CHANGE RIGHT CHEST (Right) Subjective: Says he feels good, denies pain. No new concerns.   Objective: Vital signs in last 24 hours: Temp:  [97.6 F (36.4 C)-98.3 F (36.8 C)] 97.8 F (36.6 C) (11/17 0749) Pulse Rate:  [68-83] 78 (11/17 0749) Cardiac Rhythm: Normal sinus rhythm (11/17 0700) Resp:  [14-22] 22 (11/17 0749) BP: (102-139)/(57-79) 120/77 (11/17 0749) SpO2:  [93 %-99 %] 98 % (11/17 0749)   Intake/Output from previous day: 11/16 0701 - 11/17 0700 In: 1151 [P.O.:951; IV Piggyback:200] Out: 1050 [Urine:950; Drains:100] Intake/Output this shift: No intake/output data recorded.  General appearance: alert, cooperative and no distress Neurologic: intact Heart: regular rate and rhythm Lungs: Breath sounds are clear, good oxygenation on RA.  Wound: the wound vac is intact and functioning appropriately, minimal serous drainage.  Lab Results: No results for input(s): WBC, HGB, HCT, PLT in the last 72 hours. BMET: No results for input(s): NA, K, CL, CO2, GLUCOSE, BUN, CREATININE, CALCIUM in the last 72 hours.  PT/INR: No results for input(s): LABPROT, INR in the last 72 hours. ABG    Component Value Date/Time   PHART 7.467 (H) 05/10/2020 1715   HCO3 24.0 05/10/2020 1715   TCO2 25 03/06/2009 0437   ACIDBASEDEF 0.5 03/01/2009 0922   O2SAT 97.4 05/10/2020 1715   CBG (last 3)  No results for input(s): GLUCAP in the last 72 hours.  Assessment/Plan: S/P Procedure(s) (LRB): WOUND VAC CHANGE RIGHT CHEST (Right)  -POD-12 modified Eloesser procedure for recurrent right empyema. He has had several vac changes in the OR with most recent on11/15 which he tolerated with only minimal sedation. Will plan next vac change at the bedside possibly later today.   His home wound vac equipment has been delivered to the unit.   LOS: 13 days     Antony Odea, [place] [phone] 05/23/2020

## 2020-05-23 NOTE — Progress Notes (Signed)
Physical Therapy Treatment Patient Details Name: Mathew Hall MRN: [id number] DOB: 08-07-1940 Today's Date: 05/23/2020    History of Present Illness Pt is a 79yo male with PMH of recent COVID-19 infection with empyema treated with antibiotics, HTN, GERD, CAD SP PCI. Presents with complaints of worsening back pain. Found to have recurrent empyema and failed outpatient treatment.11/5 thoracotomy with empyema drainage and rib resection as well as application of wound VAC.      PT Comments    Pt eager to walk outside. Pt agreeable to push w/c with wound vac in case he gets tired and needs to sit. Pt is mod I for transfers and supervision for ambulation pushing w/c. Pt with seated rest break prior to return to room. Pt continues to progress towards his goals today and is looking forward to possibly being home for Thanksgiving. PT will continue to follow acutely.    Follow Up Recommendations  No PT follow up;Supervision/Assistance - 24 hour     Equipment Recommendations  None recommended by PT       Precautions / Restrictions Precautions Precautions: Fall Precaution Comments: wound vac    Mobility  Bed Mobility               General bed mobility comments: OOB in recliner  Transfers Overall transfer level: Modified independent Equipment used: None             General transfer comment: assist for IV Pole and wound vac only, pt steady, no physical assist needed  Ambulation/Gait Ambulation/Gait assistance: Supervision Gait Distance (Feet): 600 Feet Assistive device: Pushed wheelchair Gait Pattern/deviations: Step-through pattern Gait velocity: dec Gait velocity interpretation: >2.62 ft/sec, indicative of community ambulatory General Gait Details: pushed w/c due to increased distance ambulation, strong gait pattern         Balance Overall balance assessment: Mild deficits observed, not formally tested                                           Cognition Arousal/Alertness: Awake/alert Behavior During Therapy: WFL for tasks assessed/performed Overall Cognitive Status: Within Functional Limits for tasks assessed                                           General Comments General comments (skin integrity, edema, etc.): HR max with end ambulation 110 bpm with 2/4 DoE      Pertinent Vitals/Pain Pain Assessment: No/denies pain           PT Goals (current goals can now be found in the care plan section) Acute Rehab PT Goals Patient Stated Goal: go home PT Goal Formulation: With patient Time For Goal Achievement: 05/29/20 Potential to Achieve Goals: Good Progress towards PT goals: Progressing toward goals    Frequency    Min 3X/week      PT Plan Current plan remains appropriate       AM-PAC PT "6 Clicks" Mobility   Outcome Measure  Help needed turning from your back to your side while in a flat bed without using bedrails?: None Help needed moving from lying on your back to sitting on the side of a flat bed without using bedrails?: None Help needed moving to and from a bed to a chair (including a wheelchair)?: None Help needed standing  up from a chair using your arms (e.g., wheelchair or bedside chair)?: None Help needed to walk in hospital room?: None Help needed climbing 3-5 steps with a railing? : A Little 6 Click Score: 23    End of Session Equipment Utilized During Treatment: Gait belt Activity Tolerance: Patient tolerated treatment well Patient left: with call bell/phone within reach;in chair Nurse Communication: Mobility status PT Visit Diagnosis: Muscle weakness (generalized) (M62.81)     Time: 5806-3868 PT Time Calculation (min) (ACUTE ONLY): 24 min  Charges:  $Therapeutic Exercise: 23-37 mins                     Rhyli Depaula B. Migdalia Dk PT, DPT Acute Rehabilitation Services Pager [phone] Office [phone]    [place] 05/23/2020, 3:55 PM

## 2020-05-24 DIAGNOSIS — J860 Pyothorax with fistula: Secondary | ICD-10-CM | POA: Diagnosis not present

## 2020-05-24 NOTE — Progress Notes (Signed)
PROGRESS NOTE    Mathew Hall  OXB:[id number] DOB: 01/16/1941 DOA: 05/09/2020 PCP: [name], MD     Brief Narrative:  Mathew Hall is a 79 year old male with history of recent COVID-19 infection with empyema treated with antibiotics, hypertension, GERD, CAD s/p PCI presents with complaints of worsening back pain.  He was found to have recurrent empyema and failed outpatient treatment.  Underwent thoracotomy on 05/11/2020 with empyema drainage and rib resection as well as application of wound VAC.   New events last 24 hours / Subjective: No new complaints today, states that he is feeling well overall Assessment & Plan:   Active Problems:   Empyema (HCC)   Protein-calorie malnutrition, severe   Empyema with pleural fistula (HCC)   Recurrent empyema -Status post thoracotomy and drainage of empyema with rib resection, wound VAC application 68/3 -Wound culture negative  -Continue IV Unasyn until wound VAC dressing is removed, plan to de-escalate to p.o. Augmentin for 5 days once wound VAC dressing is off -Per CT surgery  Essential hypertension -Continue Norvasc -Beta-blocker was discontinued due to bradycardia   CAD -Continue aspirin  History of metastatic non-small cell lung cancer -Seen by Dr. Julien Nordmann 15 years ago, status post neoadjuvant chemotherapy, right middle lobectomy and lymph node dissection    DVT prophylaxis:  enoxaparin (LOVENOX) injection 40 mg Start: 05/14/20 2100 SCD's Start: 05/14/20 1543  Code Status: Full code Family Communication: No family at bedside Disposition Plan:  Status is: Inpatient  Remains inpatient appropriate because:Inpatient level of care appropriate due to severity of illness   Dispo: The patient is from: Home              Anticipated d/c is to: Home              Anticipated d/c date is: 2 days              Patient currently is not medically stable to d/c.  Await discharge clearance from cardiothoracic surgery team.   Patient will need to tolerate bedside wound VAC changes prior to discharge home.      Consultants:   Cardiothoracic surgery  Critical care  Urology   Antimicrobials:  Anti-infectives (From admission, onward)   Start     Dose/Rate Route Frequency Ordered Stop   05/14/20 1315  ampicillin-sulbactam (UNASYN) 1.5 g in sodium chloride 0.9 % 100 mL IVPB  Status:  Discontinued        1.5 g 200 mL/hr over 30 Minutes Intravenous To ShortStay Surgical 05/14/20 1310 05/14/20 1542   05/11/20 0600  vancomycin (VANCOCIN) IVPB 1000 mg/200 mL premix        1,000 mg 200 mL/hr over 60 Minutes Intravenous On call to O.R. 05/10/20 1556 05/12/20 0733   05/10/20 1145  ampicillin-sulbactam (UNASYN) 1.5 g in sodium chloride 0.9 % 100 mL IVPB        1.5 g 200 mL/hr over 30 Minutes Intravenous Every 6 hours 05/10/20 1058     05/09/20 1330  cefTRIAXone (ROCEPHIN) 1 g in sodium chloride 0.9 % 100 mL IVPB  Status:  Discontinued        1 g 200 mL/hr over 30 Minutes Intravenous Every 24 hours 05/09/20 1321 05/10/20 1053       Objective: Vitals:   05/23/20 1601 05/23/20 1916 05/23/20 2314 05/24/20 0311  BP: 113/63 132/65 129/60 (!) 100/55  Pulse:  75 75 67  Resp: 19 20 19 14   Temp: 98.2 F (36.8 C) 97.6 F (36.4 C) 97.7  F (36.5 C) 97.6 F (36.4 C)  TempSrc: Oral Oral Oral Oral  SpO2:  98% 100% 95%  Weight:      Height:        Intake/Output Summary (Last 24 hours) at 05/24/2020 1105 Last data filed at 05/24/2020 0312 Gross per 24 hour  Intake 677 ml  Output 1050 ml  Net -373 ml   Filed Weights   05/14/20 1313 05/18/20 0339 05/21/20 1133  Weight: 65.9 kg 67.2 kg 67.2 kg    Examination: General exam: Appears calm and comfortable  Respiratory system: Clear to auscultation. Respiratory effort normal.  Wound VAC right posterior thorax Cardiovascular system: S1 & S2 heard, RRR. No pedal edema. Gastrointestinal system: Abdomen is nondistended, soft and nontender. Normal bowel sounds  heard. Central nervous system: Alert and oriented. Non focal exam. Speech clear  Extremities: Symmetric in appearance bilaterally  Skin: No rashes, lesions or ulcers on exposed skin  Psychiatry: Judgement and insight appear stable. Mood & affect appropriate.   Data Reviewed: I have personally reviewed following labs and imaging studies  CBC: Recent Labs  Lab 05/19/20 0107  WBC 5.2  HGB 10.0*  HCT 31.5*  MCV 85.4  PLT 185   Basic Metabolic Panel: Recent Labs  Lab 05/19/20 0107  NA 135  K 4.0  CL 101  CO2 27  GLUCOSE 110*  BUN 17  CREATININE 0.84  CALCIUM 9.2   GFR: Estimated Creatinine Clearance: 68.9 mL/min (by C-G formula based on SCr of 0.84 mg/dL). Liver Function Tests: No results for input(s): AST, ALT, ALKPHOS, BILITOT, PROT, ALBUMIN in the last 168 hours. No results for input(s): LIPASE, AMYLASE in the last 168 hours. No results for input(s): AMMONIA in the last 168 hours. Coagulation Profile: No results for input(s): INR, PROTIME in the last 168 hours. Cardiac Enzymes: No results for input(s): CKTOTAL, CKMB, CKMBINDEX, TROPONINI in the last 168 hours. BNP (last 3 results) No results for input(s): PROBNP in the last 8760 hours. HbA1C: No results for input(s): HGBA1C in the last 72 hours. CBG: Recent Labs  Lab 05/17/20 1146  GLUCAP 91   Lipid Profile: No results for input(s): CHOL, HDL, LDLCALC, TRIG, CHOLHDL, LDLDIRECT in the last 72 hours. Thyroid Function Tests: No results for input(s): TSH, T4TOTAL, FREET4, T3FREE, THYROIDAB in the last 72 hours. Anemia Panel: No results for input(s): VITAMINB12, FOLATE, FERRITIN, TIBC, IRON, RETICCTPCT in the last 72 hours. Sepsis Labs: No results for input(s): PROCALCITON, LATICACIDVEN in the last 168 hours.  No results found for this or any previous visit (from the past 240 hour(s)).    Radiology Studies: No results found.    Scheduled Meds:  amLODipine  10 mg Oral Daily   aspirin  325 mg Oral Daily    bisacodyl  10 mg Oral Daily   Chlorhexidine Gluconate Cloth  6 each Topical Daily   docusate sodium  100 mg Oral BID   enoxaparin (LOVENOX) injection  40 mg Subcutaneous Daily   feeding supplement  237 mL Oral TID BM   multivitamin with minerals  1 tablet Oral Daily   pantoprazole  40 mg Oral QAC breakfast   polyethylene glycol  17 g Oral Daily   senna-docusate  1 tablet Oral QHS   Vitamin D (Ergocalciferol)  50,000 Units Oral Weekly   Continuous Infusions:  ampicillin-sulbactam (UNASYN) IV 1.5 g (05/24/20 0921)     LOS: 15 days      Time spent: 25 minutes   Dessa Phi, DO [facility] 05/24/2020, 11:05  AM   Available via Epic secure chat 7am-7pm After these hours, please refer to coverage provider listed on [email or web address]

## 2020-05-24 NOTE — Progress Notes (Addendum)
      [address]       [place], [address]             [phone]      3 Days Post-Op Procedure(s) (LRB): WOUND VAC CHANGE RIGHT CHEST (Right) Subjective: Says he feels good, denies pain. No new concerns.  Very appreciative of the care he has received.   Objective: Vital signs in last 24 hours: Temp:  [97.6 F (36.4 C)-98.2 F (36.8 C)] 97.6 F (36.4 C) (11/18 0311) Pulse Rate:  [67-75] 67 (11/18 0311) Cardiac Rhythm: Normal sinus rhythm;Bundle branch block (11/18 0715) Resp:  [14-20] 14 (11/18 0311) BP: (100-132)/(55-65) 100/55 (11/18 0311) SpO2:  [95 %-100 %] 95 % (11/18 0311)   Intake/Output from previous day: 11/17 0701 - 11/18 0700 In: 777 [P.O.:677; IV Piggyback:100] Out: 1050 [Urine:1000; Drains:50] Intake/Output this shift: No intake/output data recorded.  General appearance: alert, cooperative and no distress Neurologic: intact Heart: regular rate and rhythm Lungs: Breath sounds are clear, good oxygenation on RA.  Wound: the wound vac is intact and functioning appropriately,only 37ml serous drainage past 24 hours.  Lab Results: No results for input(s): WBC, HGB, HCT, PLT in the last 72 hours. BMET: No results for input(s): NA, K, CL, CO2, GLUCOSE, BUN, CREATININE, CALCIUM in the last 72 hours.  PT/INR: No results for input(s): LABPROT, INR in the last 72 hours. ABG    Component Value Date/Time   PHART 7.467 (H) 05/10/2020 1715   HCO3 24.0 05/10/2020 1715   TCO2 25 03/06/2009 0437   ACIDBASEDEF 0.5 03/01/2009 0922   O2SAT 97.4 05/10/2020 1715   CBG (last 3)  No results for input(s): GLUCAP in the last 72 hours.  Assessment/Plan: S/P Procedure(s) (LRB): WOUND VAC CHANGE RIGHT CHEST (Right)  -POD-13 modified Eloesser procedure for recurrent right empyema. He has had several vac changes in the OR with most recent on11/15 which he tolerated with only minimal sedation. Will plan next vac change at the bedside either today or tomorrow.     His home wound vac equipment has been delivered to the unit.   LOS: 13 days    Antony Odea, PA-C [phone] 05/24/2020  Vac changed at bedside, dimensions unchanged  Remo Lipps C. Roxan Hockey, MD [facility] [phone]

## 2020-05-24 NOTE — TOC Transition Note (Signed)
Transition of Care [facility]) - CM/SW Discharge Note   Patient Details  Name: DAMEIAN CRISMAN MRN: [id number] Date of Birth: July 19, 1940  Transition of Care [facility] CM/SW Contact:  Zenon Mayo, RN Phone Number: 05/24/2020, 9:33 AM   Clinical Narrative:    Patient is set up with [facility] for wound vac.  Wound Vac from [facility] is in the patient 's room.  TOC will continue to follow.   Final next level of care: [place] Barriers to Discharge: Continued Medical Work up   Patient Goals and CMS Choice Patient states their goals for this hospitalization and ongoing recovery are:: to get better CMS Medicare.gov Compare Post Acute Care list provided to:: Patient Choice offered to / list presented to : Patient  Discharge Placement                       Discharge Plan and Services   Discharge Planning Services: CM Consult            DME Arranged: Negative pressure wound device DME Agency: [facility] Date DME Agency Contacted: 05/15/20 Time DME Agency Contacted: 1500 Representative spoke with at DME Agency: [place]: RN [place] Agency: [place] ([place]) Date [place]: 05/23/20 Time HH Agency Contacted: 1500 Representative spoke with at [place]: [place] Determinants of Health ([place]) Interventions     Readmission Risk Interventions No flowsheet data found.

## 2020-05-25 DIAGNOSIS — J860 Pyothorax with fistula: Secondary | ICD-10-CM | POA: Diagnosis not present

## 2020-05-25 MED ORDER — SENNOSIDES-DOCUSATE SODIUM 8.6-50 MG PO TABS
1.0000 | ORAL_TABLET | Freq: Every evening | ORAL | Status: DC | PRN
  Administered 2020-05-27: 1 via ORAL
  Filled 2020-05-25: qty 1

## 2020-05-25 MED ORDER — BISACODYL 5 MG PO TBEC
10.0000 mg | DELAYED_RELEASE_TABLET | Freq: Every day | ORAL | Status: DC | PRN
  Administered 2020-05-27 – 2020-05-28 (×2): 10 mg via ORAL
  Filled 2020-05-25 (×2): qty 2

## 2020-05-25 MED ORDER — AMOXICILLIN-POT CLAVULANATE 875-125 MG PO TABS
1.0000 | ORAL_TABLET | Freq: Two times a day (BID) | ORAL | Status: DC
  Administered 2020-05-25 – 2020-05-28 (×7): 1 via ORAL
  Filled 2020-05-25 (×7): qty 1

## 2020-05-25 NOTE — Progress Notes (Signed)
4 Days Post-Op Procedure(s) (LRB): WOUND VAC CHANGE RIGHT CHEST (Right) Subjective: No complaints this AM  Objective: Vital signs in last 24 hours: Temp:  [97.6 F (36.4 C)-98.2 F (36.8 C)] 97.9 F (36.6 C) (11/19 0733) Pulse Rate:  [69-79] 69 (11/19 0426) Cardiac Rhythm: Normal sinus rhythm (11/18 1900) Resp:  [17-25] 25 (11/19 0733) BP: (108-129)/(63-94) 122/72 (11/19 0733) SpO2:  [95 %-98 %] 97 % (11/19 0426)  Hemodynamic parameters for last 24 hours:    Intake/Output from previous day: 11/18 0701 - 11/19 0700 In: 240 [P.O.:240] Out: 950 [Urine:925; Drains:25] Intake/Output this shift: Total I/O In: -  Out: 150 [Urine:150]  Alert and oriented, no distress VAC in place  Lab Results: No results for input(s): WBC, HGB, HCT, PLT in the last 72 hours. BMET: No results for input(s): NA, K, CL, CO2, GLUCOSE, BUN, CREATININE, CALCIUM in the last 72 hours.  PT/INR: No results for input(s): LABPROT, INR in the last 72 hours. ABG    Component Value Date/Time   PHART 7.467 (H) 05/10/2020 1715   HCO3 24.0 05/10/2020 1715   TCO2 25 03/06/2009 0437   ACIDBASEDEF 0.5 03/01/2009 0922   O2SAT 97.4 05/10/2020 1715   CBG (last 3)  No results for input(s): GLUCAP in the last 72 hours.  Assessment/Plan: S/P Procedure(s) (LRB): WOUND VAC CHANGE RIGHT CHEST (Right) -VAC changed at bedside yesterday, tolerated well Has had 2 weeks of IV antibiotics- will change to PO Augmentin Will plan to change VAC at bedside again Monday, if tolerates may be able to DC home with HHRN/ VAC   LOS: 16 days    [name] 05/25/2020

## 2020-05-25 NOTE — Plan of Care (Signed)

## 2020-05-25 NOTE — Progress Notes (Signed)
PROGRESS NOTE    Mathew Hall  VCB:[id number] DOB: 1941-03-04 DOA: 05/09/2020 PCP: [name], MD     Brief Narrative:  Mathew Hall is a 79 year old male with history of recent COVID-19 infection with empyema treated with antibiotics, hypertension, GERD, CAD s/p PCI presents with complaints of worsening back pain.  He was found to have recurrent empyema and failed outpatient treatment.  Underwent thoracotomy on 05/11/2020 with empyema drainage and rib resection as well as application of wound VAC.   New events last 24 hours / Subjective: States that he tolerated bedside wound VAC change yesterday.  Feeling well overall without any complaints.  Assessment & Plan:   Active Problems:   Empyema (HCC)   Protein-calorie malnutrition, severe   Empyema with pleural fistula (HCC)   Recurrent empyema -Status post thoracotomy and drainage of empyema with rib resection, wound VAC application 38/4 -Wound culture negative  -Per CT surgery -Antibiotics deescalated to Augmentin  Essential hypertension -Continue Norvasc -Beta-blocker was discontinued due to bradycardia   CAD -Continue aspirin  History of metastatic non-small cell lung cancer -Seen by Dr. Julien Nordmann 15 years ago, status post neoadjuvant chemotherapy, right middle lobectomy and lymph node dissection    DVT prophylaxis:  enoxaparin (LOVENOX) injection 40 mg Start: 05/14/20 2100 SCD's Start: 05/14/20 1543  Code Status: Full code Family Communication: No family at bedside Disposition Plan:  Status is: Inpatient  Remains inpatient appropriate because:Inpatient level of care appropriate due to severity of illness   Dispo: The patient is from: Home              Anticipated d/c is to: Home              Anticipated d/c date is: 3 days              Patient currently is not medically stable to d/c.  Await discharge clearance from cardiothoracic surgery team.  Planning for another wound VAC change at bedside on  Monday      Consultants:   Cardiothoracic surgery  Critical care  Urology   Antimicrobials:  Anti-infectives (From admission, onward)   Start     Dose/Rate Route Frequency Ordered Stop   05/25/20 1000  amoxicillin-clavulanate (AUGMENTIN) 875-125 MG per tablet 1 tablet        1 tablet Oral Every 12 hours 05/25/20 0804     05/14/20 1315  ampicillin-sulbactam (UNASYN) 1.5 g in sodium chloride 0.9 % 100 mL IVPB  Status:  Discontinued        1.5 g 200 mL/hr over 30 Minutes Intravenous To ShortStay Surgical 05/14/20 1310 05/14/20 1542   05/11/20 0600  vancomycin (VANCOCIN) IVPB 1000 mg/200 mL premix        1,000 mg 200 mL/hr over 60 Minutes Intravenous On call to O.R. 05/10/20 1556 05/12/20 0733   05/10/20 1145  ampicillin-sulbactam (UNASYN) 1.5 g in sodium chloride 0.9 % 100 mL IVPB  Status:  Discontinued        1.5 g 200 mL/hr over 30 Minutes Intravenous Every 6 hours 05/10/20 1058 05/25/20 0804   05/09/20 1330  cefTRIAXone (ROCEPHIN) 1 g in sodium chloride 0.9 % 100 mL IVPB  Status:  Discontinued        1 g 200 mL/hr over 30 Minutes Intravenous Every 24 hours 05/09/20 1321 05/10/20 1053       Objective: Vitals:   05/24/20 1933 05/24/20 2334 05/25/20 0426 05/25/20 0733  BP: (!) 129/94 108/70 126/63 122/72  Pulse: 79 71 69  Resp: 20 20 17  (!) 25  Temp: 98.2 F (36.8 C) 98.1 F (36.7 C) 97.6 F (36.4 C) 97.9 F (36.6 C)  TempSrc: Oral Oral Oral Oral  SpO2: 95% 98% 97%   Weight:      Height:        Intake/Output Summary (Last 24 hours) at 05/25/2020 1021 Last data filed at 05/25/2020 0733 Gross per 24 hour  Intake 240 ml  Output 1100 ml  Net -860 ml   Filed Weights   05/14/20 1313 05/18/20 0339 05/21/20 1133  Weight: 65.9 kg 67.2 kg 67.2 kg    Examination: General exam: Appears calm and comfortable  Respiratory system: Clear to auscultation. Respiratory effort normal.  Right posterior thoracic wound VAC in place Cardiovascular system: S1 & S2 heard, RRR.  No pedal edema. Gastrointestinal system: Abdomen is nondistended, soft and nontender. Normal bowel sounds heard. Central nervous system: Alert and oriented. Non focal exam. Speech clear  Extremities: Symmetric in appearance bilaterally  Skin: No rashes, lesions or ulcers on exposed skin  Psychiatry: Judgement and insight appear stable. Mood & affect appropriate.   Data Reviewed: I have personally reviewed following labs and imaging studies  CBC: Recent Labs  Lab 05/19/20 0107  WBC 5.2  HGB 10.0*  HCT 31.5*  MCV 85.4  PLT 258   Basic Metabolic Panel: Recent Labs  Lab 05/19/20 0107  NA 135  K 4.0  CL 101  CO2 27  GLUCOSE 110*  BUN 17  CREATININE 0.84  CALCIUM 9.2   GFR: Estimated Creatinine Clearance: 68.9 mL/min (by C-G formula based on SCr of 0.84 mg/dL). Liver Function Tests: No results for input(s): AST, ALT, ALKPHOS, BILITOT, PROT, ALBUMIN in the last 168 hours. No results for input(s): LIPASE, AMYLASE in the last 168 hours. No results for input(s): AMMONIA in the last 168 hours. Coagulation Profile: No results for input(s): INR, PROTIME in the last 168 hours. Cardiac Enzymes: No results for input(s): CKTOTAL, CKMB, CKMBINDEX, TROPONINI in the last 168 hours. BNP (last 3 results) No results for input(s): PROBNP in the last 8760 hours. HbA1C: No results for input(s): HGBA1C in the last 72 hours. CBG: No results for input(s): GLUCAP in the last 168 hours. Lipid Profile: No results for input(s): CHOL, HDL, LDLCALC, TRIG, CHOLHDL, LDLDIRECT in the last 72 hours. Thyroid Function Tests: No results for input(s): TSH, T4TOTAL, FREET4, T3FREE, THYROIDAB in the last 72 hours. Anemia Panel: No results for input(s): VITAMINB12, FOLATE, FERRITIN, TIBC, IRON, RETICCTPCT in the last 72 hours. Sepsis Labs: No results for input(s): PROCALCITON, LATICACIDVEN in the last 168 hours.  No results found for this or any previous visit (from the past 240 hour(s)).    Radiology  Studies: No results found.    Scheduled Meds: . amLODipine  10 mg Oral Daily  . amoxicillin-clavulanate  1 tablet Oral Q12H  . aspirin  325 mg Oral Daily  . bisacodyl  10 mg Oral Daily  . Chlorhexidine Gluconate Cloth  6 each Topical Daily  . docusate sodium  100 mg Oral BID  . enoxaparin (LOVENOX) injection  40 mg Subcutaneous Daily  . feeding supplement  237 mL Oral TID BM  . multivitamin with minerals  1 tablet Oral Daily  . pantoprazole  40 mg Oral QAC breakfast  . polyethylene glycol  17 g Oral Daily  . senna-docusate  1 tablet Oral QHS  . Vitamin D (Ergocalciferol)  50,000 Units Oral Weekly   Continuous Infusions:    LOS: 16 days  Time spent: 25 minutes   Dessa Phi, DO [facility] 05/25/2020, 10:21 AM   Available via Epic secure chat 7am-7pm After these hours, please refer to coverage provider listed on [email or web address]

## 2020-05-25 NOTE — Progress Notes (Signed)
Physical Therapy Treatment Patient Details Name: Mathew Hall MRN: [id number] DOB: 09-12-1940 Today's Date: 05/25/2020    History of Present Illness Patient is a 79 y/o male with PMH of recent Covid-19 infection with empyema treated with antibuitics, HTN, CAD s/p PCI presents with back pain. Found to have recurrent empyema and failed OP treatment, s/p thoracotomy with empyema drainage and rib resection and wound vac application 17/61.    PT Comments    Patient progressing well towards PT goals. Reports feeling well today with no pain. Tolerated gait training with supervision for safety, no UE needed. Tolerated stair training today with Min guard-Min A for safety as pt attempting to negotiate steps in an alternating pattern to ascend. 2/4 DOE but VSS on RA. Pt eager to return home before Thanksgiving. Encouraged increasing mobility while in the hospital. Will follow    Follow Up Recommendations  No PT follow up;Supervision/Assistance - 24 hour     Equipment Recommendations  None recommended by PT    Recommendations for Other Services       Precautions / Restrictions Precautions Precautions: Fall Precaution Comments: wound vac Restrictions Weight Bearing Restrictions: No    Mobility  Bed Mobility Overal bed mobility: Independent                Transfers Overall transfer level: Modified independent Equipment used: None             General transfer comment: assist with wound vac only, pt steady, no physical assist needed  Ambulation/Gait Ambulation/Gait assistance: Supervision Gait Distance (Feet): 400 Feet Assistive device: None Gait Pattern/deviations: Step-through pattern;Decreased stride length;Wide base of support   Gait velocity interpretation: 1.31 - 2.62 ft/sec, indicative of limited community ambulator General Gait Details: mostly steady gait without need for DME today; 2/4 DOE. VSS on RA.   Stairs Stairs: Yes Stairs assistance: Min  assist Stair Management: One rail Left;Forwards;Alternating pattern;Step to pattern Number of Stairs: 13 General stair comments: Cues for technique and safety. Performed alternating steps to ascend and step to technique to descend steps. Cues to keep foot completely on step so it doesnt not slip off when descending. DOE.   Wheelchair Mobility    Modified Rankin (Stroke Patients Only)       Balance Overall balance assessment: Mild deficits observed, not formally tested                                          Cognition Arousal/Alertness: Awake/alert Behavior During Therapy: WFL for tasks assessed/performed Overall Cognitive Status: Within Functional Limits for tasks assessed                                        Exercises      General Comments        Pertinent Vitals/Pain Pain Assessment: No/denies pain    Home Living                      Prior Function            PT Goals (current goals can now be found in the care plan section) Progress towards PT goals: Progressing toward goals    Frequency    Min 3X/week      PT Plan Current plan remains appropriate    Co-evaluation  AM-PAC PT "6 Clicks" Mobility   Outcome Measure  Help needed turning from your back to your side while in a flat bed without using bedrails?: None Help needed moving from lying on your back to sitting on the side of a flat bed without using bedrails?: None Help needed moving to and from a bed to a chair (including a wheelchair)?: None Help needed standing up from a chair using your arms (e.g., wheelchair or bedside chair)?: None Help needed to walk in hospital room?: None Help needed climbing 3-5 steps with a railing? : A Little 6 Click Score: 23    End of Session Equipment Utilized During Treatment: Gait belt Activity Tolerance: Patient tolerated treatment well Patient left: in bed;with call bell/phone within  reach Nurse Communication: Mobility status PT Visit Diagnosis: Muscle weakness (generalized) (M62.81)     Time: 1791-5056 PT Time Calculation (min) (ACUTE ONLY): 14 min  Charges:  $Gait Training: 8-22 mins                     Marisa Severin, PT, DPT Acute Rehabilitation Services Pager [phone] Office [phone]       Mathew Hall 05/25/2020, 11:31 AM

## 2020-05-26 DIAGNOSIS — J860 Pyothorax with fistula: Secondary | ICD-10-CM | POA: Diagnosis not present

## 2020-05-26 MED ORDER — LACTULOSE 10 GM/15ML PO SOLN
20.0000 g | Freq: Every day | ORAL | Status: DC | PRN
  Administered 2020-05-27 – 2020-05-28 (×2): 20 g via ORAL
  Filled 2020-05-26 (×2): qty 30

## 2020-05-26 NOTE — Progress Notes (Signed)
PROGRESS NOTE    Mathew Hall  OVZ:[id number] DOB: 1941-05-25 DOA: 05/09/2020 PCP: [name], MD     Brief Narrative:  Mathew Hall is a 79 year old male with history of recent COVID-19 infection with empyema treated with antibiotics, hypertension, GERD, CAD s/p PCI presents with complaints of worsening back pain.  He was found to have recurrent empyema and failed outpatient treatment.  Underwent thoracotomy on 05/11/2020 with empyema drainage and rib resection as well as application of wound VAC.   New events last 24 hours / Subjective: In good spirits, no physical complaints this morning  Assessment & Plan:   Active Problems:   Empyema (HCC)   Protein-calorie malnutrition, severe   Empyema with pleural fistula (HCC)   Recurrent empyema -Status post thoracotomy and drainage of empyema with rib resection, wound VAC application 41/2 -Wound culture negative  -Per CT surgery -Antibiotics deescalated to Augmentin  Essential hypertension -Continue Norvasc -Beta-blocker was discontinued due to bradycardia   CAD -Continue aspirin  History of metastatic non-small cell lung cancer -Seen by Dr. Julien Nordmann 15 years ago, status post neoadjuvant chemotherapy, right middle lobectomy and lymph node dissection    DVT prophylaxis:  enoxaparin (LOVENOX) injection 40 mg Start: 05/14/20 2100 SCD's Start: 05/14/20 1543  Code Status: Full code Family Communication: No family at bedside Disposition Plan:  Status is: Inpatient  Remains inpatient appropriate because:Inpatient level of care appropriate due to severity of illness   Dispo: The patient is from: Home              Anticipated d/c is to: Home              Anticipated d/c date is: 2 days              Patient currently is not medically stable to d/c.  Await discharge clearance from cardiothoracic surgery team.  Planning for another wound VAC change at bedside on Monday with possible discharge home      Consultants:    Cardiothoracic surgery  Critical care  Urology   Antimicrobials:  Anti-infectives (From admission, onward)   Start     Dose/Rate Route Frequency Ordered Stop   05/25/20 1000  amoxicillin-clavulanate (AUGMENTIN) 875-125 MG per tablet 1 tablet        1 tablet Oral Every 12 hours 05/25/20 0804     05/14/20 1315  ampicillin-sulbactam (UNASYN) 1.5 g in sodium chloride 0.9 % 100 mL IVPB  Status:  Discontinued        1.5 g 200 mL/hr over 30 Minutes Intravenous To ShortStay Surgical 05/14/20 1310 05/14/20 1542   05/11/20 0600  vancomycin (VANCOCIN) IVPB 1000 mg/200 mL premix        1,000 mg 200 mL/hr over 60 Minutes Intravenous On call to O.R. 05/10/20 1556 05/12/20 0733   05/10/20 1145  ampicillin-sulbactam (UNASYN) 1.5 g in sodium chloride 0.9 % 100 mL IVPB  Status:  Discontinued        1.5 g 200 mL/hr over 30 Minutes Intravenous Every 6 hours 05/10/20 1058 05/25/20 0804   05/09/20 1330  cefTRIAXone (ROCEPHIN) 1 g in sodium chloride 0.9 % 100 mL IVPB  Status:  Discontinued        1 g 200 mL/hr over 30 Minutes Intravenous Every 24 hours 05/09/20 1321 05/10/20 1053       Objective: Vitals:   05/25/20 1937 05/26/20 0013 05/26/20 0345 05/26/20 0802  BP: 121/63 (!) 111/57 112/69 124/70  Pulse: 74 76 65 69  Resp: 20 16  18 20  Temp: 97.8 F (36.6 C) 97.7 F (36.5 C) 97.8 F (36.6 C) 97.6 F (36.4 C)  TempSrc: Oral Oral Oral Oral  SpO2: 98% 100% 97%   Weight:      Height:        Intake/Output Summary (Last 24 hours) at 05/26/2020 1035 Last data filed at 05/26/2020 0803 Gross per 24 hour  Intake 240 ml  Output 1250 ml  Net -1010 ml   Filed Weights   05/14/20 1313 05/18/20 0339 05/21/20 1133  Weight: 65.9 kg 67.2 kg 67.2 kg    Examination: General exam: Appears calm and comfortable  Respiratory system: Clear to auscultation. Respiratory effort normal. Cardiovascular system: S1 & S2 heard, RRR. No pedal edema. Gastrointestinal system: Abdomen is nondistended, soft and  nontender. Normal bowel sounds heard. Central nervous system: Alert and oriented. Non focal exam. Speech clear  Extremities: Symmetric in appearance bilaterally  Skin: No rashes, lesions or ulcers on exposed skin  Psychiatry: Judgement and insight appear stable. Mood & affect appropriate.    Data Reviewed: I have personally reviewed following labs and imaging studies  CBC: No results for input(s): WBC, NEUTROABS, HGB, HCT, MCV, PLT in the last 168 hours. Basic Metabolic Panel: No results for input(s): NA, K, CL, CO2, GLUCOSE, BUN, CREATININE, CALCIUM, MG, PHOS in the last 168 hours. GFR: Estimated Creatinine Clearance: 68.9 mL/min (by C-G formula based on SCr of 0.84 mg/dL). Liver Function Tests: No results for input(s): AST, ALT, ALKPHOS, BILITOT, PROT, ALBUMIN in the last 168 hours. No results for input(s): LIPASE, AMYLASE in the last 168 hours. No results for input(s): AMMONIA in the last 168 hours. Coagulation Profile: No results for input(s): INR, PROTIME in the last 168 hours. Cardiac Enzymes: No results for input(s): CKTOTAL, CKMB, CKMBINDEX, TROPONINI in the last 168 hours. BNP (last 3 results) No results for input(s): PROBNP in the last 8760 hours. HbA1C: No results for input(s): HGBA1C in the last 72 hours. CBG: No results for input(s): GLUCAP in the last 168 hours. Lipid Profile: No results for input(s): CHOL, HDL, LDLCALC, TRIG, CHOLHDL, LDLDIRECT in the last 72 hours. Thyroid Function Tests: No results for input(s): TSH, T4TOTAL, FREET4, T3FREE, THYROIDAB in the last 72 hours. Anemia Panel: No results for input(s): VITAMINB12, FOLATE, FERRITIN, TIBC, IRON, RETICCTPCT in the last 72 hours. Sepsis Labs: No results for input(s): PROCALCITON, LATICACIDVEN in the last 168 hours.  No results found for this or any previous visit (from the past 240 hour(s)).    Radiology Studies: No results found.    Scheduled Meds: . amLODipine  10 mg Oral Daily  .  amoxicillin-clavulanate  1 tablet Oral Q12H  . aspirin  325 mg Oral Daily  . Chlorhexidine Gluconate Cloth  6 each Topical Daily  . docusate sodium  100 mg Oral BID  . enoxaparin (LOVENOX) injection  40 mg Subcutaneous Daily  . feeding supplement  237 mL Oral TID BM  . multivitamin with minerals  1 tablet Oral Daily  . pantoprazole  40 mg Oral QAC breakfast  . polyethylene glycol  17 g Oral Daily  . Vitamin D (Ergocalciferol)  50,000 Units Oral Weekly   Continuous Infusions:    LOS: 17 days      Time spent: 25 minutes   Dessa Phi, DO [facility] 05/26/2020, 10:35 AM   Available via Epic secure chat 7am-7pm After these hours, please refer to coverage provider listed on [email or web address]

## 2020-05-26 NOTE — Progress Notes (Signed)
5 Days Post-Op Procedure(s) (LRB): WOUND VAC CHANGE RIGHT CHEST (Right) Subjective: No complaints this AM  Objective: Vital signs in last 24 hours: Temp:  [97.6 F (36.4 C)-97.8 F (36.6 C)] 97.6 F (36.4 C) (11/20 0802) Pulse Rate:  [65-76] 69 (11/20 0802) Cardiac Rhythm: Normal sinus rhythm;Bundle branch block (11/20 0713) Resp:  [16-20] 20 (11/20 0802) BP: (111-124)/(57-70) 124/70 (11/20 0802) SpO2:  [97 %-100 %] 97 % (11/20 0345)  Hemodynamic parameters for last 24 hours:    Intake/Output from previous day: 11/19 0701 - 11/20 0700 In: -  Out: 1100 [Urine:1075; Drains:25] Intake/Output this shift: Total I/O In: 240 [P.O.:240] Out: 300 [Urine:300]  Alert and oriented, no distress VAC in place  Lab Results: No results for input(s): WBC, HGB, HCT, PLT in the last 72 hours. BMET: No results for input(s): NA, K, CL, CO2, GLUCOSE, BUN, CREATININE, CALCIUM in the last 72 hours.  PT/INR: No results for input(s): LABPROT, INR in the last 72 hours. ABG    Component Value Date/Time   PHART 7.467 (H) 05/10/2020 1715   HCO3 24.0 05/10/2020 1715   TCO2 25 03/06/2009 0437   ACIDBASEDEF 0.5 03/01/2009 0922   O2SAT 97.4 05/10/2020 1715   CBG (last 3)  No results for input(s): GLUCAP in the last 72 hours.  Assessment/Plan: S/P Procedure(s) (LRB): WOUND VAC CHANGE RIGHT CHEST (Right) -VAC changed at bedside yesterday, tolerated well Has had 2 weeks of IV antibiotics- will change to PO Augmentin Will plan to change VAC at bedside again Monday, if tolerates may be able to DC home with HHRN/ VAC   LOS: 17 days    Lajuana Matte 05/26/2020

## 2020-05-27 DIAGNOSIS — J860 Pyothorax with fistula: Secondary | ICD-10-CM | POA: Diagnosis not present

## 2020-05-27 NOTE — Progress Notes (Signed)
PROGRESS NOTE    Mathew Hall  HAL:[id number] DOB: 1941/01/08 DOA: 05/09/2020 PCP: [name], MD     Brief Narrative:  Mathew Hall is a 79 year old male with history of recent COVID-19 infection with empyema treated with antibiotics, hypertension, GERD, CAD s/p PCI presents with complaints of worsening back pain.  He was found to have recurrent empyema and failed outpatient treatment.  Underwent thoracotomy on 05/11/2020 with empyema drainage and rib resection as well as application of wound VAC.   New events last 24 hours / Subjective: No complaints today  Assessment & Plan:   Active Problems:   Empyema (HCC)   Protein-calorie malnutrition, severe   Empyema with pleural fistula (HCC)   Recurrent empyema -Status post thoracotomy and drainage of empyema with rib resection, wound VAC application 73/5 -Wound culture negative  -Per CT surgery -Antibiotics deescalated to Augmentin  Essential hypertension -Continue Norvasc -Beta-blocker was discontinued due to bradycardia   CAD -Continue aspirin  History of metastatic non-small cell lung cancer -Seen by Dr. Julien Nordmann 15 years ago, status post neoadjuvant chemotherapy, right middle lobectomy and lymph node dissection    DVT prophylaxis:  enoxaparin (LOVENOX) injection 40 mg Start: 05/14/20 2100 SCD's Start: 05/14/20 1543  Code Status: Full code Family Communication: No family at bedside Disposition Plan:  Status is: Inpatient  Remains inpatient appropriate because:Inpatient level of care appropriate due to severity of illness   Dispo: The patient is from: Home              Anticipated d/c is to: Home              Anticipated d/c date is: 1 day              Patient currently is not medically stable to d/c.  Await discharge clearance from cardiothoracic surgery team.  Planning for another wound VAC change at bedside on tomorrow with possible discharge home      Consultants:   Cardiothoracic  surgery  Critical care  Urology   Antimicrobials:  Anti-infectives (From admission, onward)   Start     Dose/Rate Route Frequency Ordered Stop   05/25/20 1000  amoxicillin-clavulanate (AUGMENTIN) 875-125 MG per tablet 1 tablet        1 tablet Oral Every 12 hours 05/25/20 0804     05/14/20 1315  ampicillin-sulbactam (UNASYN) 1.5 g in sodium chloride 0.9 % 100 mL IVPB  Status:  Discontinued        1.5 g 200 mL/hr over 30 Minutes Intravenous To ShortStay Surgical 05/14/20 1310 05/14/20 1542   05/11/20 0600  vancomycin (VANCOCIN) IVPB 1000 mg/200 mL premix        1,000 mg 200 mL/hr over 60 Minutes Intravenous On call to O.R. 05/10/20 1556 05/12/20 0733   05/10/20 1145  ampicillin-sulbactam (UNASYN) 1.5 g in sodium chloride 0.9 % 100 mL IVPB  Status:  Discontinued        1.5 g 200 mL/hr over 30 Minutes Intravenous Every 6 hours 05/10/20 1058 05/25/20 0804   05/09/20 1330  cefTRIAXone (ROCEPHIN) 1 g in sodium chloride 0.9 % 100 mL IVPB  Status:  Discontinued        1 g 200 mL/hr over 30 Minutes Intravenous Every 24 hours 05/09/20 1321 05/10/20 1053       Objective: Vitals:   05/26/20 1951 05/26/20 2353 05/27/20 0321 05/27/20 0711  BP:  129/72 106/67 125/75  Pulse: 74 75 68 68  Resp: 18 18 20 20   Temp: 98.1 F (  36.7 C) 97.6 F (36.4 C) 97.6 F (36.4 C) (!) 97.4 F (36.3 C)  TempSrc: Oral Oral Oral Oral  SpO2:  100% 98% 98%  Weight:      Height:        Intake/Output Summary (Last 24 hours) at 05/27/2020 1155 Last data filed at 05/27/2020 0800 Gross per 24 hour  Intake 837 ml  Output 1270 ml  Net -433 ml   Filed Weights   05/14/20 1313 05/18/20 0339 05/21/20 1133  Weight: 65.9 kg 67.2 kg 67.2 kg    Examination: General exam: Appears calm and comfortable  Respiratory system: Clear to auscultation. Respiratory effort normal. Cardiovascular system: S1 & S2 heard, RRR. No pedal edema. Gastrointestinal system: Abdomen is nondistended, soft and nontender. Normal bowel  sounds heard. Central nervous system: Alert and oriented. Non focal exam. Speech clear  Extremities: Symmetric in appearance bilaterally  Skin: No rashes, lesions or ulcers on exposed skin  Psychiatry: Judgement and insight appear stable. Mood & affect appropriate.   Data Reviewed: I have personally reviewed following labs and imaging studies  CBC: No results for input(s): WBC, NEUTROABS, HGB, HCT, MCV, PLT in the last 168 hours. Basic Metabolic Panel: No results for input(s): NA, K, CL, CO2, GLUCOSE, BUN, CREATININE, CALCIUM, MG, PHOS in the last 168 hours. GFR: Estimated Creatinine Clearance: 68.9 mL/min (by C-G formula based on SCr of 0.84 mg/dL). Liver Function Tests: No results for input(s): AST, ALT, ALKPHOS, BILITOT, PROT, ALBUMIN in the last 168 hours. No results for input(s): LIPASE, AMYLASE in the last 168 hours. No results for input(s): AMMONIA in the last 168 hours. Coagulation Profile: No results for input(s): INR, PROTIME in the last 168 hours. Cardiac Enzymes: No results for input(s): CKTOTAL, CKMB, CKMBINDEX, TROPONINI in the last 168 hours. BNP (last 3 results) No results for input(s): PROBNP in the last 8760 hours. HbA1C: No results for input(s): HGBA1C in the last 72 hours. CBG: No results for input(s): GLUCAP in the last 168 hours. Lipid Profile: No results for input(s): CHOL, HDL, LDLCALC, TRIG, CHOLHDL, LDLDIRECT in the last 72 hours. Thyroid Function Tests: No results for input(s): TSH, T4TOTAL, FREET4, T3FREE, THYROIDAB in the last 72 hours. Anemia Panel: No results for input(s): VITAMINB12, FOLATE, FERRITIN, TIBC, IRON, RETICCTPCT in the last 72 hours. Sepsis Labs: No results for input(s): PROCALCITON, LATICACIDVEN in the last 168 hours.  No results found for this or any previous visit (from the past 240 hour(s)).    Radiology Studies: No results found.    Scheduled Meds: . amLODipine  10 mg Oral Daily  . amoxicillin-clavulanate  1 tablet Oral  Q12H  . aspirin  325 mg Oral Daily  . Chlorhexidine Gluconate Cloth  6 each Topical Daily  . docusate sodium  100 mg Oral BID  . enoxaparin (LOVENOX) injection  40 mg Subcutaneous Daily  . feeding supplement  237 mL Oral TID BM  . multivitamin with minerals  1 tablet Oral Daily  . pantoprazole  40 mg Oral QAC breakfast  . polyethylene glycol  17 g Oral Daily  . Vitamin D (Ergocalciferol)  50,000 Units Oral Weekly   Continuous Infusions:    LOS: 18 days      Time spent: 20 minutes   Dessa Phi, DO [facility] 05/27/2020, 11:55 AM   Available via Epic secure chat 7am-7pm After these hours, please refer to coverage provider listed on [email or web address]

## 2020-05-27 NOTE — Progress Notes (Signed)
6 Days Post-Op Procedure(s) (LRB): WOUND VAC CHANGE RIGHT CHEST (Right) Subjective: No complaints this AM  Objective: Vital signs in last 24 hours: Temp:  [97.4 F (36.3 C)-98.1 F (36.7 C)] 97.4 F (36.3 C) (11/21 0711) Pulse Rate:  [68-75] 68 (11/21 0711) Cardiac Rhythm: Normal sinus rhythm (11/21 0716) Resp:  [18-20] 20 (11/21 0711) BP: (106-129)/(63-75) 125/75 (11/21 0711) SpO2:  [98 %-100 %] 98 % (11/21 0711)  Hemodynamic parameters for last 24 hours:    Intake/Output from previous day: 11/20 0701 - 11/21 0700 In: 837 [P.O.:837] Out: 1570 [Urine:1550; Drains:20] Intake/Output this shift: Total I/O In: 240 [P.O.:240] Out: -   Alert and oriented, no distress VAC in place  Lab Results: No results for input(s): WBC, HGB, HCT, PLT in the last 72 hours. BMET: No results for input(s): NA, K, CL, CO2, GLUCOSE, BUN, CREATININE, CALCIUM in the last 72 hours.  PT/INR: No results for input(s): LABPROT, INR in the last 72 hours. ABG    Component Value Date/Time   PHART 7.467 (H) 05/10/2020 1715   HCO3 24.0 05/10/2020 1715   TCO2 25 03/06/2009 0437   ACIDBASEDEF 0.5 03/01/2009 0922   O2SAT 97.4 05/10/2020 1715   CBG (last 3)  No results for input(s): GLUCAP in the last 72 hours.  Assessment/Plan: S/P Procedure(s) (LRB): WOUND VAC CHANGE RIGHT CHEST (Right) -VAC changed at bedside yesterday, tolerated well Has had 2 weeks of IV antibiotics- will change to PO Augmentin Will plan to change VAC at bedside again Monday, if tolerates may be able to DC home with HHRN/ VAC   LOS: 18 days    Lajuana Matte 05/27/2020

## 2020-05-28 MED ORDER — AMLODIPINE BESYLATE 10 MG PO TABS: 10.0000 mg | ORAL_TABLET | Freq: Every day | ORAL | 2 refills | Status: AC

## 2020-05-28 MED ORDER — AMOXICILLIN-POT CLAVULANATE 875-125 MG PO TABS: 1.0000 | ORAL_TABLET | Freq: Two times a day (BID) | ORAL | 0 refills | Status: DC

## 2020-05-28 NOTE — Progress Notes (Signed)
PT Cancellation Note  Patient Details Name: Mathew Hall MRN: [id number] DOB: 04/07/1941   Cancelled Treatment:    Reason Eval/Treat Not Completed: Other (comment) Noted pt with discharge orders.  Spoke with pt who decline PT at this time due to discharging shortly.  Reports he is walking well and feels confident in his mobility to return home. Pt with no questions for PT.  Noted pt has been ambulating 400' with supervision.  Abran Richard, PT Acute Rehab Services Pager [phone] [facility] 05/28/2020, 3:20 PM

## 2020-05-28 NOTE — Plan of Care (Signed)
  Problem: Education: Goal: Knowledge of General Education information will improve Description: Including pain rating scale, medication(s)/side effects and non-pharmacologic comfort measures Outcome: Progressing   Problem: Skin Integrity: Goal: Risk for impaired skin integrity will decrease Outcome: Progressing   Problem: Safety: Goal: Ability to remain free from injury will improve Outcome: Progressing   Problem: Pain Managment: Goal: General experience of comfort will improve Outcome: Progressing   Problem: Elimination: Goal: Will not experience complications related to bowel motility Outcome: Progressing

## 2020-05-28 NOTE — Progress Notes (Signed)
      [address]       [place] [address]             [phone]      No complaints  BP (!) 143/84   Pulse 70   Temp (!) 97.5 F (36.4 C) (Oral)   Resp 19   Ht 5' 7.99" (1.727 m)   Wt 67.2 kg   SpO2 97%   BMI 22.53 kg/m   WBC 5.2 K  Changed VAC dressing at bedside. Wound looks really good, granulating nicely. Tolerated VAC change well. Dimensions 9 cm long x 4 cm wide x 8 cm deep, internal cavity ~5 cm diameter.   OK for dc form my standpoint.  Would have HHRN change superficial VAC sponge on Friday I'll set him up to come to the office next Monday to change the deep sponge.  He will need small regular VAC sponges and "white" VAC sponges at home Would continue PO antbiotics at least another 2 weeks   Remo Lipps C. Roxan Hockey, MD [facility] [phone]

## 2020-05-28 NOTE — Progress Notes (Signed)
Nutrition Follow-up  DOCUMENTATION CODES:   Severe malnutrition in context of acute illness/injury  INTERVENTION:   - Recommend increasing bowel regimen as pt's last BM was on 11/16  - Continue Ensure Enlive po TID, each supplement provides 350 kcal and 20 grams of protein  - MVI with minerals daily  - Continue with Regular diet  NUTRITION DIAGNOSIS:   Severe Malnutrition related to acute illness (recurrent rt empyema) as evidenced by percent weight loss, moderate fat depletion, severe fat depletion, severe muscle depletion, moderate muscle depletion.  Ongoing  GOAL:   Patient will meet greater than or equal to 90% of their needs  Progressing  MONITOR:   PO intake, Supplement acceptance, Labs, Weight trends, Skin, I & O's  REASON FOR ASSESSMENT:   Malnutrition Screening Tool    ASSESSMENT:   Mathew Hall is a 79 y.o. male with medical history significant of right-sided empyema recently September 16, remote lung CA on right side, recent COVID infection in September 2021, hypertension, GERD, presented with recurrent right-sided empyema.  Pt admitted with recurrent right sided empyema.  11/05 - s/p thoracotomy, empyema drainage, rib resection, application of wound VAC 11/08 - wound VAC change in OR 11/11 - wound VAC change in OR 11/15 - wound VAC change in OR 11/18 - wound VAC change at bedside  Spoke with pt at bedside. Pt reports appetite is good and that he is eating well. He is glad to be on a Regular diet and have more food choices. Pt reports that he drinks about 2 Ensure supplements daily. Encouraged pt to continue drinking oral nutrition supplements at discharge. Pt states that his wife has some Ensure at home.  Weight stable.  Current weight: 67.2 kg Admit weight: 66.5 kg  Meal Completion: 50-100% (averaging 81%)  Medications reviewed and include: colace, Ensure Enlive TID, MVI with minerals daily, protonix, miralax, vitamin D weekly  Labs  reviewed.  Diet Order:   Diet Order            Diet regular Room service appropriate? Yes; Fluid consistency: Thin  Diet effective now                 EDUCATION NEEDS:   Education needs have been addressed  Skin:  Skin Assessment: Skin Integrity Issues: Wound VAC: chest Incisions: chest  Last BM:  05/22/20  Height:   Ht Readings from Last 1 Encounters:  05/21/20 5' 7.99" (1.727 m)    Weight:   Wt Readings from Last 1 Encounters:  05/21/20 67.2 kg    Ideal Body Weight:  70 kg  BMI:  Body mass index is 22.53 kg/m.  Estimated Nutritional Needs:   Kcal:  2100-2300  Protein:  115-130 grams  Fluid:  > 2 L    Gustavus Bryant, MS, RD, LDN Inpatient Clinical Dietitian Please see AMiON for contact information.

## 2020-05-29 ENCOUNTER — Telehealth: Payer: Self-pay

## 2020-05-29 NOTE — Discharge Summary (Signed)
Physician Discharge Summary  Mathew Hall VXB:[id number] DOB: Feb 11, 1941 DOA: 05/09/2020  PCP: [name], MD  Admit date: 05/09/2020 Discharge date: 05/29/2020  Admitted From: Home Disposition:  Home  Recommendations for Outpatient Follow-up:  Follow up with Cardiothoracic surgery  Discharge Condition: Stable CODE STATUS: Full  Diet recommendation: Regular  Brief/Interim Summary: From H&P by Dr. Roosevelt Locks: "Mathew Hall is a 79 y.o. male with medical history significant of right-sided empyema recently September 16, remote lung CA on right side, recent COVID infection in September 2021, hypertension, GERD, presented with recurrent right-sided empyema.  Patient was hospitalized last month for right-sided empyema, status post chest tube, which was pulled out on 9/24.  Culture showed Streptococcus intermedius pansensitive, patient was treated with 4 days of Rocephin then discharged home with Keflex for 3 more weeks which completed about 2-3 weeks ago.  About 2 weeks ago patient woke up 1 day feeling was " lump" developed on the right sided lower back, where the chest tube was placed.  Denied any fever chills shortness of breath or cough.  He contacted his pulmonologist who saw him this Monday and CT chest yesterday showed recurrent right-sided empyema.  Pulmonology recommend patient admitted to hospital for treatment of recurrent empyema failed outpatient treatment."  Interim: patient eventually underwent thoracotomy on 05/11/2020 with empyema drainage and rib resection as well as application of wound VAC. He was treated with IV antibiotics and deescalated to Augmentin prior to discharge.   Discharge Diagnoses:  Active Problems:   Empyema (HCC)   Protein-calorie malnutrition, severe   Empyema with pleural fistula (HCC)  Recurrent empyema -Status post thoracotomy and drainage of empyema with rib resection, wound VAC application 33/0 -Wound culture negative  -Per CT  surgery -Antibiotics deescalated to Augmentin  Essential hypertension -Continue Norvasc -Beta-blocker was discontinued due to bradycardia   CAD -Continue aspirin  History of metastatic non-small cell lung cancer -Seen by Dr. Julien Nordmann 15 years ago, status post neoadjuvant chemotherapy, right middle lobectomy and lymph node dissection   Discharge Instructions  Discharge Instructions    Call MD for:  difficulty breathing, headache or visual disturbances   Complete by: As directed    Call MD for:  extreme fatigue   Complete by: As directed    Call MD for:  persistant dizziness or light-headedness   Complete by: As directed    Call MD for:  persistant nausea and vomiting   Complete by: As directed    Call MD for:  severe uncontrolled pain   Complete by: As directed    Call MD for:  temperature >100.4   Complete by: As directed    Discharge instructions   Complete by: As directed    You were cared for by a hospitalist during your hospital stay. If you have any questions about your discharge medications or the care you received while you were in the hospital after you are discharged, you can call the unit and ask to speak with the hospitalist on call if the hospitalist that took care of you is not available. Once you are discharged, your primary care physician will handle any further medical issues. Please note that NO REFILLS for any discharge medications will be authorized once you are discharged, as it is imperative that you return to your primary care physician (or establish a relationship with a primary care physician if you do not have one) for your aftercare needs so that they can reassess your need for medications and monitor your lab values.  Increase activity slowly   Complete by: As directed    No wound care   Complete by: As directed      Allergies as of 05/28/2020      Reactions   Lipitor [atorvastatin Calcium] Other (See Comments)   Knee pain   Atorvastatin Other  (See Comments)   Joint/knee pain      Medication List    STOP taking these medications   Klor-Con M20 20 MEQ tablet Generic drug: potassium chloride SA   metoprolol tartrate 25 MG tablet Commonly known as: LOPRESSOR     TAKE these medications   amLODipine 10 MG tablet Commonly known as: NORVASC Take 1 tablet (10 mg total) by mouth daily. What changed:   medication strength  how much to take   amoxicillin-clavulanate 875-125 MG tablet Commonly known as: AUGMENTIN Take 1 tablet by mouth every 12 (twelve) hours for 14 days.   aspirin 325 MG EC tablet Take 1 tablet (325 mg total) by mouth daily. Please hold for 15 days when you are on Eliquis, resume on 10/11 once you are off Eliquis.   ergocalciferol 1.25 MG (50000 UT) capsule Commonly known as: VITAMIN D2 Take 50,000 Units by mouth once a week. Friday   esomeprazole 40 MG capsule Commonly known as: NEXIUM Take 40 mg by mouth daily at 12 noon.   nitroGLYCERIN 0.4 MG SL tablet Commonly known as: NITROSTAT Place 1 tablet (0.4 mg total) under the tongue every 5 (five) minutes as needed for chest pain.   oxybutynin 5 MG tablet Commonly known as: DITROPAN Take 5 mg by mouth every 8 (eight) hours as needed for bladder spasms.   phenylephrine 10 MG Tabs tablet Commonly known as: SUDAFED PE Take 10 mg by mouth every 4 (four) hours as needed (sinus congestion).   PRENATAL 1 + IRON PO Take 1 tablet by mouth daily.       Follow-up Information    [name], MD. Schedule an appointment as soon as possible for a visit.   Specialty: Internal Medicine Contact information: [place] [address] [place], [facility].   Specialty: Home Health Services Why: [facility] [phone]       [name], MD Follow up.   Specialty: Cardiothoracic Surgery Contact information: [address] [place]  [address] [phone]               Allergies  Allergen Reactions  . Lipitor [Atorvastatin Calcium] Other (See Comments)    Knee pain   . Atorvastatin Other (See Comments)    Joint/knee pain     Procedures/Studies: DG Chest 2 View  Result Date: 05/07/2020 CLINICAL DATA:  Follow-up empyema EXAM: CHEST - 2 VIEW COMPARISON:  04/23/2020 FINDINGS: Normal heart size. Postsurgical changes in volume loss are again noted within the right hemithorax. Loculated right pleural effusion is again noted and appears unchanged from 04/23/2020. No left pleural effusion. No acute airspace densities. Visualized osseous structures are unremarkable. IMPRESSION: Unchanged loculated right pleural effusion. Electronically Signed   By: Kerby Moors M.D.   On: 05/07/2020 12:15   CT Chest W Contrast  Result Date: 05/10/2020 CLINICAL DATA:  Chest wall soft tissue mass posteriorly on the right with spontaneous hemorrhage. Recent right posterior chest tube. EXAM: CT CHEST WITH CONTRAST TECHNIQUE: Multidetector CT imaging of the chest was performed during intravenous contrast administration. CONTRAST:  49mL ISOVUE-300 IOPAMIDOL (ISOVUE-300) INJECTION 61% COMPARISON:  Chest CT dated 03/29/2020. Chest radiographs dated 05/07/2020. FINDINGS: Cardiovascular: Atheromatous calcifications, including the coronary arteries and aorta. Normal sized heart. Mediastinum/Nodes: Small hiatal hernia. The previously demonstrated stable 13 mm short axis precarinal lymph node has a short axis diameter of 8 mm today on image number 65 series 2, with a normal fatty hilum. No enlarged mediastinal, hilar, or axillary lymph nodes. Thyroid gland, trachea, and esophagus demonstrate no significant findings. Lungs/Pleura: Loculated right pleural fluid posteriorly with containing gas or air. This represents re-accumulation of fluid at the location of the previously located chest tube. This measures 9.3 cm in length on coronal image number 135 series 5 and 7.8 x 7.4 cm on axial image  number 111 series 2. There is a mildly thickened surrounding rim of probably enhancing soft tissue as well as adjacent right lower lobe atelectasis. This has a lateral extension into the right posterolateral chest wall with a heterogeneous area of soft tissue thickening and small fluid collections measuring 5.7 cm in AP dimension on axial image number 108 series 2, 6.2 cm in length and 0.5 cm in width on coronal image number 123 series 5, with extensive adjacent soft tissue edema and stranding. Mild linear scarring in the right lower lung zone. Clear left lung. No pneumothorax. Upper Abdomen: Previously demonstrated small gallstone in the gallbladder measuring 6 mm in diameter. No gallbladder wall thickening or pericholecystic fluid. Tiny left renal cyst. Musculoskeletal: Extensive thoracic spine degenerative changes. IMPRESSION: 1. Interval loculated pleural fluid posteriorly on the right with a surrounding rim of probably enhancing soft tissue and containing gas or air, suspicious for empyema. 2. The loculated right posterior pleural fluid collection has a lateral extension into the right posterolateral chest wall at the location of the entry tract of the previously demonstrated right chest tube with a heterogeneous area of soft tissue thickening and small fluid collections with extensive adjacent soft tissue edema and stranding. This is compatible with an area of infection with probable small abscesses. 3. Small hiatal hernia. 4. Cholelithiasis. 5. Calcific coronary artery and aortic atherosclerosis. These results will be called to the ordering clinician or representative by the Radiologist Assistant, and communication documented in the PACS or [employer]. Aortic Atherosclerosis (ICD10-I70.0). Electronically Signed   By: Claudie Revering M.D.   On: 05/10/2020 09:57   DG Chest Port 1 View  Result Date: 05/15/2020 CLINICAL DATA:  History of empyema.  Short of breath EXAM: PORTABLE CHEST 1 VIEW COMPARISON:   05/12/2020 FINDINGS: Postsurgical changes right lung base. Mild improvement in right lower lobe airspace disease. No effusion. Left lung clear.  Negative for heart failure or pleural effusion IMPRESSION: Interval improvement in right lower lobe aeration with decreased airspace disease. No pleural effusion. Electronically Signed   By: Franchot Gallo M.D.   On: 05/15/2020 07:57   DG CHEST PORT 1 VIEW  Result Date: 05/12/2020 CLINICAL DATA:  Status post drainage of right empyema. EXAM: PORTABLE CHEST 1 VIEW COMPARISON:  05/11/2020 FINDINGS: Normal heart size. No pneumothorax identified. Stable postsurgical changes within the right inferior hemithorax. Subsegmental atelectasis noted within the left base. No interstitial edema. No airspace opacities. IMPRESSION: Stable postsurgical changes involving the inferior right hemithorax. No pneumothorax identified. Subsegmental atelectasis noted in the left base. Electronically Signed   By: Kerby Moors M.D.   On: 05/12/2020 08:30   DG CHEST PORT 1 VIEW  Result Date: 05/11/2020 CLINICAL DATA:  Postop check EXAM: PORTABLE CHEST 1 VIEW COMPARISON:  Portable exam 1317 hours compared to 05/07/2020 FINDINGS:  Normal heart size, mediastinal contours, and pulmonary vascularity. Slightly rotated to the RIGHT. Postsurgical changes at inferior RIGHT hemithorax. Remaining lungs clear. No pleural effusion or pneumothorax. Bones demineralized. IMPRESSION: Postsurgical changes of the inferior RIGHT chest, stable. No acute abnormalities. Electronically Signed   By: Lavonia Dana M.D.   On: 05/11/2020 14:50        Discharge Exam: Vitals:   05/28/20 1125 05/28/20 1710  BP: (!) 143/84 136/86  Pulse:  72  Resp:  16  Temp:  98 F (36.7 C)  SpO2:  98%    General: Pt is alert, awake, not in acute distress Cardiovascular: RRR, S1/S2 +, no edema Respiratory: CTA bilaterally, no wheezing, no rhonchi, no respiratory distress, no conversational dyspnea  Abdominal: Soft, NT, ND,  bowel sounds + Extremities: no edema, no cyanosis Psych: Normal mood and affect, stable judgement and insight     The results of significant diagnostics from this hospitalization (including imaging, microbiology, ancillary and laboratory) are listed below for reference.     Microbiology: No results found for this or any previous visit (from the past 240 hour(s)).   Labs: BNP (last 3 results) Recent Labs    03/28/20 0830 03/29/20 0033 03/30/20 1054  BNP 67.4 50.5 08.8   Basic Metabolic Panel: No results for input(s): NA, K, CL, CO2, GLUCOSE, BUN, CREATININE, CALCIUM, MG, PHOS in the last 168 hours. Liver Function Tests: No results for input(s): AST, ALT, ALKPHOS, BILITOT, PROT, ALBUMIN in the last 168 hours. No results for input(s): LIPASE, AMYLASE in the last 168 hours. No results for input(s): AMMONIA in the last 168 hours. CBC: No results for input(s): WBC, NEUTROABS, HGB, HCT, MCV, PLT in the last 168 hours. Cardiac Enzymes: No results for input(s): CKTOTAL, CKMB, CKMBINDEX, TROPONINI in the last 168 hours. BNP: Invalid input(s): POCBNP CBG: No results for input(s): GLUCAP in the last 168 hours. D-Dimer No results for input(s): DDIMER in the last 72 hours. Hgb A1c No results for input(s): HGBA1C in the last 72 hours. Lipid Profile No results for input(s): CHOL, HDL, LDLCALC, TRIG, CHOLHDL, LDLDIRECT in the last 72 hours. Thyroid function studies No results for input(s): TSH, T4TOTAL, T3FREE, THYROIDAB in the last 72 hours.  Invalid input(s): FREET3 Anemia work up No results for input(s): VITAMINB12, FOLATE, FERRITIN, TIBC, IRON, RETICCTPCT in the last 72 hours. Urinalysis    Component Value Date/Time   COLORURINE YELLOW 05/10/2020 1635   APPEARANCEUR CLEAR 05/10/2020 1635   LABSPEC 1.021 05/10/2020 1635   PHURINE 6.0 05/10/2020 1635   GLUCOSEU NEGATIVE 05/10/2020 1635   HGBUR NEGATIVE 05/10/2020 1635   BILIRUBINUR NEGATIVE 05/10/2020 1635   KETONESUR  NEGATIVE 05/10/2020 1635   PROTEINUR NEGATIVE 05/10/2020 1635   UROBILINOGEN 0.2 03/01/2009 0923   NITRITE NEGATIVE 05/10/2020 1635   LEUKOCYTESUR NEGATIVE 05/10/2020 1635   Sepsis Labs Invalid input(s): PROCALCITONIN,  WBC,  LACTICIDVEN Microbiology No results found for this or any previous visit (from the past 240 hour(s)).   Patient was seen and examined on the day of discharge and was found to be in stable condition. Time coordinating discharge: 25 minutes including assessment and coordination of care, as well as examination of the patient.   SIGNED:  Dessa Phi, DO [facility] 05/29/2020, 8:09 AM

## 2020-05-29 NOTE — Telephone Encounter (Signed)
Kinsey with [facility] contacted the office [phone] requesting clarification for patient's wound vac changes outpatient with home health.  She was unsure if patient was to have wound vac changes M-W-F or M-F.  Advised that patient is to have his vac changed by home health on Friday, 06/01/20 and patient has appointment scheduled Monday 06/04/20.  She acknowledged receipt.

## 2020-06-04 ENCOUNTER — Telehealth: Payer: Self-pay | Admitting: *Deleted

## 2020-06-04 ENCOUNTER — Other Ambulatory Visit: Payer: Self-pay

## 2020-06-04 ENCOUNTER — Ambulatory Visit (INDEPENDENT_AMBULATORY_CARE_PROVIDER_SITE_OTHER): Payer: Self-pay | Admitting: Thoracic Surgery (Cardiothoracic Vascular Surgery)

## 2020-06-04 VITALS — Ht 67.0 in

## 2020-06-04 DIAGNOSIS — Z5189 Encounter for other specified aftercare: Secondary | ICD-10-CM

## 2020-06-04 DIAGNOSIS — Z4689 Encounter for fitting and adjustment of other specified devices: Secondary | ICD-10-CM

## 2020-06-04 MED ORDER — LEVOFLOXACIN 500 MG PO TABS: 500.0000 mg | ORAL_TABLET | Freq: Every day | ORAL | 0 refills | Status: DC

## 2020-06-04 NOTE — Telephone Encounter (Signed)
Spoke with Mr. [place] home health nurse, Cecille Rubin. Informed her of Dr. Leonarda Salon vac change instructions of changing the superficial black sponge twice a week & Dr. Roxan Hockey himself will change the deeper white sponge in office. We also discussed the pt's need to bring the whitefoam sponge to the office with him his next office visit. Cecille Rubin stated she was going to look into getting some for the pt and will notify us if she is able.

## 2020-06-04 NOTE — Progress Notes (Signed)
[address]       [place], [address]             [phone]      HPI: Mr. Ilic returns for a wound check  Mathew Hall is a 79 year old gentleman who recently had pneumonia complicated by an empyema.  He was initially managed on the medical service.  He had a chest tube placed.  That was removed and he was discharged home.  He presented back with an empyema necessitating and's.  I did a modified Eloesser procedure with rib resection and VAC placement on 05/11/2020.  He stayed in the hospital we did his initial VAC changes in the operating room first under general anesthesia and then with progressively less sedation.  We then did dressing changes at bedside and he tolerated that well.  He was discharged home last week.  Home health nurse did change the superficial VAC sponge last week.  Over the weekend he noticed a new rash.  He has been on Augmentin.  Past Medical History:  Diagnosis Date  . Asthma   . CAD (coronary artery disease) 1999   stents to RCA, residual 40% Lt main, decreased EF40%   . Cataract    bilateral-beginning stages  . Colon polyps   . GERD (gastroesophageal reflux disease)   . H/O cardiovascular stress test 1999/2005/2008/2011   last with inf scar/infarct, EF 54%  . H/O echocardiogram 2008   EF 50-55%, Mild MR, Mild TR,    . Hemorrhoids   . 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 Hyperlipemia   . Hypertension   . lung ca dx'd 10/2008   chemo comp 01/2009-stage 4 lung cancer  . Myocardial infarction ([place]) 02-18-1998  . Peptic stricture of esophagus   . Right carotid bruit 04/01/12   minimal carotid stenosis on doppler    Current Outpatient Medications  Medication Sig Dispense Refill  . amLODipine (NORVASC) 10 MG tablet Take 1 tablet (10 mg total) by mouth daily. 30 tablet 2  . aspirin 325 MG EC tablet Take 1 tablet (325 mg total) by mouth daily. Please hold for 15 days when you are on Eliquis, resume on 10/11  once you are off Eliquis.    Marland Kitchen ergocalciferol (VITAMIN D2) 1.25 MG (50000 UT) capsule Take 50,000 Units by mouth once a week. Friday    . esomeprazole (NEXIUM) 40 MG capsule Take 40 mg by mouth daily at 12 noon.    . nitroGLYCERIN (NITROSTAT) 0.4 MG SL tablet Place 1 tablet (0.4 mg total) under the tongue every 5 (five) minutes as needed for chest pain. 90 tablet 3  . oxybutynin (DITROPAN) 5 MG tablet Take 5 mg by mouth every 8 (eight) hours as needed for bladder spasms.   2  . phenylephrine (SUDAFED PE) 10 MG TABS tablet Take 10 mg by mouth every 4 (four) hours as needed (sinus congestion).    . Prenatal Multivit-Min-Fe-FA (PRENATAL 1 + IRON PO) Take 1 tablet by mouth daily.    Marland Kitchen levofloxacin (LEVAQUIN) 500 MG tablet Take 1 tablet (500 mg total) by mouth daily. 14 tablet 0   Current Facility-Administered Medications  Medication Dose Route Frequency Provider Last Rate Last Admin  . 0.9 %  sodium chloride infusion  500 mL Intravenous Once [name], MD        Physical Exam Ht 5\' 7"  (1.702 m)   BMI 23.57 kg/m  79 year old man in no acute distress  Alert and oriented x3 with no focal deficits Maculopapular rash on thighs Right chest wound clean and granulating Superficial wound 9 cm long, 3 cm wide, 3 cm deep Pleural space approximately 6 cm deep, 4 cm wide, 5 cm long VAC dressing change performed, one third of a white sponge placed in pleural space  Diagnostic Tests: None  Impression: Mathew Hall is a 79 year old man with a history of coronary disease, angioplasty, hypertension, reflux, remote tobacco use, 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  He had a modified Eloesser procedure on 05/11/2020.  He is now being managed with a wound VAC.  We will continue local wound care with VAC dressing changes.  We will change the deep sponge once a week in the office and continue to  change the superficial sponge which twice weekly.  Rash-likely an allergic reaction to Augmentin.  We will stop that and start him on Levaquin 500 mg daily for 2 weeks.  Plan: DC Augmentin Levaquin 500 mg daily x14 days Return in 1 week for VAC change with white sponge   [name], MD [facility] [phone]

## 2020-06-11 ENCOUNTER — Other Ambulatory Visit: Payer: Self-pay | Admitting: Thoracic Surgery (Cardiothoracic Vascular Surgery)

## 2020-06-11 DIAGNOSIS — J860 Pyothorax with fistula: Secondary | ICD-10-CM

## 2020-06-12 ENCOUNTER — Ambulatory Visit (INDEPENDENT_AMBULATORY_CARE_PROVIDER_SITE_OTHER): Payer: Self-pay | Admitting: Thoracic Surgery (Cardiothoracic Vascular Surgery)

## 2020-06-12 ENCOUNTER — Ambulatory Visit
Admission: RE | Admit: 2020-06-12 | Discharge: 2020-06-12 | Disposition: A | Discharge status: Home / Self Care | Payer: Medicare Other | Source: Ambulatory Visit | Attending: Thoracic Surgery (Cardiothoracic Vascular Surgery) | Admitting: Thoracic Surgery (Cardiothoracic Vascular Surgery)

## 2020-06-12 ENCOUNTER — Other Ambulatory Visit: Payer: Self-pay

## 2020-06-12 ENCOUNTER — Encounter: Payer: Self-pay | Admitting: Thoracic Surgery (Cardiothoracic Vascular Surgery)

## 2020-06-12 VITALS — BP 114/68 | HR 62 | Resp 20 | Ht 68.0 in | Wt 155.0 lb

## 2020-06-12 DIAGNOSIS — Z09 Encounter for follow-up examination after completed treatment for conditions other than malignant neoplasm: Secondary | ICD-10-CM

## 2020-06-12 DIAGNOSIS — Z5189 Encounter for other specified aftercare: Secondary | ICD-10-CM

## 2020-06-12 DIAGNOSIS — J860 Pyothorax with fistula: Secondary | ICD-10-CM

## 2020-06-12 NOTE — Progress Notes (Signed)
[address]       [place] [address]             [phone]     HPI: [name] returns for scheduled follow-up visit  Mathew Hall is a 79 year old man who recently had a pneumonia complicated by an empyema.  He was initially managed with chest tube drainage but presented back with an empyema necessitans.  I did a modified Eloesser procedure with rib resection and VAC placement on 05/11/2020.  He has been coming to the office for deep back splint changes on a weekly basis and a home health nurses changing the super superficial sponge once a week as well.  He is feeling well.  He denies any fevers or chills.  Really no pain with the dressing changes other than removal of the adhesive from the skin.  He has a few more days left on his Levaquin.  Past Medical History:  Diagnosis Date  . Asthma   . CAD (coronary artery disease) 1999   stents to RCA, residual 40% Lt main, decreased EF40%   . Cataract    bilateral-beginning stages  . Colon polyps   . GERD (gastroesophageal reflux disease)   . H/O cardiovascular stress test 1999/2005/2008/2011   last with inf scar/infarct, EF 54%  . H/O echocardiogram 2008   EF 50-55%, Mild MR, Mild TR,    . Hemorrhoids   . 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 Hyperlipemia   . Hypertension   . lung ca dx'd 10/2008   chemo comp 01/2009-stage 4 lung cancer  . Myocardial infarction ([place]) 02-18-1998  . Peptic stricture of esophagus   . Right carotid bruit 04/01/12   minimal carotid stenosis on doppler    Current Outpatient Medications  Medication Sig Dispense Refill  . amLODipine (NORVASC) 10 MG tablet Take 1 tablet (10 mg total) by mouth daily. 30 tablet 2  . aspirin 325 MG EC tablet Take 1 tablet (325 mg total) by mouth daily. Please hold for 15 days when you are on Eliquis, resume on 10/11 once you are off Eliquis.    Marland Kitchen ergocalciferol (VITAMIN D2) 1.25 MG (50000 UT) capsule Take 50,000 Units by  mouth once a week. Friday    . esomeprazole (NEXIUM) 40 MG capsule Take 40 mg by mouth daily at 12 noon.    Marland Kitchen levofloxacin (LEVAQUIN) 500 MG tablet Take 1 tablet (500 mg total) by mouth daily. 14 tablet 0  . nitroGLYCERIN (NITROSTAT) 0.4 MG SL tablet Place 1 tablet (0.4 mg total) under the tongue every 5 (five) minutes as needed for chest pain. 90 tablet 3  . oxybutynin (DITROPAN) 5 MG tablet Take 5 mg by mouth every 8 (eight) hours as needed for bladder spasms.   2  . phenylephrine (SUDAFED PE) 10 MG TABS tablet Take 10 mg by mouth every 4 (four) hours as needed (sinus congestion).    . Prenatal Multivit-Min-Fe-FA (PRENATAL 1 + IRON PO) Take 1 tablet by mouth daily.     Current Facility-Administered Medications  Medication Dose Route Frequency Provider Last Rate Last Admin  . 0.9 %  sodium chloride infusion  500 mL Intravenous Once [name], MD        Physical Exam BP 114/68   Pulse 62   Resp 20   Ht 5\' 8"  (1.727 m)   Wt 155 lb (70.3 kg)   SpO2 98% Comment: RA  BMI 23.62 kg/m  79 year old  man in no acute distress Wound: Superficial part is about 8 cm long 3 cm wide and 2.5 cm deep, clean and granulating.  Deep intrapleural cavity is 2 cm long x 3 cm wide x 5 cm deep.  Diagnostic Tests: CHEST - 2 VIEW  COMPARISON:  05/15/2020  FINDINGS: Residual posterior pleural-based density in the right lower lung. Postsurgical changes in the right lung with patchy and linear densities in the right lower chest. Small focus of lucency in the right upper lung is nonspecific. Left lung remains clear. Negative for a pneumothorax. Heart and mediastinum are within normal limits.  IMPRESSION: Persistent pleural-based opacity in the posterior right lower chest. Negative for pneumothorax.  Patchy densities in the right lower chest may represent areas of atelectasis and/or scarring.  Focal lucency in the right upper lung is nonspecific but may be related to underlying  emphysema.   Electronically Signed   By: Markus Daft M.D.   On: 06/12/2020 10:06 I personally reviewed the chest x-ray images and concur with the findings noted above  Impression: Mathew Hall is a 79 year old gentleman with a history of pneumonia complicated by empyema which then became an empyema necessitans.  Treated with a modified Eloesser flap with VAC placement on 05/11/2020.  His wound continues to do extremely well.  It is healing more rapidly than I expected.  Hopefully will be resolved sometime close to the first of the year.  He is tolerating the VAC changes well.  Plan: Continue local wound care Complete course of Levaquin. Return in 1 week to change the deep sponge  [name], MD [facility] [phone]

## 2020-06-18 ENCOUNTER — Other Ambulatory Visit: Payer: Self-pay

## 2020-06-18 ENCOUNTER — Encounter: Payer: Self-pay | Admitting: Thoracic Surgery (Cardiothoracic Vascular Surgery)

## 2020-06-18 ENCOUNTER — Ambulatory Visit (INDEPENDENT_AMBULATORY_CARE_PROVIDER_SITE_OTHER): Payer: Self-pay | Admitting: Thoracic Surgery (Cardiothoracic Vascular Surgery)

## 2020-06-18 DIAGNOSIS — Z4689 Encounter for fitting and adjustment of other specified devices: Secondary | ICD-10-CM | POA: Insufficient documentation

## 2020-06-18 NOTE — Progress Notes (Signed)
[address]       [place] [address]             [phone]     HPI: Mr. [name] returns for management of his chest wound  Mathew Hall is a 79 year old man who recently had a pneumonia complicated by an empyema.  He was initially managed with chest tube drainage but presented back with an empyema necessitans.  I did a modified Eloesser procedure with rib resection and VAC placement on 05/11/2020.  He has been coming to the office for deep VAC sponge changes on a weekly basis and a home health nurse is changing the super superficial sponge once a week as well.  He was originally on Augmentin but that was stopped due to a rash.  He was started on Levaquin.  He did well with that for about 12 days and then developed a rash from that as well.  He stopped taking it.  He is not having any fevers or chills.  He has had a lot of alarming of the VAC this past week as the drainage has been thicker.  Past Medical History:  Diagnosis Date  . Asthma   . CAD (coronary artery disease) 1999   stents to RCA, residual 40% Lt main, decreased EF40%   . Cataract    bilateral-beginning stages  . Colon polyps   . GERD (gastroesophageal reflux disease)   . H/O cardiovascular stress test 1999/2005/2008/2011   last with inf scar/infarct, EF 54%  . H/O echocardiogram 2008   EF 50-55%, Mild MR, Mild TR,    . Hemorrhoids   . 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 Hyperlipemia   . Hypertension   . lung ca dx'd 10/2008   chemo comp 01/2009-stage 4 lung cancer  . Myocardial infarction ([place]) 02-18-1998  . Peptic stricture of esophagus   . Right carotid bruit 04/01/12   minimal carotid stenosis on doppler    Current Outpatient Medications  Medication Sig Dispense Refill  . amLODipine (NORVASC) 10 MG tablet Take 1 tablet (10 mg total) by mouth daily. 30 tablet 2  . aspirin 325 MG EC tablet Take 1 tablet (325 mg total) by mouth daily. Please hold for 15 days  when you are on Eliquis, resume on 10/11 once you are off Eliquis.    Marland Kitchen ergocalciferol (VITAMIN D2) 1.25 MG (50000 UT) capsule Take 50,000 Units by mouth once a week. Friday    . esomeprazole (NEXIUM) 40 MG capsule Take 40 mg by mouth daily at 12 noon.    . nitroGLYCERIN (NITROSTAT) 0.4 MG SL tablet Place 1 tablet (0.4 mg total) under the tongue every 5 (five) minutes as needed for chest pain. 90 tablet 3  . oxybutynin (DITROPAN) 5 MG tablet Take 5 mg by mouth every 8 (eight) hours as needed for bladder spasms.   2  . phenylephrine (SUDAFED PE) 10 MG TABS tablet Take 10 mg by mouth every 4 (four) hours as needed (sinus congestion).    . Prenatal Multivit-Min-Fe-FA (PRENATAL 1 + IRON PO) Take 1 tablet by mouth daily.    Marland Kitchen levofloxacin (LEVAQUIN) 500 MG tablet Take 1 tablet (500 mg total) by mouth daily. (Patient not taking: Reported on 06/18/2020) 14 tablet 0   Current Facility-Administered Medications  Medication Dose Route Frequency Provider Last Rate Last Admin  . 0.9 %  sodium chloride infusion  500 mL Intravenous Once [name], MD  Physical Exam BP 118/67 (BP Location: Left Arm, Patient Position: Sitting, Cuff Size: Normal)   Pulse 99   Resp 18   Wt 152 lb 6.4 oz (69.1 kg)   SpO2 97% Comment: RA  BMI 23.25 kg/m  79 year old man in no acute distress Alert and oriented x3 Pustular rash around the wound. Wound: Superficial 7 cm long 2.5 cm wide, 2 cm deep.  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  Treated with a modified Eloesser procedure with VAC placement on 05/11/2020.  He continues to do well.  He has now had a reaction to Levaquin.  That medication has been stopped.  The wound itself is granulating.  It is getting progressively smaller.  At this point I do not think he needs any additional antibiotics and will  just manage with local wound care.  Plan: Return in 2 weeks for [facility] change  [name], MD [facility] [phone]

## 2020-07-02 ENCOUNTER — Ambulatory Visit (INDEPENDENT_AMBULATORY_CARE_PROVIDER_SITE_OTHER): Payer: Self-pay | Admitting: Thoracic Surgery (Cardiothoracic Vascular Surgery)

## 2020-07-02 ENCOUNTER — Other Ambulatory Visit: Payer: Self-pay

## 2020-07-02 ENCOUNTER — Encounter: Payer: Self-pay | Admitting: Thoracic Surgery (Cardiothoracic Vascular Surgery)

## 2020-07-02 VITALS — BP 115/68 | HR 66 | Resp 20 | Ht 68.0 in | Wt 156.2 lb

## 2020-07-02 DIAGNOSIS — Z4689 Encounter for fitting and adjustment of other specified devices: Secondary | ICD-10-CM

## 2020-07-02 NOTE — Progress Notes (Signed)
Mathew Hall       [name] [address]             [phone]     HPI: Mathew Hall returns for follow-up of his chest wound  Mathew Hall is a 79 year old gentleman who had a pneumonia complicated by an empyema.  He developed an empyema necessitans.  I did a modified Eloesser with rib resection and VAC placement on 05/11/2020.  He has been managed with local wound care.  He was treated with antibiotics.  He is completed antibiotics at this point.  In the interim since his last visit he has been feeling well.  He does not have any pain at the site.  He is not having any fevers, chills, or shortness of breath.  Past Medical History:  Diagnosis Date  . Asthma   . CAD (coronary artery disease) 1999   stents to RCA, residual 40% Lt main, decreased EF40%   . Cataract    bilateral-beginning stages  . Colon polyps   . GERD (gastroesophageal reflux disease)   . H/O cardiovascular stress test 1999/2005/2008/2011   last with inf scar/infarct, EF 54%  . H/O echocardiogram 2008   EF 50-55%, Mild MR, Mild TR,    . Hemorrhoids   . 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 Hyperlipemia   . Hypertension   . lung ca dx'd 10/2008   chemo comp 01/2009-stage 4 lung cancer  . Myocardial infarction ([place]) 02-18-1998  . Peptic stricture of esophagus   . Right carotid bruit 04/01/12   minimal carotid stenosis on doppler    Current Outpatient Medications  Medication Sig Dispense Refill  . amLODipine (NORVASC) 10 MG tablet Take 1 tablet (10 mg total) by mouth daily. 30 tablet 2  . aspirin 325 MG EC tablet Take 1 tablet (325 mg total) by mouth daily. Please hold for 15 days when you are on Eliquis, resume on 10/11 once you are off Eliquis.    Marland Kitchen ergocalciferol (VITAMIN D2) 1.25 MG (50000 UT) capsule Take 50,000 Units by mouth once a week. Friday    . esomeprazole (NEXIUM) 40 MG capsule Take 40 mg by mouth daily at 12 noon.    . nitroGLYCERIN (NITROSTAT) 0.4 MG  SL tablet Place 1 tablet (0.4 mg total) under the tongue every 5 (five) minutes as needed for chest pain. 90 tablet 3  . oxybutynin (DITROPAN) 5 MG tablet Take 5 mg by mouth every 8 (eight) hours as needed for bladder spasms.   2  . phenylephrine (SUDAFED PE) 10 MG TABS tablet Take 10 mg by mouth every 4 (four) hours as needed (sinus congestion).    . Prenatal Multivit-Min-Fe-FA (PRENATAL 1 + IRON PO) Take 1 tablet by mouth daily.     Current Facility-Administered Medications  Medication Dose Route Frequency Provider Last Rate Last Admin  . 0.9 %  sodium chloride infusion  500 mL Intravenous Once [name], MD        Physical Exam BP 115/68 (BP Location: Left Arm, Patient Position: Sitting)   Pulse 66   Resp 20   Ht 5\' 8"  (1.727 m)   Wt 156 lb 3.2 oz (70.9 kg)   SpO2 100% Comment: RA with mask on  BMI 23.53 kg/m  79 year old man in no acute distress Alert and oriented x3 with no focal deficits Wound clean and granulating over 100% Deep: Approximately 1 x 1.5 x 5 cm Superficial: 6 cm long  x 2 cm wide x 1.5 cm deep  Impression: Mathew Hall is a 79 year old gentleman with a history of an empyema necessitating and.  That was treated with a modified Eloesser procedure with VAC placement on 05/11/2020.  He has completed all antibiotics and is afebrile and without any shortness of breath.  His wound continues to progress nicely.  It is dramatically smaller than it was when we started it is almost completely healed at this point.  I suspect that should be healed within a month or so.  He does still have a deep component so we need to continue with the [facility] for now.  Plan: Return in 1 week We will do PA lateral chest x-ray when he returns next week.  [name], MD [facility] [phone]

## 2020-07-04 ENCOUNTER — Other Ambulatory Visit: Payer: Self-pay | Admitting: Thoracic Surgery (Cardiothoracic Vascular Surgery)

## 2020-07-04 DIAGNOSIS — J860 Pyothorax with fistula: Secondary | ICD-10-CM

## 2020-07-04 DIAGNOSIS — J869 Pyothorax without fistula: Secondary | ICD-10-CM

## 2020-07-05 ENCOUNTER — Ambulatory Visit
Admission: RE | Admit: 2020-07-05 | Discharge: 2020-07-05 | Disposition: A | Discharge status: Home / Self Care | Payer: Medicare Other | Source: Ambulatory Visit | Attending: Thoracic Surgery (Cardiothoracic Vascular Surgery) | Admitting: Thoracic Surgery (Cardiothoracic Vascular Surgery)

## 2020-07-05 DIAGNOSIS — J860 Pyothorax with fistula: Secondary | ICD-10-CM

## 2020-07-09 ENCOUNTER — Other Ambulatory Visit: Payer: Self-pay

## 2020-07-09 ENCOUNTER — Encounter: Payer: Self-pay | Admitting: Thoracic Surgery (Cardiothoracic Vascular Surgery)

## 2020-07-09 ENCOUNTER — Ambulatory Visit (INDEPENDENT_AMBULATORY_CARE_PROVIDER_SITE_OTHER): Payer: Self-pay | Admitting: Thoracic Surgery (Cardiothoracic Vascular Surgery)

## 2020-07-09 VITALS — BP 138/68 | HR 65 | Resp 20 | Ht 68.0 in

## 2020-07-09 DIAGNOSIS — Z4689 Encounter for fitting and adjustment of other specified devices: Secondary | ICD-10-CM

## 2020-07-09 NOTE — Progress Notes (Signed)
[address]athew Hall [address]       [place] [address]             [phone]       HPI: Mathew Hall returns for management of his complex right chest wound  Mathew Hall is a 80 year old man who had pneumonia complicated by empyema which ultimately progressed to an empyema necessitans.  He had a modified Eloesser procedure on 05/11/2020.  He was treated with a course of intravenous antibiotics followed by oral antibiotics.  His wound has been managed with a VAC.  I last saw him in the office a week ago.  He was doing well at that time.  His wound was progressing nicely.  Today he has no complaints.  Past Medical History:  Diagnosis Date  . Asthma   . CAD (coronary artery disease) 1999   stents to RCA, residual 40% Lt main, decreased EF40%   . Cataract    bilateral-beginning stages  . Colon polyps   . GERD (gastroesophageal reflux disease)   . H/O cardiovascular stress test 1999/2005/2008/2011   last with inf scar/infarct, EF 54%  . H/O echocardiogram 2008   EF 50-55%, Mild MR, Mild TR,    . Hemorrhoids   . 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 Hyperlipemia   . Hypertension   . lung ca dx'd 10/2008   chemo comp 01/2009-stage 4 lung cancer  . Myocardial infarction ([place]) 02-18-1998  . Peptic stricture of esophagus   . Right carotid bruit 04/01/12   minimal carotid stenosis on doppler    Current Outpatient Medications  Medication Sig Dispense Refill  . amLODipine (NORVASC) 10 MG tablet Take 1 tablet (10 mg total) by mouth daily. 30 tablet 2  . aspirin 325 MG EC tablet Take 1 tablet (325 mg total) by mouth daily. Please hold for 15 days when you are on Eliquis, resume on 10/11 once you are off Eliquis.    Marland Kitchen ergocalciferol (VITAMIN D2) 1.25 MG (50000 UT) capsule Take 50,000 Units by mouth once a week. Friday    . esomeprazole (NEXIUM) 40 MG capsule Take 40 mg by mouth daily at 12 noon.    . nitroGLYCERIN (NITROSTAT) 0.4 MG SL tablet Place 1  tablet (0.4 mg total) under the tongue every 5 (five) minutes as needed for chest pain. 90 tablet 3  . oxybutynin (DITROPAN) 5 MG tablet Take 5 mg by mouth every 8 (eight) hours as needed for bladder spasms.   2  . phenylephrine (SUDAFED PE) 10 MG TABS tablet Take 10 mg by mouth every 4 (four) hours as needed (sinus congestion).    . Prenatal Multivit-Min-Fe-FA (PRENATAL 1 + IRON PO) Take 1 tablet by mouth daily.     Current Facility-Administered Medications  Medication Dose Route Frequency Provider Last Rate Last Admin  . 0.9 %  sodium chloride infusion  500 mL Intravenous Once [name], MD        Physical Exam BP 138/68 (BP Location: Left Arm, Patient Position: Sitting)   Pulse 65   Resp 20   Ht 5\' 8"  (1.727 m)   SpO2 97% Comment: RA with mask on  BMI 23.75 kg/m  Wound granulating 100% Deep: approximately 2 cm deep by 1 cm long by 1 cm wide Superficial: 3 cm length by 1 cm with by 5 mm deep  Diagnostic Tests: CHEST - 2 VIEW  COMPARISON:  06/12/2020  FINDINGS: Heart and mediastinal shadows are normal.  The left chest is clear. Chronic pleural and parenchymal density on the right, similar to the previous study. No worsening or new finding allowing for technical differences.  IMPRESSION: Chronic pleural and parenchymal density on the right. No worsening or new finding.   Electronically Signed   By: Nelson Chimes M.D.   On: 07/05/2020 15:49 I personally reviewed the chest x-ray images and concur with the findings noted above.  Overall an excellent postoperative result.  Impression: Mathew Hall is a 80 year old man with a pneumonia complicated by an empyema necessitans.  He had a modified Eloesser procedure on 05/11/2020.  He has been managed with wound VAC since then.  His wound continues to heal beautifully.  There is just enough depth to the deep component I think we should continue the back for another week.  I would be surprised if we can get rid of the [facility]  after next week.  The wound is granulating nicely with no evidence of infection  Plan: Return in 1 week  [name], MD [facility] [phone]

## 2020-07-10 ENCOUNTER — Encounter: Payer: Medicare Other | Admitting: Thoracic Surgery (Cardiothoracic Vascular Surgery)

## 2020-07-16 ENCOUNTER — Encounter: Payer: Self-pay | Admitting: Thoracic Surgery (Cardiothoracic Vascular Surgery)

## 2020-07-16 ENCOUNTER — Other Ambulatory Visit: Payer: Self-pay

## 2020-07-16 ENCOUNTER — Ambulatory Visit (INDEPENDENT_AMBULATORY_CARE_PROVIDER_SITE_OTHER): Payer: Self-pay | Admitting: Thoracic Surgery (Cardiothoracic Vascular Surgery)

## 2020-07-16 ENCOUNTER — Telehealth: Payer: Self-pay | Admitting: *Deleted

## 2020-07-16 VITALS — BP 116/69 | HR 72 | Resp 20 | Ht 68.0 in | Wt 156.0 lb

## 2020-07-16 DIAGNOSIS — Z4689 Encounter for fitting and adjustment of other specified devices: Secondary | ICD-10-CM

## 2020-07-16 NOTE — Progress Notes (Signed)
      [address]       [place] [address]             [phone]      [name] returns for a scheduled follow-up visit regarding his right chest wound.  Mathew Hall is a 80 year old man who had a pneumonia complicated by empyema which have progressed to an empyema necessitans.  He had a modified Eloesser procedure on 05/11/2020.  He was treated with intravenous antibiotics and then a course of oral antibiotics.  He has been managed with local wound care with [facility] since his initial surgery.  I saw him last week his incision was almost entirely healed.  On exam today the wound is clean and granulating.  The deep portion of the wound posteriorly is about half a centimeter deep and half a centimeter in diameter.  There is some granulation tissue in the area that was a superficial wound but that part of the wound is almost completely healed.  At this point I think we can safely go to normal saline wet-to-dry's to pack wound.  That can be done every other day.  Return in 2 weeks  Revonda Standard. Roxan Hockey, MD [facility] [phone]

## 2020-07-16 NOTE — Telephone Encounter (Signed)
Contacted pt's home health nurse, Mardene Celeste, to notify her of pt's wound therapy change from vac therapy to wet to dry dressing changes. Receipt acknowledged.

## 2020-07-30 ENCOUNTER — Inpatient Hospital Stay: Payer: Medicare Other | Attending: Internal Medicine

## 2020-07-30 ENCOUNTER — Other Ambulatory Visit: Payer: Self-pay

## 2020-07-30 ENCOUNTER — Encounter: Payer: Self-pay | Admitting: Thoracic Surgery (Cardiothoracic Vascular Surgery)

## 2020-07-30 ENCOUNTER — Ambulatory Visit (INDEPENDENT_AMBULATORY_CARE_PROVIDER_SITE_OTHER): Payer: Self-pay | Admitting: Thoracic Surgery (Cardiothoracic Vascular Surgery)

## 2020-07-30 ENCOUNTER — Ambulatory Visit (HOSPITAL_COMMUNITY)
Admission: RE | Admit: 2020-07-30 | Discharge: 2020-07-30 | Disposition: A | Discharge status: Home / Self Care | Payer: Medicare Other | Source: Ambulatory Visit | Attending: Internal Medicine | Admitting: Internal Medicine

## 2020-07-30 VITALS — BP 133/55 | HR 63 | Resp 20

## 2020-07-30 DIAGNOSIS — C3490 Malignant neoplasm of unspecified part of unspecified bronchus or lung: Secondary | ICD-10-CM | POA: Diagnosis not present

## 2020-07-30 DIAGNOSIS — Z9089 Acquired absence of other organs: Secondary | ICD-10-CM | POA: Insufficient documentation

## 2020-07-30 DIAGNOSIS — Z85118 Personal history of other malignant neoplasm of bronchus and lung: Secondary | ICD-10-CM | POA: Insufficient documentation

## 2020-07-30 DIAGNOSIS — Z9221 Personal history of antineoplastic chemotherapy: Secondary | ICD-10-CM | POA: Insufficient documentation

## 2020-07-30 DIAGNOSIS — Z902 Acquired absence of lung [part of]: Secondary | ICD-10-CM | POA: Insufficient documentation

## 2020-07-30 DIAGNOSIS — Z8709 Personal history of other diseases of the respiratory system: Secondary | ICD-10-CM | POA: Diagnosis not present

## 2020-07-30 DIAGNOSIS — Z5189 Encounter for other specified aftercare: Secondary | ICD-10-CM

## 2020-07-30 LAB — CMP (CANCER CENTER ONLY)
ALT: 11 U/L (ref 0–44)
AST: 16 U/L (ref 15–41)
Albumin: 4.1 g/dL (ref 3.5–5.0)
Alkaline Phosphatase: 91 U/L (ref 38–126)
Anion gap: 10 (ref 5–15)
BUN: 20 mg/dL (ref 8–23)
CO2: 25 mmol/L (ref 22–32)
Calcium: 10 mg/dL (ref 8.9–10.3)
Chloride: 106 mmol/L (ref 98–111)
Creatinine: 1 mg/dL (ref 0.61–1.24)
GFR, Estimated: >60 mL/min (ref >60)
Glucose, Bld: 108 mg/dL — ABNORMAL HIGH (ref 70–99)
Potassium: 4.2 mmol/L (ref 3.5–5.1)
Sodium: 141 mmol/L (ref 135–145)
Total Bilirubin: 0.5 mg/dL (ref 0.3–1.2)
Total Protein: 8.3 g/dL — ABNORMAL HIGH (ref 6.5–8.1)

## 2020-07-30 LAB — CBC WITH DIFFERENTIAL (CANCER CENTER ONLY)
Abs Immature Granulocytes: 0.01 10*3/uL (ref 0.00–0.07)
Basophils Absolute: 0.1 10*3/uL (ref 0.0–0.1)
Basophils Relative: 2 %
Eosinophils Absolute: 0.2 10*3/uL (ref 0.0–0.5)
Eosinophils Relative: 5 %
HCT: 44.3 % (ref 39.0–52.0)
Hemoglobin: 14 g/dL (ref 13.0–17.0)
Immature Granulocytes: 0 %
Lymphocytes Relative: 40 %
Lymphs Abs: 1.9 10*3/uL (ref 0.7–4.0)
MCH: 27.1 pg (ref 26.0–34.0)
MCHC: 31.6 g/dL (ref 30.0–36.0)
MCV: 85.9 fL (ref 80.0–100.0)
Monocytes Absolute: 0.4 10*3/uL (ref 0.1–1.0)
Monocytes Relative: 9 %
Neutro Abs: 2.1 10*3/uL (ref 1.7–7.7)
Neutrophils Relative %: 44 %
Platelet Count: 208 10*3/uL (ref 150–400)
RBC: 5.16 MIL/uL (ref 4.22–5.81)
RDW: 15.7 % — ABNORMAL HIGH (ref 11.5–15.5)
WBC Count: 4.7 10*3/uL (ref 4.0–10.5)
nRBC: 0 % (ref 0.0–0.2)

## 2020-07-30 NOTE — Progress Notes (Signed)
      [address]       [place], [address]             [phone]    HPI: Mathew Hall returns for a wound check  Mathew Hall is a 80 year old man who had pneumonia complicated by empyema which progressed to an empyema necessitans.  I did a modified Eloesser procedure on 05/11/2020.  He was treated with intravenous antibiotics for several weeks and then finished course of oral antibiotics.  He was treated with local wound care with the back.  We discontinued the back a couple of weeks ago.  Since then he has been doing well.  His wife says it barely anything to clean she is cleaning it with a Q-tip but there is not much of a cavity anymore.   Current Outpatient Medications  Medication Sig Dispense Refill  . amLODipine (NORVASC) 10 MG tablet Take 1 tablet (10 mg total) by mouth daily. 30 tablet 2  . aspirin 325 MG EC tablet Take 1 tablet (325 mg total) by mouth daily. Please hold for 15 days when you are on Mathew Hall, resume on 10/11 once you are off Mathew Hall.    Marland Kitchen ergocalciferol (VITAMIN D2) 1.25 MG (50000 UT) capsule Take 50,000 Units by mouth once a week. Friday    . esomeprazole (NEXIUM) 40 MG capsule Take 40 mg by mouth daily at 12 noon.    . nitroGLYCERIN (NITROSTAT) 0.4 MG SL tablet Place 1 tablet (0.4 mg total) under the tongue every 5 (five) minutes as needed for chest pain. 90 tablet 3  . oxybutynin (DITROPAN) 5 MG tablet Take 5 mg by mouth every 8 (eight) hours as needed for bladder spasms.   2  . phenylephrine (SUDAFED PE) 10 MG TABS tablet Take 10 mg by mouth every 4 (four) hours as needed (sinus congestion).    . Prenatal Multivit-Min-Fe-FA (PRENATAL 1 + IRON PO) Take 1 tablet by mouth daily.     Current Facility-Administered Medications  Medication Dose Route Frequency Provider Last Rate Last Admin  . 0.9 %  sodium chloride infusion  500 mL Intravenous Once [name], MD        Physical Exam BP (!) 133/55 (BP Location: Left Arm, Patient Position:  Sitting)   Pulse 63   Resp 20   SpO44 55%  80 year old man in no acute distress Alert and oriented x3 with no focal deficits Wound clean and dry, small open area about 5 mm in diameter and 5 mm in depth at posterior aspect of wound.  Impression: Continue local wound care  Plan: Return in 1 month  [name], MD [facility] [phone]

## 2020-07-31 ENCOUNTER — Inpatient Hospital Stay: Payer: Medicare Other | Admitting: Internal Medicine

## 2020-07-31 VITALS — BP 133/75 | HR 72 | Temp 98.6°F | Resp 18 | Ht 68.0 in | Wt 157.7 lb

## 2020-07-31 DIAGNOSIS — J869 Pyothorax without fistula: Secondary | ICD-10-CM | POA: Diagnosis not present

## 2020-07-31 DIAGNOSIS — Z85118 Personal history of other malignant neoplasm of bronchus and lung: Secondary | ICD-10-CM | POA: Diagnosis not present

## 2020-07-31 DIAGNOSIS — C342 Malignant neoplasm of middle lobe, bronchus or lung: Secondary | ICD-10-CM

## 2020-07-31 DIAGNOSIS — C3490 Malignant neoplasm of unspecified part of unspecified bronchus or lung: Secondary | ICD-10-CM | POA: Diagnosis not present

## 2020-07-31 NOTE — Progress Notes (Signed)
[facility] [phone]   [phone]  OFFICE PROGRESS NOTE  [name], MD [place] [address]  DIAGNOSIS: Metastatic nonsmall cell lung cancer, adenocarcinoma, diagnosed in May 2010.   PRIOR THERAPY:  1. Status post 3 cycles of neoadjuvant chemotherapy with carboplatin and Alimta; last dose was given January 10, 2009, with complete resolution of the left upper lobe mass as well as the subcarinal lymphadenopathy and partial response in the right middle lobe lung lesion. 2. Status post right middle lobectomy with lymph node dissection under the care of Dr. 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  CURRENT THERAPY: Observation.  INTERVAL HISTORY: Mathew Hall 80 y.o. male returns to the clinic today for annual follow-up visit.  The patient was diagnosed with pneumonia and empyema in November 2021 and he underwent right thoracotomy with drainage of the empyema and rib resection with VAC placement under the care of Dr. Roxan Hockey on May 11, 2020.  The patient had rough time in the last few months.  He is feeling much better now.  He denied having any current chest pain but has mild shortness of breath and cough with no hemoptysis.  He denied having any fever or chills.  He has no nausea, vomiting, diarrhea or constipation.  He has no headache or visual changes.  He had repeat CT scan of the chest performed recently and is here for evaluation and discussion of his discuss results.   MEDICAL HISTORY: Past Medical History:  Diagnosis Date  . Asthma   . CAD (coronary artery disease) 1999   stents to RCA, residual 40% Lt main, decreased EF40%   . Cataract    bilateral-beginning stages  . Colon polyps   . GERD (gastroesophageal reflux disease)   . H/O cardiovascular stress test  1999/2005/2008/2011   last with inf scar/infarct, EF 54%  . H/O echocardiogram 2008   EF 50-55%, Mild MR, Mild TR,    . Hemorrhoids   . 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 Hyperlipemia   . Hypertension   . lung ca dx'd 10/2008   chemo comp 01/2009-stage 4 lung cancer  . Myocardial infarction ([place]) 02-18-1998  . Peptic stricture of esophagus   . Right carotid bruit 04/01/12   minimal carotid stenosis on doppler    ALLERGIES:  is allergic to lipitor [atorvastatin calcium], amoxicillin, and atorvastatin.  MEDICATIONS:  Current Outpatient Medications  Medication Sig Dispense Refill  . amLODipine (NORVASC) 10 MG tablet Take 1 tablet (10 mg total) by mouth daily. 30 tablet 2  . aspirin 325 MG EC tablet Take 1 tablet (325 mg total) by mouth daily. Please hold for 15 days when you are on Eliquis, resume on 10/11 once you are off Eliquis.    Marland Kitchen ergocalciferol (VITAMIN D2) 1.25 MG (50000 UT) capsule Take 50,000 Units by mouth once a week. Friday    . esomeprazole (NEXIUM) 40 MG capsule Take 40 mg by mouth daily at 12 noon.    . nitroGLYCERIN (NITROSTAT) 0.4 MG SL tablet Place 1 tablet (0.4 mg total) under the tongue every 5 (five) minutes as needed for chest pain. 90 tablet 3  . oxybutynin (DITROPAN) 5 MG tablet Take 5 mg by mouth every 8 (eight) hours as needed for bladder spasms.   2  . phenylephrine (SUDAFED PE) 10 MG TABS tablet  Take 10 mg by mouth every 4 (four) hours as needed (sinus congestion).    . Prenatal Multivit-Min-Fe-FA (PRENATAL 1 + IRON PO) Take 1 tablet by mouth daily.     Current Facility-Administered Medications  Medication Dose Route Frequency Provider Last Rate Last Admin  . 0.9 %  sodium chloride infusion  500 mL Intravenous Once [name], MD        SURGICAL HISTORY:  Past Surgical History:  Procedure Laterality Date  . ANGIOPLASTY     with 2 stent placement  . APPLICATION OF WOUND VAC Right 05/11/2020   Procedure: APPLICATION OF WOUND  VAC;  Surgeon: [name], MD;  Location: [facility];  Service: Thoracic;  Laterality: Right;  . APPLICATION OF WOUND VAC Right 05/14/2020   Procedure: WOUND VAC CHANGE;  Surgeon: [name], MD;  Location: [facility];  Service: Thoracic;  Laterality: Right;  . APPLICATION OF WOUND VAC Right 05/17/2020   Procedure: WOUND VAC CHANGE;  Surgeon: [name], MD;  Location: [facility];  Service: Thoracic;  Laterality: Right;  . APPLICATION OF WOUND VAC Right 05/21/2020   Procedure: WOUND VAC CHANGE RIGHT CHEST;  Surgeon: [name], MD;  Location: [facility];  Service: Vascular;  Laterality: Right;  . COLONOSCOPY    . CORONARY ANGIOPLASTY WITH STENT PLACEMENT  02/18/1998   PCI/stents to ostial and mid RCA, emergently, residual Lt main disease  . EMPYEMA DRAINAGE Right 05/11/2020   Procedure: EMPYEMA DRAINAGE;  Surgeon: [name], MD;  Location: [facility];  Service: Thoracic;  Laterality: Right;  . LUNG SURGERY     right  . POLYPECTOMY     HPP 09-21-08  . RIB RESECTION Right 05/11/2020   Procedure: RIB RESECTION;  Surgeon: [name], MD;  Location: [facility];  Service: Thoracic;  Laterality: Right;  . THORACOTOMY Right 05/11/2020   Procedure: THORACOTOMY;  Surgeon: [name], MD;  [facility];  Service: Thoracic;  Laterality: Right;    REVIEW OF SYSTEMS:  A comprehensive review of systems was negative except for: Constitutional: positive for fatigue Respiratory: positive for cough and dyspnea on exertion   PHYSICAL EXAMINATION: General appearance: alert, cooperative, fatigued and no distress Head: Normocephalic, without obvious abnormality, atraumatic Neck: no adenopathy Lymph nodes: Cervical, supraclavicular, and axillary nodes normal. Resp: clear to auscultation bilaterally Back: symmetric, no curvature. ROM normal. No CVA tenderness. Cardio: regular rate and rhythm, S1, S2 normal, no murmur, click, rub or gallop GI: soft, non-tender;  bowel sounds normal; no masses,  no organomegaly Extremities: extremities normal, atraumatic, no cyanosis or edema  ECOG PERFORMANCE STATUS: 1 - Symptomatic but completely ambulatory  Blood pressure 133/75, pulse 72, temperature 98.6 F (37 C), temperature source Tympanic, resp. rate 18, height 5\' 8"  (1.727 m), weight 157 lb 11.2 oz (71.5 kg), SpO2 100 %.  LABORATORY DATA: Lab Results  Component Value Date   WBC 4.7 07/30/2020   HGB 14.0 07/30/2020   HCT 44.3 07/30/2020   MCV 85.9 07/30/2020   PLT 208 07/30/2020      Chemistry      Component Value Date/Time   NA 141 07/30/2020 0806   NA 141 01/16/2017 0753   K 4.2 07/30/2020 0806   K 4.2 01/16/2017 0753   CL 106 07/30/2020 0806   CL 105 07/13/2012 0848   CO2 25 07/30/2020 0806   CO2 27 01/16/2017 0753   BUN 20 07/30/2020 0806   BUN 16.6 01/16/2017 0753   CREATININE 1.00 07/30/2020  7035   CREATININE 1.1 01/16/2017 0753      Component Value Date/Time   CALCIUM 10.0 07/30/2020 0806   CALCIUM 9.9 01/16/2017 0753   ALKPHOS 91 07/30/2020 0806   ALKPHOS 95 01/16/2017 0753   AST 16 07/30/2020 0806   AST 15 01/16/2017 0753   ALT 11 07/30/2020 0806   ALT 11 01/16/2017 0753   BILITOT 0.5 07/30/2020 0806   BILITOT 0.75 01/16/2017 0753       RADIOGRAPHIC STUDIES: DG Chest 2 View  Result Date: 07/05/2020 CLINICAL DATA:  Follow-up empyema EXAM: CHEST - 2 VIEW COMPARISON:  06/12/2020 FINDINGS: Heart and mediastinal shadows are normal. The left chest is clear. Chronic pleural and parenchymal density on the right, similar to the previous study. No worsening or new finding allowing for technical differences. IMPRESSION: Chronic pleural and parenchymal density on the right. No worsening or new finding. Electronically Signed   By: Nelson Chimes M.D.   On: 07/05/2020 15:49   CT Chest Wo Contrast  Result Date: 07/30/2020 CLINICAL DATA:  History of lung cancer and prior partial lung resection, also with history of empyema with chest  wall involvement. EXAM: CT CHEST WITHOUT CONTRAST TECHNIQUE: Multidetector CT imaging of the chest was performed following the standard protocol without IV contrast. COMPARISON:  May 08, 2020. FINDINGS: Cardiovascular: Calcified atheromatous plaque in the thoracic aorta. No aneurysmal dilation. Normal caliber central pulmonary vessels. Normal heart size. Limited assessment of cardiovascular structures given lack of intravenous contrast. Mediastinum/Nodes: Small mediastinal lymph nodes, none displaying pathologic enlargement, largest along the RIGHT paratracheal chain (image 54, series 2) 9 mm, was 13 mm in 2021 in September. No mediastinal, hilar or axillary lymphadenopathy. Thoracic inlet structures are normal. Esophagus mildly patulous. Lungs/Pleura: Pulmonary emphysema. Signs of partial lung resection in the RIGHT chest with similar appearance. This is in the anterior RIGHT chest along the minor fissure extending to the junction of minor and major fissure. Since the previous study of November of 2021 there has been interval thoracotomy and postoperative changes of the RIGHT chest wall related to previous empyema and chest wall involvement. Abundant pleural thickening is present in the dependent RIGHT chest. No organized fluid collection. A low-density and some areas of calcification within pleural thickening. Trace effusion and or pleural thickening in the dependent RIGHT chest adjacent. There is some surrounding stranding in this location within the chest wall. Periosteal reaction about ribs is noted best seen on image 96 of series 5. Airways are patent. Upper Abdomen: Cholelithiasis.  No acute upper abdominal process. Musculoskeletal: Signs of partial rib resection in the RIGHT chest. Some periosteal reaction along the medial aspect of the RIGHT tenth rib contiguous with pleural thickening that persists following resection of a fluid-filled cavity. No discrete fluid collection. IMPRESSION: 1. Signs of  interval thoracotomy and postoperative changes along the RIGHT chest wall with residual pleural thickening extending to the skin surface in this patient with history of empyema with chest wall involvement. Findings may represent granulation tissue and reactive changes in adjacent ribs. Would correlate with any continued signs of infection. Could consider short interval follow-up with contrasted imaging to assess for small areas of fluid density which may persist. 2. Periosteal reaction as above within the tenth rib could relate to prior chest wall involvement. Strictly speaking osteomyelitis is not excluded though these changes are confined to the area of rib resection. Attention on follow-up and correlation with any symptoms of infection is suggested. 3. Signs of previous partial lung resection. 4. Diminished size  of mediastinal lymph nodes, likely reactive. 5. Pulmonary emphysema and aortic atherosclerosis. 6. Cholelithiasis. These results will be called to the ordering clinician or representative by the Radiologist Assistant, and communication documented in the PACS or [employer]. Aortic Atherosclerosis (ICD10-I70.0) and Emphysema (ICD10-J43.9). Electronically Signed   By: Zetta Bills M.D.   On: 07/30/2020 10:12   ASSESSMENT AND PLAN: This is a very pleasant 80 years old white male with history of metastatic non-small cell lung cancer status post neoadjuvant chemotherapy followed by right middle lobectomy with lymph node dissection has been observation since August of 2010 with no evidence for disease recurrence.  The patient has been in observation for more than 10 years now.  He was recently treated for pneumonia and drainage of right-sided empyema. He had repeat CT scan of the chest performed recently.  It showed no evidence for disease recurrence or metastasis but there was residual pleural thickening extending to the skin service in this patient with history of empyema and chest wall involvement  the finding could be reactive in nature but also the radiologist recommend short term follow-up with imaging studies. I discussed the scan results with the patient and recommended for him to have repeat CT scan of the chest in 3 months for further evaluation of his condition. He was advised to call immediately if he has any other concerning symptoms in the interval. All questions were answered. The patient knows to call the clinic with any problems, questions or concerns. We can certainly see the patient much sooner if necessary.  Disclaimer: This note was dictated with voice recognition software. Similar sounding words can inadvertently be transcribed and may be missed upon review.

## 2020-08-01 ENCOUNTER — Telehealth: Payer: Self-pay | Admitting: Internal Medicine

## 2020-08-01 NOTE — Telephone Encounter (Signed)
Scheduled appointments per 1/25 los. Spoke to patient who is aware of appointments dates and times.

## 2020-08-28 ENCOUNTER — Ambulatory Visit: Payer: Medicare Other | Admitting: Thoracic Surgery (Cardiothoracic Vascular Surgery)

## 2020-08-28 ENCOUNTER — Other Ambulatory Visit: Payer: Self-pay

## 2020-08-28 ENCOUNTER — Encounter: Payer: Self-pay | Admitting: Thoracic Surgery (Cardiothoracic Vascular Surgery)

## 2020-08-28 VITALS — BP 136/77 | HR 66 | Resp 20 | Ht 68.0 in | Wt 159.0 lb

## 2020-08-28 DIAGNOSIS — Z9889 Other specified postprocedural states: Secondary | ICD-10-CM

## 2020-08-28 DIAGNOSIS — Z5189 Encounter for other specified aftercare: Secondary | ICD-10-CM | POA: Diagnosis not present

## 2020-08-28 NOTE — Progress Notes (Signed)
[address]       [place] [address]             [phone]     HPI: Mr. Bunn returns for follow-up of his empyema.  Ryot Burrous is a 80 year old man who has remote history of lung cancer. He presented in the fall with pneumonia complicated by an empyema. It was initially incompletely treated with a pigtail catheter antibiotics. 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 I did a modified Eloesser procedure with rib resection and VAC placement on 05/11/2020. He was treated with multiple weeks of intravenous antibiotics and then completed a course of oral antibiotics as well. He tolerated the surgery and dressing changes remarkably well with minimal discomfort. I last saw him in the office on 07/30/2020 and the wound was nearly completely healed.  He continues to feel well. He is not having any pain. Appetite is good and he has started to gain back a little weight.  Past Medical History:  Diagnosis Date  . Asthma   . CAD (coronary artery disease) 1999   stents to RCA, residual 40% Lt main, decreased EF40%   . Cataract    bilateral-beginning stages  . Colon polyps   . GERD (gastroesophageal reflux disease)   . H/O cardiovascular stress test 1999/2005/2008/2011   last with inf scar/infarct, EF 54%  . H/O echocardiogram 2008   EF 50-55%, Mild MR, Mild TR,    . Hemorrhoids   . 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 Hyperlipemia   . Hypertension   . lung ca dx'd 10/2008   chemo comp 01/2009-stage 4 lung cancer  . Myocardial infarction ([facility]) 02-18-1998  . Peptic stricture of esophagus   . Right carotid bruit 04/01/12   minimal carotid stenosis on doppler    Current Outpatient Medications  Medication Sig Dispense Refill  . amLODipine (NORVASC) 10 MG tablet Take 1 tablet (10 mg total) by mouth daily. 30 tablet 2  . aspirin 325 MG EC tablet Take 1 tablet (325 mg total) by mouth daily. Please hold for 15 days when you are on  Eliquis, resume on 10/11 once you are off Eliquis.    Marland Kitchen ergocalciferol (VITAMIN D2) 1.25 MG (50000 UT) capsule Take 50,000 Units by mouth once a week. Friday    . esomeprazole (NEXIUM) 40 MG capsule Take 40 mg by mouth daily at 12 noon.    . nitroGLYCERIN (NITROSTAT) 0.4 MG SL tablet Place 1 tablet (0.4 mg total) under the tongue every 5 (five) minutes as needed for chest pain. 90 tablet 3  . oxybutynin (DITROPAN) 5 MG tablet Take 5 mg by mouth every 8 (eight) hours as needed for bladder spasms.   2  . phenylephrine (SUDAFED PE) 10 MG TABS tablet Take 10 mg by mouth every 4 (four) hours as needed (sinus congestion).    . Prenatal Multivit-Min-Fe-FA (PRENATAL 1 + IRON PO) Take 1 tablet by mouth daily.    . simvastatin (ZOCOR) 40 MG tablet Take 40 mg by mouth daily.     Current Facility-Administered Medications  Medication Dose Route Frequency Provider Last Rate Last Admin  . 0.9 %  sodium chloride infusion  500 mL Intravenous Once [name], MD        Physical Exam BP 136/77 (BP Location: Right Arm, Patient Position: Sitting)   Pulse 66   Resp 20   Ht 5\' 8"  (1.727 m)   Wt 159 lb (  72.1 kg)   SpO2 97% Comment: RA  BMI 24.63 kg/m  80 year old man in no acute distress Alert and oriented x3 with no focal deficits Wound healed with significant indentation of skin in the region of the wound  Diagnostic Tests: I reviewed his CT chest from 07/30/2020. Findings are consistent with postoperative changes. Nothing to suggest recurrent cancer.  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 He had a modified Eloesser procedure using a VAC. That wound eventually completely healed. I am very pleased with the result of both clinically and on his CT scan considering the severity of his infection and the nature of his surgery.  Plan: I will plan to see him back  in about 3 months after his repeat CT is done.  [name], MD [facility] [phone]

## 2020-09-04 ENCOUNTER — Encounter: Payer: Self-pay | Admitting: Cardiovascular Disease

## 2020-09-04 ENCOUNTER — Other Ambulatory Visit: Payer: Self-pay

## 2020-09-04 ENCOUNTER — Ambulatory Visit: Payer: Medicare Other | Admitting: Cardiovascular Disease

## 2020-09-04 VITALS — BP 142/72 | HR 71 | Ht 70.0 in | Wt 158.0 lb

## 2020-09-04 DIAGNOSIS — I10 Essential (primary) hypertension: Secondary | ICD-10-CM | POA: Diagnosis not present

## 2020-09-04 DIAGNOSIS — I2510 Atherosclerotic heart disease of native coronary artery without angina pectoris: Secondary | ICD-10-CM

## 2020-09-04 DIAGNOSIS — I2111 ST elevation (STEMI) myocardial infarction involving right coronary artery: Secondary | ICD-10-CM

## 2020-09-04 LAB — HEPATIC FUNCTION PANEL
ALT: 9 IU/L (ref 0–44)
AST: 15 IU/L (ref 0–40)
Albumin: 4.4 g/dL (ref 3.7–4.7)
Alkaline Phosphatase: 93 IU/L (ref 44–121)
Bilirubin Total: 0.6 mg/dL (ref 0.0–1.2)
Bilirubin, Direct: 0.17 mg/dL (ref 0.00–0.40)
Total Protein: 7.6 g/dL (ref 6.0–8.5)

## 2020-09-04 LAB — LIPID PANEL
Chol/HDL Ratio: 3.5 ratio (ref 0.0–5.0)
Cholesterol, Total: 148 mg/dL (ref 100–199)
HDL: 42 mg/dL (ref >39)
LDL Chol Calc (NIH): 95 mg/dL (ref 0–99)
Triglycerides: 54 mg/dL (ref 0–149)
VLDL Cholesterol Cal: 11 mg/dL (ref 5–40)

## 2020-09-04 MED ORDER — NITROGLYCERIN 0.4 MG SL SUBL: 0.4000 mg | SUBLINGUAL_TABLET | SUBLINGUAL | 3 refills | Status: AC | PRN

## 2020-09-04 NOTE — Progress Notes (Signed)
09/04/2020 JOHNROSS Hall   09/26/1940  [id number]  Primary Physician [name], MD Primary Cardiologist: Lorretta Harp MD [name], [place]  HPI:  Mathew Hall is a 80 y.o.  mildly overweight married Caucasian male father of 2 living children both of whom are daughters (both sons died at the age of 87), grandfather of 8 grandchildren who I  last saw in the office 09/02/2019.  He is accompanied by his wife Cull who is also a patient of mine.  He has a history of CAD status post acute inferior wall myocardial infarction treated percutaneously with stenting February 18, 1998 with intervention of his ostium and mid dominant RCA. He did have a 40% ostial left main stenosis and an EF of 40%, inferior akinesis at that time. He had a normal abdominal aorta, renal arteries and iliac arteries. Followup echo performed in 2008 showed normal LV systolic function, and his most recent Myoview performed Nov 21, 2009 was normal as well. His other problems include hypertension, hyperlipidemia and GERD.   Since I saw him a year ago he continues to do well.  He is followed by Dr. Earlie Server for his lung cancer which he has been free from for 13 years.  He recently had an empyema and had a VATS procedure by Dr. Roxan Hockey late last year. He denies chest pain or shortness of breath.   Current Meds  Medication Sig  . amLODipine (NORVASC) 10 MG tablet Take 1 tablet (10 mg total) by mouth daily.  Marland Kitchen aspirin 325 MG EC tablet Take 1 tablet (325 mg total) by mouth daily. Please hold for 15 days when you are on Eliquis, resume on 10/11 once you are off Eliquis.  Marland Kitchen ergocalciferol (VITAMIN D2) 1.25 MG (50000 UT) capsule Take 50,000 Units by mouth once a week. Friday  . esomeprazole (NEXIUM) 40 MG capsule Take 40 mg by mouth daily at 12 noon.  . nitroGLYCERIN (NITROSTAT) 0.4 MG SL tablet Place 1 tablet (0.4 mg total) under the tongue every 5 (five) minutes as needed for chest pain.  Marland Kitchen oxybutynin  (DITROPAN) 5 MG tablet Take 5 mg by mouth every 8 (eight) hours as needed for bladder spasms.   . phenylephrine (SUDAFED PE) 10 MG TABS tablet Take 10 mg by mouth every 4 (four) hours as needed (sinus congestion).  . Prenatal Multivit-Min-Fe-FA (PRENATAL 1 + IRON PO) Take 1 tablet by mouth daily.  . simvastatin (ZOCOR) 40 MG tablet Take 40 mg by mouth daily.   Current Facility-Administered Medications for the 09/04/20 encounter (Office Visit) with Lorretta Harp, MD  Medication  . 0.9 %  sodium chloride infusion     Allergies  Allergen Reactions  . Lipitor [Atorvastatin Calcium] Other (See Comments)    Knee pain   . Amoxicillin Rash  . Atorvastatin Other (See Comments)    Joint/knee pain    Social History   Socioeconomic History  . Marital status: Married    Spouse name: Not on file  . Number of children: 2  . Years of education: Not on file  . Highest education level: Not on file  Occupational History  . Occupation: retired  Tobacco Use  . Smoking status: Former Smoker    Packs/day: 2.00    Types: Cigarettes    Quit date: 07/16/1997    Years since quitting: 23.1  . Smokeless tobacco: Never Used  Vaping Use  . Vaping Use: Never used  Substance and Sexual Activity  . Alcohol  use: Yes    Comment: occasionally  . Drug use: No  . Sexual activity: Not on file  Other Topics Concern  . Not on file  Social History Narrative  . Not on file   Social Determinants of Health   Financial Resource Strain: Not on file  Food Insecurity: Not on file  Transportation Needs: Not on file  Physical Activity: Not on file  Stress: Not on file  Social Connections: Not on file  Intimate Partner Violence: Not on file     Review of Systems: General: negative for chills, fever, night sweats or weight changes.  Cardiovascular: negative for chest pain, dyspnea on exertion, edema, orthopnea, palpitations, paroxysmal nocturnal dyspnea or shortness of breath Dermatological: negative for  rash Respiratory: negative for cough or wheezing Urologic: negative for hematuria Abdominal: negative for nausea, vomiting, diarrhea, bright red blood per rectum, melena, or hematemesis Neurologic: negative for visual changes, syncope, or dizziness All other systems reviewed and are otherwise negative except as noted above.    Blood pressure (!) 142/72, pulse 71, height 5\' 10"  (1.778 m), weight 158 lb (71.7 kg), SpO2 96 %.  General appearance: alert and no distress Neck: no adenopathy, no carotid bruit, no JVD, supple, symmetrical, trachea midline and thyroid not enlarged, symmetric, no tenderness/mass/nodules Lungs: clear to auscultation bilaterally Heart: regular rate and rhythm, S1, S2 normal, no murmur, click, rub or gallop Extremities: extremities normal, atraumatic, no cyanosis or edema Pulses: 2+ and symmetric Skin: Skin color, texture, turgor normal. No rashes or lesions Neurologic: Alert and oriented X 3, normal strength and tone. Normal symmetric reflexes. Normal coordination and gait  EKG sinus rhythm at 71 with right bundle branch block.  I personally reviewed this EKG.  ASSESSMENT AND PLAN:   HYPERLIPIDEMIA History of hyperlipidemia on statin therapy.  We will recheck a lipid liver profile this morning.  Essential hypertension History of essential hypertension a blood pressure measured today at 142/72.  He is on amlodipine.  Acute myocardial infarction History of CAD status post inferior STEMI 02/18/1998 treated with dominant RCA ostial and mid intervention by myself.  He had 40% ostial left main stenosis and EF of 40% with inferior akinesia at that time.  Follow-up echo performed in 2008 showed normal LV systolic function and a Myoview stress test performed 11/21/2009 was normal as well.  He denies chest pain or shortness of breath.      Lorretta Harp MD FACP,FACC,FAHA, [facility] 09/04/2020 10:41 AM

## 2020-09-04 NOTE — Assessment & Plan Note (Signed)
History of hyperlipidemia on statin therapy.  We will recheck a lipid liver profile this morning.

## 2020-09-04 NOTE — Assessment & Plan Note (Signed)
History of CAD status post inferior STEMI 02/18/1998 treated with dominant RCA ostial and mid intervention by myself.  He had 40% ostial left main stenosis and EF of 40% with inferior akinesia at that time.  Follow-up echo performed in 2008 showed normal LV systolic function and a Myoview stress test performed 11/21/2009 was normal as well.  He denies chest pain or shortness of breath.

## 2020-09-04 NOTE — Patient Instructions (Signed)
Medication Instructions:  Your physician recommends that you continue on your current medications as directed. Please refer to the Current Medication list given to you today.  *If you need a refill on your cardiac medications before your next appointment, please call your pharmacy*   Lab Work: Your physician recommends that you have labs drawn today: lipid/liver profile.  If you have labs (blood work) drawn today and your tests are completely normal, you will receive your results only by: Marland Kitchen MyChart Message (if you have MyChart) OR . A paper copy in the mail If you have any lab test that is abnormal or we need to change your treatment, we will call you to review the results.   Follow-Up: At [facility], you and your health needs are our priority.  As part of our continuing mission to provide you with exceptional heart care, we have created designated Provider Care Teams.  These Care Teams include your primary Cardiologist (physician) and Advanced Practice Providers (APPs -  Physician Assistants and Nurse Practitioners) who all work together to provide you with the care you need, when you need it.  We recommend signing up for the patient portal called "MyChart".  Sign up information is provided on this After Visit Summary.  MyChart is used to connect with patients for Virtual Visits (Telemedicine).  Patients are able to view lab/test results, encounter notes, upcoming appointments, etc.  Non-urgent messages can be sent to your provider as well.   To learn more about what you can do with MyChart, go to [email or web address].    Your next appointment:   12 month(s)  The format for your next appointment:   In Person  Provider:   Quay Burow, MD

## 2020-09-04 NOTE — Assessment & Plan Note (Signed)
History of essential hypertension a blood pressure measured today at 142/72.  He is on amlodipine.

## 2020-09-19 ENCOUNTER — Telehealth: Payer: Self-pay

## 2020-09-19 NOTE — Telephone Encounter (Signed)
Called pt regarding lipid panel. Pt states that he just recently went to his PCP (Dr. Philip Aspen) for a physical and states that new cholesterol panel was drawn and that Dr. Philip Aspen was pleased with numbers and does not want to change any medications at this time. Pt would like to move forward per Dr. Shon Baton advice. Will request labs from [facility].

## 2020-10-26 ENCOUNTER — Inpatient Hospital Stay: Payer: Medicare Other | Attending: Internal Medicine

## 2020-10-26 ENCOUNTER — Other Ambulatory Visit: Payer: Self-pay

## 2020-10-26 ENCOUNTER — Ambulatory Visit (HOSPITAL_COMMUNITY)
Admission: RE | Admit: 2020-10-26 | Discharge: 2020-10-26 | Disposition: A | Discharge status: Home / Self Care | Payer: Medicare Other | Source: Ambulatory Visit | Attending: Internal Medicine | Admitting: Internal Medicine

## 2020-10-26 ENCOUNTER — Encounter (HOSPITAL_COMMUNITY): Payer: Self-pay

## 2020-10-26 DIAGNOSIS — Z85118 Personal history of other malignant neoplasm of bronchus and lung: Secondary | ICD-10-CM | POA: Diagnosis present

## 2020-10-26 DIAGNOSIS — C3490 Malignant neoplasm of unspecified part of unspecified bronchus or lung: Secondary | ICD-10-CM

## 2020-10-26 DIAGNOSIS — Z08 Encounter for follow-up examination after completed treatment for malignant neoplasm: Secondary | ICD-10-CM | POA: Insufficient documentation

## 2020-10-26 DIAGNOSIS — Z9221 Personal history of antineoplastic chemotherapy: Secondary | ICD-10-CM | POA: Diagnosis not present

## 2020-10-26 DIAGNOSIS — Z902 Acquired absence of lung [part of]: Secondary | ICD-10-CM | POA: Diagnosis not present

## 2020-10-26 LAB — CBC WITH DIFFERENTIAL (CANCER CENTER ONLY)
Abs Immature Granulocytes: 0.02 10*3/uL (ref 0.00–0.07)
Basophils Absolute: 0.1 10*3/uL (ref 0.0–0.1)
Basophils Relative: 1 %
Eosinophils Absolute: 0.2 10*3/uL (ref 0.0–0.5)
Eosinophils Relative: 4 %
HCT: 41.1 % (ref 39.0–52.0)
Hemoglobin: 13.7 g/dL (ref 13.0–17.0)
Immature Granulocytes: 1 %
Lymphocytes Relative: 39 %
Lymphs Abs: 1.7 10*3/uL (ref 0.7–4.0)
MCH: 29.1 pg (ref 26.0–34.0)
MCHC: 33.3 g/dL (ref 30.0–36.0)
MCV: 87.3 fL (ref 80.0–100.0)
Monocytes Absolute: 0.5 10*3/uL (ref 0.1–1.0)
Monocytes Relative: 11 %
Neutro Abs: 1.9 10*3/uL (ref 1.7–7.7)
Neutrophils Relative %: 44 %
Platelet Count: 192 10*3/uL (ref 150–400)
RBC: 4.71 MIL/uL (ref 4.22–5.81)
RDW: 13.5 % (ref 11.5–15.5)
WBC Count: 4.2 10*3/uL (ref 4.0–10.5)
nRBC: 0 % (ref 0.0–0.2)

## 2020-10-26 LAB — CMP (CANCER CENTER ONLY)
ALT: 10 U/L (ref 0–44)
AST: 17 U/L (ref 15–41)
Albumin: 4.3 g/dL (ref 3.5–5.0)
Alkaline Phosphatase: 89 U/L (ref 38–126)
Anion gap: 8 (ref 5–15)
BUN: 19 mg/dL (ref 8–23)
CO2: 26 mmol/L (ref 22–32)
Calcium: 9.5 mg/dL (ref 8.9–10.3)
Chloride: 106 mmol/L (ref 98–111)
Creatinine: 1.03 mg/dL (ref 0.61–1.24)
GFR, Estimated: >60 mL/min (ref >60)
Glucose, Bld: 103 mg/dL — ABNORMAL HIGH (ref 70–99)
Potassium: 4.2 mmol/L (ref 3.5–5.1)
Sodium: 140 mmol/L (ref 135–145)
Total Bilirubin: 0.9 mg/dL (ref 0.3–1.2)
Total Protein: 8 g/dL (ref 6.5–8.1)

## 2020-10-29 ENCOUNTER — Ambulatory Visit: Payer: Medicare Other | Admitting: Internal Medicine

## 2020-10-31 ENCOUNTER — Encounter: Payer: Self-pay | Admitting: Internal Medicine

## 2020-10-31 ENCOUNTER — Other Ambulatory Visit: Payer: Self-pay

## 2020-10-31 ENCOUNTER — Inpatient Hospital Stay: Payer: Medicare Other | Admitting: Internal Medicine

## 2020-10-31 VITALS — BP 144/69 | HR 74 | Temp 97.7°F | Resp 19 | Ht 70.0 in | Wt 168.6 lb

## 2020-10-31 DIAGNOSIS — Z08 Encounter for follow-up examination after completed treatment for malignant neoplasm: Secondary | ICD-10-CM | POA: Diagnosis not present

## 2020-10-31 DIAGNOSIS — C342 Malignant neoplasm of middle lobe, bronchus or lung: Secondary | ICD-10-CM | POA: Diagnosis not present

## 2020-10-31 NOTE — Progress Notes (Signed)
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   PRIOR THERAPY:  1. Status post 3 cycles of neoadjuvant chemotherapy with carboplatin and Alimta; last dose was given January 10, 2009, with complete resolution of the left upper lobe mass as well as the subcarinal lymphadenopathy and partial response in the right middle lobe lung lesion. 2. Status post right middle lobectomy with lymph node dissection under the care of Dr. 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  CURRENT THERAPY: Observation.  INTERVAL HISTORY: Mathew Hall 80 y.o. male returns to the clinic today for follow-up visit.  The patient is feeling fine today with no concerning complaints except for mild fatigue.  He denied having any current chest pain, shortness of breath, cough or hemoptysis.  He denied having any weight loss or night sweats.  He has no nausea, vomiting, diarrhea or constipation.  He denied having any headache or visual changes.  He celebrated his 80th birthday 2 days ago.  The patient had repeat CT scan of the chest performed recently and he is here for evaluation and discussion of his scan results.  MEDICAL HISTORY: Past Medical History:  Diagnosis Date  . Asthma   . CAD (coronary artery disease) 1999   stents to RCA, residual 40% Lt main, decreased EF40%   . Cataract    bilateral-beginning stages  . Colon polyps   . GERD (gastroesophageal reflux disease)   . H/O cardiovascular stress test 1999/2005/2008/2011   last with inf scar/infarct, EF 54%  . H/O echocardiogram 2008   EF 50-55%, Mild MR, Mild TR,    . Hemorrhoids   . 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 Hyperlipemia   . Hypertension   . lung ca dx'd 10/2008   chemo comp 01/2009-stage 4 lung cancer  . Myocardial infarction ([place]) 02-18-1998  . Peptic stricture of esophagus   . Right carotid bruit 04/01/12   minimal carotid stenosis on doppler    ALLERGIES:  is allergic to lipitor [atorvastatin calcium], other, amoxicillin, and atorvastatin.  MEDICATIONS:  Current Outpatient Medications  Medication Sig Dispense Refill  . amLODipine (NORVASC) 10 MG tablet Take 1 tablet (10 mg total) by mouth daily. 30 tablet 2  . aspirin 325 MG EC tablet Take 1 tablet (325 mg total) by mouth daily. Please hold for 15 days when you are on Eliquis, resume on 10/11 once you are off Eliquis.    Marland Kitchen ergocalciferol (VITAMIN D2) 1.25 MG (50000 UT) capsule Take 50,000 Units by mouth once a week. Friday    . esomeprazole (NEXIUM) 40 MG capsule Take 40 mg by mouth daily at 12 noon.    . nitroGLYCERIN (NITROSTAT) 0.4 MG SL tablet Place 1 tablet (0.4 mg total) under the tongue every 5 (five) minutes as needed for chest pain. 90 tablet 3  . oxybutynin (DITROPAN) 5 MG tablet Take 5 mg by mouth every 8 (eight) hours as needed for bladder spasms.   2  . phenylephrine (SUDAFED PE) 10 MG TABS tablet Take 10 mg by mouth every 4 (four) hours as needed (sinus congestion).    . Prenatal Multivit-Min-Fe-FA (PRENATAL 1 + IRON PO) Take 1 tablet by mouth daily.    Marland Kitchen  simvastatin (ZOCOR) 40 MG tablet Take 40 mg by mouth daily.     Current Facility-Administered Medications  Medication Dose Route Frequency Provider Last Rate Last Admin  . 0.9 %  sodium chloride infusion  500 mL Intravenous Once [name], MD        SURGICAL HISTORY:  Past Surgical History:  Procedure Laterality Date  . ANGIOPLASTY     with 2 stent placement  . APPLICATION OF WOUND VAC Right 05/11/2020   Procedure: APPLICATION OF WOUND VAC;  Surgeon: [name], MD;  Location: [facility];  Service: Thoracic;  Laterality: Right;  .  APPLICATION OF WOUND VAC Right 05/14/2020   Procedure: WOUND VAC CHANGE;  Surgeon: [name], MD;  Location: [place];  Service: Thoracic;  Laterality: Right;  . APPLICATION OF WOUND VAC Right 05/17/2020   Procedure: WOUND VAC CHANGE;  Surgeon: [name], MD;  Location: [place];  Service: Thoracic;  Laterality: Right;  . APPLICATION OF WOUND VAC Right 05/21/2020   Procedure: WOUND VAC CHANGE RIGHT CHEST;  Surgeon: [name], MD;  Location: [place];  Service: Vascular;  Laterality: Right;  . COLONOSCOPY    . CORONARY ANGIOPLASTY WITH STENT PLACEMENT  02/18/1998   PCI/stents to ostial and mid RCA, emergently, residual Lt main disease  . EMPYEMA DRAINAGE Right 05/11/2020   Procedure: EMPYEMA DRAINAGE;  Surgeon: [name], MD;  Location: [place];  Service: Thoracic;  Laterality: Right;  . LUNG SURGERY     right  . POLYPECTOMY     HPP 09-21-08  . RIB RESECTION Right 05/11/2020   Procedure: RIB RESECTION;  Surgeon: [name], MD;  Location: [facility];  Service: Thoracic;  Laterality: Right;  . THORACOTOMY Right 05/11/2020   Procedure: THORACOTOMY;  Surgeon: [name], MD;  Location: [facility] OR;  Service: Thoracic;  Laterality: Right;    REVIEW OF SYSTEMS:  A comprehensive review of systems was negative except for: Constitutional: positive for fatigue Respiratory: positive for dyspnea on exertion   PHYSICAL EXAMINATION: General appearance: alert, cooperative, fatigued and no distress Head: Normocephalic, without obvious abnormality, atraumatic Neck: no adenopathy Lymph nodes: Cervical, supraclavicular, and axillary nodes normal. Resp: clear to auscultation bilaterally Back: symmetric, no curvature. ROM normal. No CVA tenderness. Cardio: regular rate and rhythm, S1, S2 normal, no murmur, click, rub or gallop GI: soft, non-tender; bowel sounds normal; no masses,  no organomegaly Extremities: extremities normal, atraumatic, no cyanosis or  edema  ECOG PERFORMANCE STATUS: 1 - Symptomatic but completely ambulatory  Blood pressure (!) 144/69, pulse 74, temperature 97.7 F (36.5 C), temperature source Oral, resp. rate 19, height 5\' 10"  (1.778 m), weight 168 lb 9.6 oz (76.5 kg), SpO2 99 %.  LABORATORY DATA: Lab Results  Component Value Date   WBC 4.2 10/26/2020   HGB 13.7 10/26/2020   HCT 41.1 10/26/2020   MCV 87.3 10/26/2020   PLT 192 10/26/2020      Chemistry      Component Value Date/Time   NA 140 10/26/2020 0857   NA 141 01/16/2017 0753   K 4.2 10/26/2020 0857   K 4.2 01/16/2017 0753   CL 106 10/26/2020 0857   CL 105 07/13/2012 0848   CO2 26 10/26/2020 0857   CO2 27 01/16/2017 0753   BUN 19 10/26/2020 0857   BUN 16.6 01/16/2017 0753   CREATININE 1.03 10/26/2020 0857   CREATININE 1.1 01/16/2017 0753      Component Value Date/Time   CALCIUM 9.5 10/26/2020  0857   CALCIUM 9.9 01/16/2017 0753   ALKPHOS 89 10/26/2020 0857   ALKPHOS 95 01/16/2017 0753   AST 17 10/26/2020 0857   AST 15 01/16/2017 0753   ALT 10 10/26/2020 0857   ALT 11 01/16/2017 0753   BILITOT 0.9 10/26/2020 0857   BILITOT 0.75 01/16/2017 0753       RADIOGRAPHIC STUDIES: CT Chest Wo Contrast  Result Date: 10/26/2020 CLINICAL DATA:  Primary Cancer Type: Lung Imaging Indication: Routine surveillance Interval therapy since last imaging?  No Initial Cancer Diagnosis Date: 11/16/2008; Established by: Biopsy-proven Detailed Pathology: Metastatic nonsmall cell lung cancer, adenocarcinoma. Primary Tumor location: Right middle lobe. Surgeries: Right thoracotomy, drainage of empyema, rib resection, VAC placement 05/11/2020. Right middle lobectomy 03/05/2009. Chemotherapy: Yes; Ongoing? No; Most recent administration: 01/10/2009 Immunotherapy? No Radiation therapy? No EXAM: CT CHEST WITHOUT CONTRAST TECHNIQUE: Multidetector CT imaging of the chest was performed following the standard protocol without IV contrast. COMPARISON:  Most recent CT chest  07/30/2020.  02/02/2009 PET-CT. FINDINGS: Cardiovascular: Heart size is normal. There is no significant pericardial fluid, thickening or pericardial calcification. There is aortic atherosclerosis, as well as atherosclerosis of the great vessels of the mediastinum and the coronary arteries, including calcified atherosclerotic plaque in the left main, left anterior descending, left circumflex and right coronary arteries. Calcifications of the aortic valve and mitral annulus. Mediastinum/Nodes: No pathologically enlarged mediastinal or hilar lymph nodes. Esophagus is unremarkable in appearance. No axillary lymphadenopathy. Lungs/Pleura: Status post right middle lobectomy with compensatory hyperexpansion of the right upper and lower lobes. Tiny pulmonary nodules are again noted in the right upper lobe, largest of which measures only 3 mm (axial image 37 of series 5). No other larger more suspicious appearing pulmonary nodules or masses are noted. No acute consolidative airspace disease. Diffuse bronchial wall thickening with mild centrilobular and paraseptal emphysema. Chronic right-sided pleural thickening and architectural distortion in the posterolateral aspect of the lower right hemithorax, with resolution of the previously noted small right pleural effusion, and decreased prominence of abnormal soft tissue extending into the right flank, likely to represent resolved empyema and resolved bronchopleural fistula. Upper Abdomen: Aortic atherosclerosis. Small calcified granuloma measuring 6 mm lying dependently in the gallbladder. Musculoskeletal: There are no aggressive appearing lytic or blastic lesions noted in the visualized portions of the skeleton. IMPRESSION: 1. No findings to suggest recurrent disease or metastatic disease in the thorax. 2. Tiny stable right upper lobe pulmonary nodules, likely benign. 3. Resolved right-sided empyema and bronchopleural fistula. 4. Aortic atherosclerosis, in addition to left main  and 3 vessel coronary artery disease. Assessment for potential risk factor modification, dietary therapy or pharmacologic therapy may be warranted, if clinically indicated. 5. There are calcifications of the aortic valve and mitral annulus. Echocardiographic correlation for evaluation of potential valvular dysfunction may be warranted if clinically indicated. 6. Cholelithiasis. Electronically Signed   By: Vinnie Langton M.D.   On: 10/26/2020 11:24   ASSESSMENT AND PLAN: This is a very pleasant 80 years old white male with history of metastatic non-small cell lung cancer status post neoadjuvant chemotherapy followed by right middle lobectomy with lymph node dissection has been observation since August of 2010 with no evidence for disease recurrence.  The patient has been on observation for the last 12 years. He had repeat CT scan of the chest performed recently.  I personally and independently reviewed the scan and discussed the results with the patient today. His scan showed no concerning findings for disease recurrence or metastasis. I recommended for  the patient to continue on observation and follow-up visit with his primary care physician from now on. I will be happy to see him in the future if needed. He was advised to call immediately if he has any concerning symptoms in the interval. All questions were answered. The patient knows to call the clinic with any problems, questions or concerns. We can certainly see the patient much sooner if necessary.  Disclaimer: This note was dictated with voice recognition software. Similar sounding words can inadvertently be transcribed and may be missed upon review.

## 2020-11-13 ENCOUNTER — Other Ambulatory Visit: Payer: Self-pay

## 2020-11-13 ENCOUNTER — Encounter: Payer: Self-pay | Admitting: Thoracic Surgery (Cardiothoracic Vascular Surgery)

## 2020-11-13 ENCOUNTER — Ambulatory Visit: Payer: Medicare Other | Admitting: Thoracic Surgery (Cardiothoracic Vascular Surgery)

## 2020-11-13 DIAGNOSIS — Z9889 Other specified postprocedural states: Secondary | ICD-10-CM | POA: Insufficient documentation

## 2020-11-13 NOTE — Progress Notes (Signed)
[address]       [place] [address]             [phone]     HPI: Mathew Hall returns for scheduled follow-up visit regarding his empyema  Mathew Hall is a 80 year old man with a remote history of lung cancer (2010).  He presented last fall with pneumonia complicated by an empyema.  He was treated with a pigtail catheter and antibiotics initially.  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  I did a modified Eloesser procedure with VAC placement on 05/11/2020.  He had a prolonged course of intravenous followed by oral antibiotics.  I last saw him in the office in February.  He was doing well at that time and his wound had healed.  In the interim since his last visit, he saw Dr. Julien Nordmann.  No evidence of recurrent cancer.  He has been feeling well.  He does not have any residual pain related to his surgery.  He is not having any issues with his breathing.  No fevers or chills.  Past Medical History:  Diagnosis Date  . Asthma   . CAD (coronary artery disease) 1999   stents to RCA, residual 40% Lt main, decreased EF40%   . Cataract    bilateral-beginning stages  . Colon polyps   . GERD (gastroesophageal reflux disease)   . H/O cardiovascular stress test 1999/2005/2008/2011   last with inf scar/infarct, EF 54%  . H/O echocardiogram 2008   EF 50-55%, Mild MR, Mild TR,    . Hemorrhoids   . 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 Hyperlipemia   . Hypertension   . lung ca dx'd 10/2008   chemo comp 01/2009-stage 4 lung cancer  . Myocardial infarction ([place]) 02-18-1998  . Peptic stricture of esophagus   . Right carotid bruit 04/01/12   minimal carotid stenosis on doppler    Current Outpatient Medications  Medication Sig Dispense Refill  . amLODipine (NORVASC) 10 MG tablet Take 1 tablet (10 mg total) by mouth daily. 30 tablet 2  . aspirin 325 MG EC tablet Take 1 tablet (325 mg total) by mouth daily. Please hold for 15 days when  you are on Eliquis, resume on 10/11 once you are off Eliquis.    Marland Kitchen ergocalciferol (VITAMIN D2) 1.25 MG (50000 UT) capsule Take 50,000 Units by mouth once a week. Friday    . esomeprazole (NEXIUM) 40 MG capsule Take 40 mg by mouth daily at 12 noon.    . nitroGLYCERIN (NITROSTAT) 0.4 MG SL tablet Place 1 tablet (0.4 mg total) under the tongue every 5 (five) minutes as needed for chest pain. 90 tablet 3  . oxybutynin (DITROPAN) 5 MG tablet Take 5 mg by mouth every 8 (eight) hours as needed for bladder spasms.   2  . phenylephrine (SUDAFED PE) 10 MG TABS tablet Take 10 mg by mouth every 4 (four) hours as needed (sinus congestion).    . Prenatal Multivit-Min-Fe-FA (PRENATAL 1 + IRON PO) Take 1 tablet by mouth daily.    . simvastatin (ZOCOR) 40 MG tablet Take 40 mg by mouth daily.     Current Facility-Administered Medications  Medication Dose Route Frequency Provider Last Rate Last Admin  . 0.9 %  sodium chloride infusion  500 mL Intravenous Once [name], MD        Physical Exam BP (!) 151/75 (BP Location: Right Arm, Patient Position: Sitting, Cuff Size: Normal)  Pulse 65   Temp 98.2 F (36.8 C) (Skin)   Resp 20   Wt 168 lb (76.2 kg)   SpO2 99% Comment: RA  BMI 24.52 kg/m  80 year old man in no acute distress Alert and oriented x3 with no focal deficits Lungs diminished to right base, otherwise clear Chest wall incision healed Cardiac regular rate and rhythm  Diagnostic Tests: CHEST - 2 VIEW  COMPARISON:  06/12/2020  FINDINGS: Heart and mediastinal shadows are normal. The left chest is clear. Chronic pleural and parenchymal density on the right, similar to the previous study. No worsening or new finding allowing for technical differences.  IMPRESSION: Chronic pleural and parenchymal density on the right. No worsening or new finding.   Electronically Signed   By: Nelson Chimes M.D.   On: 07/05/2020 15:49 I personally reviewed the chest x-ray images.  There is some  scarring at the right base, but otherwise unremarkable.  Impression: Mathew Hall is a 80 year old man who had a modified Eloesser procedure for an empyema necessitatans back in November.  It took a couple of months for that to granulate in with VAC changes.  He tolerated it remarkably well.  His wound is completely healed and there is not having any issues with pain or other issues related to his surgery.  Plan: No further postoperative follow-up needed Follow-up with Dr. Philip Aspen I will be happy to see Mathew Hall back anytime in the future if I can be of any further assistance with his care  Mathew Nakayama, MD [facility] [phone]

## 2020-12-30 ENCOUNTER — Other Ambulatory Visit: Payer: Self-pay | Admitting: Cardiovascular Disease

## 2021-01-11 ENCOUNTER — Encounter: Payer: Self-pay | Admitting: Internal Medicine

## 2021-02-27 ENCOUNTER — Encounter: Payer: Self-pay | Admitting: Internal Medicine

## 2021-04-03 ENCOUNTER — Ambulatory Visit: Payer: Medicare Other | Admitting: Internal Medicine

## 2021-04-18 ENCOUNTER — Ambulatory Visit: Payer: Self-pay | Admitting: Surgery

## 2021-04-18 NOTE — H&P (View-Only) (Signed)
Mathew Hall [id number]   Referring Provider:  Elise Benne*   Subjective   Chief Complaint: Hernia   History of Present Illness: Very pleasant and active 80 year old man who has had a right inguinal hernia for at least 2 years.  Initially it was small, and was noticed by a physician treating him for prostate problems.  Over time however it has enlarged and has extended into his scrotum.  He denies any associated pain or GI symptoms.  Has not tried to reduce it.  He planned to have it fixed last year, but developed COVID-pneumonia and secondarily an empyema requiring thoracotomy by Dr. Roxan Hockey in November.  He reports that he did really well with this and did not have any problems once this was done.  No previous abdominal or hernia surgery.   Review of Systems: A complete review of systems was obtained from the patient.  I have reviewed this information and discussed as appropriate with the patient.  See HPI as well for other ROS.   Medical History: Past Medical History:  Diagnosis Date   Arthritis    GERD (gastroesophageal reflux disease)    History of cancer    Hyperlipidemia     There is no problem list on file for this patient.   History reviewed. No pertinent surgical history.   Allergies  Allergen Reactions   Atorvastatin Other (See Comments)    Join pain Knee pain Joint/knee pain    Other Unknown    Other reaction(s): flushing   Amoxicillin Rash    Current Outpatient Medications on File Prior to Visit  Medication Sig Dispense Refill   aspirin 325 MG tablet Take by mouth     simvastatin (ZOCOR) 40 MG tablet TAKE 1 TABLET (40 MG TOTAL) BY MOUTH DAILY AT 6 PM.     ergocalciferol, vitamin D2, 1,250 mcg (50,000 unit) capsule Take 50,000 Units by mouth every 7 (seven) days     No current facility-administered medications on file prior to visit.    Family History  Problem Relation Age of Onset   Skin cancer Father      Social History    Tobacco Use  Smoking Status Former Smoker   Quit date: 1999   Years since quitting: 23.7  Smokeless Tobacco Never Used     Social History   Socioeconomic History   Marital status: Married  Tobacco Use   Smoking status: Former Smoker    Quit date: 1999    Years since quitting: 23.7   Smokeless tobacco: Never Used  Scientific laboratory technician Use: Never used  Substance and Sexual Activity   Alcohol use: Not Currently   Drug use: Defer   Sexual activity: Defer    Objective:    Vitals:   04/18/21 1027  BP: 112/68  Pulse: 75  Temp: 36.9 C (98.5 F)  SpO2: 97%  Weight: 77.8 kg (171 lb 9.6 oz)  Height: 177.8 cm (5\' 10" )    Body mass index is 24.62 kg/m.  Alert, well-appearing Unlabored respirations Abdomen soft and nontender.  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  No hernia on the left.  Assessment and Plan:  Diagnoses and all orders for this visit:  Non-recurrent unilateral inguinal hernia without obstruction or gangrene -     CCS Case Posting Request; Future    Large hernia.  I recommend an open repair with mesh. We discussed the relevant anatomy and we discussed the technique of the procedure.  Discussed  risks of bleeding, infection, pain, scarring, injury to structures in the area including nerves, blood vessels, bowel, bladder, risk of chronic pain, hernia recurrence, risk of seroma or hematoma, urinary retention, and risks of general anesthesia including cardiovascular, pulmonary, and thromboembolic complications.  Questions were answered.  Patient wishes to proceed with scheduling.    Mathews Stuhr Raquel James, MD

## 2021-04-18 NOTE — H&P (Signed)
Mathew Hall [id number]   Referring Provider:  Elise Benne*   Subjective   Chief Complaint: Hernia   History of Present Illness: Very pleasant and active 80 year old man who has had a right inguinal hernia for at least 2 years.  Initially it was small, and was noticed by a physician treating him for prostate problems.  Over time however it has enlarged and has extended into his scrotum.  He denies any associated pain or GI symptoms.  Has not tried to reduce it.  He planned to have it fixed last year, but developed COVID-pneumonia and secondarily an empyema requiring thoracotomy by Dr. Roxan Hockey in November.  He reports that he did really well with this and did not have any problems once this was done.  No previous abdominal or hernia surgery.   Review of Systems: A complete review of systems was obtained from the patient.  I have reviewed this information and discussed as appropriate with the patient.  See HPI as well for other ROS.   Medical History: Past Medical History:  Diagnosis Date   Arthritis    GERD (gastroesophageal reflux disease)    History of cancer    Hyperlipidemia     There is no problem list on file for this patient.   History reviewed. No pertinent surgical history.   Allergies  Allergen Reactions   Atorvastatin Other (See Comments)    Join pain Knee pain Joint/knee pain    Other Unknown    Other reaction(s): flushing   Amoxicillin Rash    Current Outpatient Medications on File Prior to Visit  Medication Sig Dispense Refill   aspirin 325 MG tablet Take by mouth     simvastatin (ZOCOR) 40 MG tablet TAKE 1 TABLET (40 MG TOTAL) BY MOUTH DAILY AT 6 PM.     ergocalciferol, vitamin D2, 1,250 mcg (50,000 unit) capsule Take 50,000 Units by mouth every 7 (seven) days     No current facility-administered medications on file prior to visit.    Family History  Problem Relation Age of Onset   Skin cancer Father      Social History    Tobacco Use  Smoking Status Former Smoker   Quit date: 1999   Years since quitting: 23.7  Smokeless Tobacco Never Used     Social History   Socioeconomic History   Marital status: Married  Tobacco Use   Smoking status: Former Smoker    Quit date: 1999    Years since quitting: 23.7   Smokeless tobacco: Never Used  Scientific laboratory technician Use: Never used  Substance and Sexual Activity   Alcohol use: Not Currently   Drug use: Defer   Sexual activity: Defer    Objective:    Vitals:   04/18/21 1027  BP: 112/68  Pulse: 75  Temp: 36.9 C (98.5 F)  SpO2: 97%  Weight: 77.8 kg (171 lb 9.6 oz)  Height: 177.8 cm (5\' 10" )    Body mass index is 24.62 kg/m.  Alert, well-appearing Unlabored respirations Abdomen soft and nontender.  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  No hernia on the left.  Assessment and Plan:  Diagnoses and all orders for this visit:  Non-recurrent unilateral inguinal hernia without obstruction or gangrene -     CCS Case Posting Request; Future    Large hernia.  I recommend an open repair with mesh. We discussed the relevant anatomy and we discussed the technique of the procedure.  Discussed  risks of bleeding, infection, pain, scarring, injury to structures in the area including nerves, blood vessels, bowel, bladder, risk of chronic pain, hernia recurrence, risk of seroma or hematoma, urinary retention, and risks of general anesthesia including cardiovascular, pulmonary, and thromboembolic complications.  Questions were answered.  Patient wishes to proceed with scheduling.    Lizbet Cirrincione Raquel James, MD

## 2021-04-22 NOTE — Progress Notes (Signed)
Surgical Instructions   Your procedure is scheduled on Tuesday 04/30/2021.  Report to [facility] Main Entrance "A" at 05:30 A.M., then check in with the Admitting office.  Call [phone] if you have problems or questions between now and the morning of surgery:   Remember: Do not eat or drink after midnight the night before your surgery    Take these medicines the morning of surgery with A SIP OF WATER:  Amlodipine (Norvasc)  If needed you may take these medications the morning of surgery: Nitroglycerin (Nitrostat) Oxybutynin (Ditropan)   As of today, STOP taking any Aspirin (unless otherwise instructed by your surgeon) or Aspirin-containing products; NSAIDS - Aleve, Naproxen, Ibuprofen, Motrin, Advil, Goody's, BC's, all herbal medications, fish oil, and all vitamins.  Follow your surgeon's instructions on when to stop Aspirin.  If no instructions were given by your surgeon then you will need to call the office to get those instructions.     After your COVID test   You are not required to quarantine however you are required to wear a well-fitting mask when you are out and around people not in your household.  If your mask becomes wet or soiled, replace with a new one.  Wash your hands often with soap and water for 20 seconds or clean your hands with an alcohol-based hand sanitizer that contains at least 60% alcohol.  Do not share personal items.  Notify your provider: if you are in close contact with someone who has COVID  or if you develop a fever of 100.4 or greater, sneezing, cough, sore throat, shortness of breath or body aches.          Do not wear jewelry  Do not wear lotions, powders, colognes, or deodorant.  Do not shave 48 hours prior to surgery.  Men may shave face and neck.  Do not bring valuables to the hospital - [facility] is not responsible for any belongings or valuables.  Do NOT Smoke (Tobacco/Vaping) or drink Alcohol 24 hours prior to your  procedure  If you use a CPAP at night, you may bring all equipment for your overnight stay.   Contacts, glasses, hearing aids, dentures or partials may not be worn into surgery, please bring cases for these belongings   For patients admitted to the hospital, discharge time will be determined by your treatment team.   Patients discharged the day of surgery will not be allowed to drive home, and someone needs to stay with them for 24 hours.  NO VISITORS WILL BE ALLOWED IN PRE-OP WHERE PATIENTS ARE PREPPED FOR SURGERY.  ONLY 1 SUPPORT PERSON MAY BE PRESENT IN THE WAITING ROOM WHILE YOU ARE IN SURGERY.  IF YOU ARE TO BE ADMITTED, ONCE YOU ARE IN YOUR ROOM YOU WILL BE ALLOWED TWO (2) VISITORS. 1 (ONE) VISITOR MAY STAY OVERNIGHT BUT MUST ARRIVE TO THE ROOM BY 8pm.  Minor children may have two parents present. Special consideration for safety and communication needs will be reviewed on a case by case basis.  Special instructions:    Oral Hygiene is also important to reduce your risk of infection.  Remember - BRUSH YOUR TEETH THE MORNING OF SURGERY WITH YOUR REGULAR TOOTHPASTE   [place]- Preparing For Surgery  Before surgery, you can play an important role. Because skin is not sterile, your skin needs to be as free of germs as possible. You can reduce the number of germs on your skin by washing with CHG (chlorahexidine gluconate) Soap before  surgery.  CHG is an antiseptic cleaner which kills germs and bonds with the skin to continue killing germs even after washing.     Please do not use if you have an allergy to CHG or antibacterial soaps. If your skin becomes reddened/irritated stop using the CHG.  Do not shave (including legs and underarms) for at least 48 hours prior to first CHG shower. It is OK to shave your face.  Please follow these instructions carefully.     Shower the NIGHT BEFORE SURGERY and the MORNING OF SURGERY with CHG Soap.   If you chose to wash your hair, wash your hair  first as usual with your normal shampoo. After you shampoo, rinse your hair and body thoroughly to remove the shampoo.    Then [employer] and genitals (private parts) with your normal soap and rinse thoroughly to remove soap.  Next use the CHG Soap as you would any other liquid soap. You can apply CHG directly to the skin and wash gently with a clean washcloth.   Apply the CHG Soap to your body ONLY FROM THE NECK DOWN.  Do not use on open wounds or open sores. Avoid contact with your eyes, ears, mouth and genitals (private parts). Wash Face and genitals (private parts)  with your normal soap.   Wash thoroughly, paying special attention to the area where your surgery will be performed.  Thoroughly rinse your body with warm water from the neck down.  DO NOT shower/wash with your normal soap after using and rinsing off the CHG Soap.  Pat yourself dry with a CLEAN TOWEL.  Wear CLEAN PAJAMAS to bed the night before surgery  Place CLEAN SHEETS on your bed the night before your surgery  DO NOT SLEEP WITH PETS.   Day of Surgery:  Take a shower with CHG soap. Wear Clean/Comfortable clothing the morning of surgery Do not apply any deodorants/lotions.   Remember to brush your teeth WITH YOUR REGULAR TOOTHPASTE.   Please read over the following fact sheets that you were given.

## 2021-04-23 ENCOUNTER — Encounter (HOSPITAL_COMMUNITY)
Admission: RE | Admit: 2021-04-23 | Discharge: 2021-04-23 | Disposition: A | Discharge status: Home / Self Care | Payer: Medicare Other | Source: Ambulatory Visit | Attending: Surgery | Admitting: Surgery

## 2021-04-23 ENCOUNTER — Other Ambulatory Visit: Payer: Self-pay

## 2021-04-23 ENCOUNTER — Encounter (HOSPITAL_COMMUNITY): Payer: Self-pay

## 2021-04-23 DIAGNOSIS — Z01812 Encounter for preprocedural laboratory examination: Secondary | ICD-10-CM | POA: Insufficient documentation

## 2021-04-23 HISTORY — DX: Unspecified osteoarthritis, unspecified site: M19.90

## 2021-04-23 LAB — BASIC METABOLIC PANEL
Anion gap: 8 (ref 5–15)
BUN: 13 mg/dL (ref 8–23)
CO2: 25 mmol/L (ref 22–32)
Calcium: 9.5 mg/dL (ref 8.9–10.3)
Chloride: 105 mmol/L (ref 98–111)
Creatinine, Ser: 1.11 mg/dL (ref 0.61–1.24)
GFR, Estimated: >60 mL/min (ref >60)
Glucose, Bld: 117 mg/dL — ABNORMAL HIGH (ref 70–99)
Potassium: 3.7 mmol/L (ref 3.5–5.1)
Sodium: 138 mmol/L (ref 135–145)

## 2021-04-23 LAB — CBC
HCT: 42.8 % (ref 39.0–52.0)
Hemoglobin: 14 g/dL (ref 13.0–17.0)
MCH: 29.3 pg (ref 26.0–34.0)
MCHC: 32.7 g/dL (ref 30.0–36.0)
MCV: 89.5 fL (ref 80.0–100.0)
Platelets: 231 10*3/uL (ref 150–400)
RBC: 4.78 MIL/uL (ref 4.22–5.81)
RDW: 13 % (ref 11.5–15.5)
WBC: 5.5 10*3/uL (ref 4.0–10.5)
nRBC: 0 % (ref 0.0–0.2)

## 2021-04-23 NOTE — Progress Notes (Signed)
   04/23/21 0905  OBSTRUCTIVE SLEEP APNEA  Have you ever been diagnosed with sleep apnea through a sleep study? No  Do you snore loudly (loud enough to be heard through closed doors)?  1  Do you often feel tired, fatigued, or sleepy during the daytime (such as falling asleep during driving or talking to someone)? 0  Has anyone observed you stop breathing during your sleep? 0  Do you have, or are you being treated for high blood pressure? 1  BMI more than 35 kg/m2? 0  Age > 50 (1-yes) 1  Neck circumference greater than:Male 16 inches or larger, Male 17inches or larger? 1  Male Gender (Yes=1) 1  Obstructive Sleep Apnea Score 5  Score 5 or greater  Results sent to PCP

## 2021-04-23 NOTE — Progress Notes (Signed)
PCP - Dr. [name] Cardiologist - Dr. Quay Burow   Chest x-ray - 07/05/20 EKG - 09/04/20 Stress Test - 11/21/09 ECHO - 10/08/06 Cardiac Cath - 02/18/98  Sleep Study - Denies  DM - Denies  Aspirin Instructions: Requested to call Dr. Kae Heller for instruction  Anesthesia review: Yes, Cardiac history  Patient denies shortness of breath, fever, cough and chest pain at PAT appointment   All instructions explained to the patient, with a verbal understanding of the material. Patient agrees to go over the instructions while at home for a better understanding.  The opportunity to ask questions was provided.

## 2021-04-24 ENCOUNTER — Encounter (HOSPITAL_COMMUNITY): Payer: Self-pay

## 2021-04-24 NOTE — Anesthesia Preprocedure Evaluation (Addendum)
Anesthesia Evaluation  Patient identified by MRN, date of birth, ID band Patient awake    Reviewed: Allergy & Precautions, NPO status , Patient's Chart, lab work & pertinent test results  Airway Mallampati: III  TM Distance: >3 FB Neck ROM: Full    Dental no notable dental hx. 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 on medications + CAD (inferior STEMI, s/p ostial and mid RCA PCI/stenting 8/15/199), + Past MI and + Cardiac Stents  Normal cardiovascular exam Rhythm:Regular Rate:Normal  EKG: 09/04/20 ([facility]): NSR. RBBB. Occasional PVC.   CV: US Carotid 04/11/14:  Summary: Bilateral bulb/proximal ICAs: Demonstrated a mild amount of fibrous plaque with no evidence of hemodynamically significant stenosis. Bilateral vertebral arteries: Normal antegrade flow.   Echo 10/08/06: Summary The left ventricle is normal in size.  Borderline DUST -  no LV outflow obstruction. There is mild to moderate inferior wall hypokinesis. Left ventricular systolic function is low normal.  EF 50-55%.  The transmitral spectral Doppler flow pattern is suggestive of impaired LV relaxation. There is mild mitral annular calcification.  There is mild mitral regurgitation. There is mild tricuspid regurgitation. The aortic valve appears to be mildly sclerotic.  The valve opens well.   Cardiac cath/PCI 02/18/1998 (in setting of inferior STEMI): LV: Overall LVEF estimated at approximately 40-45% with inferior akinesia.  LM: 40% taper stenosis at its ostium. LAD: This vessel was free of significant disease. LCX: Nondominant vessel that was free of significant  disease. Ramus intermedius branch: Large bifurcating vessel that was free of significant disease. RCA: Large dominant vessel with infarct-related artery and totally occluded in its midportion.  There was 70% near ostial stenosis.  There was grade 2 left to right collaterals. - S/p direct PTCA and stenting of occluded RCA using ReoPro    Neuro/Psych negative neurological ROS  negative psych ROS   GI/Hepatic Neg liver ROS, GERD  Medicated and Controlled,  Endo/Other  negative endocrine ROS  Renal/GU negative Renal ROS  negative genitourinary   Musculoskeletal  (+) Arthritis ,   Abdominal   Peds  Hematology negative hematology ROS (+)   Anesthesia Other Findings   Reproductive/Obstetrics                           Anesthesia Physical Anesthesia Plan  ASA: 3  Anesthesia Plan: General   Post-op Pain Management:    Induction: Intravenous  PONV Risk Score and Plan: 2 and Dexamethasone, Ondansetron and Treatment may vary due to age or medical condition  Airway Management Planned: Oral ETT  Additional Equipment:   Intra-op Plan:   Post-operative Plan: Extubation in OR  Informed Consent: I have reviewed the patients History and Physical, chart, labs and discussed the procedure including the risks, benefits and alternatives for the proposed anesthesia with the patient or authorized representative who has indicated his/her understanding and acceptance.     Dental advisory given  Plan Discussed with: CRNA  Anesthesia Plan Comments: ( )      Anesthesia Quick Evaluation

## 2021-04-24 NOTE — Progress Notes (Signed)
Anesthesia Chart Review:  Case: [id number] Date/Time: 04/30/21 0715   Procedure: HERNIA REPAIR RIGHT INGUINAL WITH MESH (Right)   Anesthesia type: General   Pre-op diagnosis: RIGHT INGUINAL HERNIA   Location: [place] OR ROOM 01 / [place] OR   Surgeons: Clovis Riley, MD       DISCUSSION: Patient is a 80 year old male scheduled for the above procedure. He has an enlarging right inguinal hernia which now extends into his scrotum. He was planning surgical intervention last year but was hospitalized for right lung emphysema post-COVID, requiring thoracotomy and drainage in 05/2020. He has since recovered well from this.  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, s/p chemotherapy, RM lobectomy with LN dissection 03/05/09), CAD (inferior STEMI, s/p ostial and mid RCA PCI/stenting 8/15/199), right carotid bruit (2013, no significant ICA disease 04/01/12 & 04/12/15 Duplex), COVID PNA (+ COVID-19 03/22/20, vaccinated; s/p steroid, Remdesivir, monoclonal antibodies; + loculated right pleural effusion s/p thoracentesis 03/23/20 and chest tube 03/25/20-03/29/20, s/p Rocephin/Ancef->Keflex for Streptococcus intermedius; developed right empyema necessitans, s/p right thoracotomy for rib resection, drainage of empyema, modified Eloesser procedure with VAC placement 05/11/20), BPH (required intraoperative cystoscopy, insertion of 18 French council tip catheter over a sensor wire 05/11/20). OSA screening score of 5.   He last saw cardiologist Dr. Gwenlyn Found on 09/04/20. Asymptomatic from CAD. One year follow-up recommended.  He denies shortness of breath, cough, fever, chest pain at PAT RN visit.  Anesthesia team to evaluate on the day of surgery.   VS: BP 138/68   Pulse 70   Temp 37.1 C (Oral)   Resp 18   Ht 5\' 10"  (1.778 m)   Wt 77.5 kg   SpO2 99%   BMI 24.52 kg/m    PROVIDERS: [name], MD is PCP  Quay Burow, MD is  cardiologist. Last visit 09/04/20.  Curt Bears, MD is HEM-ONC. Last visit 10/31/20.  No lung cancer recurrence in 12 years, go recommended patient continue on observation and follow-up with primary care. Modesto Charon, MD is CT surgeon. As needed follow-up at 11/13/20 visit.  Franchot Gallo, MD is urologist Scarlette Shorts, MD is GI   LABS: Labs reviewed: Acceptable for surgery. (all labs ordered are listed, but only abnormal results are displayed)  Labs Reviewed  BASIC METABOLIC PANEL - Abnormal; Notable for the following components:      Result Value   Glucose, Bld 117 (*)    All other components within normal limits  CBC     IMAGES: CT Chest 10/26/20: IMPRESSION: 1. No findings to suggest recurrent disease or metastatic disease in the thorax. 2. Tiny stable right upper lobe pulmonary nodules, likely benign. 3. Resolved right-sided empyema and bronchopleural fistula. 4. Aortic atherosclerosis, in addition to left main and 3 vessel coronary artery disease. Assessment for potential risk factor modification, dietary therapy or pharmacologic therapy may be warranted, if clinically indicated. 5. There are calcifications of the aortic valve and mitral annulus. Echocardiographic correlation for evaluation of potential valvular dysfunction may be warranted if clinically indicated. 6. Cholelithiasis.   EKG: 09/04/20 ([facility]): NSR. RBBB. Occasional PVC.   CV: US Carotid 04/11/14:  Summary: Bilateral bulb/proximal ICAs: Demonstrated a mild amount of fibrous plaque with no evidence of hemodynamically significant stenosis. Bilateral vertebral arteries: Normal antegrade flow.   Echo 10/08/06: Summary The left ventricle is normal in size.  Borderline DUST -  no LV outflow obstruction. There is mild to moderate inferior wall hypokinesis. Left ventricular  systolic function is low normal.  EF 50-55%.  The transmitral spectral Doppler flow pattern is suggestive of impaired  LV relaxation. There is mild mitral annular calcification.  There is mild mitral regurgitation. There is mild tricuspid regurgitation. The aortic valve appears to be mildly sclerotic.  The valve opens well.   Cardiac cath/PCI 02/18/1998 (in setting of inferior STEMI): LV: Overall LVEF estimated at approximately 40-45% with inferior akinesia.  LM: 40% taper stenosis at its ostium. LAD: This vessel was free of significant disease. LCX: Nondominant vessel that was free of significant disease. Ramus intermedius branch: Large bifurcating vessel that was free of significant disease. RCA: Large dominant vessel with infarct-related artery and totally occluded in its midportion.  There was 70% near ostial stenosis.  There was grade 2 left to right collaterals. - S/p direct PTCA and stenting of occluded RCA using ReoPro    Past Medical History:  Diagnosis Date   Arthritis    Asthma    BPH (benign prostatic hyperplasia) 05/11/2020   CAD (coronary artery disease) 1999   stents to RCA, residual 40% Lt main, decreased EF40%    Cataract    bilateral-beginning stages   Colon polyps    GERD (gastroesophageal reflux disease)    H/O cardiovascular stress test 1999/2005/2008/2011   last with inf scar/infarct, EF 54%   H/O echocardiogram 2008   EF 50-55%, Mild MR, Mild TR,     Hemorrhoids    Hx of acute myocardial infarction of inferior wall 02/18/1998   Hyperlipemia    Hypertension    lung ca dx'd 10/2008   chemo comp 01/2009-stage 4 lung cancer   Myocardial infarction ([place]) 02/18/1998   Peptic stricture of esophagus    Pneumonia 05/2020   Right carotid bruit 04/01/2012   minimal carotid stenosis on doppler    Past Surgical History:  Procedure Laterality Date   ANGIOPLASTY     with 2 stent placement   APPLICATION OF WOUND VAC Right 05/11/2020   Procedure: APPLICATION OF WOUND VAC;  Surgeon: [name], MD;  Location: [place];  Service: Thoracic;  Laterality: Right;   APPLICATION OF  WOUND VAC Right 05/14/2020   Procedure: WOUND VAC CHANGE;  Surgeon: [name], MD;  Location: [place];  Service: Thoracic;  Laterality: Right;   APPLICATION OF WOUND VAC Right 05/17/2020   Procedure: WOUND VAC CHANGE;  Surgeon: [name], MD;  Location: [place];  Service: Thoracic;  Laterality: Right;   APPLICATION OF WOUND VAC Right 05/21/2020   Procedure: WOUND VAC CHANGE RIGHT CHEST;  Surgeon: [name], MD;  Location: [place];  Service: Vascular;  Laterality: Right;   COLONOSCOPY     CORONARY ANGIOPLASTY WITH STENT PLACEMENT  02/18/1998   PCI/stents to ostial and mid RCA, emergently, residual Lt main disease   EMPYEMA DRAINAGE Right 05/11/2020   Procedure: EMPYEMA DRAINAGE;  Surgeon: [name], MD;  Location: [place];  Service: Thoracic;  Laterality: Right;   LUNG SURGERY     right   POLYPECTOMY     HPP 09-21-08   RIB RESECTION Right 05/11/2020   Procedure: RIB RESECTION;  Surgeon: [name], MD;  Location: [place];  Service: Thoracic;  Laterality: Right;   THORACOTOMY Right 05/11/2020   Procedure: THORACOTOMY;  Surgeon: [name], MD;  Location: [facility];  Service: Thoracic;  Laterality: Right;    MEDICATIONS:  amLODipine (NORVASC) 10 MG tablet   aspirin 325 MG EC tablet   ergocalciferol (VITAMIN D2)  1.25 MG (50000 UT) capsule   Esomeprazole Magnesium 20 MG TBEC   Multiple Vitamin (MULTIVITAMIN WITH MINERALS) TABS tablet   nitroGLYCERIN (NITROSTAT) 0.4 MG SL tablet   oxybutynin (DITROPAN) 5 MG tablet   phenylephrine (SUDAFED PE) 10 MG TABS tablet   simvastatin (ZOCOR) 40 MG tablet    0.9 %  sodium chloride infusion  Patient instructed to clarify perioperative ASA instructions with surgeon.   Myra Gianotti, PA-C Surgical Short Stay/Anesthesiology [facility] Phone [phone] [facility] Phone [phone] 04/24/2021 3:16 PM

## 2021-04-30 ENCOUNTER — Encounter (HOSPITAL_COMMUNITY): Payer: Self-pay | Admitting: Surgery

## 2021-04-30 ENCOUNTER — Ambulatory Visit (HOSPITAL_COMMUNITY): Payer: Medicare Other | Admitting: Vascular Surgery

## 2021-04-30 ENCOUNTER — Ambulatory Visit (HOSPITAL_COMMUNITY)
Admission: RE | Admit: 2021-04-30 | Discharge: 2021-04-30 | Disposition: A | Discharge status: Home / Self Care | Payer: Medicare Other | Attending: Surgery | Admitting: Surgery

## 2021-04-30 ENCOUNTER — Ambulatory Visit (HOSPITAL_COMMUNITY): Payer: Medicare Other | Admitting: Anesthesiology

## 2021-04-30 ENCOUNTER — Other Ambulatory Visit: Payer: Self-pay

## 2021-04-30 ENCOUNTER — Encounter (HOSPITAL_COMMUNITY)
Admission: RE | Disposition: A | Discharge status: Home / Self Care | Payer: Self-pay | Source: Home / Self Care | Attending: Surgery

## 2021-04-30 DIAGNOSIS — K4030 Unilateral inguinal hernia, with obstruction, without gangrene, not specified as recurrent: Secondary | ICD-10-CM | POA: Diagnosis not present

## 2021-04-30 DIAGNOSIS — Z9221 Personal history of antineoplastic chemotherapy: Secondary | ICD-10-CM | POA: Diagnosis not present

## 2021-04-30 DIAGNOSIS — Z8616 Personal history of COVID-19: Secondary | ICD-10-CM | POA: Diagnosis not present

## 2021-04-30 DIAGNOSIS — Z880 Allergy status to penicillin: Secondary | ICD-10-CM | POA: Diagnosis not present

## 2021-04-30 DIAGNOSIS — Z7982 Long term (current) use of aspirin: Secondary | ICD-10-CM | POA: Diagnosis not present

## 2021-04-30 DIAGNOSIS — Z79899 Other long term (current) drug therapy: Secondary | ICD-10-CM | POA: Insufficient documentation

## 2021-04-30 DIAGNOSIS — D176 Benign lipomatous neoplasm of spermatic cord: Secondary | ICD-10-CM | POA: Insufficient documentation

## 2021-04-30 DIAGNOSIS — Z888 Allergy status to other drugs, medicaments and biological substances status: Secondary | ICD-10-CM | POA: Insufficient documentation

## 2021-04-30 DIAGNOSIS — Z902 Acquired absence of lung [part of]: Secondary | ICD-10-CM | POA: Diagnosis not present

## 2021-04-30 DIAGNOSIS — Z85118 Personal history of other malignant neoplasm of bronchus and lung: Secondary | ICD-10-CM | POA: Diagnosis not present

## 2021-04-30 DIAGNOSIS — Z87891 Personal history of nicotine dependence: Secondary | ICD-10-CM | POA: Insufficient documentation

## 2021-04-30 HISTORY — PX: INGUINAL HERNIA REPAIR: SHX194

## 2021-04-30 SURGERY — REPAIR, HERNIA, INGUINAL, ADULT
Anesthesia: General | Site: Inguinal | Laterality: Right

## 2021-04-30 MED ORDER — 0.9 % SODIUM CHLORIDE (POUR BTL) OPTIME
TOPICAL | Status: DC | PRN
  Administered 2021-04-30: 1000 mL

## 2021-04-30 MED ORDER — SODIUM CHLORIDE 0.9 % IV SOLN: 250.0000 mL | INTRAVENOUS | Status: DC | PRN

## 2021-04-30 MED ORDER — BUPIVACAINE-EPINEPHRINE 0.25% -1:200000 IJ SOLN
INTRAMUSCULAR | Status: DC | PRN
  Administered 2021-04-30: 20 mL

## 2021-04-30 MED ORDER — PHENYLEPHRINE HCL-NACL 20-0.9 MG/250ML-% IV SOLN
INTRAVENOUS | Status: DC | PRN
  Administered 2021-04-30: 25 ug/min via INTRAVENOUS

## 2021-04-30 MED ORDER — BUPIVACAINE LIPOSOME 1.3 % IJ SUSP
20.0000 mL | Freq: Once | INTRAMUSCULAR | Status: DC
  Filled 2021-04-30: qty 20

## 2021-04-30 MED ORDER — ACETAMINOPHEN 500 MG PO TABS
1000.0000 mg | ORAL_TABLET | Freq: Once | ORAL | Status: AC
  Administered 2021-04-30: 1000 mg via ORAL
  Filled 2021-04-30: qty 2

## 2021-04-30 MED ORDER — ONDANSETRON HCL 4 MG/2ML IJ SOLN
INTRAMUSCULAR | Status: AC
  Filled 2021-04-30: qty 2

## 2021-04-30 MED ORDER — ONDANSETRON HCL 4 MG/2ML IJ SOLN
INTRAMUSCULAR | Status: DC | PRN
  Administered 2021-04-30: 4 mg via INTRAVENOUS

## 2021-04-30 MED ORDER — DEXAMETHASONE SODIUM PHOSPHATE 10 MG/ML IJ SOLN
INTRAMUSCULAR | Status: AC
  Filled 2021-04-30: qty 1

## 2021-04-30 MED ORDER — SODIUM CHLORIDE 0.9% FLUSH: 3.0000 mL | Freq: Two times a day (BID) | INTRAVENOUS | Status: DC

## 2021-04-30 MED ORDER — PROPOFOL 10 MG/ML IV BOLUS
INTRAVENOUS | Status: AC
  Filled 2021-04-30: qty 20

## 2021-04-30 MED ORDER — ACETAMINOPHEN 500 MG PO TABS: 1000.0000 mg | ORAL_TABLET | ORAL | Status: DC

## 2021-04-30 MED ORDER — BUPIVACAINE-EPINEPHRINE (PF) 0.25% -1:200000 IJ SOLN
INTRAMUSCULAR | Status: AC
  Filled 2021-04-30: qty 30

## 2021-04-30 MED ORDER — LIDOCAINE 2% (20 MG/ML) 5 ML SYRINGE
INTRAMUSCULAR | Status: AC
  Filled 2021-04-30: qty 5

## 2021-04-30 MED ORDER — LIDOCAINE 2% (20 MG/ML) 5 ML SYRINGE
INTRAMUSCULAR | Status: DC | PRN
  Administered 2021-04-30: 60 mg via INTRAVENOUS

## 2021-04-30 MED ORDER — MIDAZOLAM HCL 2 MG/2ML IJ SOLN
INTRAMUSCULAR | Status: AC
  Filled 2021-04-30: qty 2

## 2021-04-30 MED ORDER — CHLORHEXIDINE GLUCONATE 4 % EX LIQD: 60.0000 mL | Freq: Once | CUTANEOUS | Status: DC

## 2021-04-30 MED ORDER — ORAL CARE MOUTH RINSE: 15.0000 mL | Freq: Once | OROMUCOSAL | Status: AC

## 2021-04-30 MED ORDER — PROPOFOL 10 MG/ML IV BOLUS
INTRAVENOUS | Status: DC | PRN
  Administered 2021-04-30: 90 mg via INTRAVENOUS

## 2021-04-30 MED ORDER — BUPIVACAINE LIPOSOME 1.3 % IJ SUSP
INTRAMUSCULAR | Status: DC | PRN
  Administered 2021-04-30: 20 mL

## 2021-04-30 MED ORDER — FENTANYL CITRATE (PF) 100 MCG/2ML IJ SOLN: 25.0000 ug | INTRAMUSCULAR | Status: DC | PRN

## 2021-04-30 MED ORDER — ROCURONIUM BROMIDE 100 MG/10ML IV SOLN
INTRAVENOUS | Status: DC | PRN
  Administered 2021-04-30: 70 mg via INTRAVENOUS
  Administered 2021-04-30: 10 mg via INTRAVENOUS

## 2021-04-30 MED ORDER — FENTANYL CITRATE (PF) 250 MCG/5ML IJ SOLN
INTRAMUSCULAR | Status: AC
  Filled 2021-04-30: qty 5

## 2021-04-30 MED ORDER — BUPIVACAINE LIPOSOME 1.3 % IJ SUSP
INTRAMUSCULAR | Status: AC
  Filled 2021-04-30: qty 20

## 2021-04-30 MED ORDER — OXYCODONE HCL 5 MG PO TABS: 5.0000 mg | ORAL_TABLET | Freq: Three times a day (TID) | ORAL | 0 refills | Status: AC | PRN

## 2021-04-30 MED ORDER — DOCUSATE SODIUM 100 MG PO CAPS: 100.0000 mg | ORAL_CAPSULE | Freq: Two times a day (BID) | ORAL | 0 refills | Status: AC

## 2021-04-30 MED ORDER — FENTANYL CITRATE (PF) 100 MCG/2ML IJ SOLN
INTRAMUSCULAR | Status: DC | PRN
  Administered 2021-04-30: 100 ug via INTRAVENOUS

## 2021-04-30 MED ORDER — ACETAMINOPHEN 650 MG RE SUPP: 650.0000 mg | RECTAL | Status: DC | PRN

## 2021-04-30 MED ORDER — CEFAZOLIN SODIUM-DEXTROSE 2-4 GM/100ML-% IV SOLN
2.0000 g | INTRAVENOUS | Status: AC
  Administered 2021-04-30: 2 g via INTRAVENOUS
  Filled 2021-04-30: qty 100

## 2021-04-30 MED ORDER — LACTATED RINGERS IV SOLN: INTRAVENOUS | Status: DC

## 2021-04-30 MED ORDER — PHENYLEPHRINE 40 MCG/ML (10ML) SYRINGE FOR IV PUSH (FOR BLOOD PRESSURE SUPPORT)
PREFILLED_SYRINGE | INTRAVENOUS | Status: DC | PRN
  Administered 2021-04-30: 120 ug via INTRAVENOUS

## 2021-04-30 MED ORDER — ACETAMINOPHEN 325 MG PO TABS: 650.0000 mg | ORAL_TABLET | ORAL | Status: DC | PRN

## 2021-04-30 MED ORDER — OXYCODONE HCL 5 MG PO TABS: 5.0000 mg | ORAL_TABLET | ORAL | Status: DC | PRN

## 2021-04-30 MED ORDER — DEXAMETHASONE SODIUM PHOSPHATE 10 MG/ML IJ SOLN
INTRAMUSCULAR | Status: DC | PRN
  Administered 2021-04-30: 8 mg via INTRAVENOUS

## 2021-04-30 MED ORDER — SUGAMMADEX SODIUM 200 MG/2ML IV SOLN
INTRAVENOUS | Status: DC | PRN
  Administered 2021-04-30: 200 mg via INTRAVENOUS

## 2021-04-30 MED ORDER — CHLORHEXIDINE GLUCONATE 0.12 % MT SOLN
15.0000 mL | Freq: Once | OROMUCOSAL | Status: AC
  Administered 2021-04-30: 15 mL via OROMUCOSAL
  Filled 2021-04-30: qty 15

## 2021-04-30 MED ORDER — SODIUM CHLORIDE 0.9% FLUSH: 3.0000 mL | INTRAVENOUS | Status: DC | PRN

## 2021-04-30 SURGICAL SUPPLY — 42 items
BAG COUNTER SPONGE SURGICOUNT (BAG) ×2 IMPLANT
BENZOIN TINCTURE PRP APPL 2/3 (GAUZE/BANDAGES/DRESSINGS) ×2 IMPLANT
BLADE CLIPPER SURG (BLADE) IMPLANT
CANISTER SUCT 3000ML PPV (MISCELLANEOUS) IMPLANT
CHLORAPREP W/TINT 26 (MISCELLANEOUS) ×2 IMPLANT
COVER SURGICAL LIGHT HANDLE (MISCELLANEOUS) ×2 IMPLANT
DRAIN PENROSE 1/2X12 LTX STRL (WOUND CARE) IMPLANT
DRAPE LAPAROTOMY TRNSV 102X78 (DRAPES) IMPLANT
DRSG TEGADERM 4X4.75 (GAUZE/BANDAGES/DRESSINGS) ×2 IMPLANT
ELECT REM PT RETURN 9FT ADLT (ELECTROSURGICAL) ×2
ELECTRODE REM PT RTRN 9FT ADLT (ELECTROSURGICAL) ×1 IMPLANT
GAUZE 4X4 16PLY ~~LOC~~+RFID DBL (SPONGE) ×2 IMPLANT
GAUZE SPONGE 4X4 12PLY STRL (GAUZE/BANDAGES/DRESSINGS) ×2 IMPLANT
GLOVE SURG ENC MOIS LTX SZ6 (GLOVE) ×2 IMPLANT
GLOVE SURG UNDER LTX SZ6.5 (GLOVE) ×2 IMPLANT
GOWN STRL REUS W/ TWL LRG LVL3 (GOWN DISPOSABLE) ×2 IMPLANT
GOWN STRL REUS W/TWL LRG LVL3 (GOWN DISPOSABLE) ×4
KIT BASIN OR (CUSTOM PROCEDURE TRAY) ×2 IMPLANT
KIT TURNOVER KIT B (KITS) ×2 IMPLANT
MESH BARD SOFT 3X6IN (Mesh General) ×2 IMPLANT
NEEDLE HYPO 25GX1X1/2 BEV (NEEDLE) ×2 IMPLANT
NS IRRIG 1000ML POUR BTL (IV SOLUTION) ×2 IMPLANT
PACK GENERAL/GYN (CUSTOM PROCEDURE TRAY) ×2 IMPLANT
PAD ARMBOARD 7.5X6 YLW CONV (MISCELLANEOUS) ×2 IMPLANT
PENCIL SMOKE EVACUATOR (MISCELLANEOUS) ×2 IMPLANT
STRIP CLOSURE SKIN 1/2X4 (GAUZE/BANDAGES/DRESSINGS) ×2 IMPLANT
SUT ETHIBOND 0 MO6 C/R (SUTURE) ×2 IMPLANT
SUT MNCRL AB 4-0 PS2 18 (SUTURE) ×2 IMPLANT
SUT PDS AB 2-0 CT1 27 (SUTURE) ×2 IMPLANT
SUT SILK 3 0 (SUTURE)
SUT SILK 3-0 18XBRD TIE 12 (SUTURE) IMPLANT
SUT VIC AB 0 CT2 27 (SUTURE) ×2 IMPLANT
SUT VIC AB 2-0 SH 27 (SUTURE) ×2
SUT VIC AB 2-0 SH 27X BRD (SUTURE) ×1 IMPLANT
SUT VIC AB 3-0 SH 27 (SUTURE) ×2
SUT VIC AB 3-0 SH 27XBRD (SUTURE) ×1 IMPLANT
SUT VICRYL AB 3 0 TIES (SUTURE) ×2 IMPLANT
SYR CONTROL 10ML LL (SYRINGE) ×2 IMPLANT
TOWEL GREEN STERILE (TOWEL DISPOSABLE) ×2 IMPLANT
TOWEL GREEN STERILE FF (TOWEL DISPOSABLE) ×2 IMPLANT
TRAY FOLEY W/BAG SLVR 16FR (SET/KITS/TRAYS/PACK)
TRAY FOLEY W/BAG SLVR 16FR ST (SET/KITS/TRAYS/PACK) IMPLANT

## 2021-04-30 NOTE — Transfer of Care (Signed)
Immediate Anesthesia Transfer of Care Note  Patient: Mathew Hall  Procedure(s) Performed: HERNIA REPAIR RIGHT INGUINAL WITH MESH (Right: Inguinal)  Patient Location: PACU  Anesthesia Type:General  Level of Consciousness: sedated  Airway & Oxygen Therapy: Patient Spontanous Breathing and Patient connected to nasal cannula oxygen  Post-op Assessment: Report given to RN and Post -op Vital signs reviewed and stable  Post vital signs: Reviewed and stable  Last Vitals:  Vitals Value Taken Time  BP 122/54 04/30/21 0926  Temp 36.6 C 04/30/21 0926  Pulse 68 04/30/21 0931  Resp 18 04/30/21 0931  SpO2 99 % 04/30/21 0931  Vitals shown include unvalidated device data.  Last Pain:  Vitals:   04/30/21 0926  TempSrc:   PainSc: Asleep         Complications: No notable events documented.

## 2021-04-30 NOTE — Anesthesia Procedure Notes (Signed)
Procedure Name: Intubation Date/Time: 04/30/2021 7:46 AM Performed by: Leonor Liv, CRNA Pre-anesthesia Checklist: Patient identified, Emergency Drugs available, Suction available and Patient being monitored Patient Re-evaluated:Patient Re-evaluated prior to induction Oxygen Delivery Method: Circle System Utilized Preoxygenation: Pre-oxygenation with 100% oxygen Induction Type: IV induction Ventilation: Mask ventilation without difficulty Laryngoscope Size: Mac and 4 Grade View: Grade I Tube type: Oral Tube size: 7.5 mm Number of attempts: 1 Airway Equipment and Method: Stylet and Oral airway Placement Confirmation: ETT inserted through vocal cords under direct vision, positive ETCO2 and breath sounds checked- equal and bilateral Secured at: 23 cm Tube secured with: Tape Dental Injury: Teeth and Oropharynx as per pre-operative assessment

## 2021-04-30 NOTE — Op Note (Signed)
Operative Note  Mathew Hall  [id number]  [id number]  04/30/2021   Surgeon: Romana Juniper MD FACS   Assistant: Vernell Leep MD ([id number]) I was personally present during and performed the key and critical portions of this procedure and immediately available throughout the entire procedure, as documented in my operative note.    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 The groin was clipped, prepped and draped in the usual sterile fashion. An oblique incision was made the just above the inguinal ligament after infiltrating the tissues with local anesthetic (Exparel mixed with quarter percent Marcaine with epinephrine). Soft tissues were dissected using electrocautery until the external oblique aponeurosis was encountered. This was divided sharply to expand the external ring. A plane was bluntly developed between the spermatic cord and the external oblique. The ilioinguinal nerve was identified, divided between hemostats and each and ligated with 3-0 Vicryl ties. The spermatic cord was then bluntly dissected away from the pubic tubercle and encircled with a Penrose. Inspection of the inguinal anatomy revealed a massive indirect hernia sac containing a large amount of omentum. The indirect hernia sac was bluntly dissected away from the cord structures. Once we had affirmatively identified the sac, it was carefully  opened in a region that was very thin. Inspection confirmed communication with the peritoneal cavity with no bowel contained currently.  The omentum was easily reduced and the sac was clamped and the excess excised at the level of the internal ring, where the remaining peritoneum was suture ligated with a 0 vicryl and reduced into the abdomen.  The internal ring was narrowed in the inguinal floor reconstructed with interrupted 2-0 PDS.  A Bard soft mesh was brought onto the field and trimmed to approximate the field. This was sutured to the pubic tubercle fascia using 0 ethibond. Interrupted 0 ethibonds were then used to suture the mesh to the inferior shelving edge and to the internal oblique superiorly. The tails of the mesh were wrapped around the spermatic cord, ensuring adequate room for the cord, and suture to each other with 0 ethibond, and then directed laterally to lie flat beneath the external oblique aponeurosis.  An additional Ethibond was placed superiorly to secure the mesh to the repaired inguinal floor.  Hemostasis was ensured within the wound. The Penrose was removed. The external oblique aponeurosis was reapproximated with a running 3-0 Vicryl to re-create a narrowed external ring. More local was infiltrated around the pubic tubercle and in the plane just below the external oblique. The Scarpa's was reapproximated with interrupted 3-0 Vicryls. The skin was closed with a running subcuticular 4-0 Monocryl. The remainder of the local was injected in the subcutaneous and subcuticular space. The field was then cleaned, benzoin and Steri-Strips and sterile bandage were applied. Both testicles were palpated in the scrotum at the end of the case. The patient was then awakened extubated and taken to PACU in stable condition.    All counts were correct at the completion of the case

## 2021-04-30 NOTE — Discharge Instructions (Signed)
HERNIA REPAIR: POST OP INSTRUCTIONS   EAT Gradually transition to a high fiber diet with a fiber supplement over the next few weeks after discharge.  Start with a pureed / full liquid diet (see below)  WALK Walk an hour a day (cumulative- not all at once).  Control your pain to do that.    CONTROL PAIN Control pain so that you can walk, sleep, tolerate sneezing/coughing, and go up/down stairs.  HAVE A BOWEL MOVEMENT DAILY Keep your bowels regular to avoid problems.  OK to try a laxative to override constipation.  OK to use an antidairrheal to slow down diarrhea.  Call if not better after 2 tries  CALL IF YOU HAVE PROBLEMS/CONCERNS Call if you are still struggling despite following these instructions. Call if you have concerns not answered by these instructions  ######################################################################    DIET: Follow a light bland diet & liquids the first 24 hours after arrival home, such as soup, liquids, starches, etc.  Be sure to drink plenty of fluids.  Quickly advance to a usual solid diet within a few days.  Avoid fast food or heavy meals as your are more likely to get nauseated or have irregular bowels.  A low-sugar, high-fiber diet for the rest of your life is ideal.   Take your usually prescribed home medications unless otherwise directed.  PAIN CONTROL: Pain is best controlled by a usual combination of three different methods TOGETHER: Ice/Heat Over the counter pain medication Prescription pain medication Most patients will experience some swelling and bruising around the hernia(s) such as the bellybutton, groins, or old incisions.  Ice packs or heating pads (30-60 minutes up to 6 times a day) will help. Use ice for the first few days to help decrease swelling and bruising, then switch to heat to help relax tight/sore spots and speed recovery.  Some people prefer to use ice alone, heat alone, alternating between ice & heat.  Experiment to what  works for you.  Swelling and bruising can take several weeks to resolve.   It is helpful to take an over-the-counter pain medication regularly for the first days: Naproxen (Aleve, etc)  Two 220mg  tabs twice a day OR Ibuprofen (Advil, etc) Three 200mg  tabs four times a day (every meal & bedtime) AND Acetaminophen (Tylenol, etc) 325-650mg  four times a day (every meal & bedtime) A  prescription for pain medication should be given to you upon discharge.  Take your pain medication as prescribed, IF NEEDED.  If you are having problems/concerns with the prescription medicine (does not control pain, nausea, vomiting, rash, itching, etc), please call us [phone] to see if we need to switch you to a different pain medicine that will work better for you and/or control your side effect better. If you need a refill on your pain medication, please contact your pharmacy.  They will contact our office to request authorization. Prescriptions will not be filled after 5 pm or on week-ends.  Avoid getting constipated.  Between the surgery and the pain medications, it is common to experience some constipation.  Increasing fluid intake and taking a fiber supplement (such as Metamucil, Citrucel, FiberCon, MiraLax, etc) 1-2 times a day regularly will usually help prevent this problem from occurring.  A mild laxative (prune juice, Milk of Magnesia, MiraLax, etc) should be taken according to package directions if there are no bowel movements after 48 hours.    Wash / shower every day, starting 2 days after surgery.  You may shower over the  steri strips which are waterproof.    Remove your outer bandage 2 days after surgery. Steri strips will peel off after 1-2 weeks. You may leave the incision open to air.  You may replace a dressing/Band-Aid to cover an incision for comfort if you wish.  Continue to shower over incision(s) after the dressing is off.  ACTIVITIES as tolerated:   You may resume regular (light) daily  activities beginning the next day--such as daily self-care, walking, climbing stairs--gradually increasing activities as tolerated.  Control your pain so that you can walk an hour a day.  If you can walk 30 minutes without difficulty, it is safe to try more intense activity such as jogging, treadmill, bicycling, low-impact aerobics, swimming, etc. Refrain from the most intensive and strenuous activity such as sit-ups, heavy lifting (more than 15lb), contact sports, etc  Refrain from any heavy lifting or straining until 6 weeks after surgery.   DO NOT PUSH THROUGH PAIN.  Let pain be your guide: If it hurts to do something, don't do it.  Pain is your body warning you to avoid that activity for another week until the pain goes down. You may drive when you are no longer taking prescription pain medication, you can comfortably wear a seatbelt, and you can safely maneuver your car and apply brakes. You may have sexual intercourse when it is comfortable.   FOLLOW UP in our office Please call [facility] at [phone] to set up an appointment to see your surgeon in the office for a follow-up appointment approximately 2-3 weeks after your surgery. Make sure that you call for this appointment the day you arrive home to insure a convenient appointment time.  9.  If you have disability of FMLA / Family leave forms, please bring the forms to the office for processing.  (do not give to your surgeon).  WHEN TO CALL us [phone]: Poor pain control Reactions / problems with new medications (rash/itching, nausea, etc)  Fever over 101.5 F (38.5 C) Inability to urinate Nausea and/or vomiting Worsening swelling or bruising Continued bleeding from incision. Increased pain, redness, or drainage from the incision   The clinic staff is available to answer your questions during regular business hours (8:30am-5pm).  Please don't hesitate to call and ask to speak to one of our nurses for clinical concerns.   If you have  a medical emergency, go to the nearest emergency room or call 911.  A surgeon from [facility] is always on call at the hospitals in [facility], [place], [place], [place], [place]  [address] ?  [address] [address], [place], [place]   [address] MAIN: [phone] ? TOLL FREE: [phone] ? FAX: [phone] [email or web address]

## 2021-04-30 NOTE — Interval H&P Note (Signed)
History and Physical Interval Note:  04/30/2021 6:54 AM  Mathew Hall  has presented today for surgery, with the diagnosis of RIGHT INGUINAL HERNIA.  The various methods of treatment have been discussed with the patient and family. After consideration of risks, benefits and other options for treatment, the patient has consented to  Procedure(s): HERNIA REPAIR RIGHT INGUINAL WITH MESH (Right) as a surgical intervention.  The patient's history has been reviewed, patient examined, no change in status, stable for surgery.  I have reviewed the patient's chart and labs.  Questions were answered to the patient's satisfaction.     [name] Rich Brave

## 2021-04-30 NOTE — Anesthesia Postprocedure Evaluation (Signed)
Anesthesia Post Note  Patient: Mathew Hall  Procedure(s) Performed: HERNIA REPAIR RIGHT INGUINAL WITH MESH (Right: Inguinal)     Patient location during evaluation: PACU Anesthesia Type: General Level of consciousness: awake and alert Pain management: pain level controlled Vital Signs Assessment: post-procedure vital signs reviewed and stable Respiratory status: spontaneous breathing, nonlabored ventilation, respiratory function stable and patient connected to nasal cannula oxygen Cardiovascular status: blood pressure returned to baseline and stable Postop Assessment: no apparent nausea or vomiting Anesthetic complications: no   No notable events documented.  Last Vitals:  Vitals:   04/30/21 1027 04/30/21 1042  BP: (!) 116/54 121/66  Pulse: (!) 58 (!) 55  Resp: (!) 22 17  Temp:  36.7 C  SpO2: 98% 93%    Last Pain:  Vitals:   04/30/21 1042  TempSrc:   PainSc: 0-No pain                 Canyon Willow L Janey Petron

## 2021-05-01 ENCOUNTER — Encounter (HOSPITAL_COMMUNITY): Payer: Self-pay | Admitting: Surgery

## 2022-03-06 ENCOUNTER — Other Ambulatory Visit: Payer: Self-pay | Admitting: Cardiovascular Disease

## 2022-04-19 IMAGING — CR DG CHEST 2V
2 series · 2 of 2 positions shown · non-contrast
Comparison: Chest CT 07/29/2019 and earlier.

CLINICAL DATA: 79-year-old male with cough and back pain. Former
smoker. Previous right middle lobectomy.

EXAM:
CHEST - 2 VIEW

[chest pa]
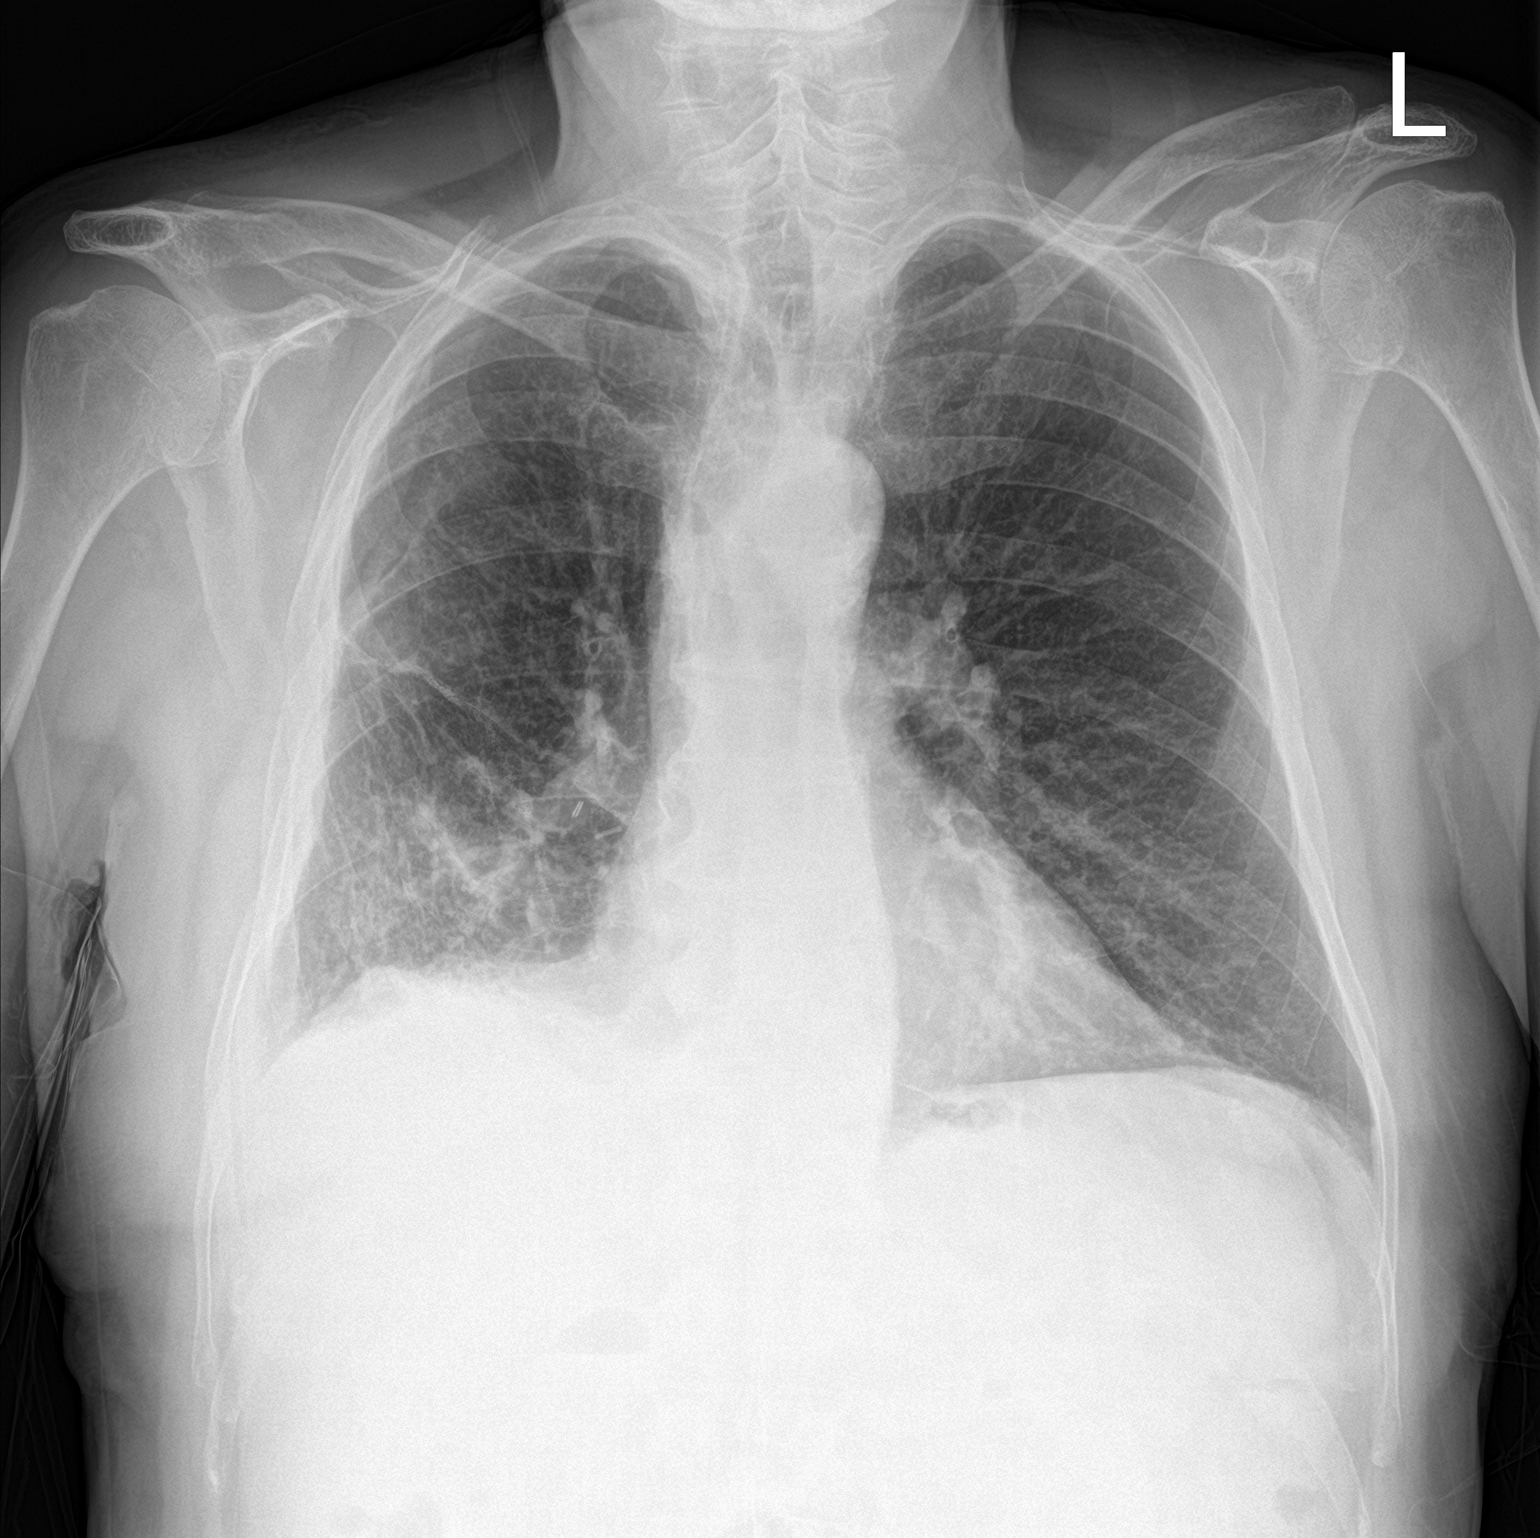

[chest lat]
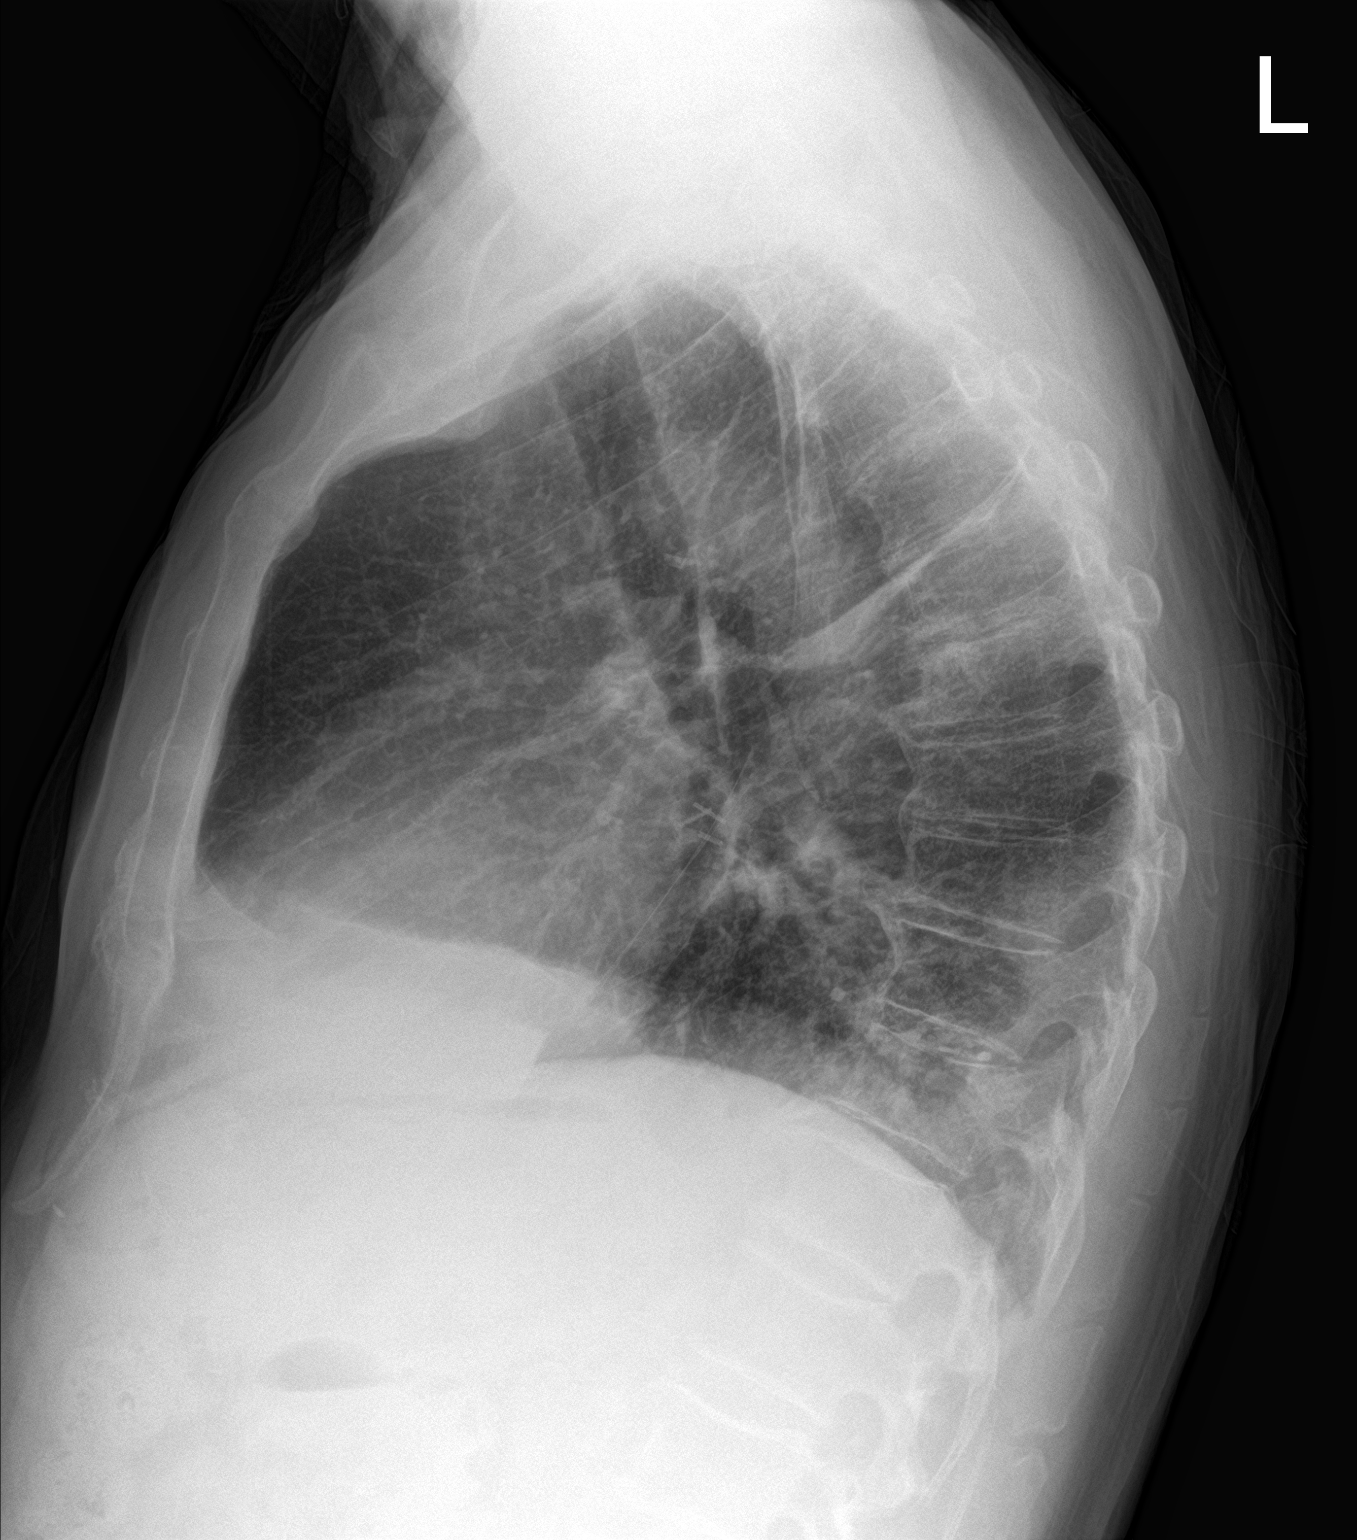

[2 of 2 positions shown; findings below may reference images not displayed]

FINDINGS: Right lung volume loss and architectural distortion similar to the
Suyang CT. However, with increased streaky and indistinct right
lung base opacity since that time. Left lung volume is stable. No
superimposed pneumothorax. No definite pleural effusion. No
pulmonary edema. Mediastinal contours are stable and within normal
limits. Visualized tracheal air column is within normal limits.

No acute osseous abnormality identified. Negative visible bowel gas
pattern.
IMPRESSION: Sequelae of prior right middle lobectomy, but with superimposed new
interstitial opacity in the right lower lung. Favor acute
viral/atypical respiratory infection. No definite pleural effusion.

Recommend Followup PA and lateral chest X-ray, or alternatively
repeat restaging Chest CT, following therapy to ensure resolution
and exclude recurrent malignancy.

## 2022-04-20 IMAGING — CT CT ANGIO CHEST
2 of 6 series · 17 of 46 positions shown · IV contrast (omnipaque)
Comparison: CT chest 07/29/2019

CLINICAL DATA: Abdominal pain, pleuritic RIGHT chest pain, history
lung cancer post resection, possible RIGHT groin hernia, prior
abnormal CT chest

EXAM:
CT ANGIOGRAPHY CHEST
CT ABDOMEN AND PELVIS WITH CONTRAST
TECHNIQUE: Multidetector CT imaging of the chest was performed using the
standard protocol during bolus administration of intravenous
contrast. Multiplanar CT image reconstructions and MIPs were
obtained to evaluate the vascular anatomy. Multidetector CT imaging
of the abdomen and pelvis was performed using the standard protocol
during bolus administration of intravenous contrast.
CONTRAST:  100mL OMNIPAQUE IOHEXOL 350 MG/ML SOLN IV. No oral
contrast.

[Series 4: thins · axial · 0.75mm/px · z∈[+1089,+1331]mm · 15 of 266 slices shown]
[im 12/266  lung]
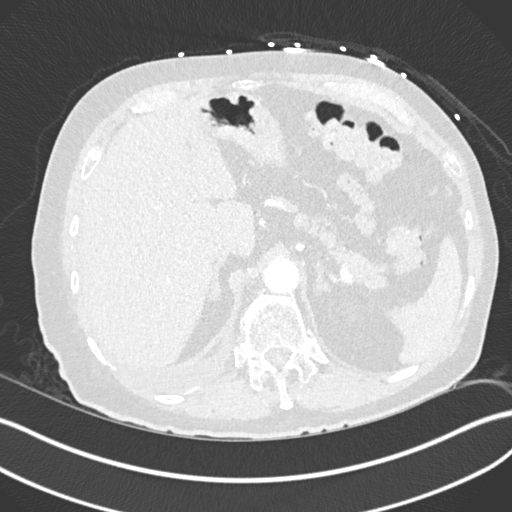
[im 35/266  soft-tissue]
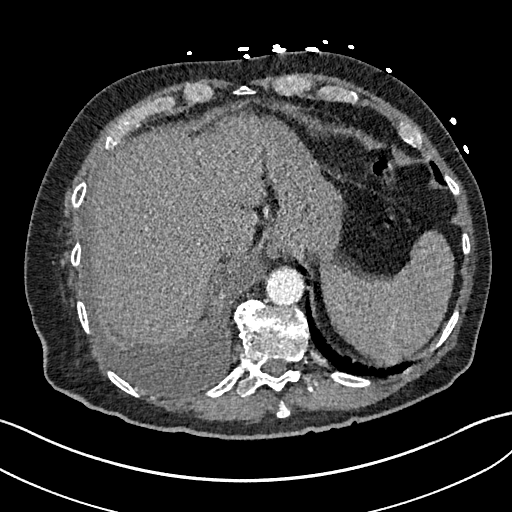
[im 47/266  lung]
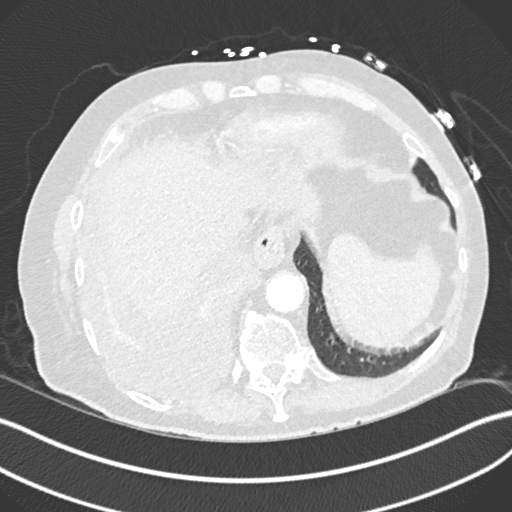
[im 70/266  soft-tissue]
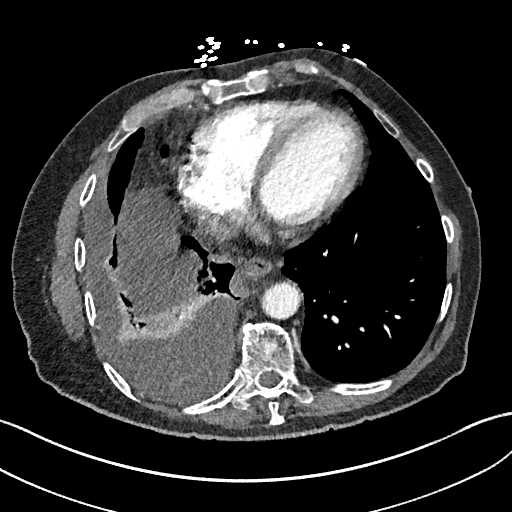
[im 81/266  lung]
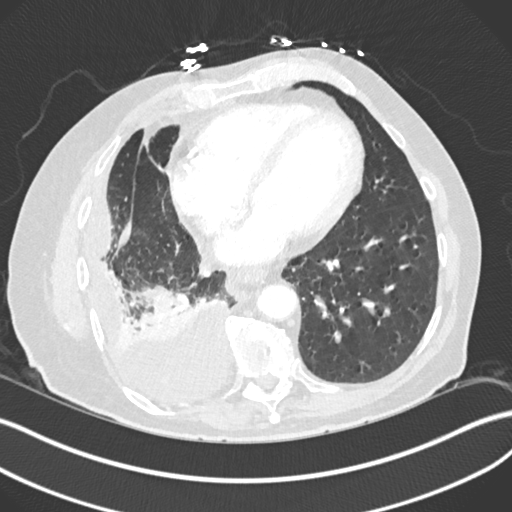
[im 104/266  soft-tissue]
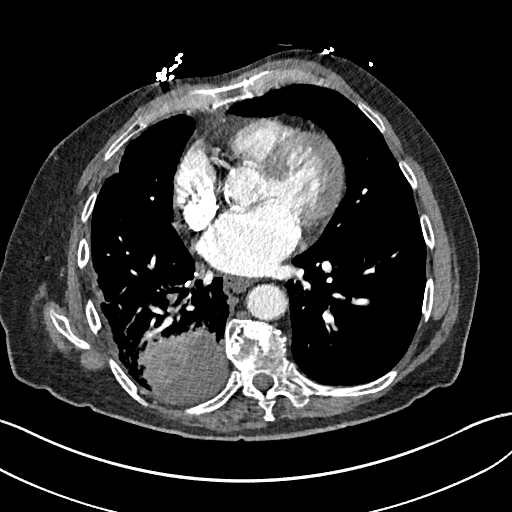
[im 116/266  lung]
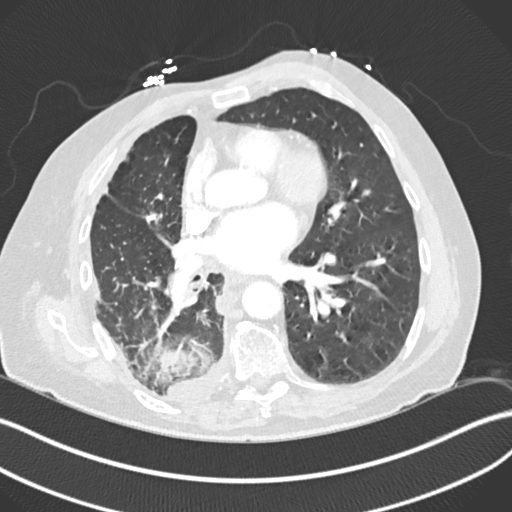
[im 139/266  soft-tissue]
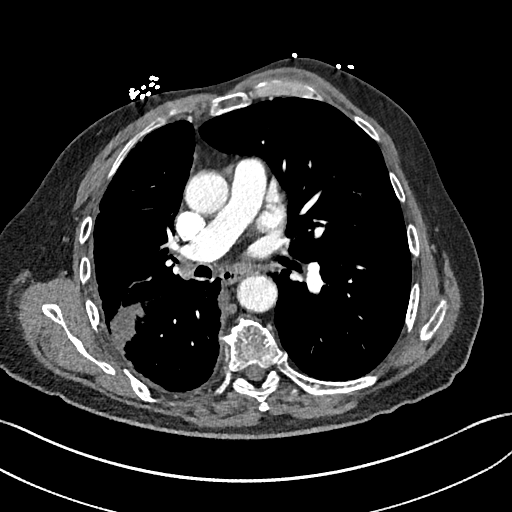
[im 150/266  lung]
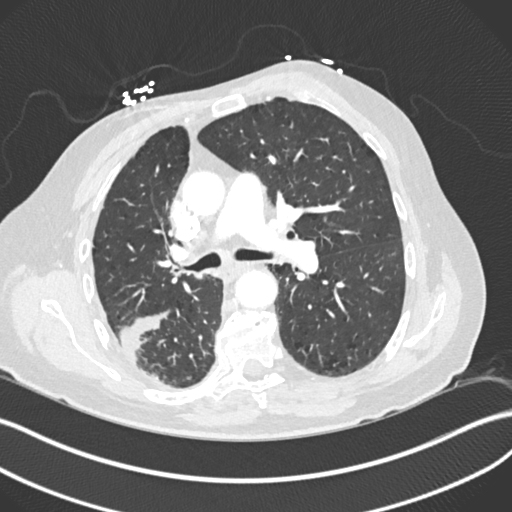
[im 162/266  soft-tissue]
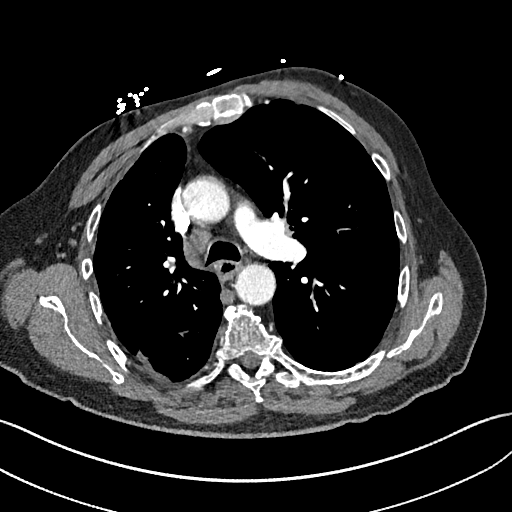
[im 185/266  lung]
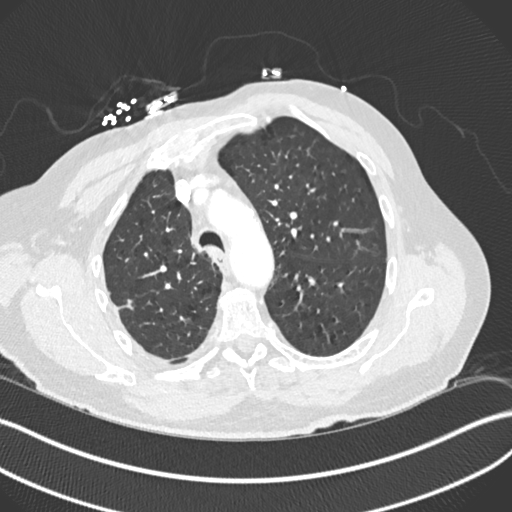
[im 196/266  soft-tissue]
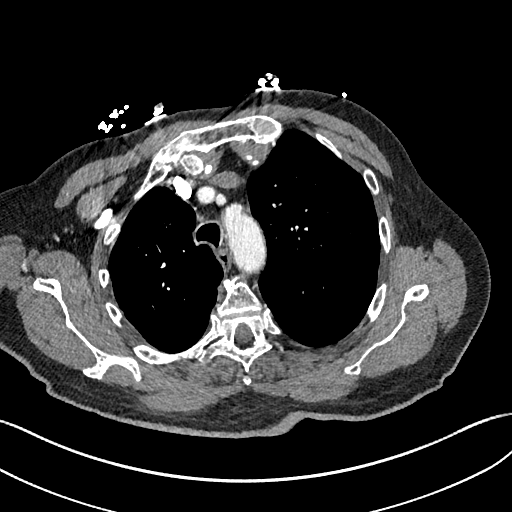
[im 219/266  lung]
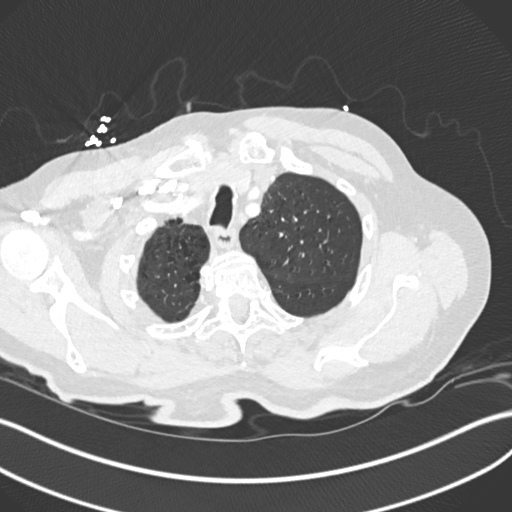
[im 231/266  soft-tissue]
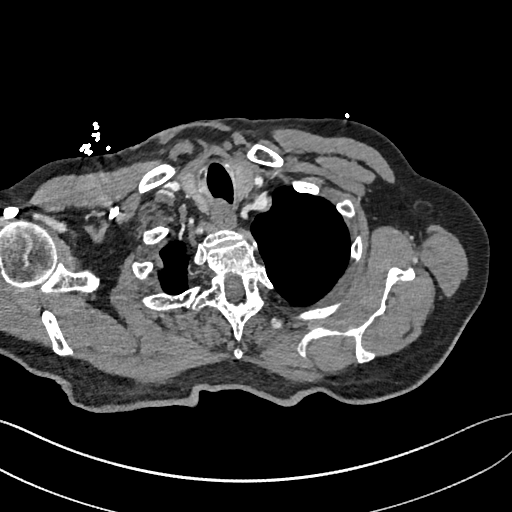
[im 254/266  lung]
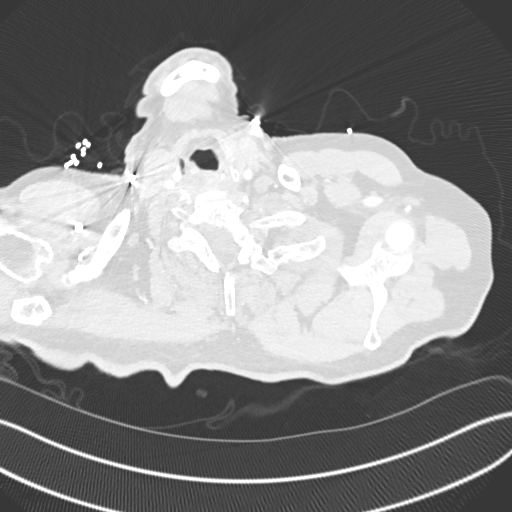

[Series 6: coronal mpr · coronal · 0.55mm/px · 2 of 110 slices shown]
[im 37/110  soft-tissue]
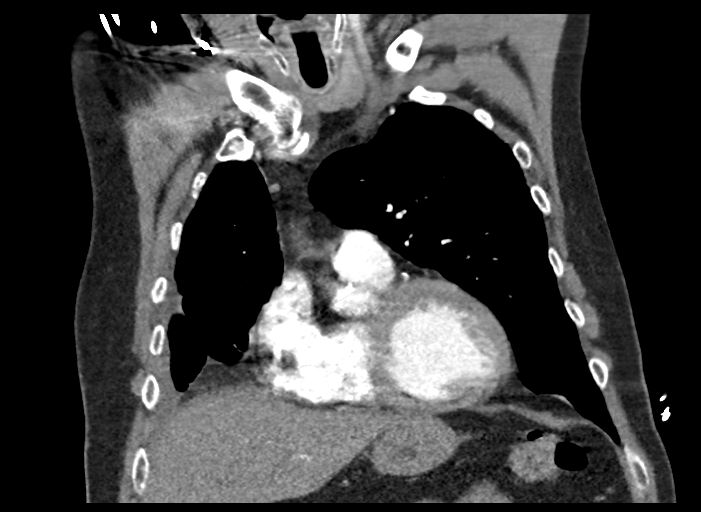
[im 73/110  soft-tissue]
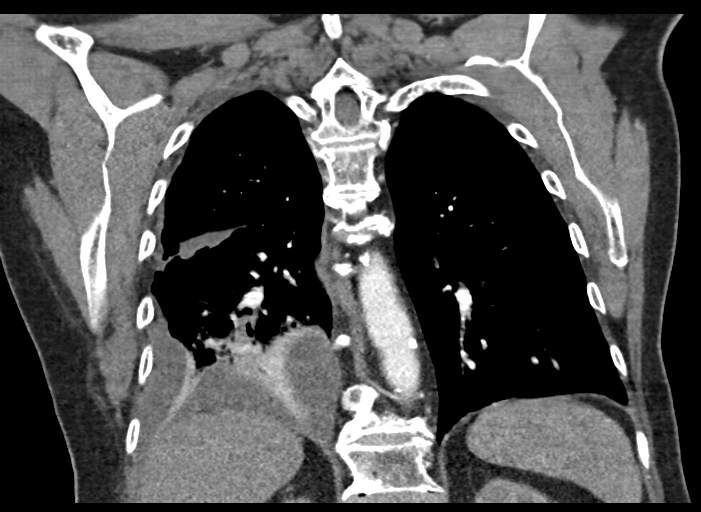

[17 of 46 positions shown; findings below may reference images not displayed]

FINDINGS: CTA CHEST FINDINGS

Cardiovascular: Atherosclerotic calcifications of thoracic aorta and
coronary arteries. Aorta normal caliber without aneurysm or
dissection. Heart unremarkable. No pericardial effusion. Pulmonary
arteries adequately opacified. Scattered respiratory motion
artifacts at the lower lobes. No definite pulmonary emboli
identified.

Mediastinum/Nodes: Esophagus unremarkable. Base of cervical region
normal appearance. 11 mm precarinal node slightly increased from
previous exam. Additional small normal size mediastinal lymph nodes
without additional thoracic adenopathy.

Lungs/Pleura: Partially loculated RIGHT pleural effusion.
Compressive atelectasis of RIGHT lower lobe. Prior RIGHT middle lobe
resection. Loculated fluid in RIGHT major fissure posteriorly.
Underlying emphysematous changes. No acute infiltrate, pleural
effusion or pneumothorax. Small focus of scarring in the RIGHT upper
lobe unchanged image 29.

Musculoskeletal: No acute osseous findings.

Review of the MIP images confirms the above findings.

CT ABDOMEN and PELVIS FINDINGS

Hepatobiliary: Calcified gallstone dependently in gallbladder
measuring 5 mm diameter. Gallbladder and liver otherwise normal
appearance.

Pancreas: Normal appearance

Spleen: Normal appearance

Adrenals/Urinary Tract: Adrenal glands kidneys, ureters, and bladder
normal appearance

Stomach/Bowel: Short segment of appendiceal base normal appearance.
Stomach and bowel loops normal appearance.

Vascular/Lymphatic: Atherosclerotic calcifications of aortic and
iliac arteries without aneurysm. No adenopathy.

Reproductive: Prostatic enlargement gland measuring 6.5 x 6.5 x
cm (volume = 120 cm^3)

Other: BILATERAL inguinal hernias containing fat larger on RIGHT. No
free air or free fluid. No inflammatory process. Tiny umbilical
hernia containing fat.

Musculoskeletal: Bones demineralized. Degenerative disc disease
changes thoracolumbar spine.

Review of the MIP images confirms the above findings.
IMPRESSION: No evidence of pulmonary embolism.

Partially loculated RIGHT pleural effusion with compressive
atelectasis of the RIGHT lower lobe.

Prior RIGHT middle lobe resection.

Single minimally enlarged precarinal lymph node 11 mm short axis.

No acute intra-abdominal or intrapelvic abnormalities.

Prostatic enlargement.

BILATERAL inguinal hernias containing fat larger on RIGHT.

Cholelithiasis.

Aortic Atherosclerosis (2PWWN-CLA.A) and Emphysema (2PWWN-FXN.G).

## 2022-04-21 IMAGING — DX DG CHEST 1V PORT
1 series · 1 of 1 positions shown · non-contrast
Comparison: 03/21/2020 and chest CT, 03/22/2020.

CLINICAL DATA: Status post thoracentesis.

EXAM:
PORTABLE CHEST 1 VIEW

[chest]
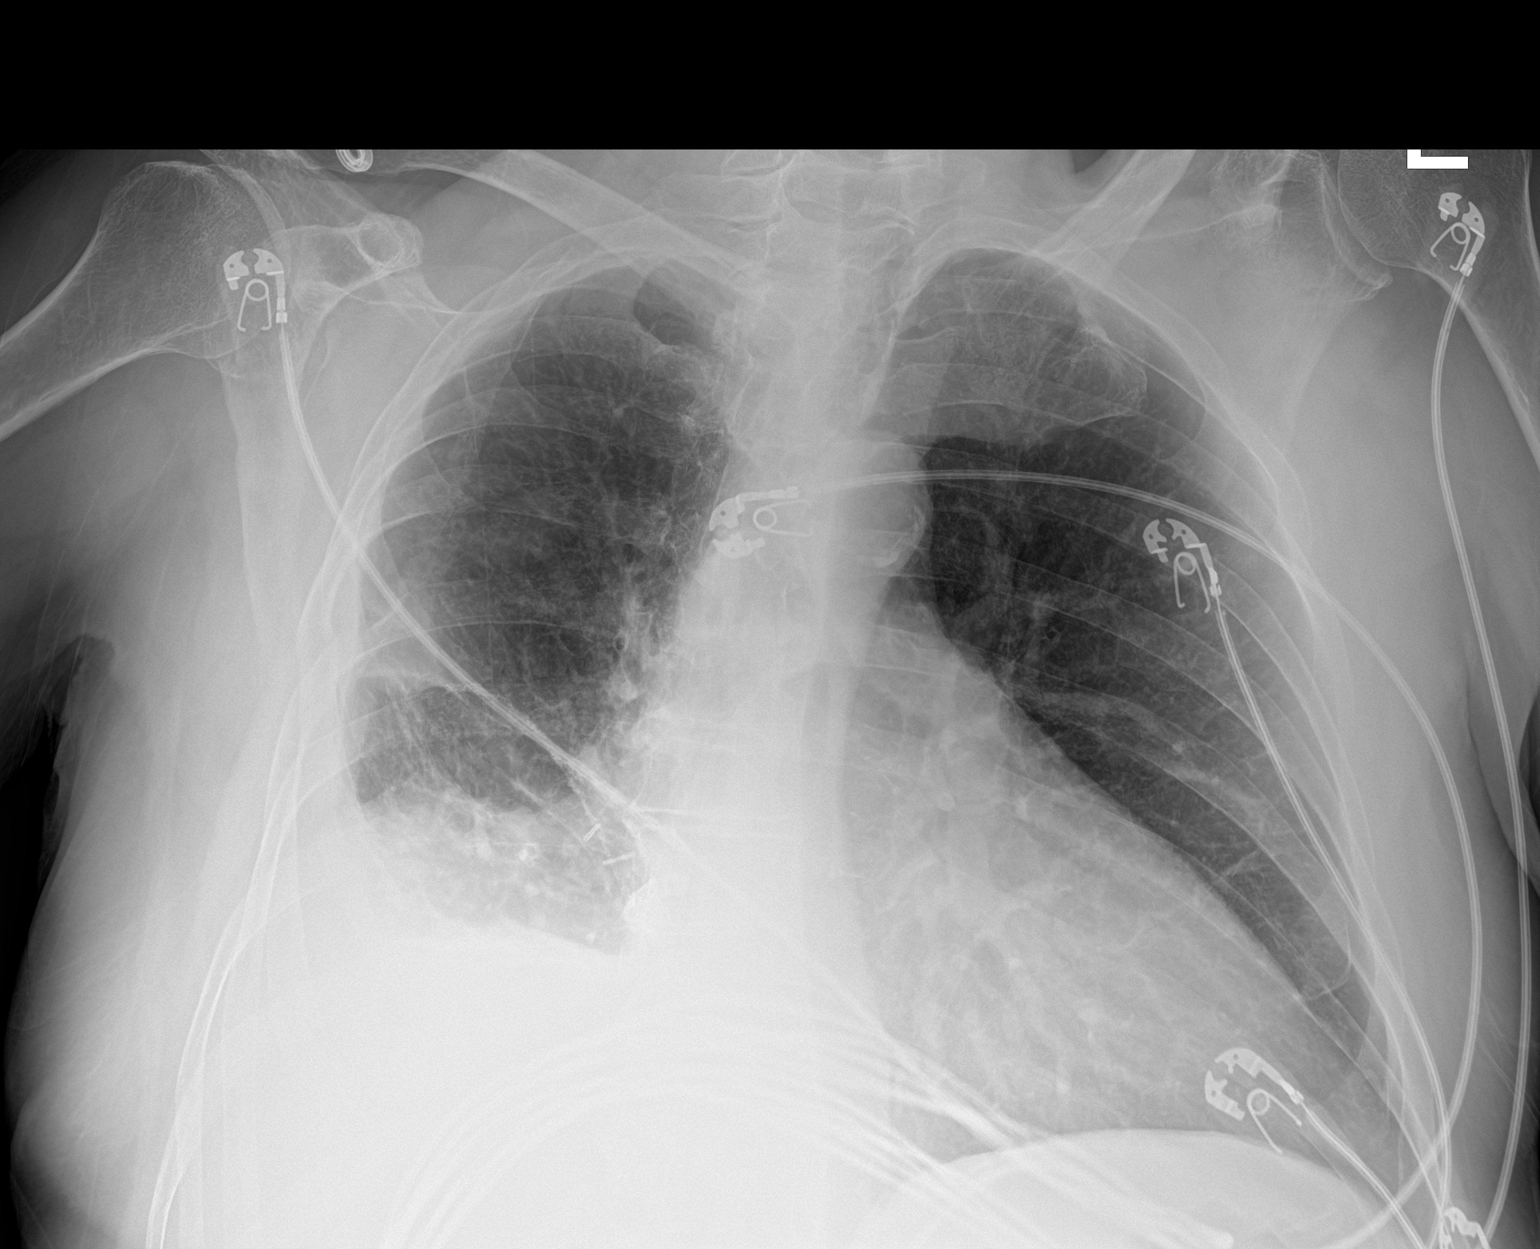

[1 of 1 positions shown; findings below may reference images not displayed]

FINDINGS: There is there is opacity at the right lung base at is similar to
the previous day's CT scan, consistent with a combination of
residual fluid and atelectasis. Postsurgical changes are noted with
right mid to lower lung anastomosis staples in the few surgical
vascular clips.

No pneumothorax.

Left lung is hyperexpanded but clear.
IMPRESSION: 1. No pneumothorax or evidence of a complication following
thoracentesis.
2. Persistent opacity at the right lung base consistent with a
combination of residual pleural fluid and atelectasis. Postsurgical
changes in the right lower lung are unchanged from the previous
chest radiograph.

## 2022-04-24 IMAGING — DX DG CHEST 1V PORT
1 series · 1 of 1 positions shown · non-contrast
Comparison: the previous day's study

CLINICAL DATA: Reason for exam: chest tube in place

EXAM:
PORTABLE CHEST - 1 VIEW

[chest ap]
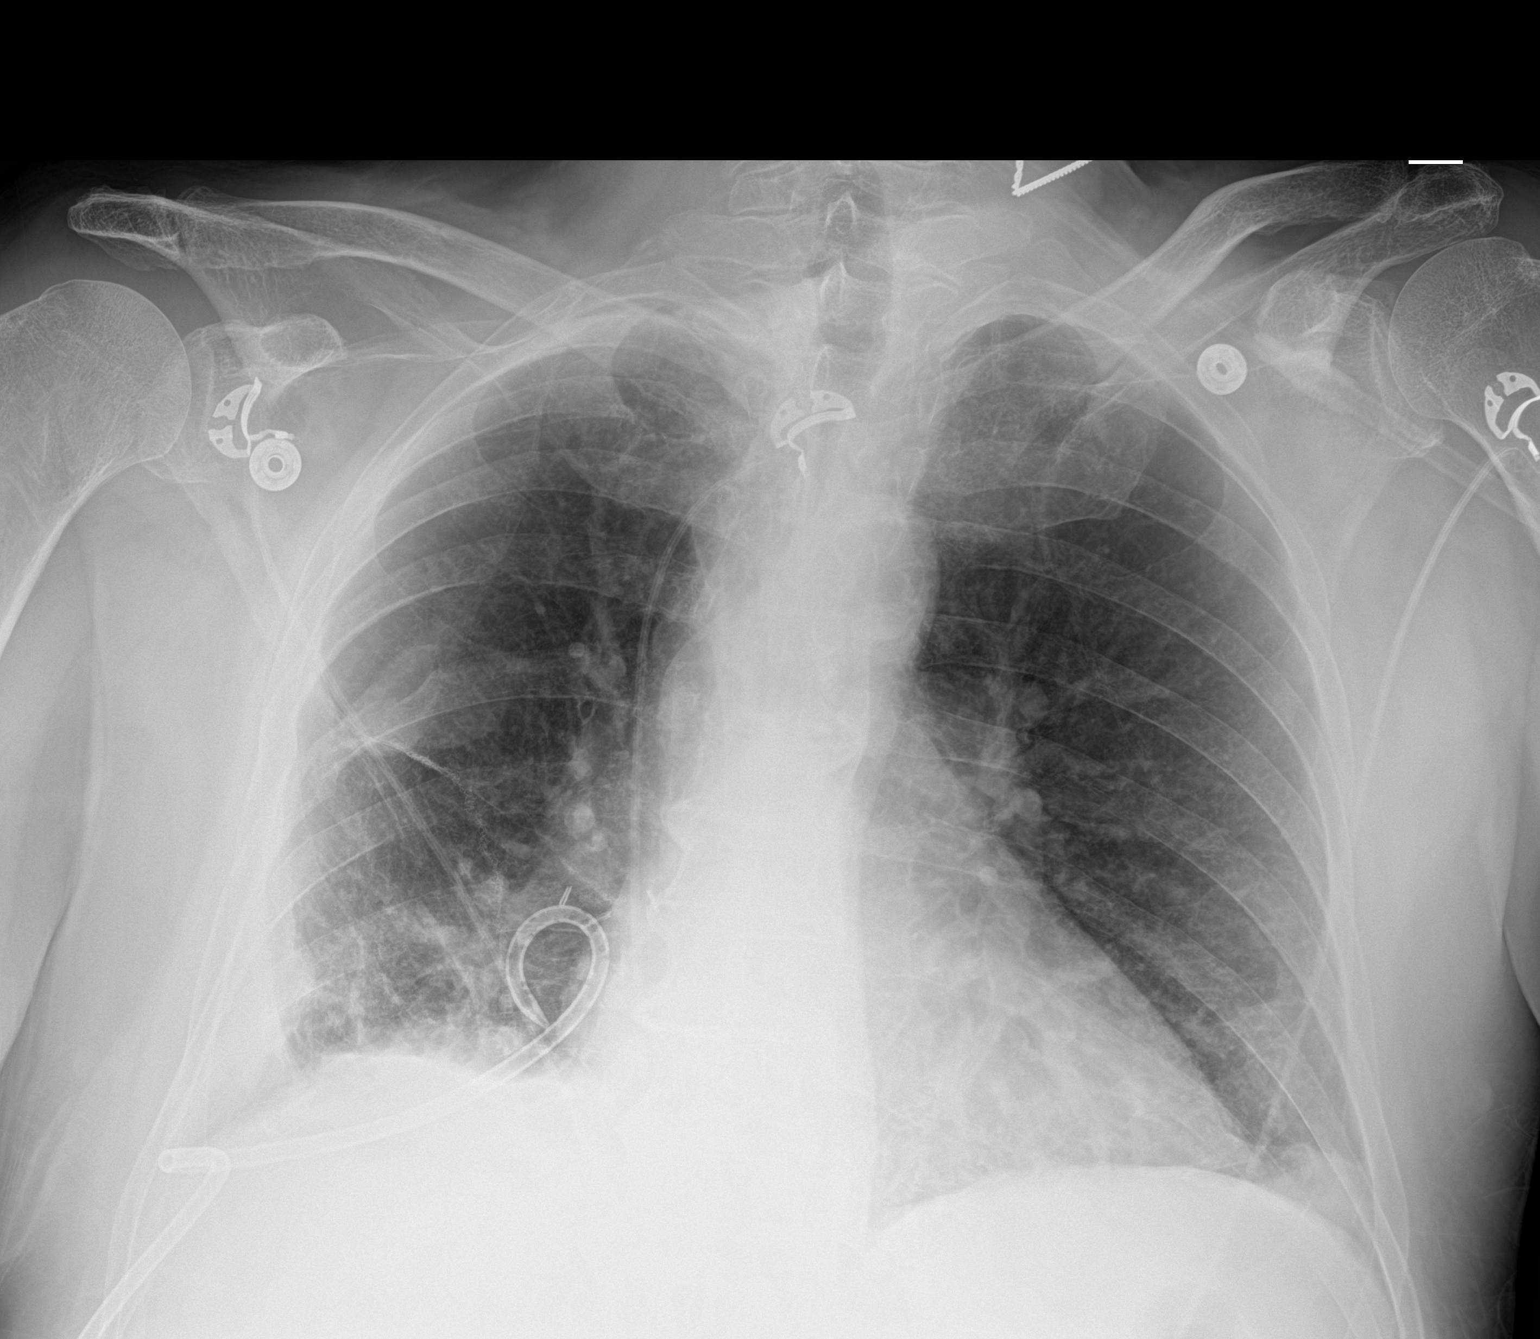

[1 of 1 positions shown; findings below may reference images not displayed]

FINDINGS: Stable pigtail chest tube at the right lung base. No pneumothorax.
Patchy atelectasis/consolidation at the lung bases right greater
than left. Staple lines in the right mid lung.

Heart size normal.  Aortic Atherosclerosis (NOKQ9-170.0).

There may be a small loculated right pleural effusion laterally at
the base.

Previous right thoracotomy.
IMPRESSION: Stable right chest tube with no pneumothorax.

## 2022-04-29 ENCOUNTER — Encounter: Payer: Self-pay | Admitting: Cardiovascular Disease

## 2022-04-29 ENCOUNTER — Ambulatory Visit: Payer: Medicare Other | Attending: Cardiovascular Disease | Admitting: Cardiovascular Disease

## 2022-04-29 VITALS — BP 122/72 | HR 64 | Ht 70.0 in | Wt 169.0 lb

## 2022-04-29 DIAGNOSIS — I2510 Atherosclerotic heart disease of native coronary artery without angina pectoris: Secondary | ICD-10-CM

## 2022-04-29 DIAGNOSIS — I10 Essential (primary) hypertension: Secondary | ICD-10-CM | POA: Diagnosis not present

## 2022-04-29 DIAGNOSIS — I2111 ST elevation (STEMI) myocardial infarction involving right coronary artery: Secondary | ICD-10-CM

## 2022-04-29 NOTE — Assessment & Plan Note (Signed)
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  Did have 40% ostial left main disease with an EF of 40% at that time with inferior akinesia.  Follow-up echo performed in 2008 showed normal LV systolic function and Myoview performed 5/18/2 send thousand 11 was nonischemic.  He denies chest pain or shortness of breath.

## 2022-04-29 NOTE — Patient Instructions (Signed)
Medication Instructions:  Your physician recommends that you continue on your current medications as directed. Please refer to the Current Medication list given to you today.  *If you need a refill on your cardiac medications before your next appointment, please call your pharmacy*   Follow-Up: At [facility], you and your health needs are our priority.  As part of our continuing mission to provide you with exceptional heart care, we have created designated Provider Care Teams.  These Care Teams include your primary Cardiologist (physician) and Advanced Practice Providers (APPs -  Physician Assistants and Nurse Practitioners) who all work together to provide you with the care you need, when you need it.  We recommend signing up for the patient portal called "MyChart".  Sign up information is provided on this After Visit Summary.  MyChart is used to connect with patients for Virtual Visits (Telemedicine).  Patients are able to view lab/test results, encounter notes, upcoming appointments, etc.  Non-urgent messages can be sent to your provider as well.   To learn more about what you can do with MyChart, go to [email or web address].    Your next appointment:   12 month(s)  The format for your next appointment:   In Person  Provider:   Jonathan Berry, MD   

## 2022-04-29 NOTE — Progress Notes (Signed)
04/29/2022 Mathew Hall   Aug 17, 1940  [id number]  Primary Physician Mathew Lopes, MD Primary Cardiologist: Lorretta Harp MD Mathew Hall, [place]  HPI:  Mathew Hall is a 81 y.o.  mildly overweight married Caucasian male father of 2 living children both of whom are daughters (both sons died at the age of 92), grandfather of 8 grandchildren who I  last saw in the office 09/04/2020.  He is accompanied by [name] who is also a patient of mine.  He has a history of CAD status post acute inferior wall myocardial infarction treated percutaneously with stenting February 18, 1998 with intervention of his ostium and mid dominant RCA. He did have a 40% ostial left main stenosis and an EF of 40%, inferior akinesis at that time. He had a normal abdominal aorta, renal arteries and iliac arteries. Followup echo performed in 2008 showed normal LV systolic function, and his most recent Myoview performed Nov 21, 2009 was normal as well. His other problems include hypertension, hyperlipidemia and GERD.    Since I saw him in the office a year and a half ago he continues to do well.  He was released by Dr. Earlie Hall and Dr. Roxan Hall and is "cancer free".  He denies chest pain or shortness of breath.     Current Meds  Medication Sig   amLODipine (NORVASC) 10 MG tablet Take 1 tablet (10 mg total) by mouth daily.   aspirin 325 MG EC tablet Take 1 tablet (325 mg total) by mouth daily. Please hold for 15 days when you are on Eliquis, resume on 10/11 once you are off Eliquis.   ergocalciferol (VITAMIN D2) 1.25 MG (50000 UT) capsule Take 50,000 Units by mouth every Friday.   Esomeprazole Magnesium 20 MG TBEC Take 20 mg by mouth daily at 12 noon.   Multiple Vitamin (MULTIVITAMIN WITH MINERALS) TABS tablet Take 1 tablet by mouth daily.   nitroGLYCERIN (NITROSTAT) 0.4 MG SL tablet Place 1 tablet (0.4 mg total) under the tongue every 5 (five) minutes as needed for chest pain.   oxybutynin  (DITROPAN) 5 MG tablet Take 5 mg by mouth every 8 (eight) hours as needed for bladder spasms.    oxyCODONE (ROXICODONE) 5 MG immediate release tablet Take 1 tablet (5 mg total) by mouth every 8 (eight) hours as needed. Alternate tylenol and ibuprofen for the first few days. Take narcotic pain medication only if needed for severe/ breakthrough pain.   phenylephrine (SUDAFED PE) 10 MG TABS tablet Take 10 mg by mouth every 4 (four) hours as needed (sinus congestion).   rosuvastatin (CRESTOR) 20 MG tablet Take 20 mg by mouth at bedtime.     Allergies  Allergen Reactions   Lipitor [Atorvastatin Calcium] Other (See Comments)    Knee pain    Amoxicillin Rash    Social History   Socioeconomic History   Marital status: Married    Spouse name: Not on file   Number of children: 2   Years of education: Not on file   Highest education level: Not on file  Occupational History   Occupation: retired  Tobacco Use   Smoking status: Former    Packs/day: 2.00    Types: Cigarettes    Quit date: 07/16/1997    Years since quitting: 24.8   Smokeless tobacco: Never  Vaping Use   Vaping Use: Never used  Substance and Sexual Activity   Alcohol use: Yes    Comment: occasionally  Drug use: No   Sexual activity: Not Currently  Other Topics Concern   Not on file  Social History Narrative   Not on file   Social Determinants of Health   Financial Resource Strain: Not on file  Food Insecurity: Not on file  Transportation Needs: Not on file  Physical Activity: Not on file  Stress: Not on file  Social Connections: Not on file  Intimate Partner Violence: Not on file     Review of Systems: General: negative for chills, fever, night sweats or weight changes.  Cardiovascular: negative for chest pain, dyspnea on exertion, edema, orthopnea, palpitations, paroxysmal nocturnal dyspnea or shortness of breath Dermatological: negative for rash Respiratory: negative for cough or wheezing Urologic:  negative for hematuria Abdominal: negative for nausea, vomiting, diarrhea, bright red blood per rectum, melena, or hematemesis Neurologic: negative for visual changes, syncope, or dizziness All other systems reviewed and are otherwise negative except as noted above.    Blood pressure 122/72, pulse 64, height 5\' 10"  (1.778 m), weight 169 lb (76.7 kg), SpO2 96 %.  General appearance: alert and no distress Neck: no adenopathy, no carotid bruit, no JVD, supple, symmetrical, trachea midline, and thyroid not enlarged, symmetric, no tenderness/mass/nodules Lungs: clear to auscultation bilaterally Heart: regular rate and rhythm, S1, S2 normal, no murmur, click, rub or gallop Extremities: extremities normal, atraumatic, no cyanosis or edema Pulses: 2+ and symmetric Skin: Skin color, texture, turgor normal. No rashes or lesions Neurologic: Grossly normal  EKG sinus rhythm at 64 with right bundle branch block and occasional PVCs.  I personally reviewed this EKG.  ASSESSMENT AND PLAN:   HYPERLIPIDEMIA History of hyperlipidemia on statin therapy recently changed from simvastatin to rosuvastatin by his PCP.  His last lipid profile performed/17/23 revealed a total cholesterol 133, LDL of 94 and HDL 29, not at goal for secondary prevention.  Essential hypertension History of essential hypertension blood pressure measured today at 122/72.  He is on amlodipine.  Acute myocardial infarction History of CAD status post inferior wall myocardial infarction 02/18/1998 treated with intervention of the ostium of this dominant RCA.  Did have 40% ostial left main disease with an EF of 40% at that time with inferior akinesia.  Follow-up echo performed in 2008 showed normal LV systolic function and Myoview performed 5/18/2 send thousand 11 was nonischemic.  He denies chest pain or shortness of breath.     Lorretta Harp MD FACP,FACC,FAHA, [facility] 04/29/2022 8:32 AM

## 2022-04-29 NOTE — Assessment & Plan Note (Signed)
History of hyperlipidemia on statin therapy recently changed from simvastatin to rosuvastatin by his PCP.  His last lipid profile performed/17/23 revealed a total cholesterol 133, LDL of 94 and HDL 29, not at goal for secondary prevention.

## 2022-04-29 NOTE — Assessment & Plan Note (Signed)
History of essential hypertension blood pressure measured today at 122/72.  He is on amlodipine.

## 2022-06-04 ENCOUNTER — Other Ambulatory Visit: Payer: Self-pay | Admitting: Cardiovascular Disease

## 2022-07-11 ENCOUNTER — Other Ambulatory Visit: Payer: Self-pay | Admitting: Cardiovascular Disease

## 2022-10-23 ENCOUNTER — Telehealth: Payer: Self-pay | Admitting: Cardiovascular Disease

## 2022-10-23 NOTE — Telephone Encounter (Signed)
[employer] called in stating the pt received a letter from [facility], which stated [place] is no longer contracted with [facility]. [employer] would like a c/b to pt to advise him on if this is true or not and what this means regarding his care.

## 2022-10-23 NOTE — Telephone Encounter (Signed)
The number for [employer] is an outgoing number only.  Call to 800 number and unable to get through to customer service rep.  See no letter in patient chart stating this information.

## 2022-12-30 LAB — LAB REPORT - SCANNED
A1c: 5.3
EGFR: 71.7

## 2023-04-27 ENCOUNTER — Encounter: Payer: Self-pay | Admitting: Cardiovascular Disease

## 2023-04-27 ENCOUNTER — Ambulatory Visit: Payer: Medicare Other | Attending: Cardiovascular Disease | Admitting: Cardiovascular Disease

## 2023-04-27 VITALS — BP 134/68 | HR 68 | Ht 70.0 in | Wt 157.0 lb

## 2023-04-27 DIAGNOSIS — I2111 ST elevation (STEMI) myocardial infarction involving right coronary artery: Secondary | ICD-10-CM

## 2023-04-27 DIAGNOSIS — I2510 Atherosclerotic heart disease of native coronary artery without angina pectoris: Secondary | ICD-10-CM | POA: Diagnosis not present

## 2023-04-27 DIAGNOSIS — I10 Essential (primary) hypertension: Secondary | ICD-10-CM

## 2023-04-27 NOTE — Assessment & Plan Note (Signed)
History of hyperlipidemia on simvastatin in the past, currently on rosuvastatin 20 mg a day.  His last lipid profile performed 10/21/21 revealed total cholesterol of 133, LDL of 94 HDL 29, not at goal for secondary prevention.  Will check a fasting lipid liver profile this morning.

## 2023-04-27 NOTE — Patient Instructions (Signed)
Medication Instructions:  Your physician recommends that you continue on your current medications as directed. Please refer to the Current Medication list given to you today.  *If you need a refill on your cardiac medications before your next appointment, please call your pharmacy*   Lab Work: Your physician recommends that you have labs drawn today: Lipid/Liver panel  If you have labs (blood work) drawn today and your tests are completely normal, you will receive your results only by: MyChart Message (if you have MyChart) OR A paper copy in the mail If you have any lab test that is abnormal or we need to change your treatment, we will call you to review the results.   Follow-Up: At [facility], you and your health needs are our priority.  As part of our continuing mission to provide you with exceptional heart care, we have created designated Provider Care Teams.  These Care Teams include your primary Cardiologist (physician) and Advanced Practice Providers (APPs -  Physician Assistants and Nurse Practitioners) who all work together to provide you with the care you need, when you need it.  We recommend signing up for the patient portal called "MyChart".  Sign up information is provided on this After Visit Summary.  MyChart is used to connect with patients for Virtual Visits (Telemedicine).  Patients are able to view lab/test results, encounter notes, upcoming appointments, etc.  Non-urgent messages can be sent to your provider as well.   To learn more about what you can do with MyChart, go to [email or web address].    Your next appointment:   12 month(s)  Provider:   Quay Burow, MD

## 2023-04-27 NOTE — Assessment & Plan Note (Signed)
History of inferior STEMI treated with PCI and stenting by myself 02/18/1998 with intervention of his RCA ostium and mid dominant RCA.  He did have 40% ostial left main stenosis and EF of 40% with inferior hypokinesia at that time.  Ultimately his LV function normalized by echo in 2008 and Myoview performed 11/21/2009 was nonischemic.  He is asymptomatic.

## 2023-04-27 NOTE — Assessment & Plan Note (Signed)
History of essential hypertension with blood pressure measured today at 134/68.  He is on amlodipine.

## 2023-04-27 NOTE — Progress Notes (Signed)
04/27/2023 Mathew Hall   [birth year]  [id number]  Primary Physician Garlan Fillers, MD Primary Cardiologist: Runell Gess MD Mathew Hall, [place]  HPI:  Mathew Hall is a 82 y.o. mildly overweight married Caucasian male father of 2 living children both of whom are daughters (both sons died at the age of 74), grandfather of 8 grandchildren who I  last saw in the office 04/25/2022.  [name] who is also a patient of mine.  He has a history of CAD status post acute inferior wall myocardial infarction treated percutaneously with stenting February 18, 1998 with intervention of his ostium and mid dominant RCA. He did have a 40% ostial left main stenosis and an EF of 40%, inferior akinesis at that time. He had a normal abdominal aorta, renal arteries and iliac arteries. Followup echo performed in 2008 showed normal LV systolic function, and his most recent Myoview performed Nov 21, 2009 was normal as well. His other problems include hypertension, hyperlipidemia and GERD.    Since I saw him in the office a year and a half ago he continues to do well.  He denies chest pain or shortness of breath.     Current Meds  Medication Sig   amLODipine (NORVASC) 10 MG tablet Take 1 tablet (10 mg total) by mouth daily.   aspirin 325 MG EC tablet Take 1 tablet (325 mg total) by mouth daily. Please hold for 15 days when you are on Eliquis, resume on 10/11 once you are off Eliquis.   Esomeprazole Magnesium 20 MG TBEC Take 20 mg by mouth daily at 12 noon.   nitroGLYCERIN (NITROSTAT) 0.4 MG SL tablet Place 1 tablet (0.4 mg total) under the tongue every 5 (five) minutes as needed for chest pain.   phenylephrine (SUDAFED PE) 10 MG TABS tablet Take 10 mg by mouth every 4 (four) hours as needed (sinus congestion).   rosuvastatin (CRESTOR) 20 MG tablet Take 20 mg by mouth at bedtime.   [DISCONTINUED] ergocalciferol (VITAMIN D2) 1.25 MG (50000 UT) capsule Take 50,000 Units by mouth every Friday.    [DISCONTINUED] Multiple Vitamin (MULTIVITAMIN WITH MINERALS) TABS tablet Take 1 tablet by mouth daily.   [DISCONTINUED] oxybutynin (DITROPAN) 5 MG tablet Take 5 mg by mouth every 8 (eight) hours as needed for bladder spasms.      Allergies  Allergen Reactions   Lipitor [Atorvastatin Calcium] Other (See Comments)    Knee pain    Amoxicillin Rash    Social History   Socioeconomic History   Marital status: Married    Spouse name: Not on file   Number of children: 2   Years of education: Not on file   Highest education level: Not on file  Occupational History   Occupation: retired  Tobacco Use   Smoking status: Former    Current packs/day: 0.00    Types: Cigarettes    Quit date: 07/16/1997    Years since quitting: 25.7   Smokeless tobacco: Never  Vaping Use   Vaping status: Never Used  Substance and Sexual Activity   Alcohol use: Yes    Comment: occasionally   Drug use: No   Sexual activity: Not Currently  Other Topics Concern   Not on file  Social History Narrative   Not on file   Social Determinants of Health   Financial Resource Strain: Not on file  Food Insecurity: Not on file  Transportation Needs: Not on file  Physical Activity: Not on  file  Stress: Not on file  Social Connections: Not on file  Intimate Partner Violence: Not on file     Review of Systems: General: negative for chills, fever, night sweats or weight changes.  Cardiovascular: negative for chest pain, dyspnea on exertion, edema, orthopnea, palpitations, paroxysmal nocturnal dyspnea or shortness of breath Dermatological: negative for rash Respiratory: negative for cough or wheezing Urologic: negative for hematuria Abdominal: negative for nausea, vomiting, diarrhea, bright red blood per rectum, melena, or hematemesis Neurologic: negative for visual changes, syncope, or dizziness All other systems reviewed and are otherwise negative except as noted above.    Blood pressure 134/68, pulse  68, height 5\' 10"  (1.778 m), weight 157 lb (71.2 kg).  General appearance: alert and no distress Neck: no adenopathy, no carotid bruit, no JVD, supple, symmetrical, trachea midline, and thyroid not enlarged, symmetric, no tenderness/mass/nodules Lungs: clear to auscultation bilaterally Heart: regular rate and rhythm, S1, S2 normal, no murmur, click, rub or gallop Extremities: extremities normal, atraumatic, no cyanosis or edema Pulses: 2+ and symmetric Skin: Skin color, texture, turgor normal. No rashes or lesions Neurologic: Grossly normal  EKG EKG Interpretation Date/Time:  Monday April 27 2023 10:01:50 EDT Ventricular Rate:  68 PR Interval:  178 QRS Duration:  136 QT Interval:  440 QTC Calculation: 467 R Axis:   93  Text Interpretation: Normal sinus rhythm Right bundle branch block When compared with ECG of 09-May-2020 11:00, T wave inversion less evident in Inferior leads Confirmed by Nanetta Batty [phone]) on 04/27/2023 10:29:37 AM    ASSESSMENT AND PLAN:   HYPERLIPIDEMIA History of hyperlipidemia on simvastatin in the past, currently on rosuvastatin 20 mg a day.  His last lipid profile performed 10/21/21 revealed total cholesterol of 133, LDL of 94 HDL 29, not at goal for secondary prevention.  Will check a fasting lipid liver profile this morning.  Essential hypertension History of essential hypertension with blood pressure measured today at 134/68.  He is on amlodipine.  Acute myocardial infarction History of inferior STEMI treated with PCI and stenting by myself 02/18/1998 with intervention of his RCA ostium and mid dominant RCA.  He did have 40% ostial left main stenosis and EF of 40% with inferior hypokinesia at that time.  Ultimately his LV function normalized by echo in 2008 and Myoview performed 11/21/2009 was nonischemic.  He is asymptomatic.     Runell Gess MD FACP,FACC,FAHA, [facility] 04/27/2023 10:36 AM

## 2023-04-28 LAB — HEPATIC FUNCTION PANEL
ALT: 10 [IU]/L (ref 0–44)
AST: 17 [IU]/L (ref 0–40)
Albumin: 4.2 g/dL (ref 3.7–4.7)
Alkaline Phosphatase: 94 [IU]/L (ref 44–121)
Bilirubin Total: 0.6 mg/dL (ref 0.0–1.2)
Bilirubin, Direct: 0.15 mg/dL (ref 0.00–0.40)
Total Protein: 7.5 g/dL (ref 6.0–8.5)

## 2023-04-28 LAB — LIPID PANEL
Chol/HDL Ratio: 3.2 ratio (ref 0.0–5.0)
Cholesterol, Total: 130 mg/dL (ref 100–199)
HDL: 41 mg/dL (ref >39)
LDL Chol Calc (NIH): 76 mg/dL (ref 0–99)
Triglycerides: 61 mg/dL (ref 0–149)
VLDL Cholesterol Cal: 13 mg/dL (ref 5–40)

## 2024-08-10 ENCOUNTER — Emergency Department (HOSPITAL_COMMUNITY)
Admission: EM | Admit: 2024-08-10 | Discharge: 2024-08-11 | Disposition: A | Attending: Emergency Medicine | Admitting: Emergency Medicine

## 2024-08-10 ENCOUNTER — Other Ambulatory Visit (HOSPITAL_BASED_OUTPATIENT_CLINIC_OR_DEPARTMENT_OTHER): Payer: Self-pay | Admitting: Family Medicine

## 2024-08-10 ENCOUNTER — Ambulatory Visit (HOSPITAL_BASED_OUTPATIENT_CLINIC_OR_DEPARTMENT_OTHER)

## 2024-08-10 ENCOUNTER — Emergency Department (HOSPITAL_COMMUNITY)

## 2024-08-10 ENCOUNTER — Encounter (HOSPITAL_BASED_OUTPATIENT_CLINIC_OR_DEPARTMENT_OTHER): Payer: Self-pay

## 2024-08-10 ENCOUNTER — Encounter (HOSPITAL_COMMUNITY): Payer: Self-pay

## 2024-08-10 ENCOUNTER — Other Ambulatory Visit: Payer: Self-pay

## 2024-08-10 ENCOUNTER — Other Ambulatory Visit: Payer: Self-pay | Admitting: Family Medicine

## 2024-08-10 ENCOUNTER — Other Ambulatory Visit (HOSPITAL_BASED_OUTPATIENT_CLINIC_OR_DEPARTMENT_OTHER)

## 2024-08-10 DIAGNOSIS — W1839XA Other fall on same level, initial encounter: Secondary | ICD-10-CM | POA: Insufficient documentation

## 2024-08-10 DIAGNOSIS — J939 Pneumothorax, unspecified: Secondary | ICD-10-CM

## 2024-08-10 DIAGNOSIS — I2510 Atherosclerotic heart disease of native coronary artery without angina pectoris: Secondary | ICD-10-CM | POA: Insufficient documentation

## 2024-08-10 DIAGNOSIS — J320 Chronic maxillary sinusitis: Secondary | ICD-10-CM | POA: Insufficient documentation

## 2024-08-10 DIAGNOSIS — Z7982 Long term (current) use of aspirin: Secondary | ICD-10-CM | POA: Insufficient documentation

## 2024-08-10 DIAGNOSIS — J168 Pneumonia due to other specified infectious organisms: Secondary | ICD-10-CM | POA: Insufficient documentation

## 2024-08-10 DIAGNOSIS — J181 Lobar pneumonia, unspecified organism: Secondary | ICD-10-CM | POA: Insufficient documentation

## 2024-08-10 DIAGNOSIS — Z79899 Other long term (current) drug therapy: Secondary | ICD-10-CM | POA: Insufficient documentation

## 2024-08-10 DIAGNOSIS — I10 Essential (primary) hypertension: Secondary | ICD-10-CM | POA: Insufficient documentation

## 2024-08-10 DIAGNOSIS — J189 Pneumonia, unspecified organism: Secondary | ICD-10-CM

## 2024-08-10 DIAGNOSIS — S2241XA Multiple fractures of ribs, right side, initial encounter for closed fracture: Secondary | ICD-10-CM | POA: Insufficient documentation

## 2024-08-10 DIAGNOSIS — Z85118 Personal history of other malignant neoplasm of bronchus and lung: Secondary | ICD-10-CM | POA: Insufficient documentation

## 2024-08-10 DIAGNOSIS — J45909 Unspecified asthma, uncomplicated: Secondary | ICD-10-CM | POA: Insufficient documentation

## 2024-08-10 DIAGNOSIS — F4321 Adjustment disorder with depressed mood: Secondary | ICD-10-CM | POA: Insufficient documentation

## 2024-08-10 LAB — CBC WITH DIFFERENTIAL/PLATELET
Abs Immature Granulocytes: 0.03 10*3/uL (ref 0.00–0.07)
Basophils Absolute: 0 10*3/uL (ref 0.0–0.1)
Basophils Relative: 0 %
Eosinophils Absolute: 0 10*3/uL (ref 0.0–0.5)
Eosinophils Relative: 0 %
HCT: 38.6 % — ABNORMAL LOW (ref 39.0–52.0)
Hemoglobin: 13.2 g/dL (ref 13.0–17.0)
Immature Granulocytes: 0 %
Lymphocytes Relative: 9 %
Lymphs Abs: 0.8 10*3/uL (ref 0.7–4.0)
MCH: 30.3 pg (ref 26.0–34.0)
MCHC: 34.2 g/dL (ref 30.0–36.0)
MCV: 88.5 fL (ref 80.0–100.0)
Monocytes Absolute: 1 10*3/uL (ref 0.1–1.0)
Monocytes Relative: 11 %
Neutro Abs: 7.1 10*3/uL (ref 1.7–7.7)
Neutrophils Relative %: 80 %
Platelets: 242 10*3/uL (ref 150–400)
RBC: 4.36 MIL/uL (ref 4.22–5.81)
RDW: 12.8 % (ref 11.5–15.5)
WBC: 9 10*3/uL (ref 4.0–10.5)
nRBC: 0 % (ref 0.0–0.2)

## 2024-08-10 LAB — BASIC METABOLIC PANEL WITH GFR
Anion gap: 14 (ref 5–15)
BUN: 19 mg/dL (ref 8–23)
CO2: 23 mmol/L (ref 22–32)
Calcium: 9.4 mg/dL (ref 8.9–10.3)
Chloride: 98 mmol/L (ref 98–111)
Creatinine, Ser: 0.93 mg/dL (ref 0.61–1.24)
GFR, Estimated: >60 mL/min
Glucose, Bld: 103 mg/dL — ABNORMAL HIGH (ref 70–99)
Potassium: 3.5 mmol/L (ref 3.5–5.1)
Sodium: 134 mmol/L — ABNORMAL LOW (ref 135–145)

## 2024-08-10 NOTE — ED Provider Triage Note (Signed)
 Emergency Medicine Provider Triage Evaluation Note  Mathew KOEPP , a 84 y.o. male  was evaluated in triage.  Pt complains of sob.  Patient endorsed having sinus congestion unable to breathe through his nose for more than a month.  Has been using Afrin daily and that provided no relief.  He is mouth breathing and he endorses increased shortness of breath due to that.  He also report having a fall several days ago but denies any significant injury from the fall and denies any chest pain from the fall.  Had an x-ray that was done today and his doctor said that there is a possible pneumothorax on the right side.  Report remote history of lung cancer with surgery to his right lung in the past.  Patient also voiced frustration of the inability to breathe and is having passive suicidal thought due to it.  Review of Systems  Positive: As above Negative: As above  Physical Exam  BP (!) 147/73 (BP Location: Right Arm)   Pulse 88   Temp 98.1 F (36.7 C)   Resp 16   Ht 5' 10 (1.778 m)   Wt 71.2 kg   SpO2 96%   BMI 22.52 kg/m  Gen:   Awake, no distress   Resp:  Normal effort  MSK:   Moves extremities without difficulty  Other:    Medical Decision Making  Medically screening exam initiated at 6:47 PM.  Appropriate orders placed.  Ubaldo KATHEE Arrant was informed that the remainder of the evaluation will be completed by another provider, this initial triage assessment does not replace that evaluation, and the importance of remaining in the ED until their evaluation is complete.     Nivia Colon, PA-C 08/10/24 1849

## 2024-08-10 NOTE — ED Triage Notes (Signed)
 Pt came in via POV from his providers office after getting a chest xray d/t recent SOB & find that he had a collapsed lung on the right side (per pt).

## 2024-08-11 ENCOUNTER — Ambulatory Visit (HOSPITAL_BASED_OUTPATIENT_CLINIC_OR_DEPARTMENT_OTHER): Admission: RE | Admit: 2024-08-11

## 2024-08-11 MED ORDER — CEFPODOXIME PROXETIL 200 MG PO TABS: 200.0000 mg | ORAL_TABLET | Freq: Two times a day (BID) | ORAL | 0 refills | Status: AC

## 2024-08-11 MED ORDER — DOXYCYCLINE HYCLATE 100 MG PO CAPS: 100.0000 mg | ORAL_CAPSULE | Freq: Two times a day (BID) | ORAL | 0 refills | Status: AC

## 2024-08-11 NOTE — ED Provider Notes (Signed)
 " [facility] AT [facility] Provider Note   CSN: [id number] Arrival date & time: 08/10/24  1756     Patient presents with: SOB and Abnormal Imaging   Mathew Hall is a 84 y.o. male.   HPI     84 year old male with a history of hypertension, hyperlipidemia, coronary artery disease, asthma, lung cancer, who presents with concern for congestion with difficulty breathing, fall with mild right rib pain and possible collapsed lung on XR with PCP.   In November went to Dr, about every night would wake up with nose stuffed, took OTC cough/cold and would get better, the put him on a medication, and nasal congestion worse, after that had to use nasal spray to get it open. Sent to ENT doctor and saw her for about 8 days, she put him on pill and nasal spray, nasal spray wouldn't help, begged her to get him seomething to open up his nasal passageways.  ENT told him to come back, tried the nasal spray again and didn't help. Went back to his dr, put on singulair which didn't help.  On otc allergy pillnow, sent to [place], see ENT there, said no thank you to that surgery because could not handle the recovery of not being able to breathe out of nose.  Using afrin hasal spray, [facility] brand nasal spray.  Felt like the sprays not lasting as long as they should so Sunday, fell on snow and ice on right side, 3 hours later was sore. Called dr office, to talk about this and nasal spray. They did an XR and said his right lung looked a little cloudy.   [place] they would order a ct and sent him home, about 430 that evening they called and said come in.  Dr. Jakie has put him on anxiety medication, has tried a psychiatrist and everyone is full or doesn't take medicare. 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 Over the last 2 weeks has been feeling more depressed about this situation.  Pain from the fall not that bad, when cough is about a  3 More congestion than cough, no other chest pain, a few days ago thinks he had a fever but dr office didn't . Was 100.2 at home he thinks a few days ago. 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 Authorizing Provider  cefpodoxime  (VANTIN ) 200 MG tablet Take 1 tablet (200 mg total) by mouth 2 (two) times  daily for 7 days. 08/11/24 08/18/24 Yes Dreama Longs, MD  doxycycline  (VIBRAMYCIN ) 100 MG capsule Take 1 capsule (100 mg total) by mouth 2 (two) times daily for 7 days. 08/11/24 08/18/24 Yes Dreama Longs, MD  amLODipine  (NORVASC ) 10 MG tablet Take 1 tablet (10 mg total) by mouth daily. 05/28/20   Rojelio Nest, DO  aspirin  325 MG EC tablet Take 1 tablet (325 mg total) by mouth daily. Please hold for 15 days when you are on Eliquis , resume on 10/11 once you are off Eliquis . 04/16/20   Elgergawy, Brayton RAMAN, MD  Esomeprazole  Magnesium  20 MG TBEC Take 20 mg by mouth daily at 12 noon.    [provider]  nitroGLYCERIN  (NITROSTAT ) 0.4 MG SL tablet Place 1 tablet (0.4 mg total) under the tongue every 5 (five) minutes as needed for chest pain.  09/04/20   Court Dorn PARAS, MD  phenylephrine  (SUDAFED PE) 10 MG TABS tablet Take 10 mg by mouth every 4 (four) hours as needed (sinus congestion).    [provider]  rosuvastatin (CRESTOR) 20 MG tablet Take 20 mg by mouth at bedtime. 04/09/22   [provider]    Allergies: Lipitor [atorvastatin calcium] and Amoxicillin     Review of Systems  Updated Vital Signs BP (!) 157/76 (BP Location: Left Arm)   Pulse 85   Temp (!) 97.3 F (36.3 C) (Oral)   Resp 17   Ht 5' 10 (1.778 m)   Wt 71.2 kg   SpO2 99%   BMI 22.52 kg/m   Physical Exam Vitals and nursing note reviewed.  Constitutional:      General: He is not in acute distress.    Appearance: He is well-developed. He is not diaphoretic.  HENT:     Head: Normocephalic and atraumatic.  Eyes:     Conjunctiva/sclera: Conjunctivae normal.  Cardiovascular:     Rate and Rhythm: Normal rate and regular rhythm.     Heart sounds: Normal heart sounds. No murmur heard.    No friction rub. No gallop.  Pulmonary:     Effort: Pulmonary effort is normal. No respiratory distress.     Breath sounds: Normal breath sounds. No wheezing or rales.  Chest:     Chest wall: Tenderness: deferred given known rib fx at time of exam, assumed tenderness to this area given history provided and ct.  Abdominal:     General: There is no distension.     Palpations: Abdomen is soft.     Tenderness: There is no abdominal tenderness. There is no guarding.  Musculoskeletal:     Cervical back: Normal range of motion.  Skin:    General: Skin is warm and dry.  Neurological:     Mental Status: He is alert and oriented to person, place, and time.     (all labs ordered are listed, but only abnormal results are displayed) Labs Reviewed  BASIC METABOLIC PANEL WITH GFR - Abnormal; Notable for the following components:      Result Value   Sodium 134 (*)    Glucose, Bld 103 (*)    All other components within normal limits  CBC WITH  DIFFERENTIAL/PLATELET - Abnormal; Notable for the following components:   HCT 38.6 (*)    All other components within normal limits    EKG: EKG Interpretation Date/Time:  Wednesday August 10 2024 18:14:41 EST Ventricular Rate:  86 PR Interval:  166 QRS Duration:  140 QT Interval:  408 QTC Calculation: 488 R Axis:   125  Text Interpretation: Sinus rhythm with occasional Premature ventricular complexes Right bundle branch block Lateral infarct , age undetermined Possible Inferior infarct , age undetermined Abnormal ECG When compared with ECG of 27-Apr-2023 10:01, No significant change since last tracing Confirmed by Dreama Longs ([id number]) on 08/11/2024 8:30:24 AM  Radiology: CT Chest Wo Contrast Result Date: 08/10/2024 CLINICAL DATA:  Shortness of breath. Possible pneumothorax on radiograph. Review of radiologic records indicates history of lung cancer. EXAM: CT CHEST WITHOUT CONTRAST TECHNIQUE: Multidetector CT imaging of the chest was performed following the standard protocol without IV contrast. RADIATION DOSE REDUCTION: This exam was performed according to the departmental dose-optimization program which includes automated exposure control, adjustment of the mA and/or kV according to patient size and/or use of iterative reconstruction technique. COMPARISON:  Chest CT 10/26/2020. The referenced radiographs not available for review. FINDINGS: Cardiovascular: The heart is upper normal in size. There are coronary artery calcifications. Aortic atherosclerosis and tortuosity. No aortic aneurysm. No pericardial effusion. Mediastinum/Nodes: Shotty mediastinal lymph nodes including a 10 mm anterior paratracheal node. Assessment for hilar adenopathy is limited in the absence of IV contrast. Small hiatal hernia minimal wall thickening of the distal esophagus. Lungs/Pleura: No pneumothorax. Mild emphysema. Postsurgical change in the right hemithorax with chain sutures in the mid lung. Significant  thickening and narrowing of the right lower lobe bronchus with areas of bronchial filling and mucoid impaction in the right lower lobe. There are tree-in-bud opacities in the right lower lobe. Areas of nodular consolidation in the right lower lobe dependently, 2.3 cm series 4, image 122, 2.6 cm more medially series 4 image 125. Left lower lobe bronchial thickening with areas of mucoid impaction. Tiny pulmonary nodules on prior are stable, including right upper lobe series 4, images 44 and 45. Pleural thickening in the right hemithorax without significant effusion. Upper Abdomen: Gallstone in the gallbladder. Abdominal aortic atherosclerosis. No acute upper abdominal findings. Musculoskeletal: Nondisplaced fractures right anterior sixth and seventh ribs appear acute. Postsurgical change of right lower ribs. Diffuse thoracic spondylosis. Scarring in the lower right chest wall. IMPRESSION: 1. No pneumothorax. 2. Significant bronchial thickening and narrowing of the right lower lobe bronchus with areas of bronchial filling and mucoid impaction. Diffuse tree-in-bud opacities in the right lower lobe. Findings are typical of infection. There are nodular areas of consolidation in the dependent right lower lobe, nonspecific in the setting of prior malignancy. These are favored to also be infectious, however recommend close radiologic follow-up. CT is recommended in 3-4 weeks. 3. Nondisplaced fractures of the right anterior sixth and seventh ribs appear acute. 4. Small hiatal hernia with minimal wall thickening of the distal esophagus, can be seen with reflux or esophagitis. 5. Cholelithiasis. Aortic Atherosclerosis (ICD10-I70.0) and Emphysema (ICD10-J43.9). Electronically Signed   By: Andrea Gasman M.D.   On: 08/10/2024 22:30   CT Maxillofacial Wo Contrast Result Date: 08/10/2024 CLINICAL DATA:  Sinus congestion EXAM: CT MAXILLOFACIAL WITHOUT CONTRAST TECHNIQUE: Multidetector CT imaging of the maxillofacial structures  was performed. Multiplanar CT image reconstructions were also generated. RADIATION DOSE REDUCTION: This exam was performed according to the departmental dose-optimization program which includes automated exposure control, adjustment of the mA and/or kV according to patient size and/or use of iterative reconstruction technique. COMPARISON:  None Available. FINDINGS: Osseous: No fracture or mandibular dislocation. No destructive process. Orbits: Negative. No traumatic or inflammatory finding. Sinuses: Left maxillary mucoperiosteal thickening with superimposed gas fluid level. Partial opacification of the left ethmoid air cells. Soft tissues: Negative. Limited intracranial: No significant or unexpected  finding. IMPRESSION: 1. Acute left maxillary sinusitis. Minimal left ethmoid sinus disease. 2. No acute bony abnormality. Electronically Signed   By: Ozell Daring M.D.   On: 08/10/2024 22:21     Procedures   Medications Ordered in the ED - No data to display  Clinical Course as of 08/11/24 1008  Wed Aug 10, 2024  2315 Likely PNA  [CC]    Clinical Course User Index [CC] Jerral Meth, MD                                  84 year old male with a history of hypertension, hyperlipidemia, coronary artery disease, asthma, lung cancer, who presents with concern for congestion with difficulty breathing, fall with mild right rib pain and possible collapsed lung on XR with PCP.   Labs completed and personally about interpreted by me show mild hypokalemia, no leukocytosis or anemia.  He is not having chest pain or shortness of breath with exception of sensation of nasal congestion.  Regarding his nasal congestion: This has been ongoing and he has seen 2 different ENT providers.  They have offered him surgery, however he is anxious regarding the recovery time.  In the setting of this sensation of congestion, he has began to become more depressed and anxious.  He has made some self harm statements, like  having someone beat his face in with a baseball bat, however he and his daughter do not feel he is an acute threat to himself. They do feel desperate to find him outpatient psychiatry and have had difficulty finding availability.   CT maxillofacial was obtained in the setting of these ongoing symptoms despite treatment with ENT, and shows a an acute left maxillary sinusitis and minimal left ethmoid sinus disease.  He has an allergy to amoxicillin .  Will be prescribing antibiotics for this as well as a pneumonia noted on CT.  Recommend continued follow-up with his ear nose and throat physicians.  Have given him a list of resources for mental health, and number and location for behavioral health urgent care.  CT chest without contrast was done given abnormality on outpatient chest x-ray.  The CT does show nondisplaced fractures of the right anterior 6th and 7th ribs.  He does have some pain in this area, but is not significant, and given timing of injury and level of pain at this time do not feel he requires inpatient treatment.  Spirometer was ordered.  Recommend continued outpatient care for this.  His CT also shows significant bronchial thickening, tree-in-bud opacities in the right lower lobe most consistent with infection, with nodular areas in the dependent right lower lobe which is nonspecific in the setting of prior malignancy.  Will treat this area consistent with pneumonia with doxycycline  and cefpodoxime .  Recommend follow-up CT in 3 to 4 weeks in setting of prior cancer history.  He is also noted to have hiatal hernia, findings of possible esophagitis, cholelithiasis.  Given prescription for antibiotics, recommend continued outpatient follow-up with his PCP and ENT, outpt psychiatry, and repeat imaging as discussed.      Final diagnoses:  Pneumonia of right lower lobe due to infectious organism  Closed fracture of multiple ribs of right side, initial encounter  Left maxillary sinusitis   Situational depression    ED Discharge Orders          Ordered    doxycycline  (VIBRAMYCIN ) 100 MG capsule  2 times daily  08/11/24 0848    cefpodoxime  (VANTIN ) 200 MG tablet  2 times daily        08/11/24 9151               Dreama Longs, MD 08/11/24 1009  "

## 2024-08-11 NOTE — ED Notes (Signed)
 Pt has two (2) personal belonging bags with label. Given to family member
